# Patient Record
Sex: Female | Born: 1950 | Race: Black or African American | Hispanic: No | Smoking: Former smoker
Health system: Southern US, Community
[De-identification: ages and names within clinical notes are randomized; demographics above are authoritative.]

## PROBLEM LIST (undated history)

## (undated) DIAGNOSIS — I2510 Atherosclerotic heart disease of native coronary artery without angina pectoris: Secondary | ICD-10-CM

## (undated) DIAGNOSIS — I5030 Unspecified diastolic (congestive) heart failure: Secondary | ICD-10-CM

## (undated) DIAGNOSIS — I493 Ventricular premature depolarization: Secondary | ICD-10-CM

## (undated) DIAGNOSIS — Z91148 Patient's other noncompliance with medication regimen for other reason: Secondary | ICD-10-CM

## (undated) DIAGNOSIS — G8929 Other chronic pain: Secondary | ICD-10-CM

## (undated) DIAGNOSIS — G459 Transient cerebral ischemic attack, unspecified: Secondary | ICD-10-CM

## (undated) DIAGNOSIS — I119 Hypertensive heart disease without heart failure: Secondary | ICD-10-CM

## (undated) DIAGNOSIS — I209 Angina pectoris, unspecified: Secondary | ICD-10-CM

## (undated) DIAGNOSIS — K219 Gastro-esophageal reflux disease without esophagitis: Secondary | ICD-10-CM

## (undated) DIAGNOSIS — Z860100 Personal history of colon polyps, unspecified: Secondary | ICD-10-CM

## (undated) DIAGNOSIS — T8859XA Other complications of anesthesia, initial encounter: Secondary | ICD-10-CM

## (undated) DIAGNOSIS — U071 COVID-19: Secondary | ICD-10-CM

## (undated) DIAGNOSIS — I5189 Other ill-defined heart diseases: Secondary | ICD-10-CM

## (undated) DIAGNOSIS — N951 Menopausal and female climacteric states: Secondary | ICD-10-CM

## (undated) DIAGNOSIS — Z7902 Long term (current) use of antithrombotics/antiplatelets: Secondary | ICD-10-CM

## (undated) DIAGNOSIS — Z201 Contact with and (suspected) exposure to tuberculosis: Secondary | ICD-10-CM

## (undated) DIAGNOSIS — I700 Atherosclerosis of aorta: Secondary | ICD-10-CM

## (undated) DIAGNOSIS — E785 Hyperlipidemia, unspecified: Secondary | ICD-10-CM

## (undated) DIAGNOSIS — I77810 Thoracic aortic ectasia: Secondary | ICD-10-CM

## (undated) DIAGNOSIS — I10 Essential (primary) hypertension: Secondary | ICD-10-CM

## (undated) DIAGNOSIS — Z9119 Patient's noncompliance with other medical treatment and regimen: Secondary | ICD-10-CM

## (undated) DIAGNOSIS — G4730 Sleep apnea, unspecified: Secondary | ICD-10-CM

## (undated) DIAGNOSIS — N8110 Cystocele, unspecified: Secondary | ICD-10-CM

## (undated) DIAGNOSIS — R7303 Prediabetes: Secondary | ICD-10-CM

## (undated) DIAGNOSIS — I480 Paroxysmal atrial fibrillation: Secondary | ICD-10-CM

## (undated) DIAGNOSIS — Z91199 Patient's noncompliance with other medical treatment and regimen due to unspecified reason: Secondary | ICD-10-CM

## (undated) DIAGNOSIS — R7309 Other abnormal glucose: Secondary | ICD-10-CM

## (undated) DIAGNOSIS — D649 Anemia, unspecified: Secondary | ICD-10-CM

## (undated) DIAGNOSIS — E559 Vitamin D deficiency, unspecified: Secondary | ICD-10-CM

## (undated) DIAGNOSIS — G4733 Obstructive sleep apnea (adult) (pediatric): Secondary | ICD-10-CM

## (undated) HISTORY — DX: Morbid (severe) obesity due to excess calories: E66.01

## (undated) HISTORY — PX: CORONARY ANGIOPLASTY WITH STENT PLACEMENT: SHX49

## (undated) HISTORY — DX: Atherosclerotic heart disease of native coronary artery without angina pectoris: I25.10

## (undated) HISTORY — DX: Hyperlipidemia, unspecified: E78.5

## (undated) HISTORY — DX: COVID-19: U07.1

## (undated) HISTORY — PX: TUBAL LIGATION: SHX77

## (undated) HISTORY — DX: Patient's noncompliance with other medical treatment and regimen due to unspecified reason: Z91.199

## (undated) HISTORY — DX: Essential (primary) hypertension: I10

## (undated) HISTORY — DX: Patient's noncompliance with other medical treatment and regimen: Z91.19

## (undated) HISTORY — DX: Other ill-defined heart diseases: I51.89

## (undated) HISTORY — DX: Menopausal and female climacteric states: N95.1

## (undated) HISTORY — DX: Hypertensive heart disease without heart failure: I11.9

## (undated) HISTORY — DX: Sleep apnea, unspecified: G47.30

---

## 1989-04-30 HISTORY — PX: KNEE ARTHROSCOPY: SHX127

## 2002-08-30 DIAGNOSIS — I219 Acute myocardial infarction, unspecified: Secondary | ICD-10-CM

## 2002-08-30 HISTORY — DX: Acute myocardial infarction, unspecified: I21.9

## 2003-08-24 ENCOUNTER — Other Ambulatory Visit: Payer: Self-pay

## 2003-08-24 DIAGNOSIS — I2510 Atherosclerotic heart disease of native coronary artery without angina pectoris: Secondary | ICD-10-CM

## 2003-08-24 DIAGNOSIS — I214 Non-ST elevation (NSTEMI) myocardial infarction: Secondary | ICD-10-CM

## 2003-08-24 HISTORY — DX: Atherosclerotic heart disease of native coronary artery without angina pectoris: I25.10

## 2003-08-24 HISTORY — DX: Non-ST elevation (NSTEMI) myocardial infarction: I21.4

## 2003-08-25 ENCOUNTER — Other Ambulatory Visit: Payer: Self-pay

## 2003-08-26 ENCOUNTER — Other Ambulatory Visit: Payer: Self-pay

## 2003-08-26 DIAGNOSIS — Z955 Presence of coronary angioplasty implant and graft: Secondary | ICD-10-CM

## 2003-08-26 HISTORY — PX: CORONARY ANGIOPLASTY WITH STENT PLACEMENT: SHX49

## 2003-08-26 HISTORY — PX: LEFT HEART CATH AND CORONARY ANGIOGRAPHY: CATH118249

## 2003-08-26 HISTORY — DX: Presence of coronary angioplasty implant and graft: Z95.5

## 2003-08-29 ENCOUNTER — Other Ambulatory Visit: Payer: Self-pay

## 2003-09-22 ENCOUNTER — Other Ambulatory Visit: Payer: Self-pay

## 2003-09-23 ENCOUNTER — Other Ambulatory Visit: Payer: Self-pay

## 2003-09-23 HISTORY — PX: CORONARY ANGIOPLASTY WITH STENT PLACEMENT: SHX49

## 2003-09-23 HISTORY — PX: LEFT HEART CATH AND CORONARY ANGIOGRAPHY: CATH118249

## 2003-09-28 ENCOUNTER — Other Ambulatory Visit: Payer: Self-pay

## 2003-11-06 ENCOUNTER — Other Ambulatory Visit: Payer: Self-pay

## 2004-03-06 ENCOUNTER — Other Ambulatory Visit: Payer: Self-pay

## 2004-10-12 ENCOUNTER — Ambulatory Visit: Payer: Self-pay | Admitting: Ophthalmology

## 2005-07-08 ENCOUNTER — Other Ambulatory Visit: Payer: Self-pay

## 2005-07-08 ENCOUNTER — Inpatient Hospital Stay: Payer: Self-pay

## 2005-08-26 ENCOUNTER — Ambulatory Visit: Payer: Self-pay | Admitting: Cardiovascular Disease

## 2005-08-26 HISTORY — PX: LEFT HEART CATH AND CORONARY ANGIOGRAPHY: CATH118249

## 2008-11-01 HISTORY — PX: LEFT HEART CATH AND CORONARY ANGIOGRAPHY: CATH118249

## 2009-02-17 ENCOUNTER — Ambulatory Visit: Payer: Self-pay | Admitting: Cardiovascular Disease

## 2009-02-17 HISTORY — PX: LEFT HEART CATH AND CORONARY ANGIOGRAPHY: CATH118249

## 2010-09-12 ENCOUNTER — Emergency Department: Payer: Self-pay | Admitting: Emergency Medicine

## 2011-08-18 ENCOUNTER — Ambulatory Visit: Payer: Self-pay

## 2014-07-18 ENCOUNTER — Ambulatory Visit: Payer: Self-pay | Admitting: Gastroenterology

## 2014-10-03 ENCOUNTER — Ambulatory Visit: Payer: Self-pay | Admitting: Family Medicine

## 2014-10-03 DIAGNOSIS — Z1231 Encounter for screening mammogram for malignant neoplasm of breast: Secondary | ICD-10-CM | POA: Diagnosis not present

## 2015-02-11 ENCOUNTER — Ambulatory Visit (INDEPENDENT_AMBULATORY_CARE_PROVIDER_SITE_OTHER): Payer: Commercial Managed Care - HMO | Admitting: Family Medicine

## 2015-02-11 ENCOUNTER — Encounter: Payer: Self-pay | Admitting: Family Medicine

## 2015-02-11 VITALS — BP 138/88 | HR 66 | Temp 98.6°F | Ht 62.4 in | Wt 227.0 lb

## 2015-02-11 DIAGNOSIS — N8110 Cystocele, unspecified: Secondary | ICD-10-CM | POA: Diagnosis not present

## 2015-02-11 DIAGNOSIS — I10 Essential (primary) hypertension: Secondary | ICD-10-CM

## 2015-02-11 NOTE — Progress Notes (Signed)
   BP 138/88 mmHg  Pulse 66  Temp(Src) 98.6 F (37 C)  Ht 5' 2.4" (1.585 m)  Wt 227 lb (102.967 kg)  BMI 40.99 kg/m2  SpO2 98%   Subjective:    Patient ID: [name], female    DOB: Mar 26, 1951, 64 y.o.   MRN: [id number]  HPI: MELLINA KASSAY is a 64 y.o. female  Chief Complaint  Patient presents with  . bladder protruding   no UTI sx Ongoing all year Sticking out more And gets uncomfortable Discussed HTN stable with meds Relevant past medical, surgical, family and social history reviewed and updated as indicated. Interim medical history since our last visit reviewed. Allergies and medications reviewed and updated.  Review of Systems  Constitutional: Negative.   Respiratory: Negative.   Cardiovascular: Negative.     Per HPI unless specifically indicated above     Objective:    BP 138/88 mmHg  Pulse 66  Temp(Src) 98.6 F (37 C)  Ht 5' 2.4" (1.585 m)  Wt 227 lb (102.967 kg)  BMI 40.99 kg/m2  SpO2 98%  Wt Readings from Last 3 Encounters:  02/11/15 227 lb (102.967 kg)  09/17/14 223 lb (101.152 kg)    Physical Exam  Constitutional: She is oriented to person, place, and time. She appears well-developed and well-nourished. No distress.  HENT:  Head: Normocephalic and atraumatic.  Right Ear: Hearing normal.  Left Ear: Hearing normal.  Nose: Nose normal.  Eyes: Conjunctivae and lids are normal. Right eye exhibits no discharge. Left eye exhibits no discharge. No scleral icterus.  Pulmonary/Chest: Effort normal. No respiratory distress.  Genitourinary: No erythema in the vagina.  Prolapsed bladder  Musculoskeletal: Normal range of motion.  Neurological: She is alert and oriented to person, place, and time.  Skin: Skin is intact. No rash noted.  Psychiatric: She has a normal mood and affect. Her speech is normal and behavior is normal. Judgment and thought content normal. Cognition and memory are normal.    No results found for this or any previous visit.    Assessment & Plan:   Problem List Items Addressed This Visit      Cardiovascular and Mediastinum   Hypertension    .Marland KitchenMarland KitchenMarland KitchenThe current medical regimen is effective;  continue present plan and medications.         Genitourinary   Bladder prolapse, female, acquired - Primary    Discuss bladder care and tx Due to distress causing pt will make referral to Gyn      Relevant Orders   Ambulatory referral to Gynecology       Follow up plan: Return for Physical Exam.

## 2015-02-11 NOTE — Assessment & Plan Note (Signed)
Discuss bladder care and tx Due to distress causing pt will make referral to [facility]

## 2015-02-11 NOTE — Assessment & Plan Note (Signed)
The current medical regimen is effective;  continue present plan and medications.  

## 2015-02-20 DIAGNOSIS — H2513 Age-related nuclear cataract, bilateral: Secondary | ICD-10-CM | POA: Diagnosis not present

## 2015-02-20 DIAGNOSIS — H349 Unspecified retinal vascular occlusion: Secondary | ICD-10-CM | POA: Diagnosis not present

## 2015-02-27 ENCOUNTER — Encounter: Payer: Self-pay | Admitting: Family Medicine

## 2015-03-13 ENCOUNTER — Ambulatory Visit (INDEPENDENT_AMBULATORY_CARE_PROVIDER_SITE_OTHER): Payer: Commercial Managed Care - HMO | Admitting: Obstetrics and Gynecology

## 2015-03-13 ENCOUNTER — Encounter: Payer: Self-pay | Admitting: Obstetrics and Gynecology

## 2015-03-13 VITALS — BP 134/88 | HR 73 | Ht 63.0 in | Wt 223.1 lb

## 2015-03-13 DIAGNOSIS — N8110 Cystocele, unspecified: Secondary | ICD-10-CM | POA: Diagnosis not present

## 2015-03-14 DIAGNOSIS — N8110 Cystocele, unspecified: Secondary | ICD-10-CM | POA: Insufficient documentation

## 2015-03-14 NOTE — Progress Notes (Signed)
Subjective:    Jennifer Grant is a 64 y.o. [id number] postmenopausal female who presents for evaluation of a cystocele. [facility]. Problem started 5 years ago.   Symptoms include: prolapse of tissue with straining and feeling a bulge in the vagina. Symptoms have gradually worsened.  Is currently not sexually active, however is considering becoming active at some point with new partner.  Denies urinary symptoms or incontinence.  Denies constipation or problems with defecation.    Past Medical History  Diagnosis Date  . Hyperlipidemia   . Hypertension   . CAD (coronary artery disease)   . Mitral valve disorder   . Menopausal state      Past Surgical History  Procedure Laterality Date  . Tubal ligation    . Knee surgery Left     orthoscopic  . Coronary artery bypass graft  6 stents   History  Substance Use Topics  . Smoking status: Never Smoker   . Smokeless tobacco: Never Used  . Alcohol Use: No   Current Outpatient Prescriptions on File Prior to Visit  Medication Sig Dispense Refill  . amLODipine (NORVASC) 10 MG tablet Take 10 mg by mouth daily.    Marland Kitchen. aspirin EC 81 MG tablet Take 81 mg by mouth daily.    . cephALEXin (KEFLEX) 500 MG capsule     . co-enzyme Q-10 30 MG capsule Take 30 mg by mouth daily.    . DHA-EPA-Vitamin E (OMEGA-3 COMPLEX PO) Take by mouth.    Marland Kitchen. FLAXSEED, LINSEED, PO Take by mouth daily.    . hydrochlorothiazide (HYDRODIURIL) 25 MG tablet Take 25 mg by mouth daily.    Marland Kitchen. losartan (COZAAR) 100 MG tablet Take 100 mg by mouth daily.    . metoprolol succinate (TOPROL-XL) 25 MG 24 hr tablet Take 25 mg by mouth daily.    . potassium chloride (K-DUR) 10 MEQ tablet Take 10 mEq by mouth daily.    . pravastatin (PRAVACHOL) 20 MG tablet Take 20 mg by mouth daily.     No current facility-administered medications on file prior to visit.   Allergies  Allergen Reactions  . Other Other (See Comments)    Uncoded Allergy. Allergen: pcn  .  Penicillins Rash  . Tekturna [Aliskiren] Rash     Review of Systems Pertinent items are noted in HPI.   Objective:     BP 134/88 mmHg  Pulse 73  Ht 5\' 3"  (1.6 m)  Wt 223 lb 1.6 oz (101.197 kg)  BMI 39.53 kg/m2. No LMP recorded. Patient is not currently having periods (Reason: Premenopausal).   General:  General appearance: alert and no distress  Abdomen: Abdomen: soft, non-tender; bowel sounds normal; no masses,  no organomegaly  Pelvis:  Pelvic: external genitalia normal, vagina normal without discharge and vaginal vault prolapse with moderate cystocele present.Cervix normal appearing. Uterus mobile, intact, without descensus.  Mild vaginal atrophy present     Assessment:    The patient has a moderate cystocele without uterine descensus     Plan:    Discussed cystoceles and management options with the patient, including conservative management with pessary vs surgical management with anterior repair.  Patient currently unsure of desires. Patient to f/u in 2 weeks to further discuss options.  All questions answered. Agricultural engineerducational material distributed. Recommended Kegal exercises.   Hildred LaserAnika Raynaldo Falco, MD [facility]

## 2015-03-27 ENCOUNTER — Ambulatory Visit: Payer: Commercial Managed Care - HMO | Admitting: Obstetrics and Gynecology

## 2015-04-08 ENCOUNTER — Ambulatory Visit: Payer: Commercial Managed Care - HMO | Admitting: Obstetrics and Gynecology

## 2015-04-18 ENCOUNTER — Encounter: Payer: Self-pay | Admitting: Obstetrics and Gynecology

## 2015-04-18 ENCOUNTER — Ambulatory Visit (INDEPENDENT_AMBULATORY_CARE_PROVIDER_SITE_OTHER): Payer: Commercial Managed Care - HMO | Admitting: Obstetrics and Gynecology

## 2015-04-18 VITALS — BP 173/84 | HR 67 | Ht 62.0 in | Wt 227.3 lb

## 2015-04-18 DIAGNOSIS — N8110 Cystocele, unspecified: Secondary | ICD-10-CM | POA: Diagnosis not present

## 2015-04-18 NOTE — Progress Notes (Signed)
Patient ID: Jennifer Grant, female   DOB: [birth year], 64 y.o.   MRN: [id number] 4 week f/u on cystocele Pt wants non surgical intervention

## 2015-04-19 NOTE — Progress Notes (Signed)
GYNECOLOGY PROGRESS NOTE  Subjective:    Patient ID: Jennifer Grant, female    DOB: 1950-11-16, 64 y.o.   MRN: [id number]  HPI  Patient is a 64 y.o. No obstetric history on file. female who presents for f/u of cystocele.  Patient given options of surgery vs pessary for management of cystocele last visit, desired to think over options.   The following portions of the patient's history were reviewed and updated as appropriate: allergies, current medications, past family history, past medical history, past social history, past surgical history and problem list.  Review of Systems A comprehensive review of systems was negative except for: Genitourinary: positive for vaginal bulge   Objective:   Blood pressure 173/84, pulse 67, height  (1.575 m), weight 227 lb 4.8 oz (103.103 kg). General appearance: alert and no distress Exam deferred.    Assessment:   Cystocele without uterine prolapse  Plan:   Patient desires to try pessary management first.  All questions answered.  Offered to perform pessary fitting today, patient declined, desires to schedule for future visit.  RTC in 2-3 weeks for pessary fitting.    Hildred Laser, MD [facility]

## 2015-05-08 ENCOUNTER — Ambulatory Visit (INDEPENDENT_AMBULATORY_CARE_PROVIDER_SITE_OTHER): Payer: Commercial Managed Care - HMO | Admitting: Obstetrics and Gynecology

## 2015-05-08 ENCOUNTER — Other Ambulatory Visit: Payer: Self-pay | Admitting: Family Medicine

## 2015-05-08 ENCOUNTER — Encounter: Payer: Self-pay | Admitting: Obstetrics and Gynecology

## 2015-05-08 VITALS — BP 152/87 | HR 61 | Ht 62.0 in | Wt 225.9 lb

## 2015-05-08 DIAGNOSIS — IMO0001 Reserved for inherently not codable concepts without codable children: Secondary | ICD-10-CM

## 2015-05-08 DIAGNOSIS — N8110 Cystocele, unspecified: Secondary | ICD-10-CM | POA: Diagnosis not present

## 2015-05-08 DIAGNOSIS — Z4689 Encounter for fitting and adjustment of other specified devices: Secondary | ICD-10-CM

## 2015-05-08 DIAGNOSIS — IMO0002 Reserved for concepts with insufficient information to code with codable children: Principal | ICD-10-CM

## 2015-05-08 NOTE — Progress Notes (Signed)
GYNECOLOGY PROGRESS NOTE  Subjective:    Patient ID: Jennifer Grant, female    DOB: 1951-05-22, 64 y.o.   MRN: [id number]  HPI  Patient is a 64 y.o. female who presents for vaginal pessary fitting for management of cystocele.  Denies major complaints today.   The following portions of the patient's history were reviewed and updated as appropriate: allergies, current medications, past family history, past medical history, past social history, past surgical history and problem list.  Review of Systems A comprehensive review of systems was negative except for: Genitourinary: positive for vaginal bulge   Objective:   Blood pressure 152/87, pulse 61, height  (1.575 m), weight 225 lb 14.4 oz (102.468 kg). General appearance: alert and no distress Abdomen: soft, non-tender; bowel sounds normal; no masses,  no organomegaly Pelvic: external genitalia normal, vagina normal without discharge and vaginal vault prolapse with moderate cystocele present.Cervix normal appearing. Uterus mobile, intact, without descensus. Mild vaginal atrophy present   Assessment:   Moderate cystocele  Plan:   Pessary fitting performed today.  Size 3 ring with support ordered.  RTC in 2-3 weeks for pessary insertion.   Hildred Laser, MD [facility]

## 2015-05-22 ENCOUNTER — Ambulatory Visit: Payer: Commercial Managed Care - HMO | Admitting: Obstetrics and Gynecology

## 2015-06-08 ENCOUNTER — Encounter (HOSPITAL_COMMUNITY)
Admission: EM | Disposition: A | Discharge status: Home / Self Care | Payer: Self-pay | Source: Home / Self Care | Attending: Interventional Cardiology

## 2015-06-08 ENCOUNTER — Encounter (HOSPITAL_COMMUNITY): Payer: Self-pay | Admitting: *Deleted

## 2015-06-08 ENCOUNTER — Inpatient Hospital Stay (HOSPITAL_COMMUNITY)
Admission: EM | Admit: 2015-06-08 | Discharge: 2015-06-11 | DRG: 246 | Disposition: A | Discharge status: Home / Self Care | Payer: Commercial Managed Care - HMO | Attending: Interventional Cardiology | Admitting: Interventional Cardiology

## 2015-06-08 DIAGNOSIS — I10 Essential (primary) hypertension: Secondary | ICD-10-CM

## 2015-06-08 DIAGNOSIS — Z955 Presence of coronary angioplasty implant and graft: Secondary | ICD-10-CM | POA: Diagnosis not present

## 2015-06-08 DIAGNOSIS — Z79899 Other long term (current) drug therapy: Secondary | ICD-10-CM

## 2015-06-08 DIAGNOSIS — D649 Anemia, unspecified: Secondary | ICD-10-CM | POA: Diagnosis not present

## 2015-06-08 DIAGNOSIS — Z951 Presence of aortocoronary bypass graft: Secondary | ICD-10-CM

## 2015-06-08 DIAGNOSIS — Y848 Other medical procedures as the cause of abnormal reaction of the patient, or of later complication, without mention of misadventure at the time of the procedure: Secondary | ICD-10-CM | POA: Diagnosis present

## 2015-06-08 DIAGNOSIS — I509 Heart failure, unspecified: Secondary | ICD-10-CM | POA: Diagnosis not present

## 2015-06-08 DIAGNOSIS — R7303 Prediabetes: Secondary | ICD-10-CM | POA: Diagnosis present

## 2015-06-08 DIAGNOSIS — E876 Hypokalemia: Secondary | ICD-10-CM | POA: Diagnosis present

## 2015-06-08 DIAGNOSIS — I2129 ST elevation (STEMI) myocardial infarction involving other sites: Secondary | ICD-10-CM | POA: Diagnosis not present

## 2015-06-08 DIAGNOSIS — R079 Chest pain, unspecified: Secondary | ICD-10-CM | POA: Diagnosis not present

## 2015-06-08 DIAGNOSIS — I110 Hypertensive heart disease with heart failure: Secondary | ICD-10-CM | POA: Diagnosis present

## 2015-06-08 DIAGNOSIS — Z9119 Patient's noncompliance with other medical treatment and regimen: Secondary | ICD-10-CM | POA: Diagnosis not present

## 2015-06-08 DIAGNOSIS — I2510 Atherosclerotic heart disease of native coronary artery without angina pectoris: Secondary | ICD-10-CM | POA: Diagnosis not present

## 2015-06-08 DIAGNOSIS — Z6841 Body Mass Index (BMI) 40.0 and over, adult: Secondary | ICD-10-CM | POA: Diagnosis not present

## 2015-06-08 DIAGNOSIS — Z91148 Patient's other noncompliance with medication regimen for other reason: Secondary | ICD-10-CM

## 2015-06-08 DIAGNOSIS — Z9114 Patient's other noncompliance with medication regimen: Secondary | ICD-10-CM

## 2015-06-08 DIAGNOSIS — E785 Hyperlipidemia, unspecified: Secondary | ICD-10-CM | POA: Diagnosis not present

## 2015-06-08 DIAGNOSIS — I5033 Acute on chronic diastolic (congestive) heart failure: Secondary | ICD-10-CM

## 2015-06-08 DIAGNOSIS — T82855A Stenosis of coronary artery stent, initial encounter: Secondary | ICD-10-CM | POA: Diagnosis not present

## 2015-06-08 DIAGNOSIS — I2121 ST elevation (STEMI) myocardial infarction involving left circumflex coronary artery: Secondary | ICD-10-CM | POA: Diagnosis present

## 2015-06-08 DIAGNOSIS — I252 Old myocardial infarction: Secondary | ICD-10-CM | POA: Diagnosis present

## 2015-06-08 DIAGNOSIS — I25118 Atherosclerotic heart disease of native coronary artery with other forms of angina pectoris: Secondary | ICD-10-CM | POA: Diagnosis present

## 2015-06-08 HISTORY — PX: CARDIAC CATHETERIZATION: SHX172

## 2015-06-08 HISTORY — DX: ST elevation (STEMI) myocardial infarction involving other sites: I21.29

## 2015-06-08 LAB — POCT I-STAT, CHEM 8
BUN: 8 mg/dL (ref 6–20)
BUN: 7 mg/dL (ref 6–20)
CREATININE: 0.6 mg/dL (ref 0.44–1.00)
Calcium, Ion: 1.32 mmol/L — ABNORMAL HIGH (ref 1.13–1.30)
Calcium, Ion: 1.21 mmol/L (ref 1.13–1.30)
Chloride: 99 mmol/L — ABNORMAL LOW (ref 101–111)
Chloride: 99 mmol/L — ABNORMAL LOW (ref 101–111)
Creatinine, Ser: 0.7 mg/dL (ref 0.44–1.00)
GLUCOSE: 114 mg/dL — AB (ref 65–99)
Glucose, Bld: 140 mg/dL — ABNORMAL HIGH (ref 65–99)
HEMATOCRIT: 41 % (ref 36.0–46.0)
HEMATOCRIT: 39 % (ref 36.0–46.0)
HEMOGLOBIN: 13.9 g/dL (ref 12.0–15.0)
HEMOGLOBIN: 13.3 g/dL (ref 12.0–15.0)
POTASSIUM: 2.9 mmol/L — AB (ref 3.5–5.1)
POTASSIUM: 3.3 mmol/L — AB (ref 3.5–5.1)
SODIUM: 139 mmol/L (ref 135–145)
Sodium: 138 mmol/L (ref 135–145)
TCO2: 29 mmol/L (ref 0–100)
TCO2: 26 mmol/L (ref 0–100)

## 2015-06-08 LAB — CBC WITH DIFFERENTIAL/PLATELET
BASOS ABS: 0 10*3/uL (ref 0.0–0.1)
Basophils Relative: 0 %
EOS ABS: 0 10*3/uL (ref 0.0–0.7)
Eosinophils Relative: 0 %
HEMATOCRIT: 43.7 % (ref 36.0–46.0)
HEMOGLOBIN: 14.1 g/dL (ref 12.0–15.0)
Lymphocytes Relative: 19 %
Lymphs Abs: 1.7 10*3/uL (ref 0.7–4.0)
MCH: 25.9 pg — ABNORMAL LOW (ref 26.0–34.0)
MCHC: 32.3 g/dL (ref 30.0–36.0)
MCV: 80.2 fL (ref 78.0–100.0)
MONO ABS: 0.3 10*3/uL (ref 0.1–1.0)
MONOS PCT: 3 %
NEUTROS ABS: 6.9 10*3/uL (ref 1.7–7.7)
NEUTROS PCT: 78 %
Platelets: 276 10*3/uL (ref 150–400)
RBC: 5.45 MIL/uL — ABNORMAL HIGH (ref 3.87–5.11)
RDW: 15.5 % (ref 11.5–15.5)
WBC: 8.9 10*3/uL (ref 4.0–10.5)

## 2015-06-08 LAB — COMPREHENSIVE METABOLIC PANEL
ALK PHOS: 63 U/L (ref 38–126)
ALT: 43 U/L (ref 14–54)
AST: 190 U/L — AB (ref 15–41)
Albumin: 3.5 g/dL (ref 3.5–5.0)
Anion gap: 12 (ref 5–15)
BILIRUBIN TOTAL: 0.6 mg/dL (ref 0.3–1.2)
BUN: 7 mg/dL (ref 6–20)
CHLORIDE: 97 mmol/L — AB (ref 101–111)
CO2: 26 mmol/L (ref 22–32)
Calcium: 9.4 mg/dL (ref 8.9–10.3)
Creatinine, Ser: 0.77 mg/dL (ref 0.44–1.00)
GFR calc Af Amer: >60 mL/min (ref >60)
GLUCOSE: 93 mg/dL (ref 65–99)
Potassium: 3.5 mmol/L (ref 3.5–5.1)
Sodium: 135 mmol/L (ref 135–145)
Total Protein: 8.2 g/dL — ABNORMAL HIGH (ref 6.5–8.1)

## 2015-06-08 LAB — TSH: TSH: 1.351 u[IU]/mL (ref 0.350–4.500)

## 2015-06-08 LAB — BRAIN NATRIURETIC PEPTIDE: B NATRIURETIC PEPTIDE 5: 34.1 pg/mL (ref 0.0–100.0)

## 2015-06-08 LAB — TROPONIN I

## 2015-06-08 LAB — POCT ACTIVATED CLOTTING TIME
ACTIVATED CLOTTING TIME: 202 s
ACTIVATED CLOTTING TIME: 411 s

## 2015-06-08 LAB — MRSA PCR SCREENING: MRSA BY PCR: NEGATIVE

## 2015-06-08 LAB — T4, FREE: FREE T4: 0.76 ng/dL (ref 0.61–1.12)

## 2015-06-08 LAB — MAGNESIUM: Magnesium: 2 mg/dL (ref 1.7–2.4)

## 2015-06-08 SURGERY — LEFT HEART CATH AND CORONARY ANGIOGRAPHY

## 2015-06-08 MED ORDER — OXYCODONE-ACETAMINOPHEN 5-325 MG PO TABS
1.0000 | ORAL_TABLET | ORAL | Status: DC | PRN
  Administered 2015-06-08 – 2015-06-09 (×2): 1 via ORAL
  Filled 2015-06-08 (×2): qty 1

## 2015-06-08 MED ORDER — HYDROCHLOROTHIAZIDE 25 MG PO TABS: 12.5000 mg | ORAL_TABLET | Freq: Every day | ORAL | Status: DC

## 2015-06-08 MED ORDER — VERAPAMIL HCL 2.5 MG/ML IV SOLN
INTRAVENOUS | Status: AC
  Filled 2015-06-08: qty 2

## 2015-06-08 MED ORDER — SODIUM CHLORIDE 0.9 % IJ SOLN
3.0000 mL | Freq: Two times a day (BID) | INTRAMUSCULAR | Status: DC
  Administered 2015-06-09: 09:00:00 via INTRAVENOUS

## 2015-06-08 MED ORDER — LOSARTAN POTASSIUM 50 MG PO TABS: 100.0000 mg | ORAL_TABLET | Freq: Every day | ORAL | Status: DC

## 2015-06-08 MED ORDER — NITROGLYCERIN 1 MG/10 ML FOR IR/CATH LAB
INTRA_ARTERIAL | Status: DC | PRN
  Administered 2015-06-08 (×2): 200 ug via INTRACORONARY

## 2015-06-08 MED ORDER — METOPROLOL TARTRATE 12.5 MG HALF TABLET
12.5000 mg | ORAL_TABLET | Freq: Two times a day (BID) | ORAL | Status: DC
  Administered 2015-06-08 – 2015-06-09 (×3): 12.5 mg via ORAL
  Filled 2015-06-08 (×3): qty 1

## 2015-06-08 MED ORDER — ENOXAPARIN SODIUM 40 MG/0.4ML ~~LOC~~ SOLN
40.0000 mg | SUBCUTANEOUS | Status: DC
Start: 2015-06-09 — End: 2015-06-11
  Administered 2015-06-09 – 2015-06-11 (×3): 40 mg via SUBCUTANEOUS
  Filled 2015-06-08 (×3): qty 0.4

## 2015-06-08 MED ORDER — SODIUM CHLORIDE 0.9 % IV SOLN
250.0000 mg | INTRAVENOUS | Status: DC | PRN
  Administered 2015-06-08 (×2): 1.75 mg/kg/h via INTRAVENOUS

## 2015-06-08 MED ORDER — SODIUM CHLORIDE 0.9 % IV SOLN
INTRAVENOUS | Status: AC
  Administered 2015-06-08: 18:00:00 via INTRAVENOUS

## 2015-06-08 MED ORDER — ATORVASTATIN CALCIUM 80 MG PO TABS
80.0000 mg | ORAL_TABLET | Freq: Every day | ORAL | Status: DC
  Administered 2015-06-08 – 2015-06-10 (×3): 80 mg via ORAL
  Filled 2015-06-08 (×3): qty 1

## 2015-06-08 MED ORDER — BIVALIRUDIN BOLUS VIA INFUSION
0.1000 mg/kg | Freq: Once | INTRAVENOUS | Status: DC
Start: 2015-06-08 — End: 2015-06-09
  Filled 2015-06-08: qty 11

## 2015-06-08 MED ORDER — ATORVASTATIN CALCIUM 80 MG PO TABS
80.0000 mg | ORAL_TABLET | Freq: Every day | ORAL | Status: DC
Start: 2015-06-08 — End: 2015-06-08

## 2015-06-08 MED ORDER — TICAGRELOR 90 MG PO TABS
90.0000 mg | ORAL_TABLET | Freq: Two times a day (BID) | ORAL | Status: DC
  Administered 2015-06-08 – 2015-06-11 (×6): 90 mg via ORAL
  Filled 2015-06-08 (×6): qty 1

## 2015-06-08 MED ORDER — SODIUM CHLORIDE 0.9 % IV SOLN
INTRAVENOUS | Status: DC | PRN
  Administered 2015-06-08: 102 mL/h via INTRAVENOUS

## 2015-06-08 MED ORDER — NITROGLYCERIN IN D5W 200-5 MCG/ML-% IV SOLN
0.0000 ug/min | INTRAVENOUS | Status: DC
  Administered 2015-06-08: 10 ug/min via INTRAVENOUS
  Filled 2015-06-08: qty 250

## 2015-06-08 MED ORDER — SODIUM CHLORIDE 0.9 % WEIGHT BASED INFUSION: 3.0000 mL/kg/h | INTRAVENOUS | Status: DC

## 2015-06-08 MED ORDER — AMLODIPINE BESYLATE 5 MG PO TABS: 10.0000 mg | ORAL_TABLET | Freq: Every day | ORAL | Status: DC

## 2015-06-08 MED ORDER — SODIUM CHLORIDE 0.9 % IV SOLN
1.7500 mg/kg/h | INTRAVENOUS | Status: AC
  Filled 2015-06-08: qty 250

## 2015-06-08 MED ORDER — LIDOCAINE HCL (PF) 1 % IJ SOLN
INTRAMUSCULAR | Status: AC
  Filled 2015-06-08: qty 30

## 2015-06-08 MED ORDER — ACETAMINOPHEN 325 MG PO TABS: 650.0000 mg | ORAL_TABLET | ORAL | Status: DC | PRN

## 2015-06-08 MED ORDER — FENTANYL CITRATE (PF) 100 MCG/2ML IJ SOLN
INTRAMUSCULAR | Status: DC | PRN
  Administered 2015-06-08: 25 ug via INTRAVENOUS
  Administered 2015-06-08: 50 ug via INTRAVENOUS

## 2015-06-08 MED ORDER — SODIUM CHLORIDE 0.9 % IV SOLN
250.0000 mL | INTRAVENOUS | Status: DC | PRN
  Administered 2015-06-08: 250 mL via INTRAVENOUS

## 2015-06-08 MED ORDER — MIDAZOLAM HCL 2 MG/2ML IJ SOLN
INTRAMUSCULAR | Status: DC | PRN
  Administered 2015-06-08 (×2): 1 mg via INTRAVENOUS

## 2015-06-08 MED ORDER — VERAPAMIL HCL 2.5 MG/ML IV SOLN
INTRAVENOUS | Status: DC | PRN
  Administered 2015-06-08: 150 ug via INTRACORONARY

## 2015-06-08 MED ORDER — TICAGRELOR 90 MG PO TABS
ORAL_TABLET | ORAL | Status: DC | PRN
  Administered 2015-06-08: 180 mg via ORAL

## 2015-06-08 MED ORDER — NITROGLYCERIN 1 MG/10 ML FOR IR/CATH LAB
INTRA_ARTERIAL | Status: AC
  Filled 2015-06-08: qty 10

## 2015-06-08 MED ORDER — METOPROLOL SUCCINATE ER 25 MG PO TB24: 25.0000 mg | ORAL_TABLET | Freq: Every day | ORAL | Status: DC

## 2015-06-08 MED ORDER — BIVALIRUDIN 250 MG IV SOLR
INTRAVENOUS | Status: AC
  Filled 2015-06-08: qty 250

## 2015-06-08 MED ORDER — FENTANYL CITRATE (PF) 100 MCG/2ML IJ SOLN
INTRAMUSCULAR | Status: AC
  Filled 2015-06-08: qty 4

## 2015-06-08 MED ORDER — POTASSIUM CHLORIDE 20 MEQ/15ML (10%) PO SOLN
ORAL | Status: AC
  Filled 2015-06-08: qty 30

## 2015-06-08 MED ORDER — SODIUM CHLORIDE 0.9 % IJ SOLN
3.0000 mL | INTRAMUSCULAR | Status: DC | PRN
  Administered 2015-06-08: 18:00:00 via INTRAVENOUS
  Filled 2015-06-08: qty 3

## 2015-06-08 MED ORDER — HEPARIN SODIUM (PORCINE) 5000 UNIT/ML IJ SOLN
INTRAMUSCULAR | Status: AC
  Administered 2015-06-08: 4000 [IU]
  Filled 2015-06-08: qty 1

## 2015-06-08 MED ORDER — MIDAZOLAM HCL 2 MG/2ML IJ SOLN
INTRAMUSCULAR | Status: AC
  Filled 2015-06-08: qty 4

## 2015-06-08 MED ORDER — NITROGLYCERIN 0.4 MG SL SUBL: 0.4000 mg | SUBLINGUAL_TABLET | SUBLINGUAL | Status: DC | PRN

## 2015-06-08 MED ORDER — POTASSIUM CHLORIDE CRYS ER 20 MEQ PO TBCR
40.0000 meq | EXTENDED_RELEASE_TABLET | Freq: Once | ORAL | Status: AC
  Administered 2015-06-08: 40 meq via ORAL
  Filled 2015-06-08: qty 2

## 2015-06-08 MED ORDER — ASPIRIN 81 MG PO CHEW
81.0000 mg | CHEWABLE_TABLET | Freq: Every day | ORAL | Status: DC
  Administered 2015-06-09 – 2015-06-11 (×3): 81 mg via ORAL
  Filled 2015-06-08 (×3): qty 1

## 2015-06-08 MED ORDER — POTASSIUM CHLORIDE 20 MEQ/15ML (10%) PO SOLN
ORAL | Status: DC | PRN
  Administered 2015-06-08: 40 meq via ORAL

## 2015-06-08 MED ORDER — VERAPAMIL HCL 2.5 MG/ML IV SOLN
INTRAVENOUS | Status: DC | PRN
  Administered 2015-06-08: 10 mL via INTRA_ARTERIAL

## 2015-06-08 MED ORDER — ENOXAPARIN SODIUM 40 MG/0.4ML ~~LOC~~ SOLN: 40.0000 mg | SUBCUTANEOUS | Status: DC

## 2015-06-08 MED ORDER — ONDANSETRON HCL 4 MG/2ML IJ SOLN
4.0000 mg | Freq: Four times a day (QID) | INTRAMUSCULAR | Status: DC | PRN
  Administered 2015-06-08: 4 mg via INTRAVENOUS
  Filled 2015-06-08: qty 2

## 2015-06-08 MED ORDER — LIDOCAINE HCL (PF) 1 % IJ SOLN
INTRAMUSCULAR | Status: DC | PRN
  Administered 2015-06-08: 5 mL

## 2015-06-08 MED ORDER — BIVALIRUDIN BOLUS VIA INFUSION - CUPID
INTRAVENOUS | Status: DC | PRN
  Administered 2015-06-08: 76.5 mg via INTRAVENOUS

## 2015-06-08 MED ORDER — IOHEXOL 350 MG/ML SOLN
INTRAVENOUS | Status: DC | PRN
  Administered 2015-06-08: 295 mL via INTRAVENOUS

## 2015-06-08 SURGICAL SUPPLY — 19 items
BALLN EMERGE MR 2.5X12 (BALLOONS) ×3
BALLN EUPHORA RX 2.0X12 (BALLOONS) ×3
BALLN ~~LOC~~ EUPHORA RX 3.0X12 (BALLOONS) ×3
BALLOON EMERGE MR 2.5X12 (BALLOONS) ×1 IMPLANT
BALLOON EUPHORA RX 2.0X12 (BALLOONS) ×1 IMPLANT
BALLOON ~~LOC~~ EUPHORA RX 3.0X12 (BALLOONS) ×1 IMPLANT
CATH INFINITI JR4 5F (CATHETERS) ×3 IMPLANT
CATH VISTA GUIDE 6FR XB3 (CATHETERS) ×3 IMPLANT
GLIDESHEATH SLEND A-KIT 6F 22G (SHEATH) ×3 IMPLANT
KIT ENCORE 26 ADVANTAGE (KITS) ×3 IMPLANT
KIT HEART LEFT (KITS) ×3 IMPLANT
PACK CARDIAC CATHETERIZATION (CUSTOM PROCEDURE TRAY) ×3 IMPLANT
STENT PROMUS PREM MR 2.75X20 (Permanent Stent) ×3 IMPLANT
STENT PROMUS PREM MR 3.0X8 (Permanent Stent) ×3 IMPLANT
TRANSDUCER W/STOPCOCK (MISCELLANEOUS) ×3 IMPLANT
WIRE ASAHI FIELDER XT 190CM (WIRE) ×3 IMPLANT
WIRE ASAHI PROWATER 180CM (WIRE) ×3 IMPLANT
WIRE HI TORQ VERSACORE-J 145CM (WIRE) ×3 IMPLANT
WIRE SAFE-T 1.5MM-J .035X260CM (WIRE) ×3 IMPLANT

## 2015-06-08 NOTE — Progress Notes (Signed)
06/08/2015 8:54 PM Labs given to Dr. Mayford Knife, and orders received.  Critical Trop >65 given to Dr.Turner also.

## 2015-06-08 NOTE — ED Notes (Signed)
Pt undressed, placed on zoll pads and transported to Cath lab with RN [facility] and cardiologist.

## 2015-06-08 NOTE — H&P (Signed)
Jennifer Grant is a 64 y.o. female  Admit Date: 06/08/2015 Referring Physician: [facility] Primary Cardiologist: Previously Dr. Chancy Milroy, [facility]/ [facility], [facility] first exposure during STEMI Chief complaint / reason for admission: Acute lateral wall ST elevation MI  HPI: 64 year old [place] female who suddenly developed chest discomfort during church service at around 11:30 AM. She was brought immediately to the [facility] emergency room after EKG demonstrated severe ST elevation in the true lateral EKG leads 1 and aVL. In the emergency room she complained of severe 10 of 10 chest pain. She states no prior history of acute infarction but multiple prior stents. She is unable to give any details other than her recollection that 6 stents have been placed. Dr. Chancy Milroy has been her cardiologist. She has not seen him in quite some time. She is not on dual antiplatelet therapy. They is no family history of coronary disease. She denies diabetes and does not smoke.    PMH:    Past Medical History  Diagnosis Date  . Hyperlipidemia   . Hypertension   . CAD (coronary artery disease)   . Mitral valve disorder   . Menopausal state     PSH:    Past Surgical History  Procedure Laterality Date  . Tubal ligation    . Knee surgery Left     orthoscopic  . Coronary artery bypass graft  6 stents   ALLERGIES:   Other; Penicillins; and Tekturna Prior to Admit Meds:   Prescriptions prior to admission  Medication Sig Dispense Refill Last Dose  . amLODipine (NORVASC) 10 MG tablet Take 10 mg by mouth daily.   Taking  . hydrochlorothiazide (HYDRODIURIL) 25 MG tablet Take 25 mg by mouth daily.   Taking  . losartan (COZAAR) 100 MG tablet Take 100 mg by mouth daily.   Taking  . metoprolol succinate (TOPROL-XL) 25 MG 24 hr tablet Take 25 mg by mouth daily.   Taking  . potassium chloride (K-DUR) 10 MEQ tablet Take 10 mEq by mouth daily.   Taking  . [DISCONTINUED] aspirin EC 81 MG tablet Take 81 mg by mouth  daily.   Taking  . [DISCONTINUED] Capsicum, Cayenne, (CAYENNE PO) Take by mouth.   Taking  . [DISCONTINUED] cephALEXin (KEFLEX) 250 MG capsule    Taking  . [DISCONTINUED] co-enzyme Q-10 30 MG capsule Take 30 mg by mouth daily.   Taking  . [DISCONTINUED] DHA-EPA-Vitamin E (OMEGA-3 COMPLEX PO) Take by mouth.   Taking  . [DISCONTINUED] FLAXSEED, LINSEED, PO Take by mouth daily.   Taking  . [DISCONTINUED] Misc Natural Products (GRAPE SEED COMPLEX) CAPS Take by mouth.   Taking  . [DISCONTINUED] pravastatin (PRAVACHOL) 20 MG tablet TAKE ONE TABLET BY MOUTH ONCE DAILY 90 tablet 0    Family HX:    Family History  Problem Relation Age of Onset  . Cancer Neg Hx   . Diabetes Neg Hx   . Heart disease Neg Hx    Social HX:    Social History   Social History  . Marital Status: Married    Spouse Name: N/A  . Number of Children: N/A  . Years of Education: N/A   Occupational History  . Not on file.   Social History Main Topics  . Smoking status: Never Smoker   . Smokeless tobacco: Never Used  . Alcohol Use: No  . Drug Use: No  . Sexual Activity: Yes    Birth Control/ Protection: Surgical   Other Topics Concern  .  Not on file   Social History Narrative     ROS states that she has not had any medication yet today. She denies dyspnea. She has not had blood in her urine and stool. She denies ulcer disease. She is not accompanied by family.  Physical Exam: Blood pressure 118/65, pulse 32, resp. rate 12, height 5' 2"  (1.575 m), SpO2 97 %.    Obese African Heritage female very uncomfortable in the ambulance when I met her on the ramp of the emergency room. She is still wearing her Sunday church close. Skin was dry. No nail bed cyanosis or pallor. Patient is obese. Neck veins are difficult to evaluate. Carotids are 2+ bilateral. Chest is clear to auscultation and percussion. Cardiac exam revealed an S4 gallop but otherwise unremarkable. Abdomen is obese but nontender. Extremities reveal no  edema. Carotid, radial, femoral, and posterior tibial pulses are 2+ and symmetric. Neurologically intact. She speaks with a [place] accent. Somewhat drowsy from morphine.  Labs: Lab Results  Component Value Date   HGB 13.3 06/08/2015   HCT 39.0 06/08/2015    Recent Labs Lab 06/08/15 1512  NA 138  K 3.3*  CL 99*  BUN 7  CREATININE 0.60  GLUCOSE 114*   No results found for: CKTOTAL, CKMB, CKMBINDEX, TROPONINI    Radiology:  No x-ray is yet been performed  EKG:  The tracings provided by EMS reveals sinus rhythm with 2-3 mm of ST elevation in limb lead 1 and aVL with reciprocal inferior ST segment depression in 2, 3, and aVF.  ASSESSMENT: 1. Acute lateral wall ST elevation myocardial infarction 2. History of native coronary artery disease with 6 prior stents, but no data available in terms of location or date of implant. 3. Hypertension 4. Hyperlipidemia 5. Obesity, morbid  Plan:  1. The patient was counseled to undergo heart catheterization with left heart evaluation, coronary angiography, and possible percutaneous coronary intervention with stent implantation. The procedural risks and benefits were discussed in detail. The risks discussed included death, stroke, myocardial infarction, life-threatening bleeding, limb ischemia, kidney injury, allergy, and possible emergency cardiac surgery. The risk of these significant complications occurred less than 1% of the time. After discussion, the patient has agreed to proceed.  2. After stopping briefly in the emergency room we progressed to the cardiac catheterization laboratory after the entire team had arrived. Examined the patient in the emergency room prior to traveling to cath lab.  3. Multiple prior stents increased possibility of acute stent thrombosis.   Sinclair Grooms 06/08/2015 4:13 PM

## 2015-06-08 NOTE — ED Notes (Signed)
Pt clothing and purse sent to cath lab with pt.,

## 2015-06-08 NOTE — ED Notes (Signed)
Patient with approx 45 minutes of CP prior to calling EMS while sitting at church. CP described as central associated with nausea, denies sob. 10/10 pain. 170 systolic initially, and dropped to 109 after 1 nitro. Patient also given of fentanyl with no relief. 324 ASA.  [id number]. Hx of stent x 6. 20g(R)AC.

## 2015-06-09 ENCOUNTER — Inpatient Hospital Stay (HOSPITAL_COMMUNITY): Payer: Commercial Managed Care - HMO

## 2015-06-09 ENCOUNTER — Encounter (HOSPITAL_COMMUNITY): Payer: Self-pay | Admitting: Interventional Cardiology

## 2015-06-09 DIAGNOSIS — I10 Essential (primary) hypertension: Secondary | ICD-10-CM

## 2015-06-09 DIAGNOSIS — E785 Hyperlipidemia, unspecified: Secondary | ICD-10-CM

## 2015-06-09 DIAGNOSIS — Z9119 Patient's noncompliance with other medical treatment and regimen: Secondary | ICD-10-CM

## 2015-06-09 DIAGNOSIS — I509 Heart failure, unspecified: Secondary | ICD-10-CM

## 2015-06-09 LAB — CBC
HCT: 36.2 % (ref 36.0–46.0)
Hemoglobin: 11.6 g/dL — ABNORMAL LOW (ref 12.0–15.0)
MCH: 25.7 pg — ABNORMAL LOW (ref 26.0–34.0)
MCHC: 32 g/dL (ref 30.0–36.0)
MCV: 80.1 fL (ref 78.0–100.0)
PLATELETS: 200 10*3/uL (ref 150–400)
RBC: 4.52 MIL/uL (ref 3.87–5.11)
RDW: 15.6 % — AB (ref 11.5–15.5)
WBC: 7.3 10*3/uL (ref 4.0–10.5)

## 2015-06-09 LAB — TROPONIN I: Troponin I: >65 ng/mL (ref <0.031)

## 2015-06-09 LAB — BASIC METABOLIC PANEL
Anion gap: 8 (ref 5–15)
BUN: 8 mg/dL (ref 6–20)
CO2: 25 mmol/L (ref 22–32)
CREATININE: 0.82 mg/dL (ref 0.44–1.00)
Calcium: 9.4 mg/dL (ref 8.9–10.3)
Chloride: 103 mmol/L (ref 101–111)
Glucose, Bld: 127 mg/dL — ABNORMAL HIGH (ref 65–99)
Potassium: 3.8 mmol/L (ref 3.5–5.1)
SODIUM: 136 mmol/L (ref 135–145)

## 2015-06-09 LAB — LIPID PANEL
CHOL/HDL RATIO: 6.3 ratio
Cholesterol: 213 mg/dL — ABNORMAL HIGH (ref 0–200)
HDL: 34 mg/dL — ABNORMAL LOW (ref >40)
LDL Cholesterol: 160 mg/dL — ABNORMAL HIGH (ref 0–99)
Triglycerides: 97 mg/dL (ref <150)
VLDL: 19 mg/dL (ref 0–40)

## 2015-06-09 LAB — HEMOGLOBIN A1C
Hgb A1c MFr Bld: 6.2 % — ABNORMAL HIGH (ref 4.8–5.6)
MEAN PLASMA GLUCOSE: 131 mg/dL

## 2015-06-09 MED ORDER — ISOSORBIDE MONONITRATE ER 30 MG PO TB24
30.0000 mg | ORAL_TABLET | Freq: Every day | ORAL | Status: DC
  Administered 2015-06-09: 30 mg via ORAL
  Filled 2015-06-09: qty 1

## 2015-06-09 NOTE — Care Management Note (Addendum)
Case Management Note  Patient Details  NameSHAYLE DONAHOOoster MRN: [id number] Date of Birth: [birth year]  Subjective/Objective:         Adm w stemi           Action/Plan: lives w husband   Expected Discharge Date:                  Expected Discharge Plan:     In-House Referral:     Discharge planning Services     Post Acute Care Choice:    Choice offered to:     DME Arranged:    DME Agency:     HH Arranged:    HH Agency:     Status of Service:     Medicare Important Message Given:    Date Medicare IM Given:    Medicare IM give by:    Date Additional Medicare IM Given:    Additional Medicare Important Message give by:     If discussed at Long Length of Stay Meetings, dates discussed:    Additional Comments: ur review done, gave pt 30day free brilinta card. Pt states has [employer] and medicaid so she will have low copay for brilinta.  Hanley Hays, RN 06/09/2015, 8:46 AM

## 2015-06-09 NOTE — Progress Notes (Signed)
CARDIAC REHAB PHASE I   PRE:  Rate/Rhythm: 60 SR with PVCs    BP: sitting 111/76    SaO2: 99 RA  MODE:  Ambulation: 350 ft   POST:  Rate/Rhythm: 85 SR    BP: sitting 122/95     SaO2: 100 RA  Tolerated well, no c/o. Pt sts she feels well. She has been having some sx of something stabbing her in the chest for some time now. Saw her PCP but EKG good. Began ed. Pt receptive, joking. Will need continued ed. Interested in [facility] and will send referral to  [facility]. Will f/u tomorrow. [id number]   Elissa Lovett Hatley CES, ACSM 06/09/2015 10:51 AM

## 2015-06-09 NOTE — Progress Notes (Signed)
  Echocardiogram 2D Echocardiogram has been performed.  Janalyn Harder 06/09/2015, 4:21 PM

## 2015-06-09 NOTE — Progress Notes (Signed)
EKG CRITICAL VALUE     12 lead EKG performed.  Critical value noted.  Harlow Ohms, RN notified.   Mark Benecke H, CCT 06/09/2015 6:56 AM

## 2015-06-09 NOTE — Progress Notes (Addendum)
       Patient Name: AXEL MEAS Date of Encounter: 06/09/2015    SUBJECTIVE: The patient denies any ongoing chest discomfort. She denies dyspnea.  TELEMETRY:  Normal sinus rhythm with frequent ventricular ectopy yesterday which is markedly improved. Filed Vitals:   06/09/15 0800 06/09/15 0832 06/09/15 0900 06/09/15 0904  BP: 117/59  98/73 98/73  Pulse: 58  63 73  Temp:  98.3 F (36.8 C)    TempSrc:  Oral    Resp: 9  13   Height:      Weight:      SpO2: 99%  100%     Intake/Output Summary (Last 24 hours) at 06/09/15 0946 Last data filed at 06/09/15 0900  Gross per 24 hour  Intake 1334.73 ml  Output   1400 ml  Net -65.27 ml   LABS: Basic Metabolic Panel:  Recent Labs  95/28/41 1850 06/09/15 0300  NA 135 136  K 3.5 3.8  CL 97* 103  CO2 26 25  GLUCOSE 93 127*  BUN 7 8  CREATININE 0.77 0.82  CALCIUM 9.4 9.4  MG 2.0  --    CBC:  Recent Labs  06/08/15 1850 06/09/15 0300  WBC 8.9 7.3  NEUTROABS 6.9  --   HGB 14.1 11.6*  HCT 43.7 36.2  MCV 80.2 80.1  PLT 276 200   Cardiac Enzymes:  Recent Labs  06/08/15 1850 06/08/15 2110 06/09/15 0300  TROPONINI >65.00* >65.00* >65.00*   BNP    Component Value Date/Time   BNP 34.1 06/08/2015 1850    Electrocardiogram: Persistent although markedly improved ST elevation in 1 and aVL. Reciprocal ST segment depression in lead 2 and aVF.   Radiology/Studies:  No x-ray data available  Physical Exam: Blood pressure 98/73, pulse 73, temperature 98.3 F (36.8 C), temperature source Oral, resp. rate 13, height  (1.575 m), weight 220 lb 14.4 oz (100.2 kg), SpO2 100 %. Weight change:   Wt Readings from Last 3 Encounters:  06/08/15 220 lb 14.4 oz (100.2 kg)  05/08/15 225 lb 14.4 oz (102.468 kg)  04/18/15 227 lb 4.8 oz (103.103 kg)   Obese and in no distress Heart sounds are distant. No pericardial rub is heard. No murmurs heard. Neck veins are not distended. Lungs are clear.  ASSESSMENT:  1.  Coronary artery disease with history of multiple prior stents, 4 stents placed in December 2004 at [facility] in 2 additional stents placed in 2005 at [facility].. 2. Acute lateral wall myocardial infarction due to late obtuse marginal #1 stent thrombosis.. This was successfully treated with dilatation and restenting. 3. High-grade in-stent restenosis and obtuse marginal #2, treated with angioplasty at the time of acute intervention on the OM1 stent thrombosis. 4. High-grade, multifocal in-stent restenosis in the distal RCA and PDA. 5. Obesity 6. Essential hypertension   Plan:  1. Discontinue IV nitroglycerin 2. Discontinue amlodipine 3. Ambulate 4. Lifelong dual antiplatelet therapy 5. 2-D Doppler echocardiogram 6. Discharge 48-72 hours hence. 7. Will need RCA PCI from femoral approach in several weeks after she demonstrates compliance with medical therapy, especially dual antiplatelet therapy.  Selinda Eon 06/09/2015, 9:46 AM

## 2015-06-10 ENCOUNTER — Inpatient Hospital Stay (HOSPITAL_COMMUNITY): Payer: Commercial Managed Care - HMO

## 2015-06-10 DIAGNOSIS — I5033 Acute on chronic diastolic (congestive) heart failure: Secondary | ICD-10-CM

## 2015-06-10 LAB — BASIC METABOLIC PANEL
ANION GAP: 6 (ref 5–15)
BUN: 9 mg/dL (ref 6–20)
CALCIUM: 8.8 mg/dL — AB (ref 8.9–10.3)
CHLORIDE: 100 mmol/L — AB (ref 101–111)
CO2: 27 mmol/L (ref 22–32)
Creatinine, Ser: 0.74 mg/dL (ref 0.44–1.00)
GFR calc non Af Amer: >60 mL/min (ref >60)
Glucose, Bld: 108 mg/dL — ABNORMAL HIGH (ref 65–99)
Potassium: 3.5 mmol/L (ref 3.5–5.1)
Sodium: 133 mmol/L — ABNORMAL LOW (ref 135–145)

## 2015-06-10 LAB — TROPONIN I
TROPONIN I: 32.09 ng/mL — AB (ref <0.031)
Troponin I: 57.11 ng/mL (ref <0.031)
Troponin I: 34.74 ng/mL (ref <0.031)

## 2015-06-10 MED ORDER — ISOSORBIDE MONONITRATE ER 60 MG PO TB24
60.0000 mg | ORAL_TABLET | Freq: Every day | ORAL | Status: DC
  Administered 2015-06-10 – 2015-06-11 (×2): 60 mg via ORAL
  Filled 2015-06-10 (×2): qty 1

## 2015-06-10 MED ORDER — METOPROLOL TARTRATE 25 MG PO TABS
25.0000 mg | ORAL_TABLET | Freq: Two times a day (BID) | ORAL | Status: DC
  Administered 2015-06-10 – 2015-06-11 (×3): 25 mg via ORAL
  Filled 2015-06-10 (×3): qty 1

## 2015-06-10 NOTE — Progress Notes (Addendum)
Patient Name: Jennifer Grant Date of Encounter: 06/10/2015    SUBJECTIVE: The patient denies any ongoing chest discomfort. She denies dyspnea. She slept well last night.  Regarding cardiology follow-up, she is not had a primary cardiologist in greater than 7 years. She would prefer to follow-up back here in [place].  TELEMETRY:  Normal sinus rhythm with ectopy improved. Filed Vitals:   06/10/15 0100 06/10/15 0430 06/10/15 0719 06/10/15 0724  BP: 137/58 146/71  136/89  Pulse: 57 64 69   Temp: 98.1 F (36.7 C) 98.3 F (36.8 C) 98.9 F (37.2 C)   TempSrc: Oral Oral Oral   Resp:   16   Height:      Weight:      SpO2: 99% 99% 100%     Intake/Output Summary (Last 24 hours) at 06/10/15 0807 Last data filed at 06/09/15 1957  Gross per 24 hour  Intake    776 ml  Output      0 ml  Net    776 ml   LABS: Basic Metabolic Panel:  Recent Labs  08/65/78 1850 06/09/15 0300 06/10/15 0422  NA 135 136 133*  K 3.5 3.8 3.5  CL 97* 103 100*  CO2 GLUCOSE 93 127* 108*  BUN CREATININE 0.77 0.82 0.74  CALCIUM 9.4 9.4 8.8*  MG 2.0  --   --    CBC:  Recent Labs  06/08/15 1850 06/09/15 0300  WBC 8.9 7.3  NEUTROABS 6.9  --   HGB 14.1 11.6*  HCT 43.7 36.2  MCV 80.2 80.1  PLT 276 200   Cardiac Enzymes:  Recent Labs  06/09/15 1525 06/09/15 2107 06/10/15 0422  TROPONINI >65.00* >65.00* 57.11*   BNP    Component Value Date/Time   BNP 34.1 06/08/2015 1850   Echocardiogram: Severe lateral wall hypokinesis. EF preserved at 55%.  Electrocardiogram: Persistentlateral ST elevation.  Radiology/Studies:  No x-ray data available  Physical Exam: Blood pressure 136/89, pulse 69, temperature 98.9 F (37.2 C), temperature source Oral, resp. rate 16, height  (1.575 m), weight 220 lb 14.4 oz (100.2 kg), SpO2 100 %. Weight change:   Wt Readings from Last 3 Encounters:  06/08/15 220 lb 14.4 oz (100.2 kg)  05/08/15 225 lb 14.4 oz (102.468 kg)   04/18/15 227 lb 4.8 oz (103.103 kg)   Obese and in no distress Heart sounds are distant. No pericardial rub is heard. No murmurs heard.An S4 gallop is audible.  Neck veins are difficult to assess  Lungs are clear. Right radial cath site is unremarkable.  ASSESSMENT:  1. Coronary artery disease with history of multiple prior stents, 4 stents placed in December 2004 at [facility] in 2 additional stents placed in 2005 at [facility].. 2. Acute lateral wall myocardial infarction due to late obtuse marginal #1 stent thrombosis.. This was successfully treated with dilatation and restenting.No recurrence of significant angina. There was distal embolization in this territory.  3. High-grade in-stent restenosis and obtuse marginal #2, treated with angioplasty at the time of acute intervention on the OM1 stent thrombosis. 4. High-grade, multifocal in-stent restenosis in the distal RCA and PDA. This will need to be treated at a later date.  5. Obesity 6. Essential hypertension, with blood pressures increasing 7. Acute on chronic diastolic heart failure, no evidence of volume overload.  Plan:  1. Ambulate and transferred to telemetry  2. Lifelong dual antiplatelet therapy 3. Discharge tomorrow if  no complications. 4. Will need RCA PCI from femoral approach in several weeks after she demonstrates compliance with medical therapy, especially dual antiplatelet therapy. 5. Check troponin in a.m. and chest x-ray this a.m.  6. Intensified beta blocker and isosorbide therapy.  Windy Fast W 06/10/2015, 8:07 AM

## 2015-06-10 NOTE — Progress Notes (Signed)
EKG CRITICAL VALUE     12 lead EKG performed.  Critical value noted.  Alyce, RN notified.   Oda Cogan, CCT 06/10/2015 7:21 AM

## 2015-06-10 NOTE — Progress Notes (Signed)
CARDIAC REHAB PHASE I   PRE:  Rate/Rhythm: 70 SR    BP: sitting 130/86    SaO2:   MODE:  Ambulation: 500 ft   POST:  Rate/Rhythm: 102 ST    BP: sitting 150/85     SaO2:   Tolerated well. Somewhat tired after walking. Ed completed, voicing understanding. Discussed elevated A1C and diet changes. Gave videos to watch. She can walk independently. [id number]   Jennifer Grant [place] CES, ACSM 06/10/2015 12:11 PM

## 2015-06-11 ENCOUNTER — Telehealth: Payer: Self-pay | Admitting: Interventional Cardiology

## 2015-06-11 DIAGNOSIS — I5033 Acute on chronic diastolic (congestive) heart failure: Secondary | ICD-10-CM

## 2015-06-11 DIAGNOSIS — I2129 ST elevation (STEMI) myocardial infarction involving other sites: Secondary | ICD-10-CM

## 2015-06-11 DIAGNOSIS — I2510 Atherosclerotic heart disease of native coronary artery without angina pectoris: Secondary | ICD-10-CM

## 2015-06-11 LAB — BASIC METABOLIC PANEL
ANION GAP: 8 (ref 5–15)
BUN: 11 mg/dL (ref 6–20)
CALCIUM: 9 mg/dL (ref 8.9–10.3)
CHLORIDE: 100 mmol/L — AB (ref 101–111)
CO2: 27 mmol/L (ref 22–32)
Creatinine, Ser: 0.82 mg/dL (ref 0.44–1.00)
GFR calc non Af Amer: >60 mL/min (ref >60)
GLUCOSE: 93 mg/dL (ref 65–99)
POTASSIUM: 3.3 mmol/L — AB (ref 3.5–5.1)
Sodium: 135 mmol/L (ref 135–145)

## 2015-06-11 LAB — TROPONIN I: Troponin I: 23.84 ng/mL (ref <0.031)

## 2015-06-11 LAB — HEMOGLOBIN AND HEMATOCRIT, BLOOD
HEMATOCRIT: 35.3 % — AB (ref 36.0–46.0)
Hemoglobin: 11.3 g/dL — ABNORMAL LOW (ref 12.0–15.0)

## 2015-06-11 MED ORDER — POTASSIUM CHLORIDE CRYS ER 20 MEQ PO TBCR
40.0000 meq | EXTENDED_RELEASE_TABLET | Freq: Two times a day (BID) | ORAL | Status: DC
  Administered 2015-06-11: 40 meq via ORAL
  Filled 2015-06-11: qty 2

## 2015-06-11 MED ORDER — METOPROLOL TARTRATE 25 MG PO TABS: 25.0000 mg | ORAL_TABLET | Freq: Two times a day (BID) | ORAL | Status: DC

## 2015-06-11 MED ORDER — ASPIRIN 81 MG PO CHEW: 81.0000 mg | CHEWABLE_TABLET | Freq: Every day | ORAL | Status: DC

## 2015-06-11 MED ORDER — TICAGRELOR 90 MG PO TABS: 90.0000 mg | ORAL_TABLET | Freq: Two times a day (BID) | ORAL | Status: DC

## 2015-06-11 MED ORDER — NITROGLYCERIN 0.4 MG SL SUBL: 0.4000 mg | SUBLINGUAL_TABLET | SUBLINGUAL | Status: DC | PRN

## 2015-06-11 MED ORDER — ATORVASTATIN CALCIUM 80 MG PO TABS: 80.0000 mg | ORAL_TABLET | Freq: Every day | ORAL | Status: DC

## 2015-06-11 MED ORDER — ISOSORBIDE MONONITRATE ER 60 MG PO TB24: 60.0000 mg | ORAL_TABLET | Freq: Every day | ORAL | Status: DC

## 2015-06-11 MED ORDER — POTASSIUM CHLORIDE CRYS ER 20 MEQ PO TBCR: 40.0000 meq | EXTENDED_RELEASE_TABLET | Freq: Two times a day (BID) | ORAL | Status: DC

## 2015-06-11 NOTE — Care Management Note (Addendum)
Case Management Note  Patient Details  Name: Jennifer Grant MRN: [id number] Date of Birth: 10/22/1950  Subjective/Objective:  Pt admitted for Acute lateral wall ST elevation MI. Pt is post cath-plan for d/c home on Brilinta.                   Action/Plan: Pt has 30 day free brilinta card. Pt will need 30 day free card no refills and the original Rx. Per pt her cost will be $3.00. CM did call [facility] [address], [place], [place] [address] Phone: [phone]. Medication is available. No further needs from CM at this time.      Expected Discharge Date:                  Expected Discharge Plan:  Home/Self Care  In-House Referral:     Discharge planning Services  CM Consult, Medication Assistance  Post Acute Care Choice:  NA Choice offered to:  NA  DME Arranged:  N/A DME Agency:  NA  HH Arranged:  NA HH Agency:  NA  Status of Service:  Completed, signed off  Medicare Important Message Given:    Date Medicare IM Given:    Medicare IM give by:    Date Additional Medicare IM Given:    Additional Medicare Important Message give by:     If discussed at Long Length of Stay Meetings, dates discussed:    Additional Comments:  [name], [name], RN 06/11/2015, 10:07 AM

## 2015-06-11 NOTE — Progress Notes (Signed)
Patient Name: Jennifer Grant Date of Encounter: 06/11/2015  Primary Cardiologist: Previously Dr. Park Breed, [place]/ [facility], [facility] first exposure during STEMI   Principal Problem:   Acute ST elevation myocardial infarction (STEMI) of lateral wall (HCC) Active Problems:   Essential hypertension   Hyperlipidemia   Hx of medication noncompliance   Coronary artery disease involving native heart   Acute on chronic diastolic heart failure (HCC)    SUBJECTIVE  Denies any CP or SOB. Ambulated ok last night  CURRENT MEDS . aspirin  81 mg Oral Daily  . atorvastatin  80 mg Oral q1800  . enoxaparin (LOVENOX) injection  40 mg Subcutaneous Q24H  . isosorbide mononitrate  60 mg Oral Daily  . metoprolol tartrate  25 mg Oral BID  . potassium chloride  40 mEq Oral BID  . ticagrelor  90 mg Oral BID    OBJECTIVE  Filed Vitals:   06/10/15 1056 06/10/15 1300 06/10/15 2008 06/11/15 0526  BP: 161/91 140/77 134/80 118/71  Pulse:  60 73 58  Temp:  97.9 F (36.6 C) 98.5 F (36.9 C) 98.6 F (37 C)  TempSrc:  Oral Oral Oral  Resp:  Height:      Weight:    220 lb 14.4 oz (100.2 kg)  SpO2:  100% 100% 100%    Intake/Output Summary (Last 24 hours) at 06/11/15 0836 Last data filed at 06/11/15 0745  Gross per 24 hour  Intake    840 ml  Output      0 ml  Net    840 ml   Filed Weights   06/08/15 1538 06/11/15 0526  Weight: 220 lb 14.4 oz (100.2 kg) 220 lb 14.4 oz (100.2 kg)    PHYSICAL EXAM  General: Pleasant, NAD. Neuro: Alert and oriented X 3. Moves all extremities spontaneously. Psych: Normal affect. HEENT:  Normal  Neck: Supple without bruits or JVD. Lungs:  Resp regular and unlabored, CTA. Heart: RRR no s3, s4, or murmurs. R radial cath site stable, 2+ distal radial pulse.  Abdomen: Soft, non-tender, non-distended, BS + x 4.  Extremities: No clubbing, cyanosis or edema. DP/PT/Radials 2+ and equal bilaterally.  Accessory Clinical Findings  CBC  Recent Labs  06/08/15 1850 06/09/15 0300  WBC 8.9 7.3  NEUTROABS 6.9  --   HGB 14.1 11.6*  HCT 43.7 36.2  MCV 80.2 80.1  PLT 276 200   Basic Metabolic Panel  Recent Labs  06/08/15 1850  06/10/15 0422 06/11/15 0323  NA 135  < > 133* 135  K 3.5  < > 3.5 3.3*  CL 97*  < > 100* 100*  CO2 26  < > 27 27  GLUCOSE 93  < > 108* 93  BUN 7  < > 9 11  CREATININE 0.77  < > 0.74 0.82  CALCIUM 9.4  < > 8.8* 9.0  MG 2.0  --   --   --   < > = values in this interval not displayed. Liver Function Tests  Recent Labs  06/08/15 1850  AST 190*  ALT 43  ALKPHOS 63  BILITOT 0.6  PROT 8.2*  ALBUMIN 3.5   Cardiac Enzymes  Recent Labs  06/10/15 1526 06/10/15 2210 06/11/15 0323  TROPONINI 34.74* 32.09* 23.84*   Hemoglobin A1C  Recent Labs  06/08/15 1850  HGBA1C 6.2*   Fasting Lipid Panel  Recent Labs  06/09/15 1046  CHOL 213*  HDL 34*  LDLCALC 160*  TRIG 97  CHOLHDL 6.3  Thyroid Function Tests  Recent Labs  06/08/15 1850  TSH 1.351    TELE NSR with HR 60s    ECG  No new EKG  Echocardiogram 06/09/2015  LV EF: 50% -  55%  ------------------------------------------------------------------- Indications:   CHF - 428.0.  ------------------------------------------------------------------- History:  PMH: STEMI Lateral wall. Coronary artery disease. Risk factors: Hypertension. Dyslipidemia.  ------------------------------------------------------------------- Study Conclusions  - Left ventricle: Wall thickness was increased in a pattern of severe LVH. Systolic function was normal. The estimated ejection fraction was in the range of 50% to 55%. There is severe hypokinesis of the lateral myocardium. Doppler parameters are consistent with abnormal left ventricular relaxation (grade 1 diastolic dysfunction). - Left atrium: The atrium was mildly dilated. - Right ventricle: Systolic function was mildly reduced.     Radiology/Studies  Dg Chest 2  View  06/10/2015  CLINICAL DATA:  Acute MI, CHF both acute and chronic, nonsmoker. EXAM: CHEST  2 VIEW COMPARISON:  None in PACs FINDINGS: The lungs are borderline hypoinflated but clear. The cardiac silhouette is enlarged. The pulmonary vascularity is not engorged. The mediastinum is normal in width. There is mild tortuosity of the descending thoracic aorta. The bony thorax exhibits no acute abnormality. IMPRESSION: Mild enlargement of the cardiac silhouette without evidence of pulmonary edema. There is no pneumonia. Electronically Signed   By: David  Swazil[place]Jordan M.D.   On: 06/10/2015 09:37    ASSESSMENT AND PLAN  1. Acute lateral STEMI due to late OM1 stent thrombosis  - cath 06/08/2015 EF 45-50%, DES (2.75 x 20 mm long Promus Premier) to occluded OM1, PTCA to in-stent restenosis of OM2, high grade multifocal instent restenosis of distal RCA and PDA to be treated later in several weeks.   - plan for RCA PCI from femoral approach in several weeks after she demonstrate compliance with medical therapy  - if hgb and hct stable, plan for discharge today. No lifting >5 lbs for 1 week. R radial cath site stable with 2+ distal radial pulse  - continue ASA, lipitor, Imdur, metoprolol, Brilinta.    2. CAD s/p 6 prior stents  - [facility] additional [facility]  - Echo 06/09/2015 EF 50-55%, severe hypokinesis of lateral myocardium, grade 1 diastolic dysfunction, severe LVH  3. Hypokalemia: K 3.3, replete today  4. HTN: BP improved from 170s to 120s overnight.   5. HLD: LDL 160, chol 213, HDL 34. Back on statin.   6. Pre-diabetes: A1C 6.2  7. Anemia post cath: hgb went down from 14.1 to 11.6 post cath, will recheck hgb and hct today   Signed, Azalee CourseMeng, Liridona Mashaw PA-C Pager: [phone]

## 2015-06-11 NOTE — Care Management Important Message (Signed)
Important Message  Patient Details  Name: Jennifer Grant MRN: [id number] Date of Birth: 12/01/1950   Medicare Important Message Given:  Yes-second notification given    Orson AloeMegan P Pike Scantlebury 06/11/2015, 1:42 PM

## 2015-06-11 NOTE — Progress Notes (Signed)
CARDIAC REHAB PHASE I   PRE:  Rate/Rhythm: 65 SR    BP: sitting 158/81    SaO2:   MODE:  Ambulation: 650 ft   POST:  Rate/Rhythm: 105 ST    BP: sitting 154/90     SaO2:   Pt ambulated quickly and then decided to run for a few steps at end of walk. Became SOB accompanied with some nausea "felt like I might vomit". Passed once she sat down. Discussed gradually increasing her exercise and not pushing herself too fast. Reminded pt of her ex gl given yesterday and also the importance of CRPII. Voiced understanding.  [id number]   Jennifer LovettReeve, Jennifer Grant Jennifer Grant CES, ACSM 06/11/2015 10:27 AM

## 2015-06-11 NOTE — Discharge Instructions (Signed)
No driving for 24 hours. No lifting over 5 lbs for 1 week. No sexual activity for 1 week. Keep procedure site clean & dry. If you notice increased pain, swelling, bleeding or pus, call/return!  You may shower, but no soaking baths/hot tubs/pools for 1 week.  ° ° °

## 2015-06-11 NOTE — Discharge Summary (Signed)
Discharge Summary   Patient ID: Jennifer Grant,  MRN: [id number], DOB/AGE: 66/10/1950 66 y.o.  Admit date: 06/08/2015 Discharge date: 06/11/2015  Primary Care Provider: No primary care provider on file. Primary Cardiologist: [facility] ([facility])  Discharge Diagnoses Principal Problem:   Acute ST elevation myocardial infarction (STEMI) of lateral wall (HCC) Active Problems:   Essential hypertension   Hyperlipidemia   Hx of medication noncompliance   Coronary artery disease involving native heart   Acute on chronic diastolic heart failure (HCC)   Allergies Allergies  Allergen Reactions  . Latex Other (See Comments)    Gloves make hands look black after prolonged use  . Lipitor [Atorvastatin] Itching    Mouth itching  . Penicillins Itching and Swelling    Tongue itching and lip swelling Has patient had a PCN reaction causing immediate rash, facial/tongue/throat swelling, SOB or lightheadedness with hypotension: yes Has patient had a PCN reaction causing severe rash involving mucus membranes or skin necrosis: No Has patient had a PCN reaction that required hospitalization No Has patient had a PCN reaction occurring within the last 10 years: No If all of the above answers are "NO", then may proceed with Cephalosporin use.  Danie Chandler. Tekturna [Aliskiren] Rash    Procedures  Echocardiogram 06/08/2015 Conclusion     Acute lateral wall infarction due to stent thrombosis in the large first obtuse marginal and subacute occlusion due to restenosis of the second obtuse marginal.  Successful angioplasty and restenting of the first obtuse marginal from 100% to 0% with TIMI grade 3 flow. The very terminal and of the first obtuse marginal suffered distal embolization from the proximal culprit lesion.  Successful angioplasty of in-stent restenosis in the second obtuse marginal from 99% to less than 50% with TIMI grade 3 flow  In-stent restenosis in the distal right  coronary and PDA 70-90% in both areas.  Widely patent stent in the mid circumflex  Eccentric 50 and 75% stenoses in the mid and distal LAD. No prior intervention has been performed on the LAD.  Left ventricular dysfunction with EF 45-50% and wall motion abnormality involving the mid anterior wall. Marked elevation need EP suggests acute combined systolic and diastolic heart failure   Recommendations:   Aggressive risk factor modification to include high intensity statin therapy, aggressive blood pressure control, and cardiac rehabilitation.  Lifelong dual antiplatelet therapy.  Right coronary intervention, staged, to be performed in several weeks. Should be performed from the femoral approach, as the patient has marked tortuosity in subclavian creating an unfavorable access for right coronary intervention.  Beta blocker therapy  IV nitroglycerin  Replete potassium  I will follow as attending on team D starting in a.m.      Cardiac catheterization 06/09/2015 LV EF: 50% -  55%  ------------------------------------------------------------------- Indications:   CHF - 428.0.  ------------------------------------------------------------------- History:  PMH: STEMI Lateral wall. Coronary artery disease. Risk factors: Hypertension. Dyslipidemia.  ------------------------------------------------------------------- Study Conclusions  - Left ventricle: Wall thickness was increased in a pattern of severe LVH. Systolic function was normal. The estimated ejection fraction was in the range of 50% to 55%. There is severe hypokinesis of the lateral myocardium. Doppler parameters are consistent with abnormal left ventricular relaxation (grade 1 diastolic dysfunction). - Left atrium: The atrium was mildly dilated. - Right ventricle: Systolic function was mildly reduced.    Hospital Course  The patient is a 64 year old [place] female with PMH of HTN, HLD and CAD  s/p multiple PCI presented as lateral STEMI on 06/08/2015.  She had 4 stents placed at [facility] and 2 more at [facility]. She was previously followed by Dr. Welton Flakes who she has not seen for quite some time. She is not on dual antiplatelets therapy. Initial EKG showed severe ST elevation in lateral leads include 1 and aVL. While in the ED, she complained of severe 10 out of 10 chest pain. After discussing with the patient, she agreed to undergo cardiac catheterization. She stopped briefly in the emergency room, and was taken urgently to [facility]. Cardiac catheterization showed EF 45-50%, DES (2.75 x 20 mm long Promus Premier) to occluded OM1, PTCA to in-stent restenosis of OM2, high grade multifocal instent restenosis of distal RCA and PDA to be treated later in several weeks. Echocardiogram was obtained on 06/09/2015 which showed EF 50-55%, severe hypokinesis of lateral myocardium, grade 1 diastolic dysfunction, severe LVH. Post cath, hemoglobin did go down from 14.1 to 11.6, hemoglobin and hematocrit was rechecked a few days later which continued to be stable. Right radial cath site did not reveal any obvious complication, and has 2+ distal radial pulse without any bleeding or hematoma. She had some hypokalemia which was repleted with potassium chloride.   She was seen in the morning of 06/11/2015, at which time her blood pressure has improved on Imdur and metoprolol. She is also on aspirin, Brilinta and Lipitor. She does have LDL of 160, cholesterol 213, HDL 34, it is encouraged for her to comply with statin medication. I have emphasized on compliance with DAPT. Per patient, she wished to follow-up with Dr. Katrinka Blazing instead, I will arrange one week transition of care follow-up. Prior to discharge, she did complain of some palpitation last night, telemetry was reviewed, no significant arrhythmia was seen other than PVCs. Of note, patient has residual RCA stenosis, will need to arrange 4-6 weeks PCI of RCA via R femoral  approach per Dr. Katrinka Blazing. During this admission, she does have Hgb A1C of 6.2, she will need followup for pre-diabetes. EPIC did note she has allergy with lipitor with itching, however she has been tolerating lipitor here without problem, if she does have itching again, LFT need to be done to make sure her transaminase is not elevated.    Discharge Vitals Blood pressure 118/71, pulse 58, temperature 98.6 F (37 C), temperature source Oral, resp. rate 18, height  (1.575 m), weight 220 lb 14.4 oz (100.2 kg), SpO2 100 %.  Filed Weights   06/08/15 1538 06/11/15 0526  Weight: 220 lb 14.4 oz (100.2 kg) 220 lb 14.4 oz (100.2 kg)    Labs  CBC  Recent Labs  06/08/15 1850 06/09/15 0300 06/11/15 0940  WBC 8.9 7.3  --   NEUTROABS 6.9  --   --   HGB 14.1 11.6* 11.3*  HCT 43.7 36.2 35.3*  MCV 80.2 80.1  --   PLT 276 200  --    Basic Metabolic Panel  Recent Labs  06/08/15 1850  06/10/15 0422 06/11/15 0323  NA 135  < > 133* 135  K 3.5  < > 3.5 3.3*  CL 97*  < > 100* 100*  CO2 26  < > 27 27  GLUCOSE 93  < > 108* 93  BUN 7  < > 9 11  CREATININE 0.77  < > 0.74 0.82  CALCIUM 9.4  < > 8.8* 9.0  MG 2.0  --   --   --   < > = values in this interval not displayed. Liver Function Tests  Recent Labs  06/08/15 1850  AST 190*  ALT 43  ALKPHOS 63  BILITOT 0.6  PROT 8.2*  ALBUMIN 3.5   Cardiac Enzymes  Recent Labs  06/10/15 1526 06/10/15 2210 06/11/15 0323  TROPONINI 34.74* 32.09* 23.84*   Hemoglobin A1C  Recent Labs  06/08/15 1850  HGBA1C 6.2*   Fasting Lipid Panel  Recent Labs  06/09/15 1046  CHOL 213*  HDL 34*  LDLCALC 160*  TRIG 97  CHOLHDL 6.3   Thyroid Function Tests  Recent Labs  06/08/15 1850  TSH 1.351    Disposition  Pt is being discharged home today in good condition.  Follow-up Plans & Appointments      Follow-up Information    Follow up with HAGER, BRYAN, PA-C On 06/19/2015.   Specialties:  Physician Assistant, Radiology,  Interventional Cardiology   Why:  10:00am. Cardiology followup   Contact information:   [address] [place] [address] [phone]       Discharge Medications    Medication List    STOP taking these medications        amLODipine 10 MG tablet  Commonly known as:  NORVASC     hydrochlorothiazide 25 MG tablet  Commonly known as:  HYDRODIURIL     losartan 100 MG tablet  Commonly known as:  COZAAR     metoprolol succinate 25 MG 24 hr tablet  Commonly known as:  TOPROL-XL      TAKE these medications        aspirin 81 MG chewable tablet  Chew 1 tablet (81 mg total) by mouth daily.     atorvastatin 80 MG tablet  Commonly known as:  LIPITOR  Take 1 tablet (80 mg total) by mouth daily at 6 PM.     isosorbide mononitrate 60 MG 24 hr tablet  Commonly known as:  IMDUR  Take 1 tablet (60 mg total) by mouth daily.     metoprolol tartrate 25 MG tablet  Commonly known as:  LOPRESSOR  Take 1 tablet (25 mg total) by mouth 2 (two) times daily.     multivitamin with minerals Tabs tablet  Take 1 tablet by mouth daily.     nitroGLYCERIN 0.4 MG SL tablet  Commonly known as:  NITROSTAT  Place 1 tablet (0.4 mg total) under the tongue every 5 (five) minutes x 3 doses as needed for chest pain.     potassium chloride 10 MEQ tablet  Commonly known as:  K-DUR  Take 10 mEq by mouth daily.     RESVERATROL PO  Take 1 tablet by mouth daily.     ticagrelor 90 MG Tabs tablet  Commonly known as:  BRILINTA  Take 1 tablet (90 mg total) by mouth 2 (two) times daily.         Duration of Discharge Encounter   Greater than 30 minutes including physician time.  Ramond Dial PA-C Pager: [phone] 06/11/2015, 11:25 AM

## 2015-06-11 NOTE — Telephone Encounter (Signed)
TOC per Wynema BirchHao  10/20 @  10am w/ Judie GrieveBryan

## 2015-06-12 NOTE — Telephone Encounter (Signed)
Called the home number--"call cannot be completed as dialed." Called the [phone] number --"person is not taking calls at this time, sorry for the inconvenience".

## 2015-06-13 NOTE — Telephone Encounter (Signed)
Attempted to call again and got same messages as Jennifer Grant on 10/13.  I called emergency contact number and left message requesting patient call us for post-hospital follow-up.

## 2015-06-16 NOTE — Telephone Encounter (Signed)
Attempted to reach patient.  Called patient's number and received message "call cannot be completed as dialed."  Called pt's emergency contact number ([place]) and same message.  Called the other family members listed in patient's demographics and was advised that the people listed were not family members of the patient.  I advised them that I would ask one of our registrars to remove their information from the patient's chart.  I spoke with Glenna Chriscoe who advised she would remove their information from patient's chart.  I am closing encounter as we have not been able to reach patient or family on 3 attempts.

## 2015-06-18 ENCOUNTER — Ambulatory Visit (INDEPENDENT_AMBULATORY_CARE_PROVIDER_SITE_OTHER): Payer: Commercial Managed Care - HMO | Admitting: Family Medicine

## 2015-06-18 ENCOUNTER — Encounter: Payer: Self-pay | Admitting: Family Medicine

## 2015-06-18 VITALS — BP 145/86 | HR 55 | Temp 98.6°F | Ht 62.8 in | Wt 226.0 lb

## 2015-06-18 DIAGNOSIS — I10 Essential (primary) hypertension: Secondary | ICD-10-CM

## 2015-06-18 DIAGNOSIS — Z23 Encounter for immunization: Secondary | ICD-10-CM

## 2015-06-18 DIAGNOSIS — I5033 Acute on chronic diastolic (congestive) heart failure: Secondary | ICD-10-CM

## 2015-06-18 MED ORDER — METOPROLOL TARTRATE 25 MG PO TABS: 25.0000 mg | ORAL_TABLET | Freq: Two times a day (BID) | ORAL | Status: DC

## 2015-06-18 NOTE — Assessment & Plan Note (Signed)
Discussed no vigorous physical activity until after gets that clear from cardiology. Discussed need to take medications faithfully Discuss weight loss Hold off on exercise until clearance given by cardiology

## 2015-06-18 NOTE — Progress Notes (Signed)
BP 145/86 mmHg  Pulse 55  Temp(Src) 98.6 F (37 C)  Ht 5' 2.8" (1.595 m)  Wt 226 lb (102.513 kg)  BMI 40.30 kg/m2  SpO2 99%   Subjective:    Patient ID: Jennifer Grant, female    DOB: 04-24-1951, 64 y.o.   MRN: [id number]  HPI: Jennifer Grant is a 64 y.o. female  Chief Complaint  Patient presents with  . Hospitalization Follow-up    medication recon.   Patient doing much better after hospitalization is planning to have stent repaired in a month or so. Has cardiology appointment tomorrow. Patient's blood pressure slightly elevated but all in all doing well taking medications faithfully No further chest pain chest tightness or angina. Reviewed medications and indication patient indicates she understands Relevant past medical, surgical, family and social history reviewed and updated as indicated. Interim medical history since our last visit reviewed. Allergies and medications reviewed and updated.  Review of Systems  Constitutional: Negative.   Respiratory: Negative.   Cardiovascular: Negative.     Per HPI unless specifically indicated above     Objective:    BP 145/86 mmHg  Pulse 55  Temp(Src) 98.6 F (37 C)  Ht 5' 2.8" (1.595 m)  Wt 226 lb (102.513 kg)  BMI 40.30 kg/m2  SpO2 99%  Wt Readings from Last 3 Encounters:  06/18/15 226 lb (102.513 kg)  06/11/15 220 lb 14.4 oz (100.2 kg)  05/08/15 225 lb 14.4 oz (102.468 kg)    Physical Exam  Constitutional: She is oriented to person, place, and time. She appears well-developed and well-nourished. No distress.  HENT:  Head: Normocephalic and atraumatic.  Right Ear: Hearing normal.  Left Ear: Hearing normal.  Nose: Nose normal.  Eyes: Conjunctivae and lids are normal. Right eye exhibits no discharge. Left eye exhibits no discharge. No scleral icterus.  Cardiovascular: Normal rate and regular rhythm.   Pulmonary/Chest: Effort normal and breath sounds normal. No respiratory distress.  Musculoskeletal: Normal  range of motion.  Neurological: She is alert and oriented to person, place, and time.  Skin: Skin is intact. No rash noted.  Psychiatric: She has a normal mood and affect. Her speech is normal and behavior is normal. Judgment and thought content normal. Cognition and memory are normal.    Results for orders placed or performed during the hospital encounter of 06/08/15  MRSA PCR Screening  Result Value Ref Range   MRSA by PCR NEGATIVE NEGATIVE  Troponin I (serum)  Result Value Ref Range   Troponin I >65.00 (HH) <0.031 ng/mL  Troponin I (serum)  Result Value Ref Range   Troponin I >65.00 (HH) <0.031 ng/mL  Comprehensive metabolic panel  Result Value Ref Range   Sodium 135 135 - 145 mmol/L   Potassium 3.5 3.5 - 5.1 mmol/L   Chloride 97 (L) 101 - 111 mmol/L   CO2 26 22 - 32 mmol/L   Glucose, Bld 93 65 - 99 mg/dL   BUN 7 6 - 20 mg/dL   Creatinine, Ser 4.09 0.44 - 1.00 mg/dL   Calcium 9.4 8.9 - 81.1 mg/dL   Total Protein 8.2 (H) 6.5 - 8.1 g/dL   Albumin 3.5 3.5 - 5.0 g/dL   AST 914 (H) 15 - 41 U/L   ALT 43 14 - 54 U/L   Alkaline Phosphatase 63 38 - 126 U/L   Total Bilirubin 0.6 0.3 - 1.2 mg/dL   GFR calc non Af Amer >60 >60 mL/min   GFR calc Af Amer >  60 >60 mL/min   Anion gap 12 5 - 15  Magnesium  Result Value Ref Range   Magnesium 2.0 1.7 - 2.4 mg/dL  TSH  Result Value Ref Range   TSH 1.351 0.350 - 4.500 uIU/mL  T4, free  Result Value Ref Range   Free T4 0.76 0.61 - 1.12 ng/dL  Hemoglobin Z6X  Result Value Ref Range   Hgb A1c MFr Bld 6.2 (H) 4.8 - 5.6 %   Mean Plasma Glucose 131 mg/dL  Brain natriuretic peptide  Result Value Ref Range   B Natriuretic Peptide 34.1 0.0 - 100.0 pg/mL  CBC WITH DIFFERENTIAL  Result Value Ref Range   WBC 8.9 4.0 - 10.5 K/uL   RBC 5.45 (H) 3.87 - 5.11 MIL/uL   Hemoglobin 14.1 12.0 - 15.0 g/dL   HCT 09.6 04.5 - 40.9 %   MCV 80.2 78.0 - 100.0 fL   MCH 25.9 (L) 26.0 - 34.0 pg   MCHC 32.3 30.0 - 36.0 g/dL   RDW 81.1 91.4 - 78.2 %    Platelets 276 150 - 400 K/uL   Neutrophils Relative % 78 %   Neutro Abs 6.9 1.7 - 7.7 K/uL   Lymphocytes Relative 19 %   Lymphs Abs 1.7 0.7 - 4.0 K/uL   Monocytes Relative 3 %   Monocytes Absolute 0.3 0.1 - 1.0 K/uL   Eosinophils Relative 0 %   Eosinophils Absolute 0.0 0.0 - 0.7 K/uL   Basophils Relative 0 %   Basophils Absolute 0.0 0.0 - 0.1 K/uL  Basic metabolic panel  Result Value Ref Range   Sodium 136 135 - 145 mmol/L   Potassium 3.8 3.5 - 5.1 mmol/L   Chloride 103 101 - 111 mmol/L   CO2 25 22 - 32 mmol/L   Glucose, Bld 127 (H) 65 - 99 mg/dL   BUN 8 6 - 20 mg/dL   Creatinine, Ser 9.56 0.44 - 1.00 mg/dL   Calcium 9.4 8.9 - 21.3 mg/dL   GFR calc non Af Amer >60 >60 mL/min   GFR calc Af Amer >60 >60 mL/min   Anion gap 8 5 - 15  CBC  Result Value Ref Range   WBC 7.3 4.0 - 10.5 K/uL   RBC 4.52 3.87 - 5.11 MIL/uL   Hemoglobin 11.6 (L) 12.0 - 15.0 g/dL   HCT 08.6 57.8 - 46.9 %   MCV 80.1 78.0 - 100.0 fL   MCH 25.7 (L) 26.0 - 34.0 pg   MCHC 32.0 30.0 - 36.0 g/dL   RDW 62.9 (H) 52.8 - 41.3 %   Platelets 200 150 - 400 K/uL  Troponin I  Result Value Ref Range   Troponin I >65.00 (HH) <0.031 ng/mL  Troponin I (serum)  Result Value Ref Range   Troponin I >65.00 (HH) <0.031 ng/mL  Troponin I (serum)  Result Value Ref Range   Troponin I >65.00 (HH) <0.031 ng/mL  Troponin I (serum)  Result Value Ref Range   Troponin I >65.00 (HH) <0.031 ng/mL  Lipid panel  Result Value Ref Range   Cholesterol 213 (H) 0 - 200 mg/dL   Triglycerides 97 <244 mg/dL   HDL 34 (L) >01 mg/dL   Total CHOL/HDL Ratio 6.3 RATIO   VLDL 19 0 - 40 mg/dL   LDL Cholesterol 027 (H) 0 - 99 mg/dL  Troponin I (serum)  Result Value Ref Range   Troponin I 57.11 (HH) <0.031 ng/mL  Basic metabolic panel  Result Value Ref Range   Sodium  133 (L) 135 - 145 mmol/L   Potassium 3.5 3.5 - 5.1 mmol/L   Chloride 100 (L) 101 - 111 mmol/L   CO2 27 22 - 32 mmol/L   Glucose, Bld 108 (H) 65 - 99 mg/dL   BUN 9 6 - 20  mg/dL   Creatinine, Ser 4.09 0.44 - 1.00 mg/dL   Calcium 8.8 (L) 8.9 - 10.3 mg/dL   GFR calc non Af Amer >60 >60 mL/min   GFR calc Af Amer >60 >60 mL/min   Anion gap 6 5 - 15  Troponin I (serum)  Result Value Ref Range   Troponin I 34.74 (HH) <0.031 ng/mL  Troponin I (serum)  Result Value Ref Range   Troponin I 32.09 (HH) <0.031 ng/mL  Troponin I (serum)  Result Value Ref Range   Troponin I 23.84 (HH) <0.031 ng/mL  Basic metabolic panel  Result Value Ref Range   Sodium 135 135 - 145 mmol/L   Potassium 3.3 (L) 3.5 - 5.1 mmol/L   Chloride 100 (L) 101 - 111 mmol/L   CO2 27 22 - 32 mmol/L   Glucose, Bld 93 65 - 99 mg/dL   BUN 11 6 - 20 mg/dL   Creatinine, Ser 8.11 0.44 - 1.00 mg/dL   Calcium 9.0 8.9 - 91.4 mg/dL   GFR calc non Af Amer >60 >60 mL/min   GFR calc Af Amer >60 >60 mL/min   Anion gap 8 5 - 15  Hemoglobin and hematocrit, blood  Result Value Ref Range   Hemoglobin 11.3 (L) 12.0 - 15.0 g/dL   HCT 78.2 (L) 95.6 - 21.3 %  I-STAT, chem 8  Result Value Ref Range   Sodium 139 135 - 145 mmol/L   Potassium 2.9 (L) 3.5 - 5.1 mmol/L   Chloride 99 (L) 101 - 111 mmol/L   BUN 8 6 - 20 mg/dL   Creatinine, Ser 0.86 0.44 - 1.00 mg/dL   Glucose, Bld 578 (H) 65 - 99 mg/dL   Calcium, Ion 4.69 (H) 1.13 - 1.30 mmol/L   TCO2 29 0 - 100 mmol/L   Hemoglobin 13.9 12.0 - 15.0 g/dL   HCT 62.9 52.8 - 41.3 %  POCT Activated clotting time  Result Value Ref Range   Activated Clotting Time 411 seconds  POCT Activated clotting time  Result Value Ref Range   Activated Clotting Time 202 seconds  I-STAT, chem 8  Result Value Ref Range   Sodium 138 135 - 145 mmol/L   Potassium 3.3 (L) 3.5 - 5.1 mmol/L   Chloride 99 (L) 101 - 111 mmol/L   BUN 7 6 - 20 mg/dL   Creatinine, Ser 2.44 0.44 - 1.00 mg/dL   Glucose, Bld 010 (H) 65 - 99 mg/dL   Calcium, Ion 2.72 5.36 - 1.30 mmol/L   TCO2 26 0 - 100 mmol/L   Hemoglobin 13.3 12.0 - 15.0 g/dL   HCT 64.4 03.4 - 74.2 %      Assessment & Plan:    Problem List Items Addressed This Visit      Cardiovascular and Mediastinum   Essential hypertension - Primary    The current medical regimen is effective;  continue present plan and medications.       Relevant Medications   amLODipine (NORVASC) 10 MG tablet   losartan (COZAAR) 100 MG tablet   hydrochlorothiazide (HYDRODIURIL) 25 MG tablet   metoprolol tartrate (LOPRESSOR) 25 MG tablet   Acute on chronic diastolic heart failure (HCC)    Discussed no  vigorous physical activity until after gets that clear from cardiology. Discussed need to take medications faithfully Discuss weight loss Hold off on exercise until clearance given by cardiology      Relevant Medications   amLODipine (NORVASC) 10 MG tablet   losartan (COZAAR) 100 MG tablet   hydrochlorothiazide (HYDRODIURIL) 25 MG tablet   metoprolol tartrate (LOPRESSOR) 25 MG tablet       Follow up plan: Return in about 2 months (around 08/18/2015), or if symptoms worsen or fail to improve, for Physical Exam.

## 2015-06-18 NOTE — Assessment & Plan Note (Signed)
The current medical regimen is effective;  continue present plan and medications.  

## 2015-06-18 NOTE — Addendum Note (Signed)
Addended by: Bennetta LaosWILSON, Helio Lack H on: 06/18/2015 11:46 AM   Modules accepted: Orders

## 2015-06-19 ENCOUNTER — Encounter: Payer: Self-pay | Admitting: Physician Assistant

## 2015-06-19 ENCOUNTER — Ambulatory Visit (INDEPENDENT_AMBULATORY_CARE_PROVIDER_SITE_OTHER): Payer: Commercial Managed Care - HMO | Admitting: Physician Assistant

## 2015-06-19 VITALS — BP 162/120 | HR 88 | Ht 63.0 in | Wt 224.4 lb

## 2015-06-19 DIAGNOSIS — I5032 Chronic diastolic (congestive) heart failure: Secondary | ICD-10-CM | POA: Insufficient documentation

## 2015-06-19 DIAGNOSIS — I5030 Unspecified diastolic (congestive) heart failure: Secondary | ICD-10-CM | POA: Insufficient documentation

## 2015-06-19 DIAGNOSIS — I2510 Atherosclerotic heart disease of native coronary artery without angina pectoris: Secondary | ICD-10-CM | POA: Diagnosis not present

## 2015-06-19 DIAGNOSIS — I10 Essential (primary) hypertension: Secondary | ICD-10-CM | POA: Diagnosis not present

## 2015-06-19 DIAGNOSIS — E785 Hyperlipidemia, unspecified: Secondary | ICD-10-CM | POA: Diagnosis not present

## 2015-06-19 DIAGNOSIS — Z6841 Body Mass Index (BMI) 40.0 and over, adult: Secondary | ICD-10-CM | POA: Insufficient documentation

## 2015-06-19 DIAGNOSIS — E669 Obesity, unspecified: Secondary | ICD-10-CM

## 2015-06-19 DIAGNOSIS — E66813 Obesity, class 3: Secondary | ICD-10-CM | POA: Insufficient documentation

## 2015-06-19 MED ORDER — METOPROLOL TARTRATE 50 MG PO TABS: 50.0000 mg | ORAL_TABLET | Freq: Two times a day (BID) | ORAL | Status: DC

## 2015-06-19 NOTE — Patient Instructions (Signed)
Medication Instructions:  Your physician has recommended you make the following change in your medication:   1- Increase Metoprolol Tartrate 50 mg by mouth twice daily.  Labwork: Your physician recommends that you return for lab work on 07/15/2015 for lipid panel, BMET, CBC, INR/PT and hepatic panel.  Testing/Procedures: Your physician has requested that you have a cardiac catheterization. Cardiac catheterization is used to diagnose and/or treat various heart conditions. Doctors may recommend this procedure for a number of different reasons. The most common reason is to evaluate chest pain. Chest pain can be a symptom of coronary artery disease (CAD), and cardiac catheterization can show whether plaque is narrowing or blocking your heart's arteries. This procedure is also used to evaluate the valves, as well as measure the blood flow and oxygen levels in different parts of your heart. For further information please visit [email or web address] Please follow instruction sheet, as given.  Follow-Up: Your physician recommends that you schedule a follow-up appointment 2 weeks after heart catheterization with Dr. Katrinka BlazingSmith.      Coronary Angiogram A coronary angiogram, also called coronary angiography, is an X-ray procedure used to look at the arteries in the heart. In this procedure, a dye (contrast dye) is injected through a long, hollow tube (catheter). The catheter is about the size of a piece of cooked spaghetti and is inserted through your groin, wrist, or arm. The dye is injected into each artery, and X-rays are then taken to show if there is a blockage in the arteries of your heart. LET [facility] PROVIDER KNOW ABOUT:  Any allergies you have, including allergies to shellfish or contrast dye.   All medicines you are taking, including vitamins, herbs, eye drops, creams, and over-the-counter medicines.   Previous problems you or members of your family have had with the use of anesthetics.    Any blood disorders you have.   Previous surgeries you have had.  History of kidney problems or failure.   Other medical conditions you have. RISKS AND COMPLICATIONS  Generally, a coronary angiogram is a safe procedure. However, problems can occur and include:  Allergic reaction to the dye.  Bleeding from the access site or other locations.  Kidney injury, especially in people with impaired kidney function.  Stroke (rare).  Heart attack (rare). BEFORE THE PROCEDURE   Do not eat or drink anything after midnight the night before the procedure or as directed by your health care provider.   Ask your health care provider about changing or stopping your regular medicines. This is especially important if you are taking diabetes medicines or blood thinners. PROCEDURE  You may be given a medicine to help you relax (sedative) before the procedure. This medicine is given through an intravenous (IV) access tube that is inserted into one of your veins.   The area where the catheter will be inserted will be washed and shaved. This is usually done in the groin but may be done in the fold of your arm (near your elbow) or in the wrist.   A medicine will be given to numb the area where the catheter will be inserted (local anesthetic).   The health care provider will insert the catheter into an artery. The catheter will be guided by using a special type of X-ray (fluoroscopy) of the blood vessel being examined.   A special dye will then be injected into the catheter, and X-rays will be taken. The dye will help to show where any narrowing or blockages are located in  the heart arteries.  AFTER THE PROCEDURE   If the procedure is done through the leg, you will be kept in bed lying flat for several hours. You will be instructed to not bend or cross your legs.  The insertion site will be checked frequently.   The pulse in your feet or wrist will be checked frequently.   Additional  blood tests, X-rays, and an electrocardiogram may be done.    This information is not intended to replace advice given to you by your health care provider. Make sure you discuss any questions you have with your health care provider.   Document Released: 02/20/2003 Document Revised: 09/06/2014 Document Reviewed: 01/08/2013 Elsevier Interactive Patient Education [employer]

## 2015-06-19 NOTE — Progress Notes (Signed)
Patient ID: Jennifer LimeValerie E Bowen, female   DOB: 04/02/1951, 64 y.o.   MRN: [id number]    Date:  06/19/2015   ID:  Jennifer LimeValerie E Axtell, DOB 10/15/1950, MRN [id number]  PCP:  Vonita MossMark Crissman, MD  Primary Cardiologist:  Princella IonSmith   No chief complaint on file.    History of Present Illness: Jennifer Grant is a 64 y.o. female [place] female with PMH of HTN, HLD and CAD s/p multiple PCI presented as lateral STEMI on 06/08/2015. [facility] and [facility]. She was previously followed by Dr. Welton FlakesKhan who she has not seen for quite some time. She was not on dual antiplatelets therapy. She underwent cardiac catheterization revealed an EF 45-50%, DES (2.75 x 20 mm long Promus Premier) to occluded OM1, PTCA to in-stent restenosis of OM2, high grade multifocal instent restenosis of distal RCA and PDA to be treated later in several weeks. Echocardiogram was obtained on 06/09/2015 which showed EF 50-55%, severe hypokinesis of lateral myocardium, grade 1 diastolic dysfunction, severe LVH. Post cath, hemoglobin did go down from 14.1 to 11.6, hemoglobin and hematocrit was rechecked a few days later which continued to be stable. Right radial cath site did not reveal any obvious complication, and has 2+ distal radial pulse without any bleeding or hematoma. She had some hypokalemia which was repleted with potassium chloride.   She was seen in the morning of 06/11/2015, at which time her blood pressure has improved on Imdur and metoprolol. She is also on aspirin, Brilinta and Lipitor. She does have LDL of 160, cholesterol 213, HDL 34, it is encouraged for her to comply with statin medication.  The patient has residual RCA stenosis, will need to arrange 4-6 weeks PCI of RCA via R femoral approach per Dr. Katrinka BlazingSmith. During this admission, she does have Hgb A1C of 6.2,   Shannon presented for posthospital follow-up. She reports feeling fine does get some exertional dyspnea. Yesterday she said she got great and she had a  walk with her daughter. She sometimes feels a regular heartbeat.Marland Kitchen.  He denies any chest pain. She's been taken aspirin and Brilinta as directed.  Also reports having a headache. This may be related to the Imdur.  The patient currently denies nausea, vomiting, fever, orthopnea, dizziness, PND, cough, congestion, abdominal pain, hematochezia, melena, lower extremity edema, claudication.  Wt Readings from Last 3 Encounters:  06/19/15 224 lb 6.4 oz (101.787 kg)  06/18/15 226 lb (102.513 kg)  06/11/15 220 lb 14.4 oz (100.2 kg)     Past Medical History  Diagnosis Date  . Hyperlipidemia   . Mitral valve disorder   . Menopausal state     Current Outpatient Prescriptions  Medication Sig Dispense Refill  . aspirin 81 MG chewable tablet Chew 1 tablet (81 mg total) by mouth daily.    Marland Kitchen. atorvastatin (LIPITOR) 80 MG tablet Take 1 tablet (80 mg total) by mouth daily at 6 PM. 30 tablet 11  . isosorbide mononitrate (IMDUR) 60 MG 24 hr tablet Take 1 tablet (60 mg total) by mouth daily. 30 tablet 11  . metoprolol tartrate (LOPRESSOR) 50 MG tablet Take 1 tablet (50 mg total) by mouth 2 (two) times daily. 60 tablet 11  . Multiple Vitamin (MULTIVITAMIN WITH MINERALS) TABS tablet Take 1 tablet by mouth daily.    Marland Kitchen. RESVERATROL PO Take 1 tablet by mouth daily.    . ticagrelor (BRILINTA) 90 MG TABS tablet Take 1 tablet (90 mg total) by mouth 2 (  two) times daily. 60 tablet 11  . nitroGLYCERIN (NITROSTAT) 0.4 MG SL tablet Place 1 tablet (0.4 mg total) under the tongue every 5 (five) minutes x 3 doses as needed for chest pain. 25 tablet 3   No current facility-administered medications for this visit.    Allergies:    Allergies  Allergen Reactions  . Latex Other (See Comments)    Gloves make hands look black after prolonged use  . Lipitor [Atorvastatin] Itching    Mouth itching  . Penicillins Itching and Swelling    Tongue itching and lip swelling Has patient had a PCN reaction causing immediate rash,  facial/tongue/throat swelling, SOB or lightheadedness with hypotension: yes Has patient had a PCN reaction causing severe rash involving mucus membranes or skin necrosis: No Has patient had a PCN reaction that required hospitalization No Has patient had a PCN reaction occurring within the last 10 years: No If all of the above answers are "NO", then may proceed with Cephalosporin use.  Danie Chandler [Aliskiren] Rash    Social History:  The patient  reports that she has never smoked. She has never used smokeless tobacco. She reports that she does not drink alcohol or use illicit drugs.   Family history:   Family History  Problem Relation Age of Onset  . Cancer Neg Hx   . Diabetes Neg Hx   . Heart disease Neg Hx     ROS:  Please see the history of present illness.  All other systems reviewed and negative.   PHYSICAL EXAM: VS:  BP 162/120 mmHg  Pulse 88  Ht  (1.6 m)  Wt 224 lb 6.4 oz (101.787 kg)  BMI 39.76 kg/m2  SpO2 97% Obese, well developed, in no acute distress HEENT: Pupils are equal round react to light accommodation extraocular movements are intact.  Neck: no JVDNo cervical lymphadenopathy. Cardiac: Regular rate and rhythm without murmurs rubs or gallops. Lungs:  clear to auscultation bilaterally, no wheezing, rhonchi or rales Abd: soft, nontender, positive bowel sounds all quadrants, no hepatosplenomegaly Ext: no lower extremity edema.  2+ radial and dorsalis pedis pulses. Skin: warm and dry Neuro:  Grossly normal   ASSESSMENT AND PLAN:  Problem List Items Addressed This Visit    Hyperlipidemia   Relevant Medications   metoprolol tartrate (LOPRESSOR) 50 MG tablet   Essential hypertension   Relevant Medications   metoprolol tartrate (LOPRESSOR) 50 MG tablet   Coronary artery disease involving native heart - Primary (Chronic)   Relevant Medications   metoprolol tartrate (LOPRESSOR) 50 MG tablet   Other Relevant Orders   Lipid Profile   Hepatic function panel     Basic Metabolic Panel (BMET)   CBC with Differential   INR/PT   Chronic diastolic heart failure (HCC)   Relevant Medications   metoprolol tartrate (LOPRESSOR) 50 MG tablet      Coronary artery disease: Complaints of angina. She'll be scheduled for staged percutaneous coronary intervention of the distal RCA in approximate 4 weeks.  She is currently on aspirin, Brilinta, Lopressor 25 mg twice daily, Imdur 60 mg and statin.  Will titrate her Lopressor to 50 mg twice daily for better blood pressure control.  Essential hypertension: I rechecked her blood pressure was 152/110. Yesterday she said it was partially 146/89. Will increase her Lopressor to 50 mg twice daily.  Hyperlipidemia: Continue statin which she was not on before. Check LFTs and lipid panel with precath labs.   Chronic diastolic heart failure:  Patient appears euvolemic  obesity, BMI 39: Patient be referred to a registered dietitian to help facilitate weight loss.

## 2015-07-03 ENCOUNTER — Telehealth: Payer: Self-pay | Admitting: Family Medicine

## 2015-07-03 NOTE — Telephone Encounter (Signed)
[facility] called to inform us of the reaction the patient is having to the medication: Brilinta, pt stated she becomes extremely short of breath within 15 minutes of taking 90mg  of this medication. Please contact pt @ [phone]. Thanks.

## 2015-07-03 NOTE — Telephone Encounter (Signed)
Patient is having a reaction on medication Brilinta (tricagrelor). I called patient to see what was going on and she said that her chest felt heavy and she felt like her heart was skipping a beat. Looking into the Rx I see where Dr. Dossie Arbourrissman did not write this precription. I asked her who wrote the Rx and she said [facility]. I asked if she had his number or did I need to look it up and she said she had it. I told her to call him and ask him what to do about the medication. I told her if the medication made her have breathing trouble or the reaction became severe to stop taking the medication till further notice or go to the ER. She said that Dr. Katrinka BlazingSmith told her to not to stop taking it no matter what. She said "they told me I would have some reactions to the medication". I then explained that she needed to call Dr. Katrinka BlazingSmith and see what to do and to let us know what the result is.  Routing this telephone encounter to Dr. Laural BenesJohnson since Dr. Dossie Arbourrissman is not here.

## 2015-07-03 NOTE — Telephone Encounter (Signed)
Notified patient, she will go to [facility].

## 2015-07-03 NOTE — Telephone Encounter (Signed)
Needs to go to the ER. Now.

## 2015-07-04 ENCOUNTER — Telehealth: Payer: Self-pay | Admitting: Interventional Cardiology

## 2015-07-04 NOTE — Telephone Encounter (Signed)
Pt called and stated that she didn't go to the emergency room and she was not going

## 2015-07-04 NOTE — Telephone Encounter (Signed)
Pt c/o Shortness Of Breath: STAT if SOB developed within the last 24 hours or pt is noticeably SOB on the phone  1. Are you currently SOB (can you hear that pt is SOB on the phone)?NO  2. How long have you been experiencing SOB? 3 DAYS  3. Are you SOB when sitting or when up moving around? up moving around  4. Are you currently experiencing any other symptoms? NO

## 2015-07-04 NOTE — Telephone Encounter (Signed)
Called patient about her SOB. Patient stated she is no longer SOB and she if fine. Patient stated she was bending over to much and this caused her SOB. Patient denies having SOB due to Brilinta. Informed patient that if she gets SOB again and not feeling well to give our office a call and if it gets worse to go to ED. Patient verbalized understanding.

## 2015-07-15 ENCOUNTER — Other Ambulatory Visit: Payer: Commercial Managed Care - HMO

## 2015-07-15 ENCOUNTER — Other Ambulatory Visit
Admission: RE | Admit: 2015-07-15 | Discharge: 2015-07-15 | Disposition: A | Discharge status: Home / Self Care | Payer: Commercial Managed Care - HMO | Source: Ambulatory Visit | Attending: Physician Assistant | Admitting: Physician Assistant

## 2015-07-15 ENCOUNTER — Telehealth: Payer: Self-pay | Admitting: Physician Assistant

## 2015-07-15 DIAGNOSIS — Z029 Encounter for administrative examinations, unspecified: Secondary | ICD-10-CM | POA: Insufficient documentation

## 2015-07-15 LAB — BASIC METABOLIC PANEL
Anion gap: 7 (ref 5–15)
BUN: 11 mg/dL (ref 6–20)
CO2: 29 mmol/L (ref 22–32)
Calcium: 10 mg/dL (ref 8.9–10.3)
Chloride: 102 mmol/L (ref 101–111)
Creatinine, Ser: 0.72 mg/dL (ref 0.44–1.00)
GFR calc Af Amer: >60 mL/min (ref >60)
GFR calc non Af Amer: >60 mL/min (ref >60)
Glucose, Bld: 103 mg/dL — ABNORMAL HIGH (ref 65–99)
Potassium: 3.5 mmol/L (ref 3.5–5.1)
Sodium: 138 mmol/L (ref 135–145)

## 2015-07-15 LAB — HEPATIC FUNCTION PANEL
ALT: 16 U/L (ref 14–54)
AST: 20 U/L (ref 15–41)
Albumin: 3.5 g/dL (ref 3.5–5.0)
Alkaline Phosphatase: 71 U/L (ref 38–126)
Total Bilirubin: 0.9 mg/dL (ref 0.3–1.2)
Total Protein: 7.9 g/dL (ref 6.5–8.1)

## 2015-07-15 LAB — CBC WITH DIFFERENTIAL/PLATELET
Basophils Absolute: 0 10*3/uL (ref 0–0.1)
Basophils Relative: 1 %
Eosinophils Absolute: 0.3 10*3/uL (ref 0–0.7)
Eosinophils Relative: 5 %
HCT: 40.9 % (ref 35.0–47.0)
Hemoglobin: 12.9 g/dL (ref 12.0–16.0)
Lymphocytes Relative: 37 %
Lymphs Abs: 2 10*3/uL (ref 1.0–3.6)
MCH: 24.8 pg — ABNORMAL LOW (ref 26.0–34.0)
MCHC: 31.6 g/dL — ABNORMAL LOW (ref 32.0–36.0)
MCV: 78.6 fL — ABNORMAL LOW (ref 80.0–100.0)
Monocytes Absolute: 0.4 10*3/uL (ref 0.2–0.9)
Monocytes Relative: 7 %
Neutro Abs: 2.7 10*3/uL (ref 1.4–6.5)
Neutrophils Relative %: 50 %
Platelets: 213 10*3/uL (ref 150–440)
RBC: 5.21 MIL/uL — ABNORMAL HIGH (ref 3.80–5.20)
RDW: 16.3 % — ABNORMAL HIGH (ref 11.5–14.5)
WBC: 5.5 10*3/uL (ref 3.6–11.0)

## 2015-07-15 LAB — PROTIME-INR
INR: 1.05
Prothrombin Time: 13.9 seconds (ref 11.4–15.0)

## 2015-07-15 NOTE — Telephone Encounter (Signed)
Spoke with mandy at the [place] office, pt has eaten this am. They are going to do all the blood work except the lipid panel.  They will reschedule for her to have that drawn.

## 2015-07-16 ENCOUNTER — Ambulatory Visit (HOSPITAL_COMMUNITY)
Admission: RE | Admit: 2015-07-16 | Discharge: 2015-07-17 | Disposition: A | Discharge status: Home / Self Care | Payer: Commercial Managed Care - HMO | Source: Ambulatory Visit | Attending: Interventional Cardiology | Admitting: Interventional Cardiology

## 2015-07-16 ENCOUNTER — Encounter (HOSPITAL_COMMUNITY)
Admission: RE | Disposition: A | Discharge status: Home / Self Care | Payer: Self-pay | Source: Ambulatory Visit | Attending: Interventional Cardiology

## 2015-07-16 ENCOUNTER — Encounter (HOSPITAL_COMMUNITY): Payer: Self-pay | Admitting: Interventional Cardiology

## 2015-07-16 DIAGNOSIS — Z7902 Long term (current) use of antithrombotics/antiplatelets: Secondary | ICD-10-CM | POA: Diagnosis not present

## 2015-07-16 DIAGNOSIS — E785 Hyperlipidemia, unspecified: Secondary | ICD-10-CM | POA: Diagnosis not present

## 2015-07-16 DIAGNOSIS — Z7982 Long term (current) use of aspirin: Secondary | ICD-10-CM | POA: Insufficient documentation

## 2015-07-16 DIAGNOSIS — E876 Hypokalemia: Secondary | ICD-10-CM | POA: Insufficient documentation

## 2015-07-16 DIAGNOSIS — I2129 ST elevation (STEMI) myocardial infarction involving other sites: Secondary | ICD-10-CM

## 2015-07-16 DIAGNOSIS — I97410 Intraoperative hemorrhage and hematoma of a circulatory system organ or structure complicating a cardiac catheterization: Secondary | ICD-10-CM | POA: Insufficient documentation

## 2015-07-16 DIAGNOSIS — I9581 Postprocedural hypotension: Secondary | ICD-10-CM | POA: Diagnosis not present

## 2015-07-16 DIAGNOSIS — I5032 Chronic diastolic (congestive) heart failure: Secondary | ICD-10-CM | POA: Insufficient documentation

## 2015-07-16 DIAGNOSIS — Y840 Cardiac catheterization as the cause of abnormal reaction of the patient, or of later complication, without mention of misadventure at the time of the procedure: Secondary | ICD-10-CM | POA: Diagnosis not present

## 2015-07-16 DIAGNOSIS — I25118 Atherosclerotic heart disease of native coronary artery with other forms of angina pectoris: Secondary | ICD-10-CM | POA: Diagnosis not present

## 2015-07-16 DIAGNOSIS — T82855A Stenosis of coronary artery stent, initial encounter: Secondary | ICD-10-CM | POA: Insufficient documentation

## 2015-07-16 DIAGNOSIS — Y832 Surgical operation with anastomosis, bypass or graft as the cause of abnormal reaction of the patient, or of later complication, without mention of misadventure at the time of the procedure: Secondary | ICD-10-CM | POA: Insufficient documentation

## 2015-07-16 DIAGNOSIS — Z79899 Other long term (current) drug therapy: Secondary | ICD-10-CM | POA: Diagnosis not present

## 2015-07-16 DIAGNOSIS — I10 Essential (primary) hypertension: Secondary | ICD-10-CM | POA: Diagnosis present

## 2015-07-16 DIAGNOSIS — I2510 Atherosclerotic heart disease of native coronary artery without angina pectoris: Secondary | ICD-10-CM

## 2015-07-16 DIAGNOSIS — I110 Hypertensive heart disease with heart failure: Secondary | ICD-10-CM | POA: Insufficient documentation

## 2015-07-16 DIAGNOSIS — R0600 Dyspnea, unspecified: Secondary | ICD-10-CM

## 2015-07-16 DIAGNOSIS — Z955 Presence of coronary angioplasty implant and graft: Secondary | ICD-10-CM

## 2015-07-16 DIAGNOSIS — I252 Old myocardial infarction: Secondary | ICD-10-CM | POA: Diagnosis not present

## 2015-07-16 HISTORY — DX: Gastro-esophageal reflux disease without esophagitis: K21.9

## 2015-07-16 HISTORY — PX: CARDIAC CATHETERIZATION: SHX172

## 2015-07-16 HISTORY — DX: Contact with and (suspected) exposure to tuberculosis: Z20.1

## 2015-07-16 HISTORY — DX: Angina pectoris, unspecified: I20.9

## 2015-07-16 LAB — POCT ACTIVATED CLOTTING TIME
ACTIVATED CLOTTING TIME: 393 s
ACTIVATED CLOTTING TIME: 177 s
Activated Clotting Time: 436 seconds
Activated Clotting Time: 257 seconds
Activated Clotting Time: 208 seconds

## 2015-07-16 LAB — LIPID PANEL
Cholesterol: 120 mg/dL (ref 0–200)
HDL: 32 mg/dL — ABNORMAL LOW (ref >40)
LDL CALC: 78 mg/dL (ref 0–99)
Total CHOL/HDL Ratio: 3.8 RATIO
Triglycerides: 51 mg/dL (ref <150)
VLDL: 10 mg/dL (ref 0–40)

## 2015-07-16 SURGERY — CORONARY STENT INTERVENTION
Anesthesia: LOCAL

## 2015-07-16 MED ORDER — RESVERATROL 100 MG PO CAPS: ORAL_CAPSULE | Freq: Every day | ORAL | Status: DC

## 2015-07-16 MED ORDER — IOHEXOL 350 MG/ML SOLN
INTRAVENOUS | Status: DC | PRN
  Administered 2015-07-16 (×2): 100 mL via INTRAVENOUS

## 2015-07-16 MED ORDER — NITROGLYCERIN IN D5W 200-5 MCG/ML-% IV SOLN: 0.0000 ug/min | INTRAVENOUS | Status: DC

## 2015-07-16 MED ORDER — ATORVASTATIN CALCIUM 80 MG PO TABS
80.0000 mg | ORAL_TABLET | Freq: Every day | ORAL | Status: DC
  Administered 2015-07-16: 80 mg via ORAL
  Filled 2015-07-16: qty 1

## 2015-07-16 MED ORDER — SODIUM CHLORIDE 0.9 % WEIGHT BASED INFUSION: 1.0000 mL/kg/h | INTRAVENOUS | Status: DC

## 2015-07-16 MED ORDER — MIDAZOLAM HCL 2 MG/2ML IJ SOLN
INTRAMUSCULAR | Status: DC | PRN
  Administered 2015-07-16 (×2): 1 mg via INTRAVENOUS

## 2015-07-16 MED ORDER — ISOSORBIDE MONONITRATE ER 60 MG PO TB24: 60.0000 mg | ORAL_TABLET | Freq: Every day | ORAL | Status: DC

## 2015-07-16 MED ORDER — ATORVASTATIN CALCIUM 80 MG PO TABS: 80.0000 mg | ORAL_TABLET | Freq: Every day | ORAL | Status: DC

## 2015-07-16 MED ORDER — ANGIOPLASTY BOOK
Freq: Once | Status: DC
  Filled 2015-07-16: qty 1

## 2015-07-16 MED ORDER — NITROGLYCERIN 0.4 MG SL SUBL: 0.4000 mg | SUBLINGUAL_TABLET | SUBLINGUAL | Status: DC | PRN

## 2015-07-16 MED ORDER — TICAGRELOR 90 MG PO TABS
90.0000 mg | ORAL_TABLET | ORAL | Status: DC
  Filled 2015-07-16: qty 1

## 2015-07-16 MED ORDER — MIDAZOLAM HCL 2 MG/2ML IJ SOLN
INTRAMUSCULAR | Status: AC
  Filled 2015-07-16: qty 2

## 2015-07-16 MED ORDER — FENTANYL CITRATE (PF) 100 MCG/2ML IJ SOLN
INTRAMUSCULAR | Status: DC | PRN
  Administered 2015-07-16: 50 ug via INTRAVENOUS

## 2015-07-16 MED ORDER — SODIUM CHLORIDE 0.9 % IV SOLN: 250.0000 mL | INTRAVENOUS | Status: DC | PRN

## 2015-07-16 MED ORDER — OXYCODONE-ACETAMINOPHEN 5-325 MG PO TABS: 1.0000 | ORAL_TABLET | ORAL | Status: DC | PRN

## 2015-07-16 MED ORDER — NITROGLYCERIN IN D5W 200-5 MCG/ML-% IV SOLN
INTRAVENOUS | Status: DC | PRN
  Administered 2015-07-16: 10 ug/min via INTRAVENOUS

## 2015-07-16 MED ORDER — ADULT MULTIVITAMIN W/MINERALS CH
1.0000 | ORAL_TABLET | Freq: Every day | ORAL | Status: DC
Start: 2015-07-16 — End: 2015-07-17
  Administered 2015-07-17: 1 via ORAL
  Filled 2015-07-16: qty 1

## 2015-07-16 MED ORDER — ASPIRIN 81 MG PO CHEW
81.0000 mg | CHEWABLE_TABLET | Freq: Every day | ORAL | Status: DC
  Administered 2015-07-17: 81 mg via ORAL
  Filled 2015-07-16: qty 1

## 2015-07-16 MED ORDER — TICAGRELOR 90 MG PO TABS: 90.0000 mg | ORAL_TABLET | Freq: Two times a day (BID) | ORAL | Status: DC

## 2015-07-16 MED ORDER — NITROGLYCERIN 1 MG/10 ML FOR IR/CATH LAB
INTRA_ARTERIAL | Status: AC
  Filled 2015-07-16: qty 10

## 2015-07-16 MED ORDER — TICAGRELOR 90 MG PO TABS
90.0000 mg | ORAL_TABLET | Freq: Two times a day (BID) | ORAL | Status: DC
  Administered 2015-07-16 – 2015-07-17 (×2): 90 mg via ORAL
  Filled 2015-07-16 (×2): qty 1

## 2015-07-16 MED ORDER — HEPARIN (PORCINE) IN NACL 2-0.9 UNIT/ML-% IJ SOLN
INTRAMUSCULAR | Status: AC
  Filled 2015-07-16: qty 1000

## 2015-07-16 MED ORDER — HEPARIN SODIUM (PORCINE) 1000 UNIT/ML IJ SOLN
INTRAMUSCULAR | Status: DC | PRN
  Administered 2015-07-16: 8000 [IU] via INTRAVENOUS
  Administered 2015-07-16: 2500 [IU] via INTRAVENOUS

## 2015-07-16 MED ORDER — NITROGLYCERIN IN D5W 200-5 MCG/ML-% IV SOLN: 15.0000 ug/min | INTRAVENOUS | Status: AC

## 2015-07-16 MED ORDER — SODIUM CHLORIDE 0.9 % WEIGHT BASED INFUSION
3.0000 mL/kg/h | INTRAVENOUS | Status: DC
  Administered 2015-07-16: 3 mL/kg/h via INTRAVENOUS

## 2015-07-16 MED ORDER — ONDANSETRON HCL 4 MG/2ML IJ SOLN: 4.0000 mg | Freq: Four times a day (QID) | INTRAMUSCULAR | Status: DC | PRN

## 2015-07-16 MED ORDER — ATROPINE SULFATE 0.1 MG/ML IJ SOLN
INTRAMUSCULAR | Status: AC
  Filled 2015-07-16: qty 10

## 2015-07-16 MED ORDER — VERAPAMIL HCL 2.5 MG/ML IV SOLN
INTRAVENOUS | Status: AC
  Filled 2015-07-16: qty 2

## 2015-07-16 MED ORDER — FENTANYL CITRATE (PF) 100 MCG/2ML IJ SOLN
INTRAMUSCULAR | Status: AC
  Filled 2015-07-16: qty 2

## 2015-07-16 MED ORDER — NITROGLYCERIN 1 MG/10 ML FOR IR/CATH LAB
INTRA_ARTERIAL | Status: DC | PRN
  Administered 2015-07-16: 10:00:00

## 2015-07-16 MED ORDER — SODIUM CHLORIDE 0.9 % IJ SOLN: 3.0000 mL | INTRAMUSCULAR | Status: DC | PRN

## 2015-07-16 MED ORDER — SODIUM CHLORIDE 0.9 % IV SOLN: INTRAVENOUS | Status: DC

## 2015-07-16 MED ORDER — HEPARIN SODIUM (PORCINE) 1000 UNIT/ML IJ SOLN
INTRAMUSCULAR | Status: AC
  Filled 2015-07-16: qty 1

## 2015-07-16 MED ORDER — LIDOCAINE HCL (PF) 1 % IJ SOLN
INTRAMUSCULAR | Status: AC
  Filled 2015-07-16: qty 30

## 2015-07-16 MED ORDER — METOPROLOL TARTRATE 25 MG PO TABS
50.0000 mg | ORAL_TABLET | Freq: Two times a day (BID) | ORAL | Status: DC
  Administered 2015-07-16 – 2015-07-17 (×2): 50 mg via ORAL
  Filled 2015-07-16 (×2): qty 2

## 2015-07-16 MED ORDER — ASPIRIN 81 MG PO CHEW: 81.0000 mg | CHEWABLE_TABLET | Freq: Every day | ORAL | Status: DC

## 2015-07-16 MED ORDER — SODIUM CHLORIDE 0.9 % IJ SOLN: 3.0000 mL | Freq: Two times a day (BID) | INTRAMUSCULAR | Status: DC

## 2015-07-16 MED ORDER — MORPHINE SULFATE (PF) 2 MG/ML IV SOLN
2.0000 mg | INTRAVENOUS | Status: DC | PRN
  Administered 2015-07-16: 2 mg via INTRAVENOUS
  Filled 2015-07-16: qty 1

## 2015-07-16 MED ORDER — NITROGLYCERIN 1 MG/10 ML FOR IR/CATH LAB
INTRA_ARTERIAL | Status: DC | PRN
  Administered 2015-07-16: 200 ug

## 2015-07-16 MED ORDER — SODIUM CHLORIDE 0.9 % IV SOLN: INTRAVENOUS | Status: AC

## 2015-07-16 MED ORDER — ACETAMINOPHEN 325 MG PO TABS: 650.0000 mg | ORAL_TABLET | ORAL | Status: DC | PRN

## 2015-07-16 MED ORDER — METOPROLOL TARTRATE 25 MG PO TABS: 50.0000 mg | ORAL_TABLET | Freq: Two times a day (BID) | ORAL | Status: DC

## 2015-07-16 MED ORDER — ASPIRIN 81 MG PO CHEW: 81.0000 mg | CHEWABLE_TABLET | ORAL | Status: DC

## 2015-07-16 SURGICAL SUPPLY — 17 items
BALLN EMERGE MR 3.0X20 (BALLOONS) ×2
BALLN ~~LOC~~ EMERGE MR 3.25X20 (BALLOONS) ×2
BALLOON EMERGE MR 3.0X20 (BALLOONS) ×1 IMPLANT
BALLOON ~~LOC~~ EMERGE MR 3.25X20 (BALLOONS) ×1 IMPLANT
CATH INFINITI 5FR JL4 (CATHETERS) ×2 IMPLANT
CATH INFINITI 5FR JL5 (CATHETERS) ×2 IMPLANT
GUIDE CATH RUNWAY 6FR AL 75 (CATHETERS) ×2 IMPLANT
KIT ENCORE 26 ADVANTAGE (KITS) ×2 IMPLANT
KIT HEART LEFT (KITS) ×2 IMPLANT
PACK CARDIAC CATHETERIZATION (CUSTOM PROCEDURE TRAY) ×2 IMPLANT
SHEATH PINNACLE 6F 10CM (SHEATH) ×2 IMPLANT
STENT PROMUS PREM MR 3.0X28 (Permanent Stent) ×2 IMPLANT
TRANSDUCER W/STOPCOCK (MISCELLANEOUS) ×2 IMPLANT
TUBING CIL FLEX 10 FLL-RA (TUBING) ×2 IMPLANT
WIRE ASAHI PROWATER 180CM (WIRE) ×2 IMPLANT
WIRE EMERALD 3MM-J .035X150CM (WIRE) ×2 IMPLANT
WIRE HI TORQ BMW 190CM (WIRE) ×2 IMPLANT

## 2015-07-16 NOTE — Progress Notes (Signed)
PHARMACIST - PHYSICIAN ORDER COMMUNICATION  CONCERNING: P&T Medication Policy on Herbal Medications  DESCRIPTION:  This patient's order for:  Resveratrol  has been noted.  This product(s) is classified as an "herbal" or natural product. Due to a lack of definitive safety studies or FDA approval, nonstandard manufacturing practices, plus the potential risk of unknown drug-drug interactions while on inpatient medications, the Pharmacy and Therapeutics Committee does not permit the use of "herbal" or natural products of this type within [facility].   ACTION TAKEN: The pharmacy department is unable to verify this order at this time and your patient has been informed of this safety policy. Please reevaluate patient's clinical condition at discharge and address if the herbal or natural product(s) should be resumed at that time.  Kaavya Puskarich S. Merilynn Finlandobertson, PharmD, BCPS Clinical Staff Pharmacist Pager [phone]

## 2015-07-16 NOTE — Interval H&P Note (Signed)
Cath Lab Visit (complete for each Cath Lab visit)  Clinical Evaluation Leading to the Procedure:   ACS: No.  Non-ACS:    Anginal Classification: CCS III  Anti-ischemic medical therapy: Maximal Therapy (2 or more classes of medications)  Non-Invasive Test Results: No non-invasive testing performed  Prior CABG: No previous CABG      History and Physical Interval Note:  07/16/2015 7:25 AM  Jennifer Grant  has presented today for surgery, with the diagnosis of CAD  The various methods of treatment have been discussed with the patient and family. After consideration of risks, benefits and other options for treatment, the patient has consented to  Procedure(s): Coronary Stent Intervention (N/A) as a surgical intervention .  The patient's history has been reviewed, patient examined, no change in status, stable for surgery.  I have reviewed the patient's chart and labs.  Questions were answered to the patient's satisfaction.     Lesleigh NoeSMITH III,HENRY W

## 2015-07-16 NOTE — Progress Notes (Signed)
Patient arrived to unit with sheath sutured in right groin, noted hematoma, marked, palpable right pedal pulse and CSMs wnls right lower extremity. Monitored frequently without change in assessment. Oozing from site noted when assessed. When dressing removed hematoma was deep but within marked area. Unable to palpate femoral artery about site. Cath lab called to assist in sheath pull. Jennifer Grant arrived and pulled sheathat 1430 holding above hematoma and pressure held to hematoma after bleeding controlled at site. Patient given morphine for pain. 1442 Patients BP was 66/47 heart rate 54, started bolus of fluids and nitroglycerin infusion discontinued. Patient verbalized feeling symptomatic with low BP. 1450 Patient felt better, BP 105/71, heart rate 52, returned IV fluids to 175/hr after 115 mls bolused over 10 minutes. No further problems, vital signs stable. 1500 after pressure held 30 minutes dressing applied, site bruised with slight hematoma noted. Site checked every 15 minutes then frequently throughout remainder of shift with no change in assessment.

## 2015-07-16 NOTE — H&P (View-Only) (Signed)
Patient ID: Jennifer LimeValerie E Bowen, female   DOB: 04/02/1951, 64 y.o.   MRN: [id number]    Date:  06/19/2015   ID:  Jennifer LimeValerie E Axtell, DOB 10/15/1950, MRN [id number]  PCP:  Vonita MossMark Crissman, MD  Primary Cardiologist:  Princella IonSmith   No chief complaint on file.    History of Present Illness: Jennifer Grant is a 64 y.o. female [place] female with PMH of HTN, HLD and CAD s/p multiple PCI presented as lateral STEMI on 06/08/2015. [facility] and [facility]. She was previously followed by Dr. Welton FlakesKhan who she has not seen for quite some time. She was not on dual antiplatelets therapy. She underwent cardiac catheterization revealed an EF 45-50%, DES (2.75 x 20 mm long Promus Premier) to occluded OM1, PTCA to in-stent restenosis of OM2, high grade multifocal instent restenosis of distal RCA and PDA to be treated later in several weeks. Echocardiogram was obtained on 06/09/2015 which showed EF 50-55%, severe hypokinesis of lateral myocardium, grade 1 diastolic dysfunction, severe LVH. Post cath, hemoglobin did go down from 14.1 to 11.6, hemoglobin and hematocrit was rechecked a few days later which continued to be stable. Right radial cath site did not reveal any obvious complication, and has 2+ distal radial pulse without any bleeding or hematoma. She had some hypokalemia which was repleted with potassium chloride.   She was seen in the morning of 06/11/2015, at which time her blood pressure has improved on Imdur and metoprolol. She is also on aspirin, Brilinta and Lipitor. She does have LDL of 160, cholesterol 213, HDL 34, it is encouraged for her to comply with statin medication.  The patient has residual RCA stenosis, will need to arrange 4-6 weeks PCI of RCA via R femoral approach per Dr. Katrinka BlazingSmith. During this admission, she does have Hgb A1C of 6.2,   Shannon presented for posthospital follow-up. She reports feeling fine does get some exertional dyspnea. Yesterday she said she got great and she had a  walk with her daughter. She sometimes feels a regular heartbeat.Marland Kitchen.  He denies any chest pain. She's been taken aspirin and Brilinta as directed.  Also reports having a headache. This may be related to the Imdur.  The patient currently denies nausea, vomiting, fever, orthopnea, dizziness, PND, cough, congestion, abdominal pain, hematochezia, melena, lower extremity edema, claudication.  Wt Readings from Last 3 Encounters:  06/19/15 224 lb 6.4 oz (101.787 kg)  06/18/15 226 lb (102.513 kg)  06/11/15 220 lb 14.4 oz (100.2 kg)     Past Medical History  Diagnosis Date  . Hyperlipidemia   . Mitral valve disorder   . Menopausal state     Current Outpatient Prescriptions  Medication Sig Dispense Refill  . aspirin 81 MG chewable tablet Chew 1 tablet (81 mg total) by mouth daily.    Marland Kitchen. atorvastatin (LIPITOR) 80 MG tablet Take 1 tablet (80 mg total) by mouth daily at 6 PM. 30 tablet 11  . isosorbide mononitrate (IMDUR) 60 MG 24 hr tablet Take 1 tablet (60 mg total) by mouth daily. 30 tablet 11  . metoprolol tartrate (LOPRESSOR) 50 MG tablet Take 1 tablet (50 mg total) by mouth 2 (two) times daily. 60 tablet 11  . Multiple Vitamin (MULTIVITAMIN WITH MINERALS) TABS tablet Take 1 tablet by mouth daily.    Marland Kitchen. RESVERATROL PO Take 1 tablet by mouth daily.    . ticagrelor (BRILINTA) 90 MG TABS tablet Take 1 tablet (90 mg total) by mouth 2 (  two) times daily. 60 tablet 11  . nitroGLYCERIN (NITROSTAT) 0.4 MG SL tablet Place 1 tablet (0.4 mg total) under the tongue every 5 (five) minutes x 3 doses as needed for chest pain. 25 tablet 3   No current facility-administered medications for this visit.    Allergies:    Allergies  Allergen Reactions  . Latex Other (See Comments)    Gloves make hands look black after prolonged use  . Lipitor [Atorvastatin] Itching    Mouth itching  . Penicillins Itching and Swelling    Tongue itching and lip swelling Has patient had a PCN reaction causing immediate rash,  facial/tongue/throat swelling, SOB or lightheadedness with hypotension: yes Has patient had a PCN reaction causing severe rash involving mucus membranes or skin necrosis: No Has patient had a PCN reaction that required hospitalization No Has patient had a PCN reaction occurring within the last 10 years: No If all of the above answers are "NO", then may proceed with Cephalosporin use.  Danie Chandler [Aliskiren] Rash    Social History:  The patient  reports that she has never smoked. She has never used smokeless tobacco. She reports that she does not drink alcohol or use illicit drugs.   Family history:   Family History  Problem Relation Age of Onset  . Cancer Neg Hx   . Diabetes Neg Hx   . Heart disease Neg Hx     ROS:  Please see the history of present illness.  All other systems reviewed and negative.   PHYSICAL EXAM: VS:  BP 162/120 mmHg  Pulse 88  Ht  (1.6 m)  Wt 224 lb 6.4 oz (101.787 kg)  BMI 39.76 kg/m2  SpO2 97% Obese, well developed, in no acute distress HEENT: Pupils are equal round react to light accommodation extraocular movements are intact.  Neck: no JVDNo cervical lymphadenopathy. Cardiac: Regular rate and rhythm without murmurs rubs or gallops. Lungs:  clear to auscultation bilaterally, no wheezing, rhonchi or rales Abd: soft, nontender, positive bowel sounds all quadrants, no hepatosplenomegaly Ext: no lower extremity edema.  2+ radial and dorsalis pedis pulses. Skin: warm and dry Neuro:  Grossly normal   ASSESSMENT AND PLAN:  Problem List Items Addressed This Visit    Hyperlipidemia   Relevant Medications   metoprolol tartrate (LOPRESSOR) 50 MG tablet   Essential hypertension   Relevant Medications   metoprolol tartrate (LOPRESSOR) 50 MG tablet   Coronary artery disease involving native heart - Primary (Chronic)   Relevant Medications   metoprolol tartrate (LOPRESSOR) 50 MG tablet   Other Relevant Orders   Lipid Profile   Hepatic function panel     Basic Metabolic Panel (BMET)   CBC with Differential   INR/PT   Chronic diastolic heart failure (HCC)   Relevant Medications   metoprolol tartrate (LOPRESSOR) 50 MG tablet      Coronary artery disease: Complaints of angina. She'll be scheduled for staged percutaneous coronary intervention of the distal RCA in approximate 4 weeks.  She is currently on aspirin, Brilinta, Lopressor 25 mg twice daily, Imdur 60 mg and statin.  Will titrate her Lopressor to 50 mg twice daily for better blood pressure control.  Essential hypertension: I rechecked her blood pressure was 152/110. Yesterday she said it was partially 146/89. Will increase her Lopressor to 50 mg twice daily.  Hyperlipidemia: Continue statin which she was not on before. Check LFTs and lipid panel with precath labs.   Chronic diastolic heart failure:  Patient appears euvolemic  obesity, BMI 39: Patient be referred to a registered dietitian to help facilitate weight loss.

## 2015-07-17 ENCOUNTER — Other Ambulatory Visit: Payer: Self-pay | Admitting: Physician Assistant

## 2015-07-17 ENCOUNTER — Encounter: Payer: Commercial Managed Care - HMO | Admitting: Family Medicine

## 2015-07-17 DIAGNOSIS — I25110 Atherosclerotic heart disease of native coronary artery with unstable angina pectoris: Secondary | ICD-10-CM

## 2015-07-17 DIAGNOSIS — T82857D Stenosis of cardiac prosthetic devices, implants and grafts, subsequent encounter: Secondary | ICD-10-CM | POA: Diagnosis not present

## 2015-07-17 DIAGNOSIS — T82855A Stenosis of coronary artery stent, initial encounter: Secondary | ICD-10-CM | POA: Diagnosis not present

## 2015-07-17 DIAGNOSIS — E876 Hypokalemia: Secondary | ICD-10-CM

## 2015-07-17 DIAGNOSIS — I10 Essential (primary) hypertension: Secondary | ICD-10-CM

## 2015-07-17 DIAGNOSIS — E785 Hyperlipidemia, unspecified: Secondary | ICD-10-CM

## 2015-07-17 DIAGNOSIS — I110 Hypertensive heart disease with heart failure: Secondary | ICD-10-CM | POA: Diagnosis not present

## 2015-07-17 DIAGNOSIS — I5032 Chronic diastolic (congestive) heart failure: Secondary | ICD-10-CM | POA: Diagnosis not present

## 2015-07-17 DIAGNOSIS — I2510 Atherosclerotic heart disease of native coronary artery without angina pectoris: Secondary | ICD-10-CM | POA: Diagnosis not present

## 2015-07-17 DIAGNOSIS — I25118 Atherosclerotic heart disease of native coronary artery with other forms of angina pectoris: Secondary | ICD-10-CM | POA: Diagnosis not present

## 2015-07-17 DIAGNOSIS — I97410 Intraoperative hemorrhage and hematoma of a circulatory system organ or structure complicating a cardiac catheterization: Secondary | ICD-10-CM | POA: Diagnosis not present

## 2015-07-17 DIAGNOSIS — I252 Old myocardial infarction: Secondary | ICD-10-CM | POA: Diagnosis not present

## 2015-07-17 DIAGNOSIS — I9581 Postprocedural hypotension: Secondary | ICD-10-CM | POA: Diagnosis not present

## 2015-07-17 LAB — CBC
HCT: 36.6 % (ref 36.0–46.0)
Hemoglobin: 11.8 g/dL — ABNORMAL LOW (ref 12.0–15.0)
MCH: 25.8 pg — ABNORMAL LOW (ref 26.0–34.0)
MCHC: 32.2 g/dL (ref 30.0–36.0)
MCV: 79.9 fL (ref 78.0–100.0)
PLATELETS: 192 10*3/uL (ref 150–400)
RBC: 4.58 MIL/uL (ref 3.87–5.11)
RDW: 15.8 % — AB (ref 11.5–15.5)
WBC: 6.2 10*3/uL (ref 4.0–10.5)

## 2015-07-17 LAB — BASIC METABOLIC PANEL
Anion gap: 7 (ref 5–15)
BUN: 9 mg/dL (ref 6–20)
CALCIUM: 9.2 mg/dL (ref 8.9–10.3)
CO2: 24 mmol/L (ref 22–32)
CREATININE: 0.69 mg/dL (ref 0.44–1.00)
Chloride: 104 mmol/L (ref 101–111)
GFR calc Af Amer: >60 mL/min (ref >60)
GLUCOSE: 105 mg/dL — AB (ref 65–99)
POTASSIUM: 3.1 mmol/L — AB (ref 3.5–5.1)
SODIUM: 135 mmol/L (ref 135–145)

## 2015-07-17 MED ORDER — LOSARTAN POTASSIUM 50 MG PO TABS: 25.0000 mg | ORAL_TABLET | Freq: Every day | ORAL | Status: DC

## 2015-07-17 MED ORDER — ISOSORBIDE MONONITRATE ER 60 MG PO TB24
60.0000 mg | ORAL_TABLET | Freq: Every day | ORAL | Status: DC
  Administered 2015-07-17: 60 mg via ORAL
  Filled 2015-07-17: qty 1

## 2015-07-17 MED ORDER — POTASSIUM CHLORIDE CRYS ER 20 MEQ PO TBCR: 20.0000 meq | EXTENDED_RELEASE_TABLET | Freq: Every day | ORAL | Status: DC

## 2015-07-17 MED ORDER — POTASSIUM CHLORIDE CRYS ER 20 MEQ PO TBCR
40.0000 meq | EXTENDED_RELEASE_TABLET | ORAL | Status: AC
  Administered 2015-07-17 (×2): 40 meq via ORAL
  Filled 2015-07-17: qty 2

## 2015-07-17 NOTE — Discharge Summary (Signed)
Discharge Summary   Patient ID: Jennifer Grant,  MRN: [id number], DOB/AGE: Jan 07, 1951 64 y.o.  Admit date: 07/16/2015 Discharge date: 07/17/2015  Primary Care Provider: Vonita Moss Primary Cardiologist: Dr. Katrinka Blazing  Discharge Diagnoses Principal Problem:   CAD (coronary artery disease), native coronary artery Active Problems:   Essential hypertension   Hyperlipidemia   Coronary artery disease involving native heart   Coronary stent restenosis due to progression of disease   Dyspnea   Allergies Allergies  Allergen Reactions  . Latex Other (See Comments)    Gloves make hands look black after prolonged use  . Penicillins Itching and Swelling    Tongue itching and lip swelling Has patient had a PCN reaction causing immediate rash, facial/tongue/throat swelling, SOB or lightheadedness with hypotension: yes Has patient had a PCN reaction causing severe rash involving mucus membranes or skin necrosis: No Has patient had a PCN reaction that required hospitalization No Has patient had a PCN reaction occurring within the last 10 years: No If all of the above answers are "NO", then may proceed with Cephalosporin use.  Danie Chandler [Aliskiren] Rash    Procedures  Cardiac catheterization 07/16/2015 Conclusion    1. Dist LAD lesion, 75% stenosed. 2. Mid RCA to Dist RCA lesion, 90% stenosed. Post intervention, there is a 0% residual stenosis. The lesion was previously treated with a stent (unknown type).   Widely patent left coronary stents without change in anatomy since acute intervention earlier this year.  90% stenoses in the proximal to mid distal RCA overlapping stents reduced to 0% after angioplasty and placement of a 28 x 3.0 Promus Premier. Final balloon diameter 3.25 mm. TIMI grade 3 flow is noted.    RECOMMENDATIONS:   Sheath removal once ACT is less than 175 seconds.  Ambulate later today.  Continue aspirin and brought lentiform at least 12  months  Anticipate discharge in a.m.  Discontinue nitroglycerin after sheath is removed.  Follow-up Dr. Verdis Prime 4-6 weeks      Hospital Course  Jennifer Grant is a 64 yo Jennifer Grant with PMH of HTN, HLD, and CAD s/p multiple PCI presented with lateral STEMI on 06/08/2015. She had 4 stents placed in December 2004 at [facility] in 2 additional stents placed in 2005 at [facility]. He underwent emergent cath on 06/08/2015 which showed EF 45-50%, DES (2.75 x 20 mm long Promus Premier) to occluded OM1, PTCA to in-stent restenosis of OM2, high grade multifocal instent restenosis of distal RCA and PDA to be treated later in several weeks.  She was seen in the clinic on 06/19/2015 for post hospital follow-up, at which time she denies any obvious chest discomfort however reports having headache. She still has some exertional dyspnea as a time. Her Lopressor was increased to 50 mg twice a day for better blood pressure control. She was arranged to have staged intervention to RCA. She presented to [facility] on 07/16/2015 for the planned procedure. Cath showed 70% distal LAD treated medically, 90% mid to distal RCA lesion treated with 28x3.0 mm Promus Premier DES. Post cath, she did have an episode of vasovagal hypotension during sheath pull, however her blood pressure quickly improved with only 150 mL of IV fluid. She was kept in the hospital overnight for observation. Her blood pressure was hypertensive overnight. She was seen in the morning of 07/17/2015 at which time she denies any obvious chest discomfort or shortness breath. Her right femoral cath site appears to be stable without significant bleeding  or hematoma. She did not have any significant bruit or pulsatile mass on exam. She ambulated with cardiac rehabilitation without significant discomfort. She is deemed stable for discharge from cardiology perspective. Emphasis has been placed on compliance with dual antiplatelets  therapy. Close outpatient follow-up has been previously arranged by Dr. Katrinka BlazingSmith.  Of note, her current blood pressure is 160, she has taken her Imdur roughly 1 hour ago, if her blood pressure does not come down below 150 prior to discharge, I will give her low-dose losartan to help further manage blood pressure. Of note, during this admission, she had hypokalemia with potassium of 3.1, I have repleted with 40 mEq 2 prior to discharge, and will start her on 20 mEq daily. She will need to obtain basic metabolic panel in one week to assess potassium level.   Discharge Vitals Blood pressure 150/83, pulse 71, temperature 97.9 F (36.6 C), temperature source Oral, resp. rate 14, weight 224 lb 13.9 oz (102 kg), SpO2 99 %.  Filed Weights   07/16/15 0700 07/17/15 0346  Weight: 224 lb (101.606 kg) 224 lb 13.9 oz (102 kg)    Labs  CBC  Recent Labs  07/15/15 1005 07/17/15 0359  WBC 5.5 6.2  NEUTROABS 2.7  --   HGB 12.9 11.8*  HCT 40.9 36.6  MCV 78.6* 79.9  PLT 213 192   Basic Metabolic Panel  Recent Labs  07/15/15 1005 07/17/15 0359  NA 138 135  K 3.5 3.1*  CL 102 104  CO2 29 24  GLUCOSE 103* 105*  BUN 11 9  CREATININE 0.72 0.69  CALCIUM 10.0 9.2   Liver Function Tests  Recent Labs  07/15/15 1005  AST 20  ALT 16  ALKPHOS 71  BILITOT 0.9  PROT 7.9  ALBUMIN 3.5   Fasting Lipid Panel  Recent Labs  07/16/15 0733  CHOL 120  HDL 32*  LDLCALC 78  TRIG 51  CHOLHDL 3.8    Disposition  Pt is being discharged home today in good condition.  Follow-up Plans & Appointments      Follow-up Information    [facility] [address] [facility] On 07/23/2015.   Specialty:  Cardiology   Why:  Obtain BMET in 1 week to check potassium level   Contact information:   [address], [address] [place] [id number] [phone]      Follow up with Lesleigh NoeSMITH III,HENRY W, MD On 07/29/2015.   Specialty:  Cardiology   Why:  2:00pm. Cardiology followup    Contact information:   [address] [place] [address] [phone]       Discharge Medications    Medication List    TAKE these medications        aspirin 81 MG chewable tablet  Chew 1 tablet (81 mg total) by mouth daily.     atorvastatin 80 MG tablet  Commonly known as:  LIPITOR  Take 1 tablet (80 mg total) by mouth daily at 6 PM.     isosorbide mononitrate 60 MG 24 hr tablet  Commonly known as:  IMDUR  Take 1 tablet (60 mg total) by mouth daily.     metoprolol 50 MG tablet  Commonly known as:  LOPRESSOR  Take 1 tablet (50 mg total) by mouth 2 (two) times daily.     multivitamin with minerals Tabs tablet  Take 1 tablet by mouth daily.     nitroGLYCERIN 0.4 MG SL tablet  Commonly known as:  NITROSTAT  Place 1 tablet (0.4 mg total) under the tongue every 5 (five) minutes x 3 doses as needed for chest pain.     potassium chloride SA 20 MEQ tablet  Commonly known as:  K-DUR,KLOR-CON  Take 1 tablet (20 mEq total) by mouth daily.  Start taking on:  07/18/2015     RESVERATROL PO  Take 1 tablet by mouth daily.     ticagrelor 90 MG Tabs tablet  Commonly known as:  BRILINTA  Take 1 tablet (90 mg total) by mouth 2 (two) times daily.        Outstanding Labs/Studies  BMET in 1 week  Duration of Discharge Encounter   Greater than 30 minutes including physician time.  Ramond Dial PA-C Pager: [phone] 07/17/2015, 10:30 AM

## 2015-07-17 NOTE — Discharge Instructions (Signed)
No driving for 24 hours. No lifting over 5 lbs for 1 week. No sexual activity for 1 week. Keep procedure site clean & dry. If you notice increased pain, swelling, bleeding or pus, call/return!  You may shower, but no soaking baths/hot tubs/pools for 1 week.  ° ° °

## 2015-07-17 NOTE — Progress Notes (Addendum)
Patient Name: Jennifer Grant Date of Encounter: 07/17/2015  Primary Cardiologist: Dr. Katrinka BlazingSmith   Principal Problem:   CAD (coronary artery disease), native coronary artery Active Problems:   Essential hypertension   Hyperlipidemia   Coronary artery disease involving native heart   Coronary stent restenosis due to progression of disease   Dyspnea    SUBJECTIVE  Denies any CP or SOB.   CURRENT MEDS . angioplasty book   Does not apply Once  . aspirin  81 mg Oral Daily  . atorvastatin  80 mg Oral q1800  . isosorbide mononitrate  60 mg Oral Daily  . metoprolol tartrate  50 mg Oral BID  . multivitamin with minerals  1 tablet Oral Daily  . [START ON 07/18/2015] potassium chloride  20 mEq Oral Daily  . potassium chloride  40 mEq Oral Q2H  . sodium chloride  3 mL Intravenous Q12H  . ticagrelor  90 mg Oral BID    OBJECTIVE  Filed Vitals:   07/16/15 2000 07/16/15 2015 07/17/15 0346 07/17/15 0737  BP:  135/65 150/83   Pulse:  65 55 71  Temp:  98.1 F (36.7 C) 98.2 F (36.8 C) 97.9 F (36.6 C)  TempSrc:  Oral Oral Oral  Resp: 15 19 14    Weight:   224 lb 13.9 oz (102 kg)   SpO2:  99% 98% 99%    Intake/Output Summary (Last 24 hours) at 07/17/15 0837 Last data filed at 07/16/15 1825  Gross per 24 hour  Intake   2000 ml  Output      1 ml  Net   1999 ml   Filed Weights   07/16/15 0700 07/17/15 0346  Weight: 224 lb (101.606 kg) 224 lb 13.9 oz (102 kg)    PHYSICAL EXAM  General: Pleasant, NAD. Neuro: Alert and oriented X 3. Moves all extremities spontaneously. Psych: Normal affect. HEENT:  Normal  Neck: Supple without bruits or JVD. Lungs:  Resp regular and unlabored, CTA. Heart: RRR no s3, s4, or murmurs. R femoral cath site stable, no obvious hematoma, still sore to palpation. No bruit or pulsatile mass Abdomen: Soft, non-tender, non-distended, BS + x 4.  Extremities: No clubbing, cyanosis or edema. DP/PT/Radials 2+ and equal bilaterally.  Accessory Clinical  Findings  CBC  Recent Labs  07/15/15 1005 07/17/15 0359  WBC 5.5 6.2  NEUTROABS 2.7  --   HGB 12.9 11.8*  HCT 40.9 36.6  MCV 78.6* 79.9  PLT 213 192   Basic Metabolic Panel  Recent Labs  07/15/15 1005 07/17/15 0359  NA 138 135  K 3.5 3.1*  CL 102 104  CO2 29 24  GLUCOSE 103* 105*  BUN 11 9  CREATININE 0.72 0.69  CALCIUM 10.0 9.2   Liver Function Tests  Recent Labs  07/15/15 1005  AST 20  ALT 16  ALKPHOS 71  BILITOT 0.9  PROT 7.9  ALBUMIN 3.5   Fasting Lipid Panel  Recent Labs  07/16/15 0733  CHOL 120  HDL 32*  LDLCALC 78  TRIG 51  CHOLHDL 3.8    TELE NSR without significant ventricular ectopy    ECG  NSR with TWI in 1 and aVL  Echocardiogram 06/09/2015  LV EF: 50% -  55%  ------------------------------------------------------------------- Indications:   CHF - 428.0.  ------------------------------------------------------------------- History:  PMH: STEMI Lateral wall. Coronary artery disease. Risk factors: Hypertension. Dyslipidemia.  ------------------------------------------------------------------- Study Conclusions  - Left ventricle: Wall thickness was increased in a pattern of severe LVH. Systolic function was  normal. The estimated ejection fraction was in the range of 50% to 55%. There is severe hypokinesis of the lateral myocardium. Doppler parameters are consistent with abnormal left ventricular relaxation (grade 1 diastolic dysfunction). - Left atrium: The atrium was mildly dilated. - Right ventricle: Systolic function was mildly reduced.    Radiology/Studies  No results found.  ASSESSMENT AND PLAN  64 yo female [place] female with PMH of HTN, HLD, and CAD s/p multiple PCI presented with lateral STEMI on 06/08/2015. Underwent DES to occluded OM1 and PTCA to ISR of OM2. She had high grade multifocal ISR in distal RCA and PDA to be treated later. She presented to staged PCI of RCA  1. CAD  - 4  stents placed in December 2004 at [facility] in 2 additional stents placed in 2005 at [facility].. - Echo 06/09/2015 EF 50-55%, severe hypokinesis of lateral myocardium, grade 1 diastolic dysfunction, severe LVH  - cath 06/08/2015 EF 45-50%, DES (2.75 x 20 mm long Promus Premier) to occluded OM1, PTCA to in-stent restenosis of OM2, high grade multifocal instent restenosis of distal RCA and PDA to be treated later in several weeks  - staged PCI 07/16/2015 70% distal LAD treated medically, 90% mid to distal RCA lesion treated with 28x3.0 mm Promus Premier DES  - continue ASA, brilinta, metoprolol. SBP high 150-170s this morning, will restart Imdur  daily  2. Hypokalemia: K 3.1, replete with 2 doses of KCL today, starting KCL tomorrow. Recheck BMET in 1 week   3. HTN: metoprolol recently increased to  BID, currently in 150-180s, restart Imdur. If still high on followup, will add  Losartan  4. HLD  5. Pre-diabetes  6. Mild anemia: had hematoma post cath on 11/16, resolved, no pulsatile mass or bruit on exam to suggest pseudoaneurysm, R groin still slightly sore.   7. Vasovagal: SBP temp dip down to 80s yesterday, quickly bounced back with only 115 ml of IV fluid, now hypertensive  Signed, Amedeo Plenty Pager: [phone]   The patient was seen, examined and discussed with Azalee Course, PA-C and I agree with the above.   64 yo female h/o of HTN, HLD, and CAD s/p multiple PCI presented with lateral STEMI on 06/08/2015. Underwent DES to occluded OM1 and PTCA to ISR of OM2. She had high grade multifocal ISR in distal RCA and PDA to be treated later. She presented to staged PCI of RCA,  90% mid to distal RCA lesion treated with 28x3.0 mm Promus Premier DES.  Continue ASA, brilinta, metoprolol, atorvastatin. Restart Imdur as she is hypertensive. We will arrange for an early follow up. Replace potassium.   Lars Masson 07/17/2015

## 2015-07-17 NOTE — Care Management Note (Signed)
Case Management Note  Patient Details  Name: Jennifer Grant MRN: [id number] Date of Birth: 07/08/1951  Subjective/Objective:        64 y.o. F to be discharged on Brilinta, Bedside RN reports was taking this pta.             Action/Plan: No CM needs at present, will be available should discharge needs arise.    Expected Discharge Date:                  Expected Discharge Plan:     In-House Referral:     Discharge planning Services     Post Acute Care Choice:    Choice offered to:     DME Arranged:    DME Agency:     HH Arranged:    HH Agency:     Status of Service:     Medicare Important Message Given:    Date Medicare IM Given:    Medicare IM give by:    Date Additional Medicare IM Given:    Additional Medicare Important Message give by:     If discussed at Long Length of Stay Meetings, dates discussed:    Additional Comments:  Jennifer Grant, Jennifer Gonsalves M, RN 07/17/2015, 9:23 AM

## 2015-07-17 NOTE — Progress Notes (Signed)
CARDIAC REHAB PHASE I   PRE:  Rate/Rhythm: 73 SR with PVCs    BP: sitting 158/98    SaO2:   MODE:  Ambulation: 1000 ft   POST:  Rate/Rhythm: 83 SR    BP: sitting 182/118, rest 6 min 161/108     SaO2:   Pt able to walk without major groin pain. Rest x1 for fatigue with long distance. BP very elevated. Encouraged pt to take her BP at home and keep a journal. Reviewed ed with pt. Will resend CRPII referral to [place]. Understands importance of Brilinta. Encouraged pt to have Hbg A1C checked every 6 months and discussed managing her carbs and exercising daily. [phone]   Jennifer Grant, Jennifer Grant Jennifer Grant CES, ACSM 07/17/2015 8:51 AM

## 2015-07-23 ENCOUNTER — Ambulatory Visit (INDEPENDENT_AMBULATORY_CARE_PROVIDER_SITE_OTHER): Payer: Commercial Managed Care - HMO | Admitting: Family Medicine

## 2015-07-23 ENCOUNTER — Other Ambulatory Visit (INDEPENDENT_AMBULATORY_CARE_PROVIDER_SITE_OTHER): Payer: Commercial Managed Care - HMO | Admitting: *Deleted

## 2015-07-23 ENCOUNTER — Encounter: Payer: Self-pay | Admitting: Family Medicine

## 2015-07-23 VITALS — BP 179/105 | HR 67 | Temp 97.3°F | Ht 63.2 in | Wt 227.0 lb

## 2015-07-23 DIAGNOSIS — I2510 Atherosclerotic heart disease of native coronary artery without angina pectoris: Secondary | ICD-10-CM

## 2015-07-23 DIAGNOSIS — E785 Hyperlipidemia, unspecified: Secondary | ICD-10-CM

## 2015-07-23 DIAGNOSIS — I10 Essential (primary) hypertension: Secondary | ICD-10-CM

## 2015-07-23 LAB — HEPATIC FUNCTION PANEL
ALBUMIN: 3.9 g/dL (ref 3.6–5.1)
ALK PHOS: 76 U/L (ref 33–130)
ALT: 13 U/L (ref 6–29)
AST: 23 U/L (ref 10–35)
Bilirubin, Direct: 0.2 mg/dL (ref <=0.2)
Indirect Bilirubin: 1 mg/dL (ref 0.2–1.2)
TOTAL PROTEIN: 8 g/dL (ref 6.1–8.1)
Total Bilirubin: 1.2 mg/dL (ref 0.2–1.2)

## 2015-07-23 LAB — BASIC METABOLIC PANEL
BUN: 10 mg/dL (ref 7–25)
CALCIUM: 9.9 mg/dL (ref 8.6–10.4)
CO2: 23 mmol/L (ref 20–31)
CREATININE: 0.7 mg/dL (ref 0.50–0.99)
Chloride: 101 mmol/L (ref 98–110)
Glucose, Bld: 69 mg/dL (ref 65–99)
POTASSIUM: 4.3 mmol/L (ref 3.5–5.3)
Sodium: 134 mmol/L — ABNORMAL LOW (ref 135–146)

## 2015-07-23 LAB — CBC WITH DIFFERENTIAL/PLATELET

## 2015-07-23 LAB — PROTIME-INR
INR: 1.03 (ref <1.50)
Prothrombin Time: 13.6 seconds (ref 11.6–15.2)

## 2015-07-23 MED ORDER — LOSARTAN POTASSIUM 100 MG PO TABS: 100.0000 mg | ORAL_TABLET | Freq: Every day | ORAL | Status: DC

## 2015-07-23 MED ORDER — AMLODIPINE BESYLATE 10 MG PO TABS: 10.0000 mg | ORAL_TABLET | Freq: Every day | ORAL | Status: DC

## 2015-07-23 NOTE — Assessment & Plan Note (Signed)
Encourage patient to be sure and take Lipitor faithfully

## 2015-07-23 NOTE — Assessment & Plan Note (Signed)
Discussed with patient proper risk factor modification and importance for health Discussed diet exercise nutrition Will continue taking Brilinta

## 2015-07-23 NOTE — Progress Notes (Signed)
BP 179/105 mmHg  Pulse 67  Temp(Src) 97.3 F (36.3 C)  Ht 5' 3.2" (1.605 m)  Wt 227 lb (102.967 kg)  BMI 39.97 kg/m2  SpO2 99%   Subjective:    Patient ID: Jennifer Grant, female    DOB: May 09, 1951, 64 y.o.   MRN: [id number]  HPI: Jennifer Grant is a 64 y.o. female  Chief Complaint  Patient presents with  . Hospitalization Follow-up   patient hospitalized for coronary artery disease stent placement Patient doing okay since out of the hospital blood pressures gone back up hasn't restarted her losartan or amlodipine that she has at home yet. Patient is taking her cholesterol medicine faithfully Energy is down with exertion. Taking her isosorbide every day.  Relevant past medical, surgical, family and social history reviewed and updated as indicated. Interim medical history since our last visit reviewed. Allergies and medications reviewed and updated.  Review of Systems  Constitutional: Positive for fatigue. Negative for fever, chills and diaphoresis.  Respiratory: Negative.   Cardiovascular: Negative for chest pain, palpitations and leg swelling.  Gastrointestinal: Negative.     Per HPI unless specifically indicated above     Objective:    BP 179/105 mmHg  Pulse 67  Temp(Src) 97.3 F (36.3 C)  Ht 5' 3.2" (1.605 m)  Wt 227 lb (102.967 kg)  BMI 39.97 kg/m2  SpO2 99%  Wt Readings from Last 3 Encounters:  07/23/15 227 lb (102.967 kg)  07/17/15 224 lb 13.9 oz (102 kg)  06/19/15 224 lb 6.4 oz (101.787 kg)    Physical Exam  Constitutional: She is oriented to person, place, and time. She appears well-developed and well-nourished. No distress.  HENT:  Head: Normocephalic and atraumatic.  Right Ear: Hearing normal.  Left Ear: Hearing normal.  Nose: Nose normal.  Eyes: Conjunctivae and lids are normal. Right eye exhibits no discharge. Left eye exhibits no discharge. No scleral icterus.  Cardiovascular: Normal rate, regular rhythm and normal heart sounds.    Pulmonary/Chest: Effort normal and breath sounds normal. No respiratory distress.  Musculoskeletal: Normal range of motion.  Neurological: She is alert and oriented to person, place, and time.  Skin: Skin is intact. No rash noted.  Psychiatric: She has a normal mood and affect. Her speech is normal and behavior is normal. Judgment and thought content normal. Cognition and memory are normal.    Results for orders placed or performed in visit on 07/23/15  Basic metabolic panel  Result Value Ref Range   Sodium  135 - 146 mmol/L   Potassium  3.5 - 5.3 mmol/L   Chloride  98 - 110 mmol/L   CO2  20 - 31 mmol/L   Glucose, Bld  65 - 99 mg/dL   BUN  7 - 25 mg/dL   Creat  7.82 - 9.56 mg/dL   Calcium  8.6 - 21.3 mg/dL  CBC with Differential/Platelet  Result Value Ref Range   WBC  4.0 - 10.5 K/uL   RBC  3.87 - 5.11 MIL/uL   Hemoglobin  12.0 - 15.0 g/dL   HCT  08.6 - 57.8 %   MCV  78.0 - 100.0 fL   MCH  26.0 - 34.0 pg   MCHC  30.0 - 36.0 g/dL   RDW  46.9 - 62.9 %   Platelets  150 - 400 K/uL   MPV  8.6 - 12.4 fL   Neutrophils Relative %  43 - 77 %   Neutro Abs  1.7 - 7.7 K/uL  Lymphocytes Relative  12 - 46 %   Lymphs Abs  0.7 - 4.0 K/uL   Monocytes Relative  3 - 12 %   Monocytes Absolute  0.1 - 1.0 K/uL   Eosinophils Relative  0 - 5 %   Eosinophils Absolute  0.0 - 0.7 K/uL   Basophils Relative  0 - 1 %   Basophils Absolute  0.0 - 0.1 K/uL   Smear Review    Protime-INR  Result Value Ref Range   Prothrombin Time 13.6 11.6 - 15.2 seconds   INR 1.03 <1.50  Hepatic function panel  Result Value Ref Range   Total Bilirubin  0.2 - 1.2 mg/dL   Bilirubin, Direct  <=9.5<=0.2 mg/dL   Indirect Bilirubin  0.2 - 1.2 mg/dL   Alkaline Phosphatase  40 - 115 U/L   AST  10 - 35 U/L   ALT  9 - 46 U/L   Total Protein  6.1 - 8.1 g/dL   Albumin  3.6 - 5.1 g/dL      Assessment & Plan:   Problem List Items Addressed This Visit      Cardiovascular and Mediastinum   Essential hypertension - Primary     Blood pressure poor control after getting out of the hospital will restart amlodipine 10 mg restart losartan 100 mg continue other medications Discussed with patient importance of maintaining good blood pressure      Relevant Medications   losartan (COZAAR) 100 MG tablet   amLODipine (NORVASC) 10 MG tablet   Coronary artery disease involving native heart (Chronic)    Discussed with patient proper risk factor modification and importance for health Discussed diet exercise nutrition Will continue taking Brilinta      Relevant Medications   losartan (COZAAR) 100 MG tablet   amLODipine (NORVASC) 10 MG tablet     Other   Hyperlipidemia    Encourage patient to be sure and take Lipitor faithfully      Relevant Medications   losartan (COZAAR) 100 MG tablet   amLODipine (NORVASC) 10 MG tablet       Follow up plan: Return in about 2 weeks (around 08/06/2015) for 2 weeks or so recheck blood pressure, BMP, lipid panel, ALT, AST.

## 2015-07-23 NOTE — Assessment & Plan Note (Signed)
Blood pressure poor control after getting out of the hospital will restart amlodipine 10 mg restart losartan 100 mg continue other medications Discussed with patient importance of maintaining good blood pressure

## 2015-07-29 ENCOUNTER — Encounter: Payer: Self-pay | Admitting: Interventional Cardiology

## 2015-07-29 ENCOUNTER — Ambulatory Visit (INDEPENDENT_AMBULATORY_CARE_PROVIDER_SITE_OTHER): Payer: Commercial Managed Care - HMO | Admitting: Interventional Cardiology

## 2015-07-29 ENCOUNTER — Telehealth: Payer: Self-pay | Admitting: Interventional Cardiology

## 2015-07-29 VITALS — BP 138/82 | HR 57 | Ht 62.0 in | Wt 227.0 lb

## 2015-07-29 DIAGNOSIS — I10 Essential (primary) hypertension: Secondary | ICD-10-CM

## 2015-07-29 DIAGNOSIS — I5032 Chronic diastolic (congestive) heart failure: Secondary | ICD-10-CM | POA: Diagnosis not present

## 2015-07-29 DIAGNOSIS — I2510 Atherosclerotic heart disease of native coronary artery without angina pectoris: Secondary | ICD-10-CM

## 2015-07-29 DIAGNOSIS — E785 Hyperlipidemia, unspecified: Secondary | ICD-10-CM | POA: Diagnosis not present

## 2015-07-29 MED ORDER — PRASUGREL HCL 10 MG PO TABS: 10.0000 mg | ORAL_TABLET | Freq: Every day | ORAL | Status: DC

## 2015-07-29 NOTE — Telephone Encounter (Signed)
Pt sts that she went to pick up her new Rx for Effient and the out of pocket cost  would be $500 a month. Pt sts that her pharmacy told her should would need a prior auth. Adv pt that I will update Dr.Smith, she should continue Brilinta with no interruption until we can complete the prior auth process. Pt verbalized understanding.

## 2015-07-29 NOTE — Patient Instructions (Addendum)
Medication Instructions:  1) TAKE your last dose of Brilinta this evening 2) START Effient 10mg  daily this evening along with your last dose of Brilinta. Starting tomorrow continue Effient 10mg  each morning  Labwork: None ordered   Testing/Procedures: None ordered  Follow-Up: Your physician recommends that you schedule a follow-up appointment in: 3 months      Any Other Special Instructions Will Be Listed Below (If Applicable). Call the office on Thursday or Friday with update of your shortness of breath     If you need a refill on your cardiac medications before your next appointment, please call your pharmacy.

## 2015-07-29 NOTE — Telephone Encounter (Signed)
New Message    Pt calling stating that she was calling Misty StanleyLisa back to talk about her medications. Please call back and advise.

## 2015-07-29 NOTE — Progress Notes (Signed)
Cardiology Office Note   Date:  07/29/2015   ID:  Jennifer LimeValerie E Courter, DOB 01/17/1951, MRN [id number]  PCP:  Vonita MossMark Crissman, MD  Cardiologist:  Lesleigh NoeSMITH III,HENRY W, MD   Chief Complaint  Patient presents with  . Chest Pain      History of Present Illness: Jennifer Grant is a 64 y.o. female who presents for coronary artery disease, prior circumflex and right coronary stents with restenosis, recent acute lateral wall infarct due to circumflex occlusion, restenting of circumflex and subsequent restenting of distal right coronary due to restenosis. History of significant essential hypertension, diastolic heart failure, hyperlipidemia, and obesity.  Since the most recent myocardial infarction she has suffered with episodes of dyspnea. She feels it may be related to Lipitor. She denies orthopnea. She states the dyspnea started after the myocardial infarction. Angina has been a sensation of indigestion/burning. She has not had this type sensation before.    Past Medical History  Diagnosis Date  . Hyperlipidemia   . Mitral valve disorder   . Menopausal state   . Anginal pain (HCC)   . Myocardial infarction [facility](HCC) 2004; 06/06/2015  . GERD (gastroesophageal reflux disease)   . 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     "got booster shot a few times"    Past Surgical History  Procedure Laterality Date  . Knee arthroscopy Left 1990's  . Coronary angioplasty with stent placement      "6 stents before 05/2015"  . Cardiac catheterization N/A 06/08/2015    Procedure: Left Heart Cath and Coronary Angiography;  Surgeon: Lyn RecordsHenry W Smith, MD;  [facility];  Service: Cardiovascular;  Laterality: N/A;  . Cardiac catheterization N/A 06/08/2015    Procedure: Coronary Stent Intervention;  Surgeon: Lyn RecordsHenry W Smith, MD;  [facility];  Service: Cardiovascular;  Laterality: N/A;  . Cardiac catheterization N/A 07/16/2015    Procedure: Coronary Stent Intervention;  Surgeon: Lyn RecordsHenry W Smith, MD;   [facility];  Service: Cardiovascular;  Laterality: N/A;  . Tubal ligation  ~ 1978     Current Outpatient Prescriptions  Medication Sig Dispense Refill  . amLODipine (NORVASC) 10 MG tablet Take 1 tablet (10 mg total) by mouth daily. 90 tablet 3  . aspirin 81 MG chewable tablet Chew 1 tablet (81 mg total) by mouth daily.    Marland Kitchen. atorvastatin (LIPITOR) 80 MG tablet Take 1 tablet (80 mg total) by mouth daily at 6 PM. 30 tablet 11  . isosorbide mononitrate (IMDUR) 60 MG 24 hr tablet Take 1 tablet (60 mg total) by mouth daily. 30 tablet 11  . losartan (COZAAR) 100 MG tablet Take 1 tablet (100 mg total) by mouth daily. 90 tablet 1  . metoprolol tartrate (LOPRESSOR) 50 MG tablet Take 1 tablet (50 mg total) by mouth 2 (two) times daily. 60 tablet 11  . Multiple Vitamin (MULTIVITAMIN WITH MINERALS) TABS tablet Take 1 tablet by mouth daily.    . nitroGLYCERIN (NITROSTAT) 0.4 MG SL tablet Place 1 tablet (0.4 mg total) under the tongue every 5 (five) minutes x 3 doses as needed for chest pain. 25 tablet 3  . potassium chloride SA (K-DUR,KLOR-CON) 20 MEQ tablet Take 1 tablet (20 mEq total) by mouth daily. 30 tablet 3  . RESVERATROL PO Take 1 tablet by mouth daily.    . prasugrel (EFFIENT) 10 MG TABS tablet Take 1 tablet (10 mg total) by mouth daily. 30 tablet 11   No current facility-administered medications for this visit.  Allergies:   Latex; Penicillins; and Tekturna    Social History:  The patient  reports that she has never smoked. She has never used smokeless tobacco. She reports that she does not drink alcohol or use illicit drugs.   Family History:  The patient's family history includes Healthy in her brother and mother; Kidney disease in her father; Prostate cancer in her father. There is no history of Cancer, Diabetes, or Heart disease.    ROS:  Please see the history of present illness.   Otherwise, review of systems are positive for visual disturbance, wheezing, and  irregular heartbeat..   All other systems are reviewed and negative.    PHYSICAL EXAM: VS:  BP 138/82 mmHg  Pulse 57  Ht  (1.575 m)  Wt 227 lb (102.967 kg)  BMI 41.51 kg/m2  SpO2 98% , BMI Body mass index is 41.51 kg/(m^2). GEN: Well nourished, well developed, in no acute distress HEENT: normal Neck: no JVD, carotid bruits, or masses Cardiac: RRR.  There is no murmur, rub, or gallop. There is no edema. Respiratory:  clear to auscultation bilaterally, normal work of breathing. GI: soft, nontender, nondistended, + BS MS: no deformity or atrophy Skin: warm and dry, no rash Neuro:  Strength and sensation are intact Psych: euthymic mood, full affect   EKG:  EKG is no ordered today.    Recent Labs: 06/08/2015: B Natriuretic Peptide 34.1; Magnesium 2.0; TSH 1.351 07/23/2015: ALT 13; BUN 10; Creat 0.70; Hemoglobin CANCELED; Platelets CANCELED; Potassium 4.3; Sodium 134*    Lipid Panel    Component Value Date/Time   CHOL 120 07/16/2015 0733   TRIG 51 07/16/2015 0733   HDL 32* 07/16/2015 0733   CHOLHDL 3.8 07/16/2015 0733   VLDL 10 07/16/2015 0733   LDLCALC 78 07/16/2015 0733      Wt Readings from Last 3 Encounters:  07/29/15 227 lb (102.967 kg)  07/23/15 227 lb (102.967 kg)  07/17/15 224 lb 13.9 oz (102 kg)      Other studies Reviewed: Additional studies/ records that were reviewed today include: Review of medication list and hospital stay.. The findings include no recent additions to blood pressure therapy including Cozaar and amlodipine..    ASSESSMENT AND PLAN:  1. Coronary artery disease involving native coronary artery of native heart without angina pectoris Stable without angina. Dyspnea believe is related to Brilinta  2. Chronic diastolic heart failure (HCC) Dyspnea, I believe is related to Brilinta. Volume overload and CHF could explain this. If dyspnea does not improve with switching to Effient, we will need to consider diuretic therapy.  3.  Essential hypertension Very well controlled after the addition of amlodipine and Cozaar by Dr. Dossie Arbour  4. Hyperlipidemia On atorvastatin 80 mg per day. She is concerned about the dose and wonders if it is also contributing to side effects.    Current medicines are reviewed at length with the patient today.  The patient has the following concerns regarding medicines: Brilinta and atorvastatin.  The following changes/actions have been instituted:    Discontinue Brilinta and start Effient 10 mg daily. Overlapped tonight with the last dose of Brilinta and the first 10 mg Effient tablet.  Call in 3-4 days with a report on dyspnea and whether it is improving.  If symptoms don't improve we will then consider additional medication adjustments, such as decreasing or stopping atorvastatin.  May return to work  Labs/ tests ordered today include:  No orders of the defined types were placed in this  encounter.     Disposition:   FU with HS in 3 months  Signed, Lesleigh Noe, MD  07/29/2015 6:10 PM    [facility] [address] [place], [place], [place]  [address] Phone: [phone]; Fax: [phone]

## 2015-07-31 NOTE — Telephone Encounter (Signed)
Prior auth for Effient 10mg sent to [employer]. 

## 2015-08-01 ENCOUNTER — Telehealth: Payer: Self-pay

## 2015-08-01 NOTE — Telephone Encounter (Signed)
Effient  10mg  approved by [facility], through 10/30/2015.

## 2015-08-04 ENCOUNTER — Telehealth: Payer: Self-pay

## 2015-08-04 NOTE — Telephone Encounter (Signed)
Effient 10mg  approved through 10/30/2015.

## 2015-08-06 ENCOUNTER — Ambulatory Visit (INDEPENDENT_AMBULATORY_CARE_PROVIDER_SITE_OTHER): Payer: Commercial Managed Care - HMO | Admitting: Family Medicine

## 2015-08-06 ENCOUNTER — Encounter: Payer: Self-pay | Admitting: Family Medicine

## 2015-08-06 VITALS — BP 136/82 | HR 69 | Temp 98.6°F | Ht 63.0 in | Wt 227.0 lb

## 2015-08-06 DIAGNOSIS — I2510 Atherosclerotic heart disease of native coronary artery without angina pectoris: Secondary | ICD-10-CM | POA: Diagnosis not present

## 2015-08-06 DIAGNOSIS — R7309 Other abnormal glucose: Secondary | ICD-10-CM | POA: Diagnosis not present

## 2015-08-06 DIAGNOSIS — E785 Hyperlipidemia, unspecified: Secondary | ICD-10-CM

## 2015-08-06 DIAGNOSIS — I10 Essential (primary) hypertension: Secondary | ICD-10-CM | POA: Diagnosis not present

## 2015-08-06 LAB — LP+ALT+AST PICCOLO, WAIVED
ALT (SGPT) Piccolo, Waived: 23 U/L (ref 10–47)
AST (SGOT) Piccolo, Waived: 23 U/L (ref 11–38)
Chol/HDL Ratio Piccolo,Waive: 3.4 mg/dL
Cholesterol Piccolo, Waived: 123 mg/dL (ref <200)
HDL Chol Piccolo, Waived: 36 mg/dL — ABNORMAL LOW (ref >59)
LDL Chol Calc Piccolo Waived: 66 mg/dL (ref <100)
Triglycerides Piccolo,Waived: 103 mg/dL (ref <150)
VLDL Chol Calc Piccolo,Waive: 21 mg/dL (ref <30)

## 2015-08-06 MED ORDER — CLOPIDOGREL BISULFATE 75 MG PO TABS: 75.0000 mg | ORAL_TABLET | Freq: Every day | ORAL | Status: DC

## 2015-08-06 NOTE — Progress Notes (Signed)
BP 136/82 mmHg  Pulse 69  Temp(Src) 98.6 F (37 C)  Ht 5\' 3"  (1.6 m)  Wt 227 lb (102.967 kg)  BMI 40.22 kg/m2  SpO2 99%   Subjective:    Patient ID: Jennifer Grant, female    DOB: 01/29/1951, 64 y.o.   MRN: [id number]  HPI: Jennifer Grant is a 64 y.o. female  Chief Complaint  Patient presents with  . Hyperlipidemia  . Hypertension  . Coronary Artery Disease  . Shortness of Breath   Patient's primary concern is shortness of breath has figured that Marden NobleBrilinta is doing this. We will stop medication and start others Patient taking her cholesterol medicine with no complaints and doing well No chest pain chest tightness Taking her blood pressure medicines well with no complaints or problems and good blood pressure control.  Relevant past medical, surgical, family and social history reviewed and updated as indicated. Interim medical history since our last visit reviewed. Allergies and medications reviewed and updated.  Review of Systems  Constitutional: Negative.   Respiratory: Negative.   Cardiovascular: Negative.     Per HPI unless specifically indicated above     Objective:    BP 136/82 mmHg  Pulse 69  Temp(Src) 98.6 F (37 C)  Ht 5\' 3"  (1.6 m)  Wt 227 lb (102.967 kg)  BMI 40.22 kg/m2  SpO2 99%  Wt Readings from Last 3 Encounters:  08/06/15 227 lb (102.967 kg)  07/29/15 227 lb (102.967 kg)  07/23/15 227 lb (102.967 kg)    Physical Exam  Cardiovascular: Normal rate, regular rhythm and normal heart sounds.   Pulmonary/Chest: Breath sounds normal.    Results for orders placed or performed in visit on 07/23/15  Basic metabolic panel  Result Value Ref Range   Sodium 134 (L) 135 - 146 mmol/L   Potassium 4.3 3.5 - 5.3 mmol/L   Chloride 101 98 - 110 mmol/L   CO2 23 20 - 31 mmol/L   Glucose, Bld 69 65 - 99 mg/dL   BUN 10 7 - 25 mg/dL   Creat 1.910.70 4.780.50 - 2.950.99 mg/dL   Calcium 9.9 8.6 - 62.110.4 mg/dL  CBC with Differential/Platelet  Result Value Ref Range   WBC CANCELED 4.0 - 10.5 K/uL   RBC CANCELED 3.87 - 5.11 MIL/uL   Hemoglobin CANCELED 12.0 - 15.0 g/dL   HCT CANCELED 30.836.0 - 65.746.0 %   MCV CANCELED 78.0 - 100.0 fL   MCH CANCELED 26.0 - 34.0 pg   MCHC CANCELED 30.0 - 36.0 g/dL   RDW CANCELED 84.611.5 - 96.215.5 %   Platelets CANCELED 150 - 400 K/uL   MPV CANCELED 8.6 - 12.4 fL   Neutrophils Relative % CANCELED 43 - 77 %   Neutro Abs CANCELED 1.7 - 7.7 K/uL   Lymphocytes Relative CANCELED 12 - 46 %   Lymphs Abs CANCELED 0.7 - 4.0 K/uL   Monocytes Relative CANCELED 3 - 12 %   Monocytes Absolute CANCELED 0.1 - 1.0 K/uL   Eosinophils Relative CANCELED 0 - 5 %   Eosinophils Absolute CANCELED 0.0 - 0.7 K/uL   Basophils Relative CANCELED 0 - 1 %   Basophils Absolute CANCELED 0.0 - 0.1 K/uL   Smear Review CANCELED   Protime-INR  Result Value Ref Range   Prothrombin Time 13.6 11.6 - 15.2 seconds   INR 1.03 <1.50  Hepatic function panel  Result Value Ref Range   Total Bilirubin 1.2 0.2 - 1.2 mg/dL   Bilirubin, Direct 0.2 <=0.2  mg/dL   Indirect Bilirubin 1.0 0.2 - 1.2 mg/dL   Alkaline Phosphatase 76 33 - 130 U/L   AST 23 10 - 35 U/L   ALT 13 6 - 29 U/L   Total Protein 8.0 6.1 - 8.1 g/dL   Albumin 3.9 3.6 - 5.1 g/dL      Assessment & Plan:   Problem List Items Addressed This Visit      Cardiovascular and Mediastinum   Essential hypertension    The current medical regimen is effective;  continue present plan and medications.       Coronary artery disease involving native heart (Chronic)    Side effects to Brilinta Will stop and begin Plavix patient education given on why she needs to take medication such as Plavix        Other   Hyperlipidemia    The current medical regimen is effective;  continue present plan and medications. Finally patient's cholesterol is doing well and patient taking medications without side effects       Other Visit Diagnoses    Essential hypertension, benign    -  Primary    Relevant Orders     LP+ALT+AST Piccolo, Waived    Basic metabolic panel    Hyperlipemia        Relevant Orders    LP+ALT+AST Piccolo, Waived    Basic metabolic panel        Follow up plan: Return in about 3 months (around 11/04/2015) for Recheck medications with BMP, lipid panel, ALT, AST.

## 2015-08-06 NOTE — Assessment & Plan Note (Signed)
The current medical regimen is effective;  continue present plan and medications.  

## 2015-08-06 NOTE — Assessment & Plan Note (Signed)
The current medical regimen is effective;  continue present plan and medications. Finally patient's cholesterol is doing well and patient taking medications without side effects

## 2015-08-06 NOTE — Assessment & Plan Note (Signed)
Side effects to Brilinta Will stop and begin Plavix patient education given on why she needs to take medication such as Plavix

## 2015-08-07 ENCOUNTER — Encounter (HOSPITAL_COMMUNITY): Payer: Self-pay | Admitting: *Deleted

## 2015-08-07 LAB — BASIC METABOLIC PANEL
BUN / CREAT RATIO: 17 (ref 11–26)
BUN: 11 mg/dL (ref 8–27)
CO2: 22 mmol/L (ref 18–29)
Calcium: 9.6 mg/dL (ref 8.7–10.3)
Chloride: 102 mmol/L (ref 97–106)
Creatinine, Ser: 0.65 mg/dL (ref 0.57–1.00)
GFR, EST AFRICAN AMERICAN: 107 mL/min/{1.73_m2} (ref >59)
GFR, EST NON AFRICAN AMERICAN: 93 mL/min/{1.73_m2} (ref >59)
Glucose: 129 mg/dL — ABNORMAL HIGH (ref 65–99)
POTASSIUM: 3.6 mmol/L (ref 3.5–5.2)
SODIUM: 139 mmol/L (ref 136–144)

## 2015-08-11 NOTE — Addendum Note (Signed)
Addended byVonita Moss: Unita Detamore on: 08/11/2015 05:18 PM   Modules accepted: Orders

## 2015-08-14 ENCOUNTER — Telehealth: Payer: Self-pay

## 2015-08-14 NOTE — Telephone Encounter (Signed)
Received a PA for Brilinta, however patient was switched to Effient. Prior auth already obtained for Effient. Pharmacist aware.

## 2015-08-20 DIAGNOSIS — H04123 Dry eye syndrome of bilateral lacrimal glands: Secondary | ICD-10-CM | POA: Diagnosis not present

## 2015-08-20 DIAGNOSIS — H5213 Myopia, bilateral: Secondary | ICD-10-CM | POA: Diagnosis not present

## 2015-09-10 ENCOUNTER — Telehealth: Payer: Self-pay | Admitting: Interventional Cardiology

## 2015-09-10 NOTE — Telephone Encounter (Signed)
New message  ° ° °Patient calling for test results.   °

## 2015-09-10 NOTE — Telephone Encounter (Signed)
Pt aware of lab results from 07/23/15, labs were normal. Pt verbalized understanding. Pt called the office after receiving a letter for her to contact us.

## 2015-11-04 ENCOUNTER — Ambulatory Visit (INDEPENDENT_AMBULATORY_CARE_PROVIDER_SITE_OTHER): Payer: Commercial Managed Care - HMO | Admitting: Family Medicine

## 2015-11-04 VITALS — BP 127/78 | HR 81 | Temp 97.9°F | Ht 61.7 in | Wt 229.0 lb

## 2015-11-04 DIAGNOSIS — E785 Hyperlipidemia, unspecified: Secondary | ICD-10-CM | POA: Diagnosis not present

## 2015-11-04 DIAGNOSIS — Z113 Encounter for screening for infections with a predominantly sexual mode of transmission: Secondary | ICD-10-CM | POA: Diagnosis not present

## 2015-11-04 DIAGNOSIS — I10 Essential (primary) hypertension: Secondary | ICD-10-CM

## 2015-11-04 DIAGNOSIS — I2510 Atherosclerotic heart disease of native coronary artery without angina pectoris: Secondary | ICD-10-CM

## 2015-11-04 DIAGNOSIS — R7309 Other abnormal glucose: Secondary | ICD-10-CM | POA: Diagnosis not present

## 2015-11-04 LAB — LP+ALT+AST PICCOLO, WAIVED
ALT (SGPT) Piccolo, Waived: 24 U/L (ref 10–47)
AST (SGOT) Piccolo, Waived: 24 U/L (ref 11–38)
CHOLESTEROL PICCOLO, WAIVED: 157 mg/dL (ref <200)
Chol/HDL Ratio Piccolo,Waive: 3.6 mg/dL
HDL Chol Piccolo, Waived: 43 mg/dL — ABNORMAL LOW (ref >59)
LDL CHOL CALC PICCOLO WAIVED: 99 mg/dL (ref <100)
Triglycerides Piccolo,Waived: 77 mg/dL (ref <150)
VLDL Chol Calc Piccolo,Waive: 15 mg/dL (ref <30)

## 2015-11-04 LAB — BAYER DCA HB A1C WAIVED: HB A1C: 6.1 % (ref <7.0)

## 2015-11-04 NOTE — Progress Notes (Signed)
BP 127/78 mmHg  Pulse 81  Temp(Src) 97.9 F (36.6 C)  Ht 5' 1.7" (1.567 m)  Wt 229 lb (103.874 kg)  BMI 42.30 kg/m2  SpO2 99%   Subjective:    Patient ID: Jennifer Grant, female    DOB: [birth year], 65 y.o.   MRN: [id number]  HPI: Jennifer Grant is a 65 y.o. female  Chief Complaint  Patient presents with  . Hypertension  . Hyperlipidemia  . Hyperglycemia   Patient follow-up doing well no chest pain chest tightness Breathing is doing well Blood pressure well controlled for the first time Patient taking medications without side effects Patient with some elevated blood sugar readings because she was eating Honey buns which she has stopped A1c being done today  Relevant past medical, surgical, family and social history reviewed and updated as indicated. Interim medical history since our last visit reviewed. Allergies and medications reviewed and updated.  Review of Systems  Constitutional: Negative.   Respiratory: Negative.   Cardiovascular: Negative.     Per HPI unless specifically indicated above     Objective:    BP 127/78 mmHg  Pulse 81  Temp(Src) 97.9 F (36.6 C)  Ht 5' 1.7" (1.567 m)  Wt 229 lb (103.874 kg)  BMI 42.30 kg/m2  SpO2 99%  Wt Readings from Last 3 Encounters:  11/04/15 229 lb (103.874 kg)  08/06/15 227 lb (102.967 kg)  07/29/15 227 lb (102.967 kg)    Physical Exam  Constitutional: She is oriented to person, place, and time. She appears well-developed and well-nourished. No distress.  HENT:  Head: Normocephalic and atraumatic.  Right Ear: Hearing normal.  Left Ear: Hearing normal.  Nose: Nose normal.  Eyes: Conjunctivae and lids are normal. Right eye exhibits no discharge. Left eye exhibits no discharge. No scleral icterus.  Cardiovascular: Normal rate, regular rhythm and normal heart sounds.   Pulmonary/Chest: Effort normal and breath sounds normal. No respiratory distress.  Musculoskeletal: Normal range of motion.  Neurological: She  is alert and oriented to person, place, and time.  Skin: Skin is intact. No rash noted.  Psychiatric: She has a normal mood and affect. Her speech is normal and behavior is normal. Judgment and thought content normal. Cognition and memory are normal.    Results for orders placed or performed in visit on 08/06/15  LP+ALT+AST Piccolo, [employer]  Result Value Ref Range   ALT (SGPT) Piccolo, Waived 23 10 - 47 U/L   AST (SGOT) Piccolo, Waived 23 11 - 38 U/L   Cholesterol Piccolo, Waived 123 <200 mg/dL   HDL Chol Piccolo, Waived 36 (L) >59 mg/dL   Triglycerides Piccolo,Waived 103 <150 mg/dL   Chol/HDL Ratio Piccolo,Waive 3.4 mg/dL   LDL Chol Calc Piccolo Waived 66 <100 mg/dL   VLDL Chol Calc Piccolo,Waive 21 <30 mg/dL  Basic metabolic panel  Result Value Ref Range   Glucose 129 (H) 65 - 99 mg/dL   BUN 11 8 - 27 mg/dL   Creatinine, Ser 4.09 0.57 - 1.00 mg/dL   GFR calc non Af Amer 93 >59 mL/min/1.73   GFR calc Af Amer 107 >59 mL/min/1.73   BUN/Creatinine Ratio 17 11 - 26   Sodium 139 136 - 144 mmol/L   Potassium 3.6 3.5 - 5.2 mmol/L   Chloride 102 97 - 106 mmol/L   CO2 22 18 - 29 mmol/L   Calcium 9.6 8.7 - 10.3 mg/dL      Assessment & Plan:   Problem List Items Addressed This Visit  Cardiovascular and Mediastinum   Essential hypertension - Primary     doing well with good control of blood hypertension for the first time in years and years patient taking medications without side effects      Relevant Orders   LP+ALT+AST Piccolo, Waived   Coronary artery disease involving native heart (Chronic)    Doing well no chest pain or chest symptoms continue current medications        Other   Hyperlipidemia    Patient's cholesterol LDL is improved is down to 99 but still too high with goal of less than 70 for LDL she will continue working on diet exercise nutrition if unsuccessful next visit will consider other medications.      Relevant Orders   LP+ALT+AST Piccolo, Waived   Basic  metabolic panel    Other Visit Diagnoses    Elevated glucose level        Lab returned glucose slightly elevated will recheck hemoglobin A1c next office visit A1c still less than diabetic diagnoses discuss prediabetes and lifestyle    Routine screening for STI (sexually transmitted infection)        Relevant Orders    Hepatitis C Antibody        Follow up plan: Return for Physical Exam include A1c for prediabetes.

## 2015-11-04 NOTE — Assessment & Plan Note (Signed)
doing well with good control of blood hypertension for the first time in years and years patient taking medications without side effects

## 2015-11-04 NOTE — Assessment & Plan Note (Signed)
Doing well no chest pain or chest symptoms continue current medications

## 2015-11-04 NOTE — Assessment & Plan Note (Signed)
Patient's cholesterol LDL is improved is down to 99 but still too high with goal of less than 70 for LDL she will continue working on diet exercise nutrition if unsuccessful next visit will consider other medications.

## 2015-11-05 ENCOUNTER — Encounter: Payer: Self-pay | Admitting: Family Medicine

## 2015-11-05 LAB — BASIC METABOLIC PANEL
BUN / CREAT RATIO: 11 (ref 11–26)
BUN: 8 mg/dL (ref 8–27)
CHLORIDE: 98 mmol/L (ref 96–106)
CO2: 24 mmol/L (ref 18–29)
CREATININE: 0.72 mg/dL (ref 0.57–1.00)
Calcium: 10 mg/dL (ref 8.7–10.3)
GFR calc Af Amer: 100 mL/min/{1.73_m2} (ref >59)
GFR calc non Af Amer: 87 mL/min/{1.73_m2} (ref >59)
GLUCOSE: 84 mg/dL (ref 65–99)
Potassium: 4.2 mmol/L (ref 3.5–5.2)
SODIUM: 135 mmol/L (ref 134–144)

## 2015-11-05 LAB — HEPATITIS C ANTIBODY

## 2015-11-06 ENCOUNTER — Ambulatory Visit (INDEPENDENT_AMBULATORY_CARE_PROVIDER_SITE_OTHER): Payer: Commercial Managed Care - HMO | Admitting: Interventional Cardiology

## 2015-11-06 ENCOUNTER — Encounter: Payer: Self-pay | Admitting: Interventional Cardiology

## 2015-11-06 VITALS — BP 120/72 | HR 53 | Ht 63.0 in | Wt 229.0 lb

## 2015-11-06 DIAGNOSIS — I2510 Atherosclerotic heart disease of native coronary artery without angina pectoris: Secondary | ICD-10-CM

## 2015-11-06 DIAGNOSIS — I5032 Chronic diastolic (congestive) heart failure: Secondary | ICD-10-CM | POA: Diagnosis not present

## 2015-11-06 DIAGNOSIS — E785 Hyperlipidemia, unspecified: Secondary | ICD-10-CM

## 2015-11-06 DIAGNOSIS — I10 Essential (primary) hypertension: Secondary | ICD-10-CM

## 2015-11-06 NOTE — Progress Notes (Signed)
Cardiology Office Note   Date:  11/06/2015   ID:  Jennifer Grant, DOB Sep 27, 1950, MRN [id number]  PCP:  Vonita Moss, MD  Cardiologist:  Lesleigh Noe, MD   Chief Complaint  Patient presents with  . Coronary Artery Disease      History of Present Illness: Jennifer Grant is a 65 y.o. female who presents for Follow-up of coronary artery disease and recent acute infarction due to stent thrombosis. She has history of hypertension, hyperlipidemia, mitral valve prolapse.  She has a cough that she feels is related to Lipitor. Otherwise she is doing well. She denies angina. She has not had syncope or orthopnea. She has not ED use nitroglycerin. She denies lower extremity swelling.    Past Medical History  Diagnosis Date  . Hyperlipidemia   . Mitral valve disorder   . Menopausal state   . Anginal pain (HCC)   . Myocardial infarction [facility]) 2004; 06/06/2015  . GERD (gastroesophageal reflux disease)   . 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     "got booster shot a few times"    Past Surgical History  Procedure Laterality Date  . Knee arthroscopy Left 1990's  . Coronary angioplasty with stent placement      "6 stents before 05/2015"  . Cardiac catheterization N/A 06/08/2015    Procedure: Left Heart Cath and Coronary Angiography;  Surgeon: Lyn Records, MD;  Location: [facility] INVASIVE CV LAB;  Service: Cardiovascular;  Laterality: N/A;  . Cardiac catheterization N/A 06/08/2015    Procedure: Coronary Stent Intervention;  Surgeon: Lyn Records, MD;  Location: [facility] INVASIVE CV LAB;  Service: Cardiovascular;  Laterality: N/A;  . Cardiac catheterization N/A 07/16/2015    Procedure: Coronary Stent Intervention;  Surgeon: Lyn Records, MD;  Location: [facility] INVASIVE CV LAB;  Service: Cardiovascular;  Laterality: N/A;  . Tubal ligation  ~ 1978     Current Outpatient Prescriptions  Medication Sig Dispense Refill  . amLODipine (NORVASC) 10 MG tablet Take 1 tablet (10 mg total) by mouth daily. 90 tablet 3  .  aspirin 81 MG chewable tablet Chew 1 tablet (81 mg total) by mouth daily.    Marland Kitchen atorvastatin (LIPITOR) 80 MG tablet Take 1 tablet (80 mg total) by mouth daily at 6 PM. 30 tablet 11  . clopidogrel (PLAVIX) 75 MG tablet Take 1 tablet (75 mg total) by mouth daily. 90 tablet 3  . isosorbide mononitrate (IMDUR) 60 MG 24 hr tablet Take 1 tablet (60 mg total) by mouth daily. 30 tablet 11  . losartan (COZAAR) 100 MG tablet Take 1 tablet (100 mg total) by mouth daily. 90 tablet 1  . metoprolol tartrate (LOPRESSOR) 50 MG tablet Take 1 tablet (50 mg total) by mouth 2 (two) times daily. 60 tablet 11  . Multiple Vitamin (MULTIVITAMIN WITH MINERALS) TABS tablet Take 1 tablet by mouth daily.    . nitroGLYCERIN (NITROSTAT) 0.4 MG SL tablet Place 1 tablet (0.4 mg total) under the tongue every 5 (five) minutes x 3 doses as needed for chest pain. 25 tablet 3  . potassium chloride SA (K-DUR,KLOR-CON) 20 MEQ tablet Take 1 tablet (20 mEq total) by mouth daily. 30 tablet 3  . RESVERATROL PO Take 1 tablet by mouth daily.     No current facility-administered medications for this visit.    Allergies:   Brilinta; Latex; Penicillins; and Tekturna    Social History:  The patient  reports that she has never smoked. She has never used smokeless tobacco. She  reports that she does not drink alcohol or use illicit drugs.   Family History:  The patient's family history includes Healthy in her brother and mother; Kidney disease in her father; Prostate cancer in her father. There is no history of Cancer, Diabetes, or Heart disease.    ROS:  Please see the history of present illness.   Otherwise, review of systems are positive for Cough, trace ankle edema late in the afternoon..   All other systems are reviewed and negative.    PHYSICAL EXAM: VS:  BP 120/72 mmHg  Pulse 53  Ht 5\' 3"  (1.6 m)  Wt 229 lb (103.874 kg)  BMI 40.58 kg/m2 , BMI Body mass index is 40.58 kg/(m^2). GEN: Well nourished, well developed, in no acute  distress HEENT: normal Neck: no JVD, carotid bruits, or masses Cardiac: RRR.  There is no murmur, rub, or gallop. There is no edema. Respiratory:  clear to auscultation bilaterally, normal work of breathing. GI: soft, nontender, nondistended, + BS MS: no deformity or atrophy Skin: warm and dry, no rash Neuro:  Strength and sensation are intact Psych: euthymic mood, full affect   EKG:  EKG is not ordered today.    Recent Labs: 06/08/2015: B Natriuretic Peptide 34.1; Magnesium 2.0; TSH 1.351 07/23/2015: ALT 13; Hemoglobin CANCELED; Platelets CANCELED 11/04/2015: BUN 8; Creatinine, Ser 0.72; Potassium 4.2; Sodium 135    Lipid Panel    Component Value Date/Time   CHOL 120 07/16/2015 0733   TRIG 51 07/16/2015 0733   HDL 32* 07/16/2015 0733   CHOLHDL 3.8 07/16/2015 0733   VLDL 10 07/16/2015 0733   LDLCALC 78 07/16/2015 0733      Wt Readings from Last 3 Encounters:  11/06/15 229 lb (103.874 kg)  11/04/15 229 lb (103.874 kg)  08/06/15 227 lb (102.967 kg)      Other studies Reviewed: Additional studies/ records that were reviewed today include: Most recent lipid panel. The findings include LDL 99.    ASSESSMENT AND PLAN:  1. Coronary artery disease involving native coronary artery of native heart without angina pectoris Asymptomatic  2. Chronic diastolic heart failure (HCC) No evidence of volume overload  3. Essential hypertension Well controlled  4. Hyperlipidemia LDL 99 on 80 mg of Lipitor. She is concerned about cough but it does not seem to be debilitating. She is willing to continue the medication.  5. Cough Uncertain cause. I am not certain this as a thing to do with Lipitor.  Current medicines are reviewed at length with the patient today.  The patient has the following concerns regarding medicines: Lipitor is concerning her by possibly causing a cough..  The following changes/actions have been instituted:    Continue the current medical regimen.  When  she follows up in 6 months would may consider discontinuation of Plavix.  If cough becomes an issue we may temporarily interrupt Lipitor therapy for 2 weeks and then restarted. This will help to determine if there is a correlation between the cough and the medication.  Labs/ tests ordered today include:  No orders of the defined types were placed in this encounter.     Disposition:   FU with HS in 6 months  Signed, Lesleigh NoeSMITH III,Acire Tang W, MD  11/06/2015 2:42 PM    [facility] [address], [place], [place]  [address] Phone: [phone]; Fax: [phone]

## 2015-11-06 NOTE — Patient Instructions (Signed)
Medication Instructions:  Your physician recommends that you continue on your current medications as directed. Please refer to the Current Medication list given to you today.   Labwork: None ordered.  Testing/Procedures: None ordered  Follow-Up: Your physician wants you to follow-up in 6 months. You will receive a reminder letter in the mail two months in advance. If you don't receive a letter, please call our office to schedule the follow-up appointment.   Any Other Special Instructions Will Be Listed Below (If Applicable).     If you need a refill on your cardiac medications before your next appointment, please call your pharmacy.   

## 2015-12-05 ENCOUNTER — Other Ambulatory Visit: Payer: Self-pay | Admitting: Physician Assistant

## 2015-12-05 NOTE — Telephone Encounter (Signed)
Please review for refill. Thanks!  

## 2016-02-17 IMAGING — CR DG CHEST 2V
2 series · 2 of 2 positions shown · non-contrast
Comparison: None in PACs

CLINICAL DATA: Acute MI, CHF both acute and chronic, nonsmoker.

EXAM:
CHEST  2 VIEW

[chest pa]
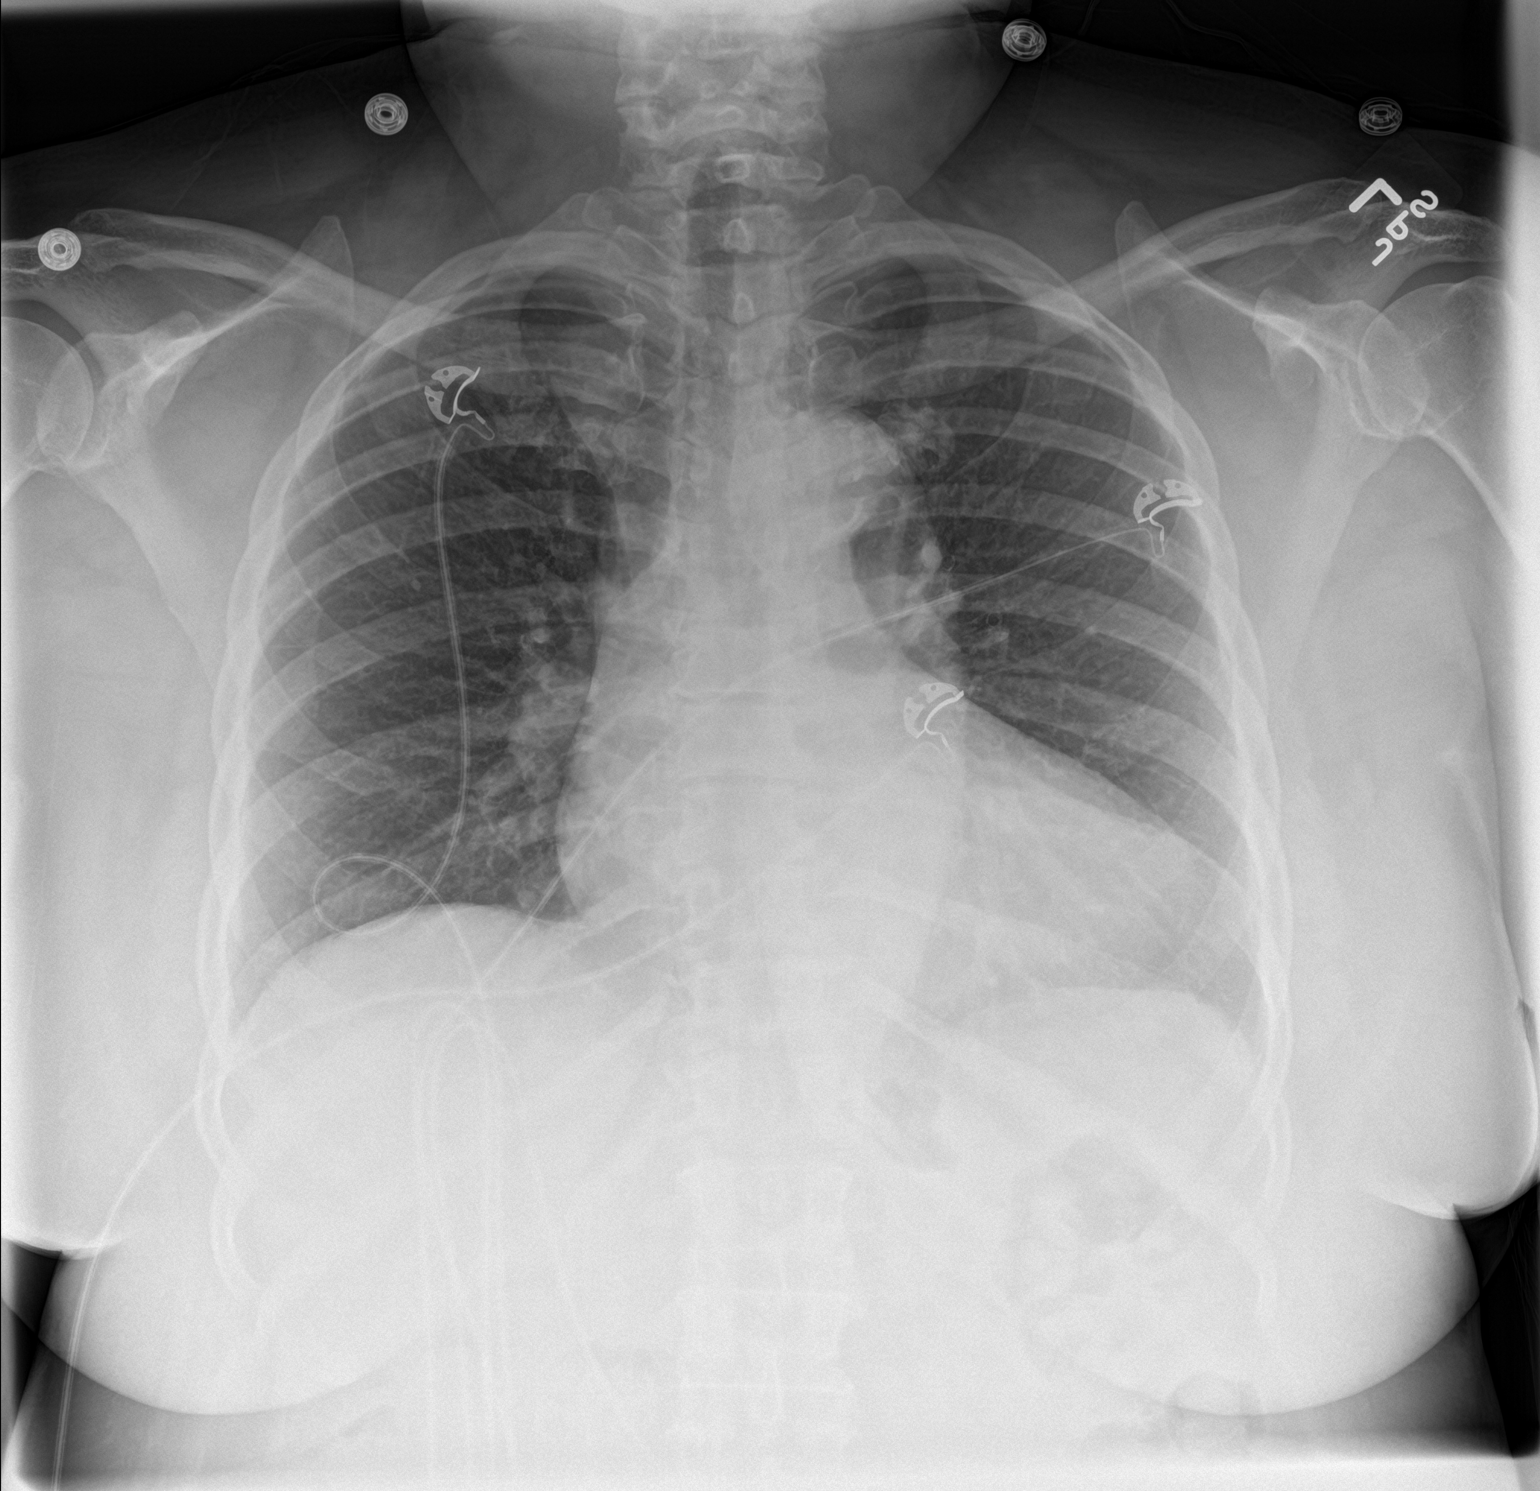

[chest lat]
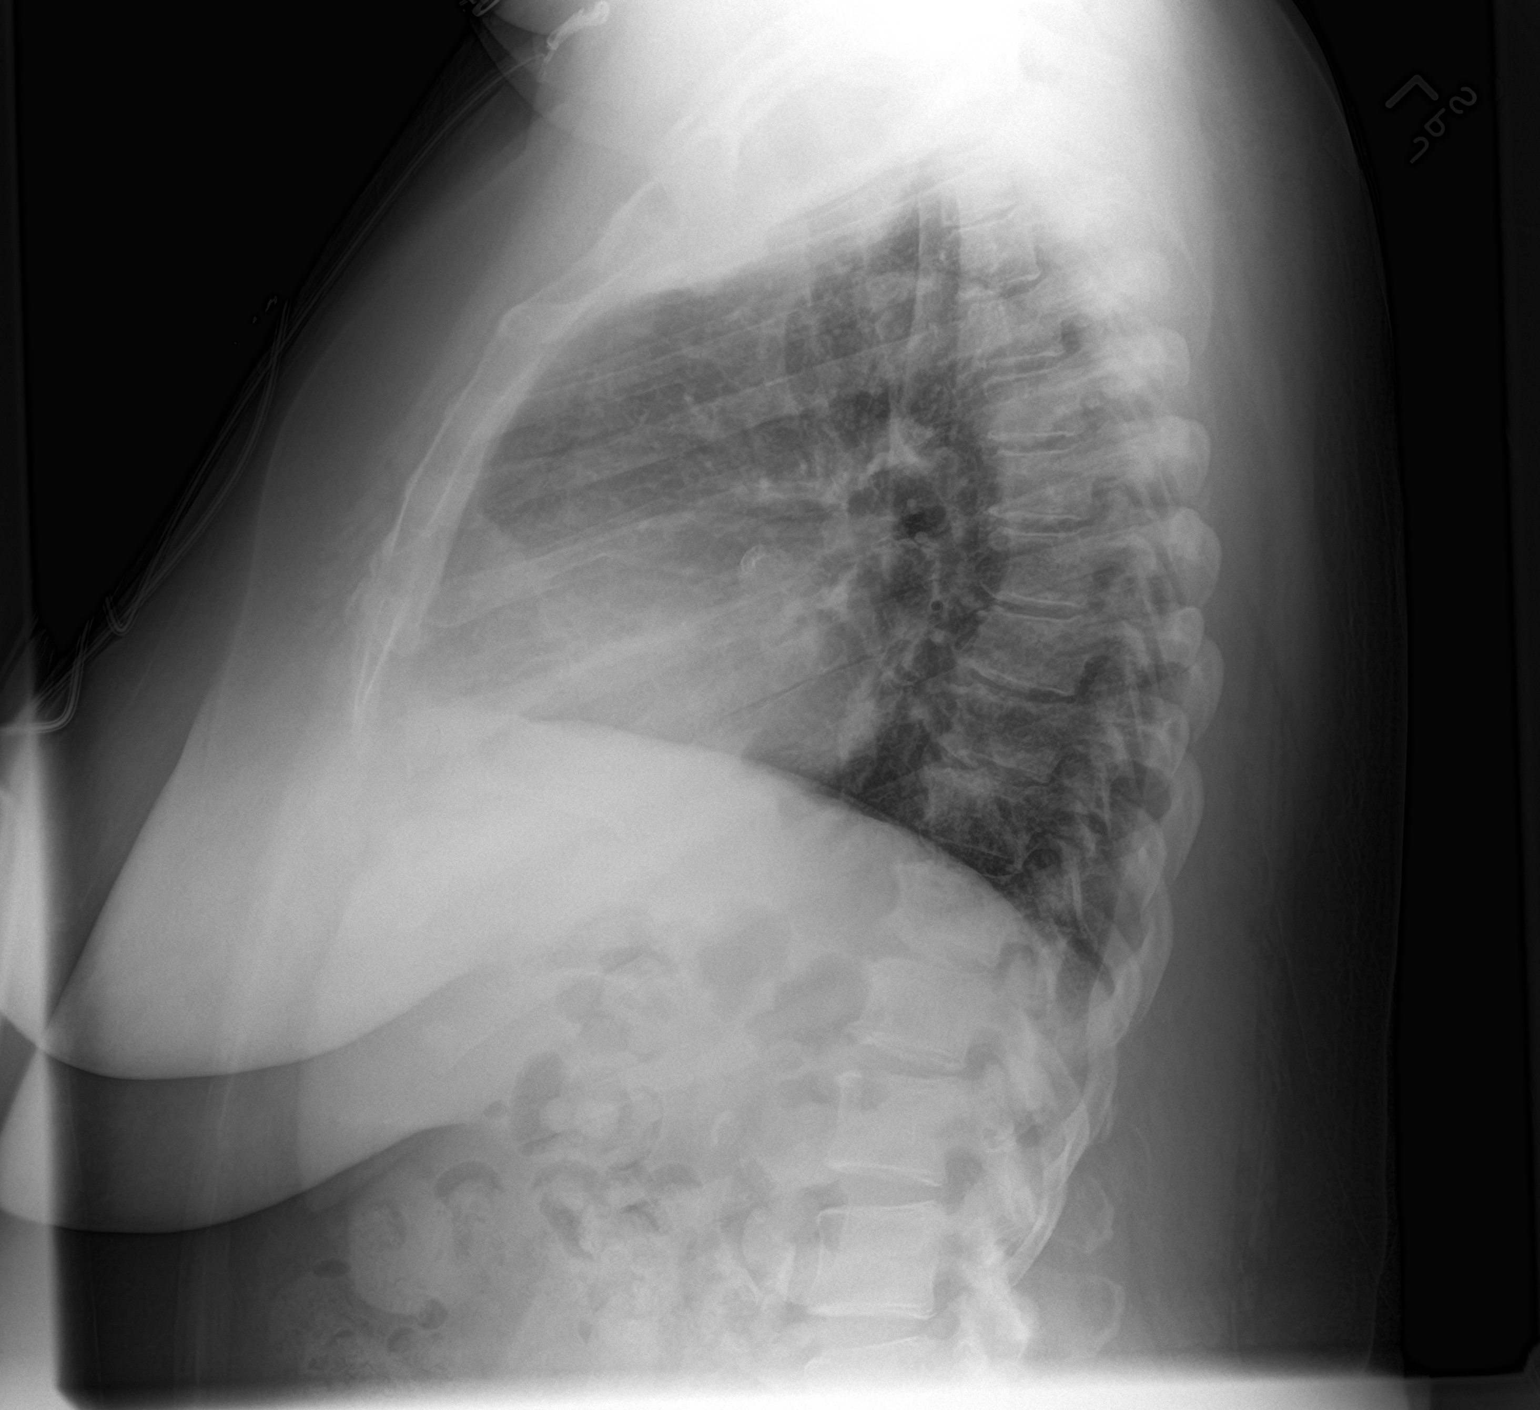

[2 of 2 positions shown; findings below may reference images not displayed]

FINDINGS: The lungs are borderline hypoinflated but clear. The cardiac
silhouette is enlarged. The pulmonary vascularity is not engorged.
The mediastinum is normal in width. There is mild tortuosity of the
descending thoracic aorta. The bony thorax exhibits no acute
abnormality.
IMPRESSION: Mild enlargement of the cardiac silhouette without evidence of
pulmonary edema. There is no pneumonia.

## 2016-03-10 ENCOUNTER — Other Ambulatory Visit: Payer: Self-pay | Admitting: Family Medicine

## 2016-03-18 ENCOUNTER — Telehealth: Payer: Self-pay | Admitting: Interventional Cardiology

## 2016-03-18 NOTE — Telephone Encounter (Signed)
New message     The pt is wanting to speak with the nurse about her medication would give the medication information to me the pt stated it is a nurses thing.

## 2016-03-18 NOTE — Telephone Encounter (Signed)
Attempted to call pt at the number given. lmom for the pt to call back if assistance is still needed.

## 2016-03-18 NOTE — Telephone Encounter (Signed)
MOBILE   NUMBER CALLED  PERSON ANSWERED AND THEN HUNG  UP  TRIED CALLING  AGAIN  AND  WAS  HUNG UP  ON WILL TRY LATER .Jennifer Grant/CY

## 2016-03-24 ENCOUNTER — Ambulatory Visit (INDEPENDENT_AMBULATORY_CARE_PROVIDER_SITE_OTHER): Payer: Commercial Managed Care - HMO | Admitting: Family Medicine

## 2016-03-24 ENCOUNTER — Encounter: Payer: Self-pay | Admitting: Family Medicine

## 2016-03-24 VITALS — BP 140/88 | HR 56 | Temp 98.1°F | Ht 62.8 in | Wt 229.0 lb

## 2016-03-24 DIAGNOSIS — I10 Essential (primary) hypertension: Secondary | ICD-10-CM

## 2016-03-24 DIAGNOSIS — E785 Hyperlipidemia, unspecified: Secondary | ICD-10-CM

## 2016-03-24 DIAGNOSIS — Z0000 Encounter for general adult medical examination without abnormal findings: Secondary | ICD-10-CM | POA: Diagnosis not present

## 2016-03-24 DIAGNOSIS — I2510 Atherosclerotic heart disease of native coronary artery without angina pectoris: Secondary | ICD-10-CM | POA: Diagnosis not present

## 2016-03-24 DIAGNOSIS — Z1231 Encounter for screening mammogram for malignant neoplasm of breast: Secondary | ICD-10-CM

## 2016-03-24 DIAGNOSIS — Z23 Encounter for immunization: Secondary | ICD-10-CM

## 2016-03-24 LAB — URINALYSIS, ROUTINE W REFLEX MICROSCOPIC
BILIRUBIN UA: NEGATIVE
Glucose, UA: NEGATIVE
KETONES UA: NEGATIVE
NITRITE UA: NEGATIVE
Protein, UA: NEGATIVE
Urobilinogen, Ur: 0.2 mg/dL (ref 0.2–1.0)
pH, UA: 5.5 (ref 5.0–7.5)

## 2016-03-24 LAB — MICROSCOPIC EXAMINATION

## 2016-03-24 MED ORDER — ATORVASTATIN CALCIUM 80 MG PO TABS: 80.0000 mg | ORAL_TABLET | Freq: Every day | ORAL | 4 refills | Status: DC

## 2016-03-24 MED ORDER — METOPROLOL TARTRATE 50 MG PO TABS
50.0000 mg | ORAL_TABLET | Freq: Two times a day (BID) | ORAL | 4 refills | Status: DC
Start: 2016-03-24 — End: 2017-03-21

## 2016-03-24 MED ORDER — LOSARTAN POTASSIUM 100 MG PO TABS: 100.0000 mg | ORAL_TABLET | Freq: Every day | ORAL | 4 refills | Status: DC

## 2016-03-24 MED ORDER — AMLODIPINE BESYLATE 10 MG PO TABS: 10.0000 mg | ORAL_TABLET | Freq: Every day | ORAL | 4 refills | Status: DC

## 2016-03-24 MED ORDER — CLOPIDOGREL BISULFATE 75 MG PO TABS: 75.0000 mg | ORAL_TABLET | Freq: Every day | ORAL | 4 refills | Status: DC

## 2016-03-24 NOTE — Assessment & Plan Note (Signed)
The current medical regimen is effective;  continue present plan and medications.  

## 2016-03-24 NOTE — Patient Instructions (Signed)
Pneumococcal Polysaccharide Vaccine: What You Need to Know 1. Why get vaccinated? Vaccination can protect older adults (and some children and younger adults) from pneumococcal disease. Pneumococcal disease is caused by bacteria that can spread from person to person through close contact. It can cause ear infections, and it can also lead to more serious infections of the:   Lungs (pneumonia),  Blood (bacteremia), and  Covering of the brain and spinal cord (meningitis). Meningitis can cause deafness and brain damage, and it can be fatal. Anyone can get pneumococcal disease, but children under 2 years of age, people with certain medical conditions, adults over 65 years of age, and cigarette smokers are at the highest risk. About 18,000 older adults die each year from pneumococcal disease in the [place]. Treatment of pneumococcal infections with penicillin and other drugs used to be more effective. But some strains of the disease have become resistant to these drugs. This makes prevention of the disease, through vaccination, even more important. 2. Pneumococcal polysaccharide vaccine (PPSV23) Pneumococcal polysaccharide vaccine (PPSV23) protects against 23 types of pneumococcal bacteria. It will not prevent all pneumococcal disease. PPSV23 is recommended for:  All adults 65 years of age and older,  Anyone 2 through 67 years of age with certain long-term health problems,  Anyone 2 through 67 years of age with a weakened immune system,  Adults 19 through 67 years of age who smoke cigarettes or have asthma. Most people need only one dose of PPSV. A second dose is recommended for certain high-risk groups. People 65 and older should get a dose even if they have gotten one or more doses of the vaccine before they turned 65. Your healthcare provider can give you more information about these recommendations. Most healthy adults develop protection within 2 to 3 weeks of getting the shot. 3. Some  people should not get this vaccine  Anyone who has had a life-threatening allergic reaction to PPSV should not get another dose.  Anyone who has a severe allergy to any component of PPSV should not receive it. Tell your provider if you have any severe allergies.  Anyone who is moderately or severely ill when the shot is scheduled may be asked to wait until they recover before getting the vaccine. Someone with a mild illness can usually be vaccinated.  Children less than 2 years of age should not receive this vaccine.  There is no evidence that PPSV is harmful to either a pregnant woman or to her fetus. However, as a precaution, women who need the vaccine should be vaccinated before becoming pregnant, if possible. 4. Risks of a vaccine reaction With any medicine, including vaccines, there is a chance of side effects. These are usually mild and go away on their own, but serious reactions are also possible. About half of people who get PPSV have mild side effects, such as redness or pain where the shot is given, which go away within about two days. Less than 1 out of 100 people develop a fever, muscle aches, or more severe local reactions. Problems that could happen after any vaccine:  People sometimes faint after a medical procedure, including vaccination. Sitting or lying down for about 15 minutes can help prevent fainting, and injuries caused by a fall. Tell your doctor if you feel dizzy, or have vision changes or ringing in the ears.  Some people get severe pain in the shoulder and have difficulty moving the arm where a shot was given. This happens very rarely.  Any medication   can cause a severe allergic reaction. Such reactions from a vaccine are very rare, estimated at about 1 in a million doses, and would happen within a few minutes to a few hours after the vaccination. As with any medicine, there is a very remote chance of a vaccine causing a serious injury or death. The safety of  vaccines is always being monitored. For more information, visit: www.cdc.gov/vaccinesafety/ 5. What if there is a serious reaction? What should I look for? Look for anything that concerns you, such as signs of a severe allergic reaction, very high fever, or unusual behavior.  Signs of a severe allergic reaction can include hives, swelling of the face and throat, difficulty breathing, a fast heartbeat, dizziness, and weakness. These would usually start a few minutes to a few hours after the vaccination. What should I do? If you think it is a severe allergic reaction or other emergency that can't wait, call 9-1-1 or get to the nearest hospital. Otherwise, call your doctor. Afterward, the reaction should be reported to the Vaccine Adverse Event Reporting System (VAERS). Your doctor might file this report, or you can do it yourself through the VAERS web site at www.vaers.hhs.gov, or by calling 1-800-822-7967.  VAERS does not give medical advice. 6. How can I learn more?  Ask your doctor. He or she can give you the vaccine package insert or suggest other sources of information.  Call your local or state health department.  Contact the Centers for Disease Control and Prevention (CDC):  Call 1-800-232-4636 (1-800-CDC-INFO) or  Visit CDC's website at www.cdc.gov/vaccines CDC Pneumococcal Polysaccharide Vaccine VIS (12/21/13)   This information is not intended to replace advice given to you by your health care provider. Make sure you discuss any questions you have with your health care provider.   Document Released: 06/13/2006 Document Revised: 09/06/2014 Document Reviewed: 12/24/2013 Elsevier Interactive Patient Education 2016 Elsevier Inc. Tdap Vaccine (Tetanus, Diphtheria and Pertussis): What You Need to Know 1. Why get vaccinated? Tetanus, diphtheria and pertussis are very serious diseases. Tdap vaccine can protect us from these diseases. And, Tdap vaccine given to pregnant women can protect  newborn babies against pertussis. TETANUS (Lockjaw) is rare in the [place] today. It causes painful muscle tightening and stiffness, usually all over the body.  It can lead to tightening of muscles in the head and neck so you can't open your mouth, swallow, or sometimes even breathe. Tetanus kills about 1 out of 10 people who are infected even after receiving the best medical care. DIPHTHERIA is also rare in the [place] today. It can cause a thick coating to form in the back of the throat.  It can lead to breathing problems, heart failure, paralysis, and death. PERTUSSIS (Whooping Cough) causes severe coughing spells, which can cause difficulty breathing, vomiting and disturbed sleep.  It can also lead to weight loss, incontinence, and rib fractures. Up to 2 in 100 adolescents and 5 in 100 adults with pertussis are hospitalized or have complications, which could include pneumonia or death. These diseases are caused by bacteria. Diphtheria and pertussis are spread from person to person through secretions from coughing or sneezing. Tetanus enters the body through cuts, scratches, or wounds. Before vaccines, as many as 200,000 cases of diphtheria, 200,000 cases of pertussis, and hundreds of cases of tetanus, were reported in the [place] each year. Since vaccination began, reports of cases for tetanus and diphtheria have dropped by about 99% and for pertussis by about 80%. 2. Tdap   vaccine Tdap vaccine can protect adolescents and adults from tetanus, diphtheria, and pertussis. One dose of Tdap is routinely given at age 11 or 12. People who did not get Tdap at that age should get it as soon as possible. Tdap is especially important for healthcare professionals and anyone having close contact with a baby younger than 12 months. Pregnant women should get a dose of Tdap during every pregnancy, to protect the newborn from pertussis. Infants are most at risk for severe, life-threatening  complications from pertussis. Another vaccine, called Td, protects against tetanus and diphtheria, but not pertussis. A Td booster should be given every 10 years. Tdap may be given as one of these boosters if you have never gotten Tdap before. Tdap may also be given after a severe cut or burn to prevent tetanus infection. Your doctor or the person giving you the vaccine can give you more information. Tdap may safely be given at the same time as other vaccines. 3. Some people should not get this vaccine  A person who has ever had a life-threatening allergic reaction after a previous dose of any diphtheria, tetanus or pertussis containing vaccine, OR has a severe allergy to any part of this vaccine, should not get Tdap vaccine. Tell the person giving the vaccine about any severe allergies.  Anyone who had coma or long repeated seizures within 7 days after a childhood dose of DTP or DTaP, or a previous dose of Tdap, should not get Tdap, unless a cause other than the vaccine was found. They can still get Td.  Talk to your doctor if you:  have seizures or another nervous system problem,  had severe pain or swelling after any vaccine containing diphtheria, tetanus or pertussis,  ever had a condition called Guillain-Barr Syndrome (GBS),  aren't feeling well on the day the shot is scheduled. 4. Risks With any medicine, including vaccines, there is a chance of side effects. These are usually mild and go away on their own. Serious reactions are also possible but are rare. Most people who get Tdap vaccine do not have any problems with it. Mild problems following Tdap (Did not interfere with activities)  Pain where the shot was given (about 3 in 4 adolescents or 2 in 3 adults)  Redness or swelling where the shot was given (about 1 person in 5)  Mild fever of at least 100.4F (up to about 1 in 25 adolescents or 1 in 100 adults)  Headache (about 3 or 4 people in 10)  Tiredness (about 1 person in  3 or 4)  Nausea, vomiting, diarrhea, stomach ache (up to 1 in 4 adolescents or 1 in 10 adults)  Chills, sore joints (about 1 person in 10)  Body aches (about 1 person in 3 or 4)  Rash, swollen glands (uncommon) Moderate problems following Tdap (Interfered with activities, but did not require medical attention)  Pain where the shot was given (up to 1 in 5 or 6)  Redness or swelling where the shot was given (up to about 1 in 16 adolescents or 1 in 12 adults)  Fever over 102F (about 1 in 100 adolescents or 1 in 250 adults)  Headache (about 1 in 7 adolescents or 1 in 10 adults)  Nausea, vomiting, diarrhea, stomach ache (up to 1 or 3 people in 100)  Swelling of the entire arm where the shot was given (up to about 1 in 500). Severe problems following Tdap (Unable to perform usual activities; required medical attention)  Swelling,   severe pain, bleeding and redness in the arm where the shot was given (rare). Problems that could happen after any vaccine:  People sometimes faint after a medical procedure, including vaccination. Sitting or lying down for about 15 minutes can help prevent fainting, and injuries caused by a fall. Tell your doctor if you feel dizzy, or have vision changes or ringing in the ears.  Some people get severe pain in the shoulder and have difficulty moving the arm where a shot was given. This happens very rarely.  Any medication can cause a severe allergic reaction. Such reactions from a vaccine are very rare, estimated at fewer than 1 in a million doses, and would happen within a few minutes to a few hours after the vaccination. As with any medicine, there is a very remote chance of a vaccine causing a serious injury or death. The safety of vaccines is always being monitored. For more information, visit: www.cdc.gov/vaccinesafety/ 5. What if there is a serious problem? What should I look for?  Look for anything that concerns you, such as signs of a severe  allergic reaction, very high fever, or unusual behavior.  Signs of a severe allergic reaction can include hives, swelling of the face and throat, difficulty breathing, a fast heartbeat, dizziness, and weakness. These would usually start a few minutes to a few hours after the vaccination. What should I do?  If you think it is a severe allergic reaction or other emergency that can't wait, call 9-1-1 or get the person to the nearest hospital. Otherwise, call your doctor.  Afterward, the reaction should be reported to the Vaccine Adverse Event Reporting System (VAERS). Your doctor might file this report, or you can do it yourself through the VAERS web site at www.vaers.hhs.gov, or by calling 1-800-822-7967. VAERS does not give medical advice.  6. The National Vaccine Injury Compensation Program The National Vaccine Injury Compensation Program (VICP) is a federal program that was created to compensate people who may have been injured by certain vaccines. Persons who believe they may have been injured by a vaccine can learn about the program and about filing a claim by calling 1-800-338-2382 or visiting the VICP website at www.hrsa.gov/vaccinecompensation. There is a time limit to file a claim for compensation. 7. How can I learn more?  Ask your doctor. He or she can give you the vaccine package insert or suggest other sources of information.  Call your local or state health department.  Contact the Centers for Disease Control and Prevention (CDC):  Call 1-800-232-4636 (1-800-CDC-INFO) or  Visit CDC's website at www.cdc.gov/vaccines CDC Tdap Vaccine VIS (10/23/13)   This information is not intended to replace advice given to you by your health care provider. Make sure you discuss any questions you have with your health care provider.   Document Released: 02/15/2012 Document Revised: 09/06/2014 Document Reviewed: 11/28/2013 Elsevier Interactive Patient Education 2016 Elsevier Inc.  

## 2016-03-24 NOTE — Progress Notes (Signed)
BP 140/88 (BP Location: Left Arm, Patient Position: Sitting, Cuff Size: Normal)   Pulse (!) 56   Temp 98.1 F (36.7 C)   Ht 5' 2.8" (1.595 m)   Wt 229 lb (103.9 kg)   LMP  (LMP Unknown)   SpO2 97%   BMI 40.82 kg/m    Subjective:    Patient ID: Jennifer Grant, female    DOB: 10-14-1950, 65 y.o.   MRN: [id number]  HPI: Jennifer Grant is a 65 y.o. female  Chief Complaint  Patient presents with  . Annual Exam   Patient concerned may have part of the Q-tip stuck in her right ear and feels stopped up at times Diabetes doing well no complaints Blood pressure taking blood pressure medicines without problems No chest pain chest tightness no shortness of breath taking cholesterol medicines without problems  Relevant past medical, surgical, family and social history reviewed and updated as indicated. Interim medical history since our last visit reviewed. Allergies and medications reviewed and updated.  Review of Systems  Constitutional: Negative.   HENT: Negative.   Eyes: Negative.   Respiratory: Negative.   Cardiovascular: Negative.   Gastrointestinal: Negative.   Endocrine: Negative.   Genitourinary: Negative.        Patient's GYN offered pessary  for vaginal prolapse but did not get and is not bothered by this problem anymore.  Musculoskeletal: Negative.   Skin: Negative.   Allergic/Immunologic: Negative.   Neurological: Negative.   Hematological: Negative.   Psychiatric/Behavioral: Negative.     Per HPI unless specifically indicated above     Objective:    BP 140/88 (BP Location: Left Arm, Patient Position: Sitting, Cuff Size: Normal)   Pulse (!) 56   Temp 98.1 F (36.7 C)   Ht 5' 2.8" (1.595 m)   Wt 229 lb (103.9 kg)   LMP  (LMP Unknown)   SpO2 97%   BMI 40.82 kg/m   Wt Readings from Last 3 Encounters:  03/24/16 229 lb (103.9 kg)  11/06/15 229 lb (103.9 kg)  11/04/15 229 lb (103.9 kg)    Physical Exam  Constitutional: She is oriented to person,  place, and time. She appears well-developed and well-nourished.  HENT:  Head: Normocephalic and atraumatic.  Right Ear: External ear normal.  Left Ear: External ear normal.  Nose: Nose normal.  Mouth/Throat: Oropharynx is clear and moist.  Eyes: Conjunctivae and EOM are normal. Pupils are equal, round, and reactive to light.  Neck: Normal range of motion. Neck supple. Carotid bruit is not present.  Cardiovascular: Normal rate, regular rhythm and normal heart sounds.   No murmur heard. Pulmonary/Chest: Effort normal and breath sounds normal. She exhibits no mass. Right breast exhibits no mass, no skin change and no tenderness. Left breast exhibits no mass, no skin change and no tenderness. Breasts are symmetrical.  Abdominal: Soft. Bowel sounds are normal. There is no hepatosplenomegaly.  Musculoskeletal: Normal range of motion.  Neurological: She is alert and oriented to person, place, and time.  Skin: No rash noted.  Psychiatric: She has a normal mood and affect. Her behavior is normal. Judgment and thought content normal.    Results for orders placed or performed in visit on 11/04/15  Bayer DCA Hb A1c Waived  Result Value Ref Range   Bayer DCA Hb A1c Waived 6.1 <7.0 %  LP+ALT+AST Piccolo, Waived  Result Value Ref Range   ALT (SGPT) Piccolo, Waived 24 10 - 47 U/L   AST (SGOT) Piccolo, Waived 24  11 - 38 U/L   Cholesterol Piccolo, Waived 157 <200 mg/dL   HDL Chol Piccolo, Waived 43 (L) >59 mg/dL   Triglycerides Piccolo,Waived 77 <150 mg/dL   Chol/HDL Ratio Piccolo,Waive 3.6 mg/dL   LDL Chol Calc Piccolo Waived 99 <100 mg/dL   VLDL Chol Calc Piccolo,Waive 15 <30 mg/dL  Basic metabolic panel  Result Value Ref Range   Glucose 84 65 - 99 mg/dL   BUN 8 8 - 27 mg/dL   Creatinine, Ser 3.66 0.57 - 1.00 mg/dL   GFR calc non Af Amer 87 >59 mL/min/1.73   GFR calc Af Amer 100 >59 mL/min/1.73   BUN/Creatinine Ratio 11 11 - 26   Sodium 135 134 - 144 mmol/L   Potassium 4.2 3.5 - 5.2 mmol/L     Chloride 98 96 - 106 mmol/L   CO2 24 18 - 29 mmol/L   Calcium 10.0 8.7 - 10.3 mg/dL  Hepatitis C Antibody  Result Value Ref Range   Hep C Virus Ab <0.1 0.0 - 0.9 s/co ratio      Assessment & Plan:   Problem List Items Addressed This Visit      Cardiovascular and Mediastinum   Coronary artery disease involving native heart (Chronic)    The current medical regimen is effective;  continue present plan and medications.       Relevant Medications   amLODipine (NORVASC) 10 MG tablet   atorvastatin (LIPITOR) 80 MG tablet   clopidogrel (PLAVIX) 75 MG tablet   losartan (COZAAR) 100 MG tablet   metoprolol (LOPRESSOR) 50 MG tablet   Essential hypertension    The current medical regimen is effective;  continue present plan and medications.       Relevant Medications   amLODipine (NORVASC) 10 MG tablet   atorvastatin (LIPITOR) 80 MG tablet   losartan (COZAAR) 100 MG tablet   metoprolol (LOPRESSOR) 50 MG tablet     Other   Hyperlipidemia    The current medical regimen is effective;  continue present plan and medications.       Relevant Medications   amLODipine (NORVASC) 10 MG tablet   atorvastatin (LIPITOR) 80 MG tablet   losartan (COZAAR) 100 MG tablet   metoprolol (LOPRESSOR) 50 MG tablet    Other Visit Diagnoses    Encounter for screening mammogram for breast cancer    -  Primary   Relevant Orders   MM Digital Screening   Need for Tdap vaccination       Relevant Orders   Tdap vaccine greater than or equal to 7yo IM (Completed)   Healthcare maintenance       Relevant Orders   Pneumococcal polysaccharide vaccine 23-valent greater than or equal to 2yo subcutaneous/IM (Completed)   Well adult exam       Relevant Orders   CBC with Differential/Platelet   Comprehensive metabolic panel   Lipid Panel w/o Chol/HDL Ratio   TSH   Urinalysis, Routine w reflex microscopic (not at [facility])       Follow up plan: Return in about 6 months (around 09/24/2016), or if symptoms  worsen or fail to improve, for BMP, lipids, alt, ast.

## 2016-03-25 ENCOUNTER — Encounter: Payer: Self-pay | Admitting: Family Medicine

## 2016-03-25 LAB — COMPREHENSIVE METABOLIC PANEL
A/G RATIO: 1.1 — AB (ref 1.2–2.2)
ALBUMIN: 3.7 g/dL (ref 3.6–4.8)
ALT: 13 IU/L (ref 0–32)
AST: 21 IU/L (ref 0–40)
Alkaline Phosphatase: 61 IU/L (ref 39–117)
BUN / CREAT RATIO: 13 (ref 12–28)
BUN: 14 mg/dL (ref 8–27)
Bilirubin Total: 0.6 mg/dL (ref 0.0–1.2)
CALCIUM: 10 mg/dL (ref 8.7–10.3)
CO2: 22 mmol/L (ref 18–29)
CREATININE: 1.06 mg/dL — AB (ref 0.57–1.00)
Chloride: 97 mmol/L (ref 96–106)
GFR, EST AFRICAN AMERICAN: 63 mL/min/{1.73_m2} (ref >59)
GFR, EST NON AFRICAN AMERICAN: 54 mL/min/{1.73_m2} — AB (ref >59)
GLOBULIN, TOTAL: 3.5 g/dL (ref 1.5–4.5)
Glucose: 87 mg/dL (ref 65–99)
POTASSIUM: 3.9 mmol/L (ref 3.5–5.2)
SODIUM: 138 mmol/L (ref 134–144)
TOTAL PROTEIN: 7.2 g/dL (ref 6.0–8.5)

## 2016-03-25 LAB — CBC WITH DIFFERENTIAL/PLATELET
BASOS: 0 %
Basophils Absolute: 0 10*3/uL (ref 0.0–0.2)
EOS (ABSOLUTE): 0.1 10*3/uL (ref 0.0–0.4)
EOS: 3 %
HEMATOCRIT: 38.4 % (ref 34.0–46.6)
HEMOGLOBIN: 12.3 g/dL (ref 11.1–15.9)
IMMATURE GRANS (ABS): 0 10*3/uL (ref 0.0–0.1)
IMMATURE GRANULOCYTES: 0 %
Lymphocytes Absolute: 2 10*3/uL (ref 0.7–3.1)
Lymphs: 38 %
MCH: 25.5 pg — ABNORMAL LOW (ref 26.6–33.0)
MCHC: 32 g/dL (ref 31.5–35.7)
MCV: 80 fL (ref 79–97)
MONOCYTES: 7 %
MONOS ABS: 0.4 10*3/uL (ref 0.1–0.9)
NEUTROS PCT: 52 %
Neutrophils Absolute: 2.8 10*3/uL (ref 1.4–7.0)
Platelets: 245 10*3/uL (ref 150–379)
RBC: 4.82 x10E6/uL (ref 3.77–5.28)
RDW: 15.8 % — ABNORMAL HIGH (ref 12.3–15.4)
WBC: 5.3 10*3/uL (ref 3.4–10.8)

## 2016-03-25 LAB — LIPID PANEL W/O CHOL/HDL RATIO
Cholesterol, Total: 196 mg/dL (ref 100–199)
HDL: 40 mg/dL (ref >39)
LDL CALC: 131 mg/dL — AB (ref 0–99)
TRIGLYCERIDES: 126 mg/dL (ref 0–149)
VLDL Cholesterol Cal: 25 mg/dL (ref 5–40)

## 2016-03-25 LAB — TSH: TSH: 2.43 u[IU]/mL (ref 0.450–4.500)

## 2016-06-01 ENCOUNTER — Ambulatory Visit (INDEPENDENT_AMBULATORY_CARE_PROVIDER_SITE_OTHER): Payer: Commercial Managed Care - HMO | Admitting: Interventional Cardiology

## 2016-06-01 ENCOUNTER — Encounter: Payer: Self-pay | Admitting: Interventional Cardiology

## 2016-06-01 ENCOUNTER — Encounter (INDEPENDENT_AMBULATORY_CARE_PROVIDER_SITE_OTHER): Payer: Self-pay

## 2016-06-01 VITALS — BP 148/100 | HR 50 | Ht 62.0 in | Wt 225.6 lb

## 2016-06-01 DIAGNOSIS — I2510 Atherosclerotic heart disease of native coronary artery without angina pectoris: Secondary | ICD-10-CM

## 2016-06-01 DIAGNOSIS — I5032 Chronic diastolic (congestive) heart failure: Secondary | ICD-10-CM

## 2016-06-01 DIAGNOSIS — I10 Essential (primary) hypertension: Secondary | ICD-10-CM

## 2016-06-01 NOTE — Patient Instructions (Signed)

## 2016-06-01 NOTE — Progress Notes (Signed)
Cardiology Office Note    Date:  06/01/2016   ID:  Jennifer Grant, DOB [birth year], MRN [id number]  PCP:  Vonita Moss, MD  Cardiologist: Lesleigh Noe, MD   Chief Complaint  Patient presents with  . Coronary Artery Disease    History of Present Illness:  Jennifer Grant is a 65 y.o. female  who presents for Follow-up of coronary artery disease and recent acute infarction due to stent thrombosis. She has history of hypertension, hyperlipidemia, mitral valve prolapse  The patient is doing well and has non cardiac complaints.  Compliant with medications other than aspirin.  Past Medical History:  Diagnosis Date  . Anginal pain (HCC)   . 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    "got booster shot a few times"  . GERD (gastroesophageal reflux disease)   . Hyperlipidemia   . Menopausal state   . Mitral valve disorder   . Myocardial infarction 2004; 06/06/2015    Past Surgical History:  Procedure Laterality Date  . CARDIAC CATHETERIZATION N/A 06/08/2015   Procedure: Left Heart Cath and Coronary Angiography;  Surgeon: Lyn Records, MD;  Location: [facility] INVASIVE CV LAB;  Service: Cardiovascular;  Laterality: N/A;  . CARDIAC CATHETERIZATION N/A 06/08/2015   Procedure: Coronary Stent Intervention;  Surgeon: Lyn Records, MD;  Location: [facility] INVASIVE CV LAB;  Service: Cardiovascular;  Laterality: N/A;  . CARDIAC CATHETERIZATION N/A 07/16/2015   Procedure: Coronary Stent Intervention;  Surgeon: Lyn Records, MD;  Location: [facility] INVASIVE CV LAB;  Service: Cardiovascular;  Laterality: N/A;  . CORONARY ANGIOPLASTY WITH STENT PLACEMENT     "6 stents before 05/2015"  . KNEE ARTHROSCOPY Left 1990's  . TUBAL LIGATION  ~ 1978    Current Medications: Outpatient Medications Prior to Visit  Medication Sig Dispense Refill  . amLODipine (NORVASC) 10 MG tablet Take 1 tablet (10 mg total) by mouth daily. 90 tablet 4  . aspirin 81 MG chewable tablet Chew 1 tablet (81 mg total) by mouth daily.    Marland Kitchen atorvastatin  (LIPITOR) 80 MG tablet Take 1 tablet (80 mg total) by mouth daily at 6 PM. 90 tablet 4  . clopidogrel (PLAVIX) 75 MG tablet Take 1 tablet (75 mg total) by mouth daily. 90 tablet 4  . isosorbide mononitrate (IMDUR) 60 MG 24 hr tablet Take 1 tablet (60 mg total) by mouth daily. 30 tablet 11  . KLOR-CON M20 20 MEQ tablet TAKE ONE TABLET BY MOUTH ONCE DAILY 30 tablet 3  . losartan (COZAAR) 100 MG tablet Take 1 tablet (100 mg total) by mouth daily. 90 tablet 4  . metoprolol (LOPRESSOR) 50 MG tablet Take 1 tablet (50 mg total) by mouth 2 (two) times daily. 180 tablet 4  . Multiple Vitamin (MULTIVITAMIN WITH MINERALS) TABS tablet Take 1 tablet by mouth daily.    . nitroGLYCERIN (NITROSTAT) 0.4 MG SL tablet Place 1 tablet (0.4 mg total) under the tongue every 5 (five) minutes x 3 doses as needed for chest pain. 25 tablet 3  . RESVERATROL PO Take 1 tablet by mouth daily.     No facility-administered medications prior to visit.      Allergies:   Brilinta [ticagrelor]; Latex; Penicillins; and Tekturna [aliskiren]   Social History   Social History  . Marital status: Widowed    Spouse name: N/A  . Number of children: N/A  . Years of education: N/A   Social History Main Topics  . Smoking status: Never Smoker  . Smokeless tobacco: Never Used  .  Alcohol use No  . Drug use: No  . Sexual activity: No   Other Topics Concern  . None   Social History Narrative  . None     Family History:  The patient's family history includes Healthy in her brother and mother; Kidney disease in her father; Prostate cancer in her father.   ROS:   Please see the history of present illness.    Vision has film over right eye. Not following recommendation of ophthalmologist.Knee and back pain. Leg pain, skipped heart beats. All other systems reviewed and are negative.   PHYSICAL EXAM:   VS:  BP (!) 148/100   Pulse (!) 50   Ht 5\' 2"  (1.575 m)   Wt 225 lb 9.6 oz (102.3 kg)   LMP  (LMP Unknown)   BMI 41.26  kg/m    GEN: Well nourished, well developed, in no acute distress  HEENT: normal  Neck: no JVD, carotid bruits, or masses Cardiac: RRR; no murmurs, rubs, or gallops,no edema  Respiratory:  clear to auscultation bilaterally, normal work of breathing GI: soft, nontender, nondistended, + BS MS: no deformity or atrophy  Skin: warm and dry, no rash Neuro:  Alert and Oriented x 3, Strength and sensation are intact Psych: euthymic mood, full affect  Wt Readings from Last 3 Encounters:  06/01/16 225 lb 9.6 oz (102.3 kg)  03/24/16 229 lb (103.9 kg)  11/06/15 229 lb (103.9 kg)      Studies/Labs Reviewed:   EKG:  EKG  Sinus bradycardia 50 bpm. Nonspecific T wave flattening. Otherwise no significant abnormalities and no change compared to prior.  Recent Labs: 06/08/2015: B Natriuretic Peptide 34.1; Magnesium 2.0 07/23/2015: Hemoglobin CANCELED 03/24/2016: ALT 13; BUN 14; Creatinine, Ser 1.06; Platelets 245; Potassium 3.9; Sodium 138; TSH 2.430   Lipid Panel    Component Value Date/Time   CHOL 196 03/24/2016 0953   CHOL 157 11/04/2015 1023   TRIG 126 03/24/2016 0953   TRIG 77 11/04/2015 1023   HDL 40 03/24/2016 0953   CHOLHDL 3.8 07/16/2015 0733   VLDL 15 11/04/2015 1023   LDLCALC 131 (H) 03/24/2016 0953    Additional studies/ records that were reviewed today include:  No new studies    ASSESSMENT:    1. Coronary artery disease involving native coronary artery of native heart without angina pectoris   2. Chronic diastolic heart failure (HCC)   3. Essential hypertension      PLAN:  In order of problems listed above:  1. Aerobic activity is advocated. Continue current medical regimen including clopidogrel. One-year follow-up. 2. Encouraged aerobic activity. Encouraged 2 g sodium diet. Encourage weight loss. 3. 2 g sodium diet. Target blood pressure 140/90 mmHg. This was discussed with the patient.    Medication Adjustments/Labs and Tests Ordered: Current medicines are  reviewed at length with the patient today.  Concerns regarding medicines are outlined above.  Medication changes, Labs and Tests ordered today are listed in the Patient Instructions below. There are no Patient Instructions on file for this visit.   Signed, Lesleigh NoeHenry W Jaelon Gatley III, MD  06/01/2016 12:06 PM    [facility] [address], [place], [place]  [address] Phone: [phone]; Fax: [phone]

## 2016-08-18 ENCOUNTER — Telehealth: Payer: Self-pay | Admitting: Family Medicine

## 2016-08-18 NOTE — Telephone Encounter (Signed)
Patient notified

## 2016-08-18 NOTE — Telephone Encounter (Signed)
Routing to provider.  Does patient need to come in for an appointment or any recommendations for OTC?

## 2016-08-18 NOTE — Telephone Encounter (Signed)
Pt called and stated she had a cough and would like to know if she could have something sent to [facility] [address]

## 2016-08-18 NOTE — Telephone Encounter (Signed)
Mucinex Tylenol sinus is all she needs.

## 2016-09-03 ENCOUNTER — Other Ambulatory Visit: Payer: Self-pay | Admitting: Physician Assistant

## 2016-09-03 ENCOUNTER — Other Ambulatory Visit: Payer: Self-pay | Admitting: Interventional Cardiology

## 2016-09-03 NOTE — Telephone Encounter (Signed)
Review for refill. 

## 2016-09-03 NOTE — Telephone Encounter (Signed)
Rx refill sent to pharmacy. 

## 2016-10-05 ENCOUNTER — Ambulatory Visit (INDEPENDENT_AMBULATORY_CARE_PROVIDER_SITE_OTHER): Payer: Medicare HMO | Admitting: Family Medicine

## 2016-10-05 ENCOUNTER — Encounter: Payer: Self-pay | Admitting: Family Medicine

## 2016-10-05 VITALS — BP 199/126 | HR 76 | Ht 63.0 in | Wt 225.0 lb

## 2016-10-05 DIAGNOSIS — K5900 Constipation, unspecified: Secondary | ICD-10-CM

## 2016-10-05 DIAGNOSIS — E785 Hyperlipidemia, unspecified: Secondary | ICD-10-CM | POA: Diagnosis not present

## 2016-10-05 DIAGNOSIS — I10 Essential (primary) hypertension: Secondary | ICD-10-CM

## 2016-10-05 DIAGNOSIS — Z1231 Encounter for screening mammogram for malignant neoplasm of breast: Secondary | ICD-10-CM

## 2016-10-05 DIAGNOSIS — Z1239 Encounter for other screening for malignant neoplasm of breast: Secondary | ICD-10-CM

## 2016-10-05 LAB — LP+ALT+AST PICCOLO, WAIVED
ALT (SGPT) Piccolo, Waived: 18 U/L (ref 10–47)
AST (SGOT) PICCOLO, WAIVED: 15 U/L (ref 11–38)
CHOL/HDL RATIO PICCOLO,WAIVE: 4.3 mg/dL
CHOLESTEROL PICCOLO, WAIVED: 227 mg/dL — AB (ref <200)
HDL Chol Piccolo, Waived: 53 mg/dL — ABNORMAL LOW (ref >59)
LDL CHOL CALC PICCOLO WAIVED: 153 mg/dL — AB (ref <100)
TRIGLYCERIDES PICCOLO,WAIVED: 104 mg/dL (ref <150)
VLDL Chol Calc Piccolo,Waive: 21 mg/dL (ref <30)

## 2016-10-05 MED ORDER — NITROGLYCERIN 0.4 MG SL SUBL: 0.4000 mg | SUBLINGUAL_TABLET | SUBLINGUAL | 3 refills | Status: DC | PRN

## 2016-10-05 NOTE — Assessment & Plan Note (Signed)
Severe hypertension off medications Discuss signs and symptoms of stroke or from severe hypertension discuss calling 911 going to the emergency room. Patient will start her medicines today recheck in 2 days earlier at the emergency room if problems.

## 2016-10-05 NOTE — Progress Notes (Signed)
BP (!) 199/126   Pulse 76   Ht 5\' 3"  (1.6 m)   Wt 225 lb (102.1 kg)   LMP  (LMP Unknown)   SpO2 98%   BMI 39.86 kg/m    Subjective:    Patient ID: Jennifer Grant, female    DOB: 05/20/1951, 66 y.o.   MRN: [id number]  HPI: Jennifer Grant is a 66 y.o. female  Chief Complaint  Patient presents with  . Follow-up  . Hypertension   Patient struggling with blood pressure again having a great deal of family stress with her daughter and police action with her boyfriend. Patient's been bothered by severe constipation has taken some [employer] locks with some upset stomach and discomfort as a consequence patient hasn't taken her blood pressure medicine for the last 4 days. Patient with no signs or symptoms from severe hypertension. No complaints of headache or neurovascular symptoms. Relevant past medical, surgical, family and social history reviewed and updated as indicated. Interim medical history since our last visit reviewed. Allergies and medications reviewed and updated.  Review of Systems  Constitutional: Negative.   Cardiovascular: Negative.   Neurological: Negative.     Per HPI unless specifically indicated above     Objective:    BP (!) 199/126   Pulse 76   Ht 5\' 3"  (1.6 m)   Wt 225 lb (102.1 kg)   LMP  (LMP Unknown)   SpO2 98%   BMI 39.86 kg/m   Wt Readings from Last 3 Encounters:  10/05/16 225 lb (102.1 kg)  06/01/16 225 lb 9.6 oz (102.3 kg)  03/24/16 229 lb (103.9 kg)    Physical Exam  Constitutional: She is oriented to person, place, and time. She appears well-developed and well-nourished. No distress.  HENT:  Head: Normocephalic and atraumatic.  Right Ear: Hearing normal.  Left Ear: Hearing normal.  Nose: Nose normal.  Eyes: Conjunctivae and lids are normal. Right eye exhibits no discharge. Left eye exhibits no discharge. No scleral icterus.  Cardiovascular: Normal rate, regular rhythm and normal heart sounds.   Pulmonary/Chest: Effort normal and breath  sounds normal. No respiratory distress.  Musculoskeletal: Normal range of motion.  Neurological: She is alert and oriented to person, place, and time.  Skin: Skin is intact. No rash noted.  Psychiatric: She has a normal mood and affect. Her speech is normal and behavior is normal. Judgment and thought content normal. Cognition and memory are normal.    Results for orders placed or performed in visit on 03/24/16  Microscopic Examination  Result Value Ref Range   WBC, UA 0-5 0 - 5 /hpf   RBC, UA 0-2 0 - 2 /hpf   Epithelial Cells (non renal) 0-10 0 - 10 /hpf   Bacteria, UA Few None seen/Few  CBC with Differential/Platelet  Result Value Ref Range   WBC 5.3 3.4 - 10.8 x10E3/uL   RBC 4.82 3.77 - 5.28 x10E6/uL   Hemoglobin 12.3 11.1 - 15.9 g/dL   Hematocrit 40.938.4 81.134.0 - 46.6 %   MCV 80 79 - 97 fL   MCH 25.5 (L) 26.6 - 33.0 pg   MCHC 32.0 31.5 - 35.7 g/dL   RDW 91.415.8 (H) 78.212.3 - 95.615.4 %   Platelets 245 150 - 379 x10E3/uL   Neutrophils 52 %   Lymphs 38 %   Monocytes 7 %   Eos 3 %   Basos 0 %   Neutrophils Absolute 2.8 1.4 - 7.0 x10E3/uL   Lymphocytes Absolute 2.0 0.7 -  3.1 x10E3/uL   Monocytes Absolute 0.4 0.1 - 0.9 x10E3/uL   EOS (ABSOLUTE) 0.1 0.0 - 0.4 x10E3/uL   Basophils Absolute 0.0 0.0 - 0.2 x10E3/uL   Immature Granulocytes 0 %   Immature Grans (Abs) 0.0 0.0 - 0.1 x10E3/uL  Comprehensive metabolic panel  Result Value Ref Range   Glucose 87 65 - 99 mg/dL   BUN 14 8 - 27 mg/dL   Creatinine, Ser 4.09 (H) 0.57 - 1.00 mg/dL   GFR calc non Af Amer 54 (L) >59 mL/min/1.73   GFR calc Af Amer 63 >59 mL/min/1.73   BUN/Creatinine Ratio 13 12 - 28   Sodium 138 134 - 144 mmol/L   Potassium 3.9 3.5 - 5.2 mmol/L   Chloride 97 96 - 106 mmol/L   CO2 22 18 - 29 mmol/L   Calcium 10.0 8.7 - 10.3 mg/dL   Total Protein 7.2 6.0 - 8.5 g/dL   Albumin 3.7 3.6 - 4.8 g/dL   Globulin, Total 3.5 1.5 - 4.5 g/dL   Albumin/Globulin Ratio 1.1 (L) 1.2 - 2.2   Bilirubin Total 0.6 0.0 - 1.2 mg/dL    Alkaline Phosphatase 61 39 - 117 IU/L   AST 21 0 - 40 IU/L   ALT 13 0 - 32 IU/L  Lipid Panel w/o Chol/HDL Ratio  Result Value Ref Range   Cholesterol, Total 196 100 - 199 mg/dL   Triglycerides 811 0 - 149 mg/dL   HDL 40 >91 mg/dL   VLDL Cholesterol Cal 25 5 - 40 mg/dL   LDL Calculated 478 (H) 0 - 99 mg/dL  TSH  Result Value Ref Range   TSH 2.430 0.450 - 4.500 uIU/mL  Urinalysis, Routine w reflex microscopic (not at [facility])  Result Value Ref Range   Specific Gravity, UA <1.005 (L) 1.005 - 1.030   pH, UA 5.5 5.0 - 7.5   Color, UA Yellow Yellow   Appearance Ur Clear Clear   Leukocytes, UA Trace (A) Negative   Protein, UA Negative Negative/Trace   Glucose, UA Negative Negative   Ketones, UA Negative Negative   RBC, UA Trace (A) Negative   Bilirubin, UA Negative Negative   Urobilinogen, Ur 0.2 0.2 - 1.0 mg/dL   Nitrite, UA Negative Negative   Microscopic Examination See below:       Assessment & Plan:   Problem List Items Addressed This Visit      Cardiovascular and Mediastinum   Essential hypertension - Primary    Severe hypertension off medications Discuss signs and symptoms of stroke or from severe hypertension discuss calling 911 going to the emergency room. Patient will start her medicines today recheck in 2 days earlier at the emergency room if problems.      Relevant Orders   Basic Metabolic Panel (BMET)   LP+ALT+AST Piccolo, Waived     Other   Hyperlipidemia   Relevant Orders   Basic Metabolic Panel (BMET)   LP+ALT+AST Piccolo, [employer]    Other Visit Diagnoses    Breast cancer screening       Relevant Orders   MM DIGITAL SCREENING BILATERAL   Constipation, unspecified constipation type       Discussed constipation care and treatment reviewed patient's colonoscopy from 2 years ago which was normal except for polyps.       Follow up plan: Return for 2 days blood pressure recheck.

## 2016-10-05 NOTE — Assessment & Plan Note (Signed)
Cholesterol also elevated patient will restart medications and do better with diet.

## 2016-10-06 ENCOUNTER — Encounter: Payer: Self-pay | Admitting: Family Medicine

## 2016-10-06 LAB — BASIC METABOLIC PANEL
BUN / CREAT RATIO: 13 (ref 12–28)
BUN: 10 mg/dL (ref 8–27)
CALCIUM: 9.8 mg/dL (ref 8.7–10.3)
CHLORIDE: 95 mmol/L — AB (ref 96–106)
CO2: 25 mmol/L (ref 18–29)
Creatinine, Ser: 0.76 mg/dL (ref 0.57–1.00)
GFR, EST AFRICAN AMERICAN: 94 mL/min/{1.73_m2} (ref >59)
GFR, EST NON AFRICAN AMERICAN: 81 mL/min/{1.73_m2} (ref >59)
Glucose: 88 mg/dL (ref 65–99)
Potassium: 3.7 mmol/L (ref 3.5–5.2)
Sodium: 136 mmol/L (ref 134–144)

## 2016-10-07 ENCOUNTER — Ambulatory Visit (INDEPENDENT_AMBULATORY_CARE_PROVIDER_SITE_OTHER): Payer: Medicare HMO | Admitting: Family Medicine

## 2016-10-07 ENCOUNTER — Encounter: Payer: Self-pay | Admitting: Family Medicine

## 2016-10-07 DIAGNOSIS — I10 Essential (primary) hypertension: Secondary | ICD-10-CM

## 2016-10-07 DIAGNOSIS — Z9114 Patient's other noncompliance with medication regimen: Secondary | ICD-10-CM | POA: Diagnosis not present

## 2016-10-07 NOTE — Assessment & Plan Note (Signed)
Profound hypertension improving with near control and resolution of headache symptoms.

## 2016-10-07 NOTE — Progress Notes (Signed)
BP (!) 148/86   Pulse (!) 56   Ht 5\' 3"  (1.6 m)   Wt 225 lb (102.1 kg)   LMP  (LMP Unknown)   SpO2 96%   BMI 39.86 kg/m    Subjective:    Patient ID: Jennifer Grant, female    DOB: 1950-12-16, 66 y.o.   MRN: [id number]  HPI: Jennifer Grant is a 66 y.o. female  Chief Complaint  Patient presents with  . Follow-up  . Hypertension   Patient taking medications headache is gone away with better control of blood pressure no neurological signs or symptoms  stress from daughter is getting better Taking medications faithfully  Relevant past medical, surgical, family and social history reviewed and updated as indicated. Interim medical history since our last visit reviewed. Allergies and medications reviewed and updated.  Review of Systems  Constitutional: Negative.   Respiratory: Negative.   Cardiovascular: Negative.     Per HPI unless specifically indicated above     Objective:    BP (!) 148/86   Pulse (!) 56   Ht 5\' 3"  (1.6 m)   Wt 225 lb (102.1 kg)   LMP  (LMP Unknown)   SpO2 96%   BMI 39.86 kg/m   Wt Readings from Last 3 Encounters:  10/07/16 225 lb (102.1 kg)  10/05/16 225 lb (102.1 kg)  06/01/16 225 lb 9.6 oz (102.3 kg)    Physical Exam  Constitutional: She is oriented to person, place, and time. She appears well-developed and well-nourished. No distress.  HENT:  Head: Normocephalic and atraumatic.  Right Ear: Hearing normal.  Left Ear: Hearing normal.  Nose: Nose normal.  Eyes: Conjunctivae and lids are normal. Right eye exhibits no discharge. Left eye exhibits no discharge. No scleral icterus.  Cardiovascular: Normal rate, regular rhythm and normal heart sounds.   Pulmonary/Chest: Effort normal and breath sounds normal. No respiratory distress.  Musculoskeletal: Normal range of motion.  Neurological: She is alert and oriented to person, place, and time. A cranial nerve deficit is present. She exhibits normal muscle tone. Coordination normal.  No  change in neurological status appears stable  Skin: Skin is intact. No rash noted.  Psychiatric: She has a normal mood and affect. Her speech is normal and behavior is normal. Judgment and thought content normal. Cognition and memory are normal.    Results for orders placed or performed in visit on 10/05/16  Basic Metabolic Panel (BMET)  Result Value Ref Range   Glucose 88 65 - 99 mg/dL   BUN 10 8 - 27 mg/dL   Creatinine, Ser 7.82 0.57 - 1.00 mg/dL   GFR calc non Af Amer 81 >59 mL/min/1.73   GFR calc Af Amer 94 >59 mL/min/1.73   BUN/Creatinine Ratio 13 12 - 28   Sodium 136 134 - 144 mmol/L   Potassium 3.7 3.5 - 5.2 mmol/L   Chloride 95 (L) 96 - 106 mmol/L   CO2 25 18 - 29 mmol/L   Calcium 9.8 8.7 - 10.3 mg/dL  LP+ALT+AST Piccolo, Waived  Result Value Ref Range   ALT (SGPT) Piccolo, Waived 18 10 - 47 U/L   AST (SGOT) Piccolo, Waived 15 11 - 38 U/L   Cholesterol Piccolo, Waived 227 (H) <200 mg/dL   HDL Chol Piccolo, Waived 53 (L) >59 mg/dL   Triglycerides Piccolo,Waived 104 <150 mg/dL   Chol/HDL Ratio Piccolo,Waive 4.3 mg/dL   LDL Chol Calc Piccolo Waived 153 (H) <100 mg/dL   VLDL Chol Calc Piccolo,Waive 21 <30  mg/dL      Assessment & Plan:   Problem List Items Addressed This Visit      Cardiovascular and Mediastinum   Essential hypertension    Profound hypertension improving with near control and resolution of headache symptoms.        Other   Noncompliance with medication regimen    Reviewed compliance issues with patient again barriers and address these issues again with patient patient indicates she will do better taking her medications every day no matter what        patient given again on signs or symptoms change in neurological status to go to the emergency room.  Follow up plan: Return in about 4 weeks (around 11/04/2016) for BMP.

## 2016-10-07 NOTE — Assessment & Plan Note (Signed)
Reviewed compliance issues with patient again barriers and address these issues again with patient patient indicates she will do better taking her medications every day no matter what

## 2016-11-18 ENCOUNTER — Ambulatory Visit (INDEPENDENT_AMBULATORY_CARE_PROVIDER_SITE_OTHER): Payer: Medicare HMO | Admitting: Family Medicine

## 2016-11-18 ENCOUNTER — Encounter: Payer: Self-pay | Admitting: Family Medicine

## 2016-11-18 VITALS — BP 184/99 | HR 38 | Wt 227.4 lb

## 2016-11-18 DIAGNOSIS — E785 Hyperlipidemia, unspecified: Secondary | ICD-10-CM | POA: Diagnosis not present

## 2016-11-18 DIAGNOSIS — I2510 Atherosclerotic heart disease of native coronary artery without angina pectoris: Secondary | ICD-10-CM | POA: Diagnosis not present

## 2016-11-18 DIAGNOSIS — I10 Essential (primary) hypertension: Secondary | ICD-10-CM

## 2016-11-18 NOTE — Assessment & Plan Note (Signed)
Poor control off medications as expected. Patient will restart medications observe headache symptoms without Imdur

## 2016-11-18 NOTE — Progress Notes (Signed)
BP (!) 184/99   Pulse (!) 38   Wt 227 lb 6.4 oz (103.1 kg)   LMP  (LMP Unknown)   SpO2 95%   BMI 40.28 kg/m    Subjective:    Patient ID: Jennifer Grant, female    DOB: [birth year], 66 y.o.   MRN: [id number]  HPI: Jennifer Grant is a 66 y.o. female  Chief Complaint  Patient presents with  . Follow-up  . Hypertension  . Headache  . Flank Pain  Patient follow-up not taking her medications because of severe headaches. Patient doesn't have headaches when she doesn't take her medications. On review patient's blood pressure up today of course patient's taking I'm door which is most likely causing her headaches. Patient with no cardiovascular symptoms  Relevant past medical, surgical, family and social history reviewed and updated as indicated. Interim medical history since our last visit reviewed. Allergies and medications reviewed and updated.  Review of Systems  Constitutional: Negative.   Respiratory: Negative.   Cardiovascular: Negative.     Per HPI unless specifically indicated above     Objective:    BP (!) 184/99   Pulse (!) 38   Wt 227 lb 6.4 oz (103.1 kg)   LMP  (LMP Unknown)   SpO2 95%   BMI 40.28 kg/m   Wt Readings from Last 3 Encounters:  11/18/16 227 lb 6.4 oz (103.1 kg)  10/07/16 225 lb (102.1 kg)  10/05/16 225 lb (102.1 kg)    Physical Exam  Constitutional: She is oriented to person, place, and time. She appears well-developed and well-nourished.  HENT:  Head: Normocephalic and atraumatic.  Eyes: Conjunctivae and EOM are normal.  Neck: Normal range of motion.  Cardiovascular: Normal rate, regular rhythm and normal heart sounds.   Pulmonary/Chest: Effort normal and breath sounds normal.  Musculoskeletal: Normal range of motion.  Neurological: She is alert and oriented to person, place, and time.  Skin: No erythema.  Psychiatric: She has a normal mood and affect. Her behavior is normal. Judgment and thought content normal.    Results for  orders placed or performed in visit on 10/05/16  Basic Metabolic Panel (BMET)  Result Value Ref Range   Glucose 88 65 - 99 mg/dL   BUN 10 8 - 27 mg/dL   Creatinine, Ser 0.10 0.57 - 1.00 mg/dL   GFR calc non Af Amer 81 >59 mL/min/1.73   GFR calc Af Amer 94 >59 mL/min/1.73   BUN/Creatinine Ratio 13 12 - 28   Sodium 136 134 - 144 mmol/L   Potassium 3.7 3.5 - 5.2 mmol/L   Chloride 95 (L) 96 - 106 mmol/L   CO2 25 18 - 29 mmol/L   Calcium 9.8 8.7 - 10.3 mg/dL  LP+ALT+AST Piccolo, Waived  Result Value Ref Range   ALT (SGPT) Piccolo, Waived 18 10 - 47 U/L   AST (SGOT) Piccolo, Waived 15 11 - 38 U/L   Cholesterol Piccolo, Waived 227 (H) <200 mg/dL   HDL Chol Piccolo, Waived 53 (L) >59 mg/dL   Triglycerides Piccolo,Waived 104 <150 mg/dL   Chol/HDL Ratio Piccolo,Waive 4.3 mg/dL   LDL Chol Calc Piccolo Waived 153 (H) <100 mg/dL   VLDL Chol Calc Piccolo,Waive 21 <30 mg/dL      Assessment & Plan:   Problem List Items Addressed This Visit      Cardiovascular and Mediastinum   Coronary artery disease involving native heart (Chronic)    Patient was stable angina symptoms but having side effects from  Imdur not having symptoms or headaches when not taking Imdur so will discontinue for now and observe headaches response and angina symptoms. If angina symptoms or recurrent may consider lower dose Imdur.      Essential hypertension - Primary    Poor control off medications as expected. Patient will restart medications observe headache symptoms without Imdur      Relevant Orders   Basic metabolic panel     Other   Hyperlipidemia   Relevant Orders   Basic metabolic panel       Follow up plan: Return in about 4 weeks (around 12/16/2016) for BP check.

## 2016-11-18 NOTE — Assessment & Plan Note (Signed)
Patient was stable angina symptoms but having side effects from Imdur not having symptoms or headaches when not taking Imdur so will discontinue for now and observe headaches response and angina symptoms. If angina symptoms or recurrent may consider lower dose Imdur.

## 2016-11-19 LAB — BASIC METABOLIC PANEL
BUN / CREAT RATIO: 14 (ref 12–28)
BUN: 8 mg/dL (ref 8–27)
CO2: 25 mmol/L (ref 18–29)
CREATININE: 0.59 mg/dL (ref 0.57–1.00)
Calcium: 9.8 mg/dL (ref 8.7–10.3)
Chloride: 97 mmol/L (ref 96–106)
GFR calc Af Amer: 109 mL/min/{1.73_m2} (ref >59)
GFR, EST NON AFRICAN AMERICAN: 94 mL/min/{1.73_m2} (ref >59)
GLUCOSE: 87 mg/dL (ref 65–99)
POTASSIUM: 4.1 mmol/L (ref 3.5–5.2)
SODIUM: 139 mmol/L (ref 134–144)

## 2016-11-22 ENCOUNTER — Encounter: Payer: Self-pay | Admitting: Family Medicine

## 2016-12-23 ENCOUNTER — Ambulatory Visit (INDEPENDENT_AMBULATORY_CARE_PROVIDER_SITE_OTHER): Payer: Medicare HMO | Admitting: Family Medicine

## 2016-12-23 ENCOUNTER — Encounter: Payer: Self-pay | Admitting: Family Medicine

## 2016-12-23 VITALS — BP 212/140 | HR 68 | Ht 60.0 in | Wt 226.0 lb

## 2016-12-23 DIAGNOSIS — I10 Essential (primary) hypertension: Secondary | ICD-10-CM | POA: Diagnosis not present

## 2016-12-23 DIAGNOSIS — Z7189 Other specified counseling: Secondary | ICD-10-CM

## 2016-12-23 DIAGNOSIS — Z91148 Patient's other noncompliance with medication regimen for other reason: Secondary | ICD-10-CM

## 2016-12-23 DIAGNOSIS — Z9114 Patient's other noncompliance with medication regimen: Secondary | ICD-10-CM

## 2016-12-23 NOTE — Progress Notes (Signed)
BP (!) 212/140 (BP Location: Left Arm)   Pulse 68   Ht 5' (1.524 m)   Wt 226 lb (102.5 kg)   LMP  (LMP Unknown)   SpO2 99%   BMI 44.14 kg/m    Subjective:    Patient ID: Jennifer Grant, female    DOB: 09/07/1950, 66 y.o.   MRN: [id number]  HPI: Jennifer Grant is a 66 y.o. female  Chief Complaint  Patient presents with  . Follow-up  . Hypertension   Patient stopped all her medications and is testing God to make her blood pressure better. Has noticed less ankle swelling off amlodipine. Patient with no complaints of CNS symptoms headaches etc. No visual complaints. Relevant past medical, surgical, family and social history reviewed and updated as indicated. Interim medical history since our last visit reviewed. Allergies and medications reviewed and updated.  Review of Systems  Constitutional: Negative.   Respiratory: Negative.   Cardiovascular: Negative.     Per HPI unless specifically indicated above     Objective:    BP (!) 212/140 (BP Location: Left Arm)   Pulse 68   Ht 5' (1.524 m)   Wt 226 lb (102.5 kg)   LMP  (LMP Unknown)   SpO2 99%   BMI 44.14 kg/m   Wt Readings from Last 3 Encounters:  12/23/16 226 lb (102.5 kg)  11/18/16 227 lb 6.4 oz (103.1 kg)  10/07/16 225 lb (102.1 kg)    Physical Exam  Constitutional: She is oriented to person, place, and time. She appears well-developed and well-nourished.  HENT:  Head: Normocephalic and atraumatic.  Eyes: Conjunctivae and EOM are normal.  Neck: Normal range of motion.  Cardiovascular: Normal rate, regular rhythm and normal heart sounds.   Pulmonary/Chest: Effort normal and breath sounds normal.  Musculoskeletal: Normal range of motion.  Neurological: She is alert and oriented to person, place, and time.  Skin: No erythema.  Psychiatric: She has a normal mood and affect. Her behavior is normal. Judgment and thought content normal.    Results for orders placed or performed in visit on 11/18/16  Basic  metabolic panel  Result Value Ref Range   Glucose 87 65 - 99 mg/dL   BUN 8 8 - 27 mg/dL   Creatinine, Ser 4.09 0.57 - 1.00 mg/dL   GFR calc non Af Amer 94 >59 mL/min/1.73   GFR calc Af Amer 109 >59 mL/min/1.73   BUN/Creatinine Ratio 14 12 - 28   Sodium 139 134 - 144 mmol/L   Potassium 4.1 3.5 - 5.2 mmol/L   Chloride 97 96 - 106 mmol/L   CO2 25 18 - 29 mmol/L   Calcium 9.8 8.7 - 10.3 mg/dL      Assessment & Plan:   Problem List Items Addressed This Visit      Cardiovascular and Mediastinum   Essential hypertension - Primary    Dangerously elevated        Other   Noncompliance with medication regimen    Discussed risk of noncompliance and advance care planning with patient Discuss stroke signs and symptoms and calling 911. Discuss worsening neurological symptoms and malignant hypertension 911 and ER visits. Patient states she'll get back on her medications today. Encouraged ASAP      Advance care planning    A voluntary discussion about advance care planning including the explanation and discussion of advance directives was extensively discussed  with the patient.  Explanation about the health care proxy and Living will was  reviewed and packet with forms with explanation of how to fill them out was given.  Patient was offered a separate Advance Care Planning visit for further assistance with forms.            Follow up plan: Return in about 2 weeks (around 01/06/2017).

## 2016-12-23 NOTE — Assessment & Plan Note (Signed)
A voluntary discussion about advance care planning including the explanation and discussion of advance directives was extensively discussed  with the patient.  Explanation about the health care proxy and Living will was reviewed and packet with forms with explanation of how to fill them out was given.  .  Patient was offered a separate Advance Care Planning visit for further assistance with forms.    

## 2016-12-23 NOTE — Assessment & Plan Note (Signed)
Discussed risk of noncompliance and advance care planning with patient Discuss stroke signs and symptoms and calling 911. Discuss worsening neurological symptoms and malignant hypertension 911 and ER visits. Patient states she'll get back on her medications today. Encouraged ASAP

## 2016-12-23 NOTE — Assessment & Plan Note (Signed)
Dangerously elevated

## 2017-01-05 ENCOUNTER — Encounter: Payer: Self-pay | Admitting: Family Medicine

## 2017-01-05 ENCOUNTER — Ambulatory Visit (INDEPENDENT_AMBULATORY_CARE_PROVIDER_SITE_OTHER): Payer: Medicare HMO | Admitting: Family Medicine

## 2017-01-05 DIAGNOSIS — I2510 Atherosclerotic heart disease of native coronary artery without angina pectoris: Secondary | ICD-10-CM | POA: Diagnosis not present

## 2017-01-05 DIAGNOSIS — Z6841 Body Mass Index (BMI) 40.0 and over, adult: Secondary | ICD-10-CM | POA: Diagnosis not present

## 2017-01-05 DIAGNOSIS — I10 Essential (primary) hypertension: Secondary | ICD-10-CM

## 2017-01-05 DIAGNOSIS — Z9114 Patient's other noncompliance with medication regimen: Secondary | ICD-10-CM | POA: Diagnosis not present

## 2017-01-05 NOTE — Assessment & Plan Note (Signed)
Hypertension much improved we will leave medicines alone for now.

## 2017-01-05 NOTE — Assessment & Plan Note (Signed)
Will make appointment with cardiology for October 2018

## 2017-01-05 NOTE — Assessment & Plan Note (Signed)
Work on wt loss.  

## 2017-01-05 NOTE — Progress Notes (Signed)
BP (!) 169/84   Pulse (!) 48   Wt 232 lb (105.2 kg)   LMP  (LMP Unknown)   SpO2 99%   BMI 45.31 kg/m    Subjective:    Patient ID: Jennifer Grant, female    DOB: 03/11/1951, 66 y.o.   MRN: [id number]  HPI: Jennifer Grant is a 66 y.o. female  Chief Complaint  Patient presents with  . Follow-up  . Hypertension  . Groin Pain   Patient follow-up hypertension patient's now taking her medications without side effects felt like her blood pressure got low 1 time but otherwise been taking medications without problems no swelling edema or other issues. On review patient hasn't been to her cardiologist since October on review of his note she is supposed to be on yearly follow-up so we'll go ahead and make an appointment in October 2018. Relevant past medical, surgical, family and social history reviewed and updated as indicated. Interim medical history since our last visit reviewed. Allergies and medications reviewed and updated.  Review of Systems  Constitutional: Negative.   Respiratory: Negative.   Cardiovascular: Negative.     Per HPI unless specifically indicated above     Objective:    BP (!) 169/84   Pulse (!) 48   Wt 232 lb (105.2 kg)   LMP  (LMP Unknown)   SpO2 99%   BMI 45.31 kg/m   Wt Readings from Last 3 Encounters:  01/05/17 232 lb (105.2 kg)  12/23/16 226 lb (102.5 kg)  11/18/16 227 lb 6.4 oz (103.1 kg)    Physical Exam  Constitutional: She is oriented to person, place, and time. She appears well-developed and well-nourished.  HENT:  Head: Normocephalic and atraumatic.  Eyes: Conjunctivae and EOM are normal.  Neck: Normal range of motion.  Cardiovascular: Normal rate, regular rhythm and normal heart sounds.   Pulmonary/Chest: Effort normal and breath sounds normal.  Musculoskeletal: Normal range of motion.  Neurological: She is alert and oriented to person, place, and time.  Skin: No erythema.  Psychiatric: She has a normal mood and affect. Her  behavior is normal. Judgment and thought content normal.    Results for orders placed or performed in visit on 11/18/16  Basic metabolic panel  Result Value Ref Range   Glucose 87 65 - 99 mg/dL   BUN 8 8 - 27 mg/dL   Creatinine, Ser 4.090.59 0.57 - 1.00 mg/dL   GFR calc non Af Amer 94 >59 mL/min/1.73   GFR calc Af Amer 109 >59 mL/min/1.73   BUN/Creatinine Ratio 14 12 - 28   Sodium 139 134 - 144 mmol/L   Potassium 4.1 3.5 - 5.2 mmol/L   Chloride 97 96 - 106 mmol/L   CO2 25 18 - 29 mmol/L   Calcium 9.8 8.7 - 10.3 mg/dL      Assessment & Plan:   Problem List Items Addressed This Visit      Cardiovascular and Mediastinum   Coronary artery disease involving native heart (Chronic)    Will make appointment with cardiology for October 2018      Relevant Orders   Ambulatory referral to Cardiology   Essential hypertension    Hypertension much improved we will leave medicines alone for now.        Other   BMI 45.0-49.9, adult (HCC)    Work on wt loss      Noncompliance with medication regimen    Patient taking medication again reviewed importance of medication on a  regular basis for disease prevention and complication prevention.          Follow up plan: Return in about 2 months (around 03/07/2017) for Physical Exam.

## 2017-01-05 NOTE — Assessment & Plan Note (Signed)
Patient taking medication again reviewed importance of medication on a regular basis for disease prevention and complication prevention.

## 2017-01-11 ENCOUNTER — Ambulatory Visit
Admission: RE | Admit: 2017-01-11 | Discharge: 2017-01-11 | Disposition: A | Discharge status: Home / Self Care | Payer: Commercial Managed Care - HMO | Source: Ambulatory Visit | Attending: Family Medicine | Admitting: Family Medicine

## 2017-01-11 DIAGNOSIS — Z1231 Encounter for screening mammogram for malignant neoplasm of breast: Secondary | ICD-10-CM | POA: Diagnosis not present

## 2017-01-11 DIAGNOSIS — Z1239 Encounter for other screening for malignant neoplasm of breast: Secondary | ICD-10-CM

## 2017-01-12 ENCOUNTER — Encounter: Payer: Self-pay | Admitting: Family Medicine

## 2017-02-27 NOTE — Progress Notes (Signed)
Cardiology Office Note    Date:  02/28/2017   ID:  Jennifer Grant, DOB 03/19/1951, MRN [id number]  PCP:  Steele Sizer, MD  Cardiologist: Lesleigh Noe, MD   Chief Complaint  Patient presents with  . Coronary Artery Disease    History of Present Illness:  Jennifer Grant is a 66 y.o. female who presents for follow-up of coronary artery disease and recent acute infarction due to stent thrombosis. She has history of hypertension, hyperlipidemia, mitral valve prolapse  Doing well. Denies angina. Unable to tolerate Lipitor. Stop taking the medication on her on. His last LDL was 153 and femora 2018. Total cholesterol was 224. At that point she was off Lipitor. She has not resumed statin therapy because of fear related to possible recurrent rash and cough. Relatively sedentary. Not on any particular diet. Denies orthopnea and PND.  Past Medical History:  Diagnosis Date  . Anginal pain (HCC)   . 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    "got booster shot a few times"  . GERD (gastroesophageal reflux disease)   . Hyperlipidemia   . Menopausal state   . Mitral valve disorder   . Myocardial infarction [facility]) 2004; 06/06/2015    Past Surgical History:  Procedure Laterality Date  . CARDIAC CATHETERIZATION N/A 06/08/2015   Procedure: Left Heart Cath and Coronary Angiography;  Surgeon: Lyn Records, MD;  Location: [facility] INVASIVE CV LAB;  Service: Cardiovascular;  Laterality: N/A;  . CARDIAC CATHETERIZATION N/A 06/08/2015   Procedure: Coronary Stent Intervention;  Surgeon: Lyn Records, MD;  Location: [facility] INVASIVE CV LAB;  Service: Cardiovascular;  Laterality: N/A;  . CARDIAC CATHETERIZATION N/A 07/16/2015   Procedure: Coronary Stent Intervention;  Surgeon: Lyn Records, MD;  Location: [facility] INVASIVE CV LAB;  Service: Cardiovascular;  Laterality: N/A;  . CORONARY ANGIOPLASTY WITH STENT PLACEMENT     "6 stents before 05/2015"  . KNEE ARTHROSCOPY Left 1990's  . TUBAL LIGATION  ~ 1978    Current  Medications: Outpatient Medications Prior to Visit  Medication Sig Dispense Refill  . amLODipine (NORVASC) 10 MG tablet Take 1 tablet (10 mg total) by mouth daily. 90 tablet 4  . clopidogrel (PLAVIX) 75 MG tablet Take 1 tablet (75 mg total) by mouth daily. 90 tablet 4  . isosorbide mononitrate (IMDUR) 60 MG 24 hr tablet TAKE ONE TABLET BY MOUTH ONCE DAILY 90 tablet 2  . KLOR-CON M20 20 MEQ tablet TAKE ONE TABLET BY MOUTH ONCE DAILY 60 tablet 7  . losartan (COZAAR) 100 MG tablet Take 1 tablet (100 mg total) by mouth daily. 90 tablet 4  . metoprolol (LOPRESSOR) 50 MG tablet Take 1 tablet (50 mg total) by mouth 2 (two) times daily. 180 tablet 4  . Multiple Vitamin (MULTIVITAMIN WITH MINERALS) TABS tablet Take 1 tablet by mouth daily.    . nitroGLYCERIN (NITROSTAT) 0.4 MG SL tablet Place 1 tablet (0.4 mg total) under the tongue every 5 (five) minutes x 3 doses as needed for chest pain. 25 tablet 3  . RESVERATROL PO Take 1 tablet by mouth daily.    Marland Kitchen atorvastatin (LIPITOR) 80 MG tablet Take 1 tablet (80 mg total) by mouth daily at 6 PM. (Patient not taking: Reported on 02/28/2017) 90 tablet 4   No facility-administered medications prior to visit.      Allergies:   Brilinta [ticagrelor]; Lipitor [atorvastatin]; Latex; Penicillins; and Tekturna [aliskiren]   Social History   Social History  . Marital status: Widowed  Spouse name: N/A  . Number of children: N/A  . Years of education: N/A   Social History Main Topics  . Smoking status: Never Smoker  . Smokeless tobacco: Never Used  . Alcohol use No  . Drug use: No  . Sexual activity: No   Other Topics Concern  . None   Social History Narrative  . None     Family History:  The patient's family history includes Healthy in her brother and mother; Kidney disease in her father; Prostate cancer in her father.   ROS:   Please see the history of present illness.    Constipation. Had a rash when taking Lipitor.  All other systems reviewed  and are negative.   PHYSICAL EXAM:   VS:  BP (!) 152/94 (BP Location: Left Arm)   Pulse 60   Ht 5' (1.524 m)   Wt 224 lb (101.6 kg)   LMP  (LMP Unknown)   BMI 43.75 kg/m    GEN: Well nourished, well developed, in no acute distress . Obese. HEENT: normal  Neck: no JVD, carotid bruits, or masses Cardiac: RRR; no murmurs, rubs, or gallops,no edema  Respiratory:  clear to auscultation bilaterally, normal work of breathing GI: soft, nontender, nondistended, + BS MS: no deformity or atrophy  Skin: warm and dry, no rash Neuro:  Alert and Oriented x 3, Strength and sensation are intact Psych: euthymic mood, full affect  Wt Readings from Last 3 Encounters:  02/28/17 224 lb (101.6 kg)  01/05/17 232 lb (105.2 kg)  12/23/16 226 lb (102.5 kg)      Studies/Labs Reviewed:   EKG:  EKG  Not performed  Recent Labs: 03/24/2016: Hemoglobin 12.3; Platelets 245; TSH 2.430 10/05/2016: ALT (SGPT) Piccolo, Waived 18 11/18/2016: BUN 8; Creatinine, Ser 0.59; Potassium 4.1; Sodium 139   Lipid Panel    Component Value Date/Time   CHOL 227 (H) 10/05/2016 0916   TRIG 104 10/05/2016 0916   HDL 40 03/24/2016 0953   CHOLHDL 3.8 07/16/2015 0733   VLDL 21 10/05/2016 0916   LDLCALC 131 (H) 03/24/2016 0953    Additional studies/ records that were reviewed today include:  CARDIAC CATH 2017 Coronary Diagrams   Diagnostic Diagram       Post-Intervention Diagram           ASSESSMENT:    1. 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 Essential hypertension   3. Chronic diastolic heart failure (HCC)   4. Old MI (myocardial infarction)   5. Hyperlipidemia, unspecified hyperlipidemia type      PLAN:  In order of problems listed above:  1. Aerobic activity, risk factor modification, continue Plavix. 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 2. Target blood pressure less than 140/90 mmHg. Low salt  diet. 3. No evidence of volume overload. 4. Not addressed 5. LDL and total cholesterol targets are less than 70 and less than 170. She stopped taking Lipitor because of rash/cough. She is now on holistic [place] from [place] including [place]. We will check a lipid panel today and make further recommendations based upon laboratory data.  Overall relatively stable without recurrent angina. Continue dual antiplatelet therapy. Blood pressure is okay. She needs weight reduction and aerobic activity. We need to find a strategy for lipid management if the LDL is significantly above 70.    Medication Adjustments/Labs and Tests Ordered: Current medicines are reviewed at length with the  patient today.  Concerns regarding medicines are outlined above.  Medication changes, Labs and Tests ordered today are listed in the Patient Instructions below. Patient Instructions  Medication Instructions:  None  Labwork: Lipid today  Testing/Procedures: None  Follow-Up: Your physician wants you to follow-up in: 9 months with Dr. Katrinka Blazing. You will receive a reminder letter in the mail two months in advance. If you don't receive a letter, please call our office to schedule the follow-up appointment.    Any Other Special Instructions Will Be Listed Below (If Applicable).  Lab targets:  Total Cholesterol 170 or less, LDL (bad) cholesterol 70 or less. Blood pressure target is 140/90 or less.    If you need a refill on your cardiac medications before your next appointment, please call your pharmacy.      Signed, Lesleigh Noe, MD  02/28/2017 3:40 PM    [facility] [address] [place], [place], [place]  [address] Phone: [phone]; Fax: [phone]

## 2017-02-28 ENCOUNTER — Encounter: Payer: Self-pay | Admitting: Interventional Cardiology

## 2017-02-28 ENCOUNTER — Ambulatory Visit (INDEPENDENT_AMBULATORY_CARE_PROVIDER_SITE_OTHER): Payer: Medicare HMO | Admitting: Interventional Cardiology

## 2017-02-28 ENCOUNTER — Encounter (INDEPENDENT_AMBULATORY_CARE_PROVIDER_SITE_OTHER): Payer: Self-pay

## 2017-02-28 VITALS — BP 152/94 | HR 60 | Ht 60.0 in | Wt 224.0 lb

## 2017-02-28 DIAGNOSIS — E785 Hyperlipidemia, unspecified: Secondary | ICD-10-CM

## 2017-02-28 DIAGNOSIS — I10 Essential (primary) hypertension: Secondary | ICD-10-CM | POA: Diagnosis not present

## 2017-02-28 DIAGNOSIS — I5032 Chronic diastolic (congestive) heart failure: Secondary | ICD-10-CM

## 2017-02-28 DIAGNOSIS — I25118 Atherosclerotic heart disease of native coronary artery with other forms of angina pectoris: Secondary | ICD-10-CM | POA: Diagnosis not present

## 2017-02-28 DIAGNOSIS — I252 Old myocardial infarction: Secondary | ICD-10-CM | POA: Diagnosis not present

## 2017-02-28 NOTE — Patient Instructions (Signed)
Medication Instructions:  None  Labwork: Lipid today  Testing/Procedures: None  Follow-Up: Your physician wants you to follow-up in: 9 months with Dr. Katrinka BlazingSmith. You will receive a reminder letter in the mail two months in advance. If you don't receive a letter, please call our office to schedule the follow-up appointment.    Any Other Special Instructions Will Be Listed Below (If Applicable).  Lab targets:  Total Cholesterol 170 or less, LDL (bad) cholesterol 70 or less. Blood pressure target is 140/90 or less.    If you need a refill on your cardiac medications before your next appointment, please call your pharmacy.

## 2017-03-01 LAB — LIPID PANEL
CHOL/HDL RATIO: 6.3 ratio — AB (ref 0.0–4.4)
Cholesterol, Total: 221 mg/dL — ABNORMAL HIGH (ref 100–199)
HDL: 35 mg/dL — AB (ref >39)
LDL Calculated: 140 mg/dL — ABNORMAL HIGH (ref 0–99)
Triglycerides: 228 mg/dL — ABNORMAL HIGH (ref 0–149)
VLDL Cholesterol Cal: 46 mg/dL — ABNORMAL HIGH (ref 5–40)

## 2017-03-11 ENCOUNTER — Telehealth: Payer: Self-pay | Admitting: *Deleted

## 2017-03-11 ENCOUNTER — Ambulatory Visit (INDEPENDENT_AMBULATORY_CARE_PROVIDER_SITE_OTHER): Payer: Medicare HMO

## 2017-03-11 VITALS — BP 140/82 | HR 62 | Temp 98.4°F | Ht 60.0 in | Wt 228.5 lb

## 2017-03-11 DIAGNOSIS — Z0000 Encounter for general adult medical examination without abnormal findings: Secondary | ICD-10-CM | POA: Diagnosis not present

## 2017-03-11 DIAGNOSIS — E785 Hyperlipidemia, unspecified: Secondary | ICD-10-CM

## 2017-03-11 MED ORDER — ROSUVASTATIN CALCIUM 20 MG PO TABS: 20.0000 mg | ORAL_TABLET | Freq: Every day | ORAL | 3 refills | Status: DC

## 2017-03-11 NOTE — Telephone Encounter (Signed)
-----   Message from Lyn RecordsHenry W Smith, MD sent at 03/01/2017 11:40 AM EDT ----- Let the patient know let her know that the cholesterol is even higher now than on the previous study. I would advise restarting treatment for cholesterol. That is the only thing week due to prevent recurrent difficulty with vessel obstruction. Recommend rosuvastatin 20 mg per day. Repeat liver and lipid panel in 2 months. Notify us if any musculoskeletal or other complaints. A copy will be sent to Steele Sizerrissman, Mark A, MD

## 2017-03-11 NOTE — Telephone Encounter (Signed)
Spoke with pt an went over lab results and recommendations per Dr. Katrinka BlazingSmith.  Pt verbalized understanding and was in agreement with this plan.  Sent prescription to preferred pharmacy.  Pt will come on 9-13 for labs.  Pt appreciative for call.

## 2017-03-11 NOTE — Progress Notes (Signed)
Subjective:   Jennifer Grant is a 66 y.o. female who presents for Medicare Annual (Subsequent) preventive examination.  Review of Systems:   Cardiac Risk Factors include: dyslipidemia;hypertension;obesity (BMI >30kg/m2);advanced age (>8755men, 59>65 women)     Objective:     Vitals: BP 140/82 (BP Location: Left Arm, Patient Position: Sitting)   Pulse 62   Temp 98.4 F (36.9 C)   Ht 5' (1.524 m)   Wt 228 lb 8 oz (103.6 kg)   LMP  (LMP Unknown)   BMI 44.63 kg/m   Body mass index is 44.63 kg/m.   Tobacco History  Smoking Status  . Never Smoker  Smokeless Tobacco  . Never Used     Counseling given: Not Answered   Past Medical History:  Diagnosis Date  . Anginal pain (HCC)   . 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    "got booster shot a few times"  . GERD (gastroesophageal reflux disease)   . Hyperlipidemia   . Menopausal state   . Mitral valve disorder   . Myocardial infarction [facility](HCC) 2004; 06/06/2015   Past Surgical History:  Procedure Laterality Date  . CARDIAC CATHETERIZATION N/A 06/08/2015   Procedure: Left Heart Cath and Coronary Angiography;  Surgeon: Lyn RecordsHenry W Smith, MD;  [facility];  Service: Cardiovascular;  Laterality: N/A;  . CARDIAC CATHETERIZATION N/A 06/08/2015   Procedure: Coronary Stent Intervention;  Surgeon: Lyn RecordsHenry W Smith, MD;  [facility];  Service: Cardiovascular;  Laterality: N/A;  . CARDIAC CATHETERIZATION N/A 07/16/2015   Procedure: Coronary Stent Intervention;  Surgeon: Lyn RecordsHenry W Smith, MD;  [facility];  Service: Cardiovascular;  Laterality: N/A;  . CORONARY ANGIOPLASTY WITH STENT PLACEMENT     "6 stents before 05/2015"  . KNEE ARTHROSCOPY Left 1990's  . TUBAL LIGATION  ~ 1978   Family History  Problem Relation Age of Onset  . Healthy Mother   . Prostate cancer Father   . Kidney disease Father   . Healthy Brother   . Cancer Neg Hx   . Diabetes Neg Hx   . Heart disease Neg Hx    History  Sexual Activity  .  Sexual activity: No    Outpatient Encounter Prescriptions as of 03/11/2017  Medication Sig  . amLODipine (NORVASC) 10 MG tablet Take 1 tablet (10 mg total) by mouth daily.  . clopidogrel (PLAVIX) 75 MG tablet Take 1 tablet (75 mg total) by mouth daily.  . isosorbide mononitrate (IMDUR) 60 MG 24 hr tablet TAKE ONE TABLET BY MOUTH ONCE DAILY  . KLOR-CON M20 20 MEQ tablet TAKE ONE TABLET BY MOUTH ONCE DAILY  . losartan (COZAAR) 100 MG tablet Take 1 tablet (100 mg total) by mouth daily.  . metoprolol (LOPRESSOR) 50 MG tablet Take 1 tablet (50 mg total) by mouth 2 (two) times daily.  . Multiple Vitamin (MULTIVITAMIN WITH MINERALS) TABS tablet Take 1 tablet by mouth daily.  Marland Kitchen. RESVERATROL PO Take 1 tablet by mouth daily.  . rosuvastatin (CRESTOR) 20 MG tablet Take 1 tablet (20 mg total) by mouth daily.  . nitroGLYCERIN (NITROSTAT) 0.4 MG SL tablet Place 1 tablet (0.4 mg total) under the tongue every 5 (five) minutes x 3 doses as needed for chest pain. (Patient not taking: Reported on 03/11/2017)   No facility-administered encounter medications on file as of 03/11/2017.     Activities of Daily Living In your present state of health, do you have any difficulty performing the following activities: 03/11/2017 10/05/2016  Hearing? N N  Vision? N N  Difficulty concentrating or making decisions? N N  Walking or climbing stairs? N N  Dressing or bathing? N N  Doing errands, shopping? N N  Preparing Food and eating ? N -  Using the Toilet? N -  In the past six months, have you accidently leaked urine? N -  Do you have problems with loss of bowel control? N -  Managing your Medications? N -  Managing your Finances? N -  Housekeeping or managing your Housekeeping? N -  Some recent data might be hidden    Patient Care Team: Steele Sizer, MD as PCP - General (Family Medicine) Lyn Records, MD as Consulting Physician (Cardiology)    Assessment:     Exercise Activities and Dietary  recommendations Current Exercise Habits: The patient does not participate in regular exercise at present  Goals    . Increase water intake          Recommend drinking at least 4-5 glasses of water a day      Fall Risk Fall Risk  03/11/2017 12/23/2016 11/18/2016 10/07/2016 10/05/2016  Falls in the past year? No Yes Yes Yes Yes  Number falls in past yr: - 1 1 1 1   Injury with Fall? - No No No No  Follow up - Falls evaluation completed - - -   Depression Screen PHQ 2/9 Scores 03/11/2017 10/05/2016 03/24/2016 07/23/2015  PHQ - 2 Score 0 0 0 0     Cognitive Function     6CIT Screen 03/11/2017  What Year? 0 points  What month? 0 points  What time? 0 points  Count back from 20 0 points  Months in reverse 0 points  Repeat phrase 0 points  Total Score 0    Immunization History  Administered Date(s) Administered  . Influenza,inj,Quad PF,36+ Mos 06/18/2015  . Influenza-Unspecified 08/12/2014  . Pneumococcal Conjugate-13 08/12/2014  . Pneumococcal Polysaccharide-23 03/04/2010, 03/24/2016  . Td 08/30/2004  . Tdap 03/24/2016   Screening Tests Health Maintenance  Topic Date Due  . INFLUENZA VACCINE  10/12/2017 (Originally 03/30/2017)  . MAMMOGRAM  01/12/2019  . COLONOSCOPY  07/19/2019  . TETANUS/TDAP  03/24/2026  . DEXA SCAN  Completed  . Hepatitis C Screening  Completed  . PNA vac Low Risk Adult  Completed      Plan:  I have personally reviewed and addressed the Medicare Annual Wellness questionnaire and have noted the following in the patient's chart:  A. Medical and social history B. Use of alcohol, tobacco or illicit drugs  C. Current medications and supplements D. Functional ability and status E.  Nutritional status F.  Physical activity G. Advance directives H. List of other physicians I.  Hospitalizations, surgeries, and ER visits in previous 12 months J.  Vitals K. Screenings such as hearing and vision if needed, cognitive and depression L. Referrals and appointments     In addition, I have reviewed and discussed with patient certain preventive protocols, quality metrics, and best practice recommendations. A written personalized care plan for preventive services as well as general preventive health recommendations were provided to patient.   Signed,  Marin Roberts, LPN Nurse Health Advisor   MD Recommendations: none

## 2017-03-11 NOTE — Patient Instructions (Signed)
[name] , Thank you for taking time to come for your Medicare Wellness Visit. I appreciate your ongoing commitment to your health goals. Please review the following plan we discussed and let me know if I can assist you in the future.   Screening recommendations/referrals: Colonoscopy: Completed 07/18/2014 Mammogram: Completed 01/12/2017 Bone Density: Completed 11/26/2010 Recommended yearly ophthalmology/optometry visit for glaucoma screening and checkup Recommended yearly dental visit for hygiene and checkup  Vaccinations: Influenza vaccine: up to date, due 04/2017 Pneumococcal vaccine: up to date Tdap vaccine: up to date  Shingles vaccine: due, check with your insurance company for coverage  Advanced directives: Advance directive discussed with you today. I have provided a copy for you to complete at home and have notarized. Once this is complete please bring a copy in to our office so we can scan it into your chart.  Conditions/risks identified: Recommend drinking at least 4-5 glasses of water a day   Next appointment: Follow up on 03/21/2017 at 3:00pm with Dr.Crissman, Follow up in one year for your annual wellness exam.   Preventive Care 65 Years and Older, Female Preventive care refers to lifestyle choices and visits with your health care provider that can promote health and wellness. What does preventive care include?  A yearly physical exam. This is also called an annual well check.  Dental exams once or twice a year.  Routine eye exams. Ask your health care provider how often you should have your eyes checked.  Personal lifestyle choices, including:  Daily care of your teeth and gums.  Regular physical activity.  Eating a healthy diet.  Avoiding tobacco and drug use.  Limiting alcohol use.  Practicing safe sex.  Taking low-dose aspirin every day.  Taking vitamin and mineral supplements as recommended by your health care provider. What happens during an annual  well check? The services and screenings done by your health care provider during your annual well check will depend on your age, overall health, lifestyle risk factors, and family history of disease. Counseling  Your health care provider may ask you questions about your:  Alcohol use.  Tobacco use.  Drug use.  Emotional well-being.  Home and relationship well-being.  Sexual activity.  Eating habits.  History of falls.  Memory and ability to understand (cognition).  Work and work Astronomerenvironment.  Reproductive health. Screening  You may have the following tests or measurements:  Height, weight, and BMI.  Blood pressure.  Lipid and cholesterol levels. These may be checked every 5 years, or more frequently if you are over 68 years old.  Skin check.  Lung cancer screening. You may have this screening every year starting at age 655 if you have a 30-pack-year history of smoking and currently smoke or have quit within the past 15 years.  Fecal occult blood test (FOBT) of the stool. You may have this test every year starting at age 68.  Flexible sigmoidoscopy or colonoscopy. You may have a sigmoidoscopy every 5 years or a colonoscopy every 10 years starting at age 68.  Hepatitis C blood test.  Hepatitis B blood test.  Sexually transmitted disease (STD) testing.  Diabetes screening. This is done by checking your blood sugar (glucose) after you have not eaten for a while (fasting). You may have this done every 1-3 years.  Bone density scan. This is done to screen for osteoporosis. You may have this done starting at age 68.  Mammogram. This may be done every 1-2 years. Talk to your health care provider  about how often you should have regular mammograms. Talk with your health care provider about your test results, treatment options, and if necessary, the need for more tests. Vaccines  Your health care provider may recommend certain vaccines, such as:  Influenza vaccine. This  is recommended every year.  Tetanus, diphtheria, and acellular pertussis (Tdap, Td) vaccine. You may need a Td booster every 10 years.  Zoster vaccine. You may need this after age 66.  Pneumococcal 13-valent conjugate (PCV13) vaccine. One dose is recommended after age 80.  Pneumococcal polysaccharide (PPSV23) vaccine. One dose is recommended after age 23. Talk to your health care provider about which screenings and vaccines you need and how often you need them. This information is not intended to replace advice given to you by your health care provider. Make sure you discuss any questions you have with your health care provider. Document Released: 09/12/2015 Document Revised: 05/05/2016 Document Reviewed: 06/17/2015 Elsevier Interactive Patient Education  2017 [employer] Prevention in the Home Falls can cause injuries. They can happen to people of all ages. There are many things you can do to make your home safe and to help prevent falls. What can I do on the outside of my home?  Regularly fix the edges of walkways and driveways and fix any cracks.  Remove anything that might make you trip as you walk through a door, such as a raised step or threshold.  Trim any bushes or trees on the path to your home.  Use bright outdoor lighting.  Clear any walking paths of anything that might make someone trip, such as rocks or tools.  Regularly check to see if handrails are loose or broken. Make sure that both sides of any steps have handrails.  Any raised decks and porches should have guardrails on the edges.  Have any leaves, snow, or ice cleared regularly.  Use sand or salt on walking paths during winter.  Clean up any spills in your garage right away. This includes oil or grease spills. What can I do in the bathroom?  Use night lights.  Install grab bars by the toilet and in the tub and shower. Do not use towel bars as grab bars.  Use non-skid mats or decals in the tub or  shower.  If you need to sit down in the shower, use a plastic, non-slip stool.  Keep the floor dry. Clean up any water that spills on the floor as soon as it happens.  Remove soap buildup in the tub or shower regularly.  Attach bath mats securely with double-sided non-slip rug tape.  Do not have throw rugs and other things on the floor that can make you trip. What can I do in the bedroom?  Use night lights.  Make sure that you have a light by your bed that is easy to reach.  Do not use any sheets or blankets that are too big for your bed. They should not hang down onto the floor.  Have a firm chair that has side arms. You can use this for support while you get dressed.  Do not have throw rugs and other things on the floor that can make you trip. What can I do in the kitchen?  Clean up any spills right away.  Avoid walking on wet floors.  Keep items that you use a lot in easy-to-reach places.  If you need to reach something above you, use a strong step stool that has a grab bar.  Keep electrical cords out of the way.  Do not use floor polish or wax that makes floors slippery. If you must use wax, use non-skid floor wax.  Do not have throw rugs and other things on the floor that can make you trip. What can I do with my stairs?  Do not leave any items on the stairs.  Make sure that there are handrails on both sides of the stairs and use them. Fix handrails that are broken or loose. Make sure that handrails are as long as the stairways.  Check any carpeting to make sure that it is firmly attached to the stairs. Fix any carpet that is loose or worn.  Avoid having throw rugs at the top or bottom of the stairs. If you do have throw rugs, attach them to the floor with carpet tape.  Make sure that you have a light switch at the top of the stairs and the bottom of the stairs. If you do not have them, ask someone to add them for you. What else can I do to help prevent  falls?  Wear shoes that:  Do not have high heels.  Have rubber bottoms.  Are comfortable and fit you well.  Are closed at the toe. Do not wear sandals.  If you use a stepladder:  Make sure that it is fully opened. Do not climb a closed stepladder.  Make sure that both sides of the stepladder are locked into place.  Ask someone to hold it for you, if possible.  Clearly mark and make sure that you can see:  Any grab bars or handrails.  First and last steps.  Where the edge of each step is.  Use tools that help you move around (mobility aids) if they are needed. These include:  Canes.  Walkers.  Scooters.  Crutches.  Turn on the lights when you go into a dark area. Replace any light bulbs as soon as they burn out.  Set up your furniture so you have a clear path. Avoid moving your furniture around.  If any of your floors are uneven, fix them.  If there are any pets around you, be aware of where they are.  Review your medicines with your doctor. Some medicines can make you feel dizzy. This can increase your chance of falling. Ask your doctor what other things that you can do to help prevent falls. This information is not intended to replace advice given to you by your health care provider. Make sure you discuss any questions you have with your health care provider. Document Released: 06/12/2009 Document Revised: 01/22/2016 Document Reviewed: 09/20/2014 Elsevier Interactive Patient Education  2017 [employer].

## 2017-03-21 ENCOUNTER — Encounter: Payer: Self-pay | Admitting: Family Medicine

## 2017-03-21 ENCOUNTER — Ambulatory Visit (INDEPENDENT_AMBULATORY_CARE_PROVIDER_SITE_OTHER): Payer: Medicare HMO | Admitting: Family Medicine

## 2017-03-21 VITALS — BP 166/98 | HR 64 | Wt 226.0 lb

## 2017-03-21 DIAGNOSIS — E785 Hyperlipidemia, unspecified: Secondary | ICD-10-CM | POA: Diagnosis not present

## 2017-03-21 DIAGNOSIS — Z131 Encounter for screening for diabetes mellitus: Secondary | ICD-10-CM

## 2017-03-21 DIAGNOSIS — T82855A Stenosis of coronary artery stent, initial encounter: Secondary | ICD-10-CM | POA: Diagnosis not present

## 2017-03-21 DIAGNOSIS — Z1329 Encounter for screening for other suspected endocrine disorder: Secondary | ICD-10-CM | POA: Diagnosis not present

## 2017-03-21 DIAGNOSIS — Z0000 Encounter for general adult medical examination without abnormal findings: Secondary | ICD-10-CM

## 2017-03-21 DIAGNOSIS — I2510 Atherosclerotic heart disease of native coronary artery without angina pectoris: Secondary | ICD-10-CM

## 2017-03-21 DIAGNOSIS — I10 Essential (primary) hypertension: Secondary | ICD-10-CM | POA: Diagnosis not present

## 2017-03-21 MED ORDER — CLOPIDOGREL BISULFATE 75 MG PO TABS: 75.0000 mg | ORAL_TABLET | Freq: Every day | ORAL | 4 refills | Status: DC

## 2017-03-21 MED ORDER — LOSARTAN POTASSIUM 100 MG PO TABS: 100.0000 mg | ORAL_TABLET | Freq: Every day | ORAL | 4 refills | Status: DC

## 2017-03-21 MED ORDER — AMLODIPINE BESYLATE 10 MG PO TABS: 10.0000 mg | ORAL_TABLET | Freq: Every day | ORAL | 4 refills | Status: DC

## 2017-03-21 MED ORDER — NITROGLYCERIN 0.4 MG SL SUBL: 0.4000 mg | SUBLINGUAL_TABLET | SUBLINGUAL | 3 refills | Status: DC | PRN

## 2017-03-21 MED ORDER — METOPROLOL TARTRATE 50 MG PO TABS: 50.0000 mg | ORAL_TABLET | Freq: Two times a day (BID) | ORAL | 4 refills | Status: DC

## 2017-03-21 NOTE — Progress Notes (Signed)
BP (!) 166/98   Pulse 64   Wt 226 lb (102.5 kg)   LMP  (LMP Unknown)   SpO2 98%   BMI 44.14 kg/m    Subjective:    Patient ID: Jennifer Grant, female    DOB: 11/05/1950, 66 y.o.   MRN: [id number]  HPI: Jennifer LimeValerie E Marsiglia is a 66 y.o. female  Chief Complaint  Patient presents with  . Annual Exam  . Neck Pain  Patient follow-up in cardiology in reviewed notes from July 2 patient was placed on Crestor his been taking that without problems and taken faithfully. Patient's cholesterol prior to that time was terrible and is now taking her medications. Blood pressure patient has been having good blood pressure was good at cardiology previously elevated here today but it's always elevated here. Patient's not having any heart symptoms. Patient's compliance issues patient's taking medications faithfully Patient still eating faithfully.  Relevant past medical, surgical, family and social history reviewed and updated as indicated. Interim medical history since our last visit reviewed. Allergies and medications reviewed and updated.  Review of Systems  Constitutional: Negative.   HENT: Negative.   Eyes: Negative.   Respiratory: Negative.   Cardiovascular: Negative.   Gastrointestinal: Negative.   Endocrine: Negative.   Genitourinary: Negative.   Musculoskeletal: Negative.   Skin: Negative.   Allergic/Immunologic: Negative.   Neurological: Negative.   Hematological: Negative.   Psychiatric/Behavioral: Negative.     Per HPI unless specifically indicated above     Objective:    BP (!) 166/98   Pulse 64   Wt 226 lb (102.5 kg)   LMP  (LMP Unknown)   SpO2 98%   BMI 44.14 kg/m   Wt Readings from Last 3 Encounters:  03/21/17 226 lb (102.5 kg)  03/11/17 228 lb 8 oz (103.6 kg)  02/28/17 224 lb (101.6 kg)    Physical Exam  Constitutional: She is oriented to person, place, and time. She appears well-developed and well-nourished.  HENT:  Head: Normocephalic and atraumatic.    Right Ear: External ear normal.  Left Ear: External ear normal.  Nose: Nose normal.  Mouth/Throat: Oropharynx is clear and moist.  Eyes: Pupils are equal, round, and reactive to light. Conjunctivae and EOM are normal.  Neck: Normal range of motion. Neck supple. Carotid bruit is not present.  Cardiovascular: Normal rate, regular rhythm and normal heart sounds.   No murmur heard. Pulmonary/Chest: Effort normal and breath sounds normal. She exhibits no mass. Right breast exhibits no mass, no skin change and no tenderness. Left breast exhibits no mass, no skin change and no tenderness. Breasts are symmetrical.  Breast exam done with mammography   Abdominal: Soft. Bowel sounds are normal. There is no hepatosplenomegaly.  Musculoskeletal: Normal range of motion.  Neurological: She is alert and oriented to person, place, and time.  Skin: No rash noted.  Psychiatric: She has a normal mood and affect. Her behavior is normal. Judgment and thought content normal.    Results for orders placed or performed in visit on 02/28/17  Lipid panel  Result Value Ref Range   Cholesterol, Total 221 (H) 100 - 199 mg/dL   Triglycerides 409228 (H) 0 - 149 mg/dL   HDL 35 (L) >81>39 mg/dL   VLDL Cholesterol Cal 46 (H) 5 - 40 mg/dL   LDL Calculated 191140 (H) 0 - 99 mg/dL   Chol/HDL Ratio 6.3 (H) 0.0 - 4.4 ratio      Assessment & Plan:   Problem List Items  Addressed This Visit      Cardiovascular and Mediastinum   Coronary artery disease involving native heart (Chronic)    The current medical regimen is effective;  continue present plan and medications.       Relevant Medications   amLODipine (NORVASC) 10 MG tablet   clopidogrel (PLAVIX) 75 MG tablet   losartan (COZAAR) 100 MG tablet   metoprolol tartrate (LOPRESSOR) 50 MG tablet   nitroGLYCERIN (NITROSTAT) 0.4 MG SL tablet   Essential hypertension    Discuss elevated readings here but good elsewhere will observe blood pressure for now recheck in 3 months       Relevant Medications   amLODipine (NORVASC) 10 MG tablet   losartan (COZAAR) 100 MG tablet   metoprolol tartrate (LOPRESSOR) 50 MG tablet   nitroGLYCERIN (NITROSTAT) 0.4 MG SL tablet   Other Relevant Orders   CBC with Differential/Platelet     Other   Hyperlipidemia    Pending labs      Relevant Medications   amLODipine (NORVASC) 10 MG tablet   losartan (COZAAR) 100 MG tablet   metoprolol tartrate (LOPRESSOR) 50 MG tablet   nitroGLYCERIN (NITROSTAT) 0.4 MG SL tablet   Other Relevant Orders   Lipid panel   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

## 2017-03-21 NOTE — Assessment & Plan Note (Signed)
Discussed pt will take meds

## 2017-03-21 NOTE — Assessment & Plan Note (Signed)
The current medical regimen is effective;  continue present plan and medications.  

## 2017-03-21 NOTE — Assessment & Plan Note (Signed)
Discuss elevated readings here but good elsewhere will observe blood pressure for now recheck in 3 months

## 2017-03-21 NOTE — Assessment & Plan Note (Signed)
Pending labs

## 2017-03-22 ENCOUNTER — Encounter: Payer: Self-pay | Admitting: Family Medicine

## 2017-03-22 LAB — CBC WITH DIFFERENTIAL/PLATELET
BASOS: 1 %
Basophils Absolute: 0 10*3/uL (ref 0.0–0.2)
EOS (ABSOLUTE): 0.2 10*3/uL (ref 0.0–0.4)
EOS: 3 %
HEMATOCRIT: 39.3 % (ref 34.0–46.6)
Hemoglobin: 12.5 g/dL (ref 11.1–15.9)
IMMATURE GRANULOCYTES: 0 %
Immature Grans (Abs): 0 10*3/uL (ref 0.0–0.1)
LYMPHS ABS: 3.1 10*3/uL (ref 0.7–3.1)
Lymphs: 50 %
MCH: 26 pg — ABNORMAL LOW (ref 26.6–33.0)
MCHC: 31.8 g/dL (ref 31.5–35.7)
MCV: 82 fL (ref 79–97)
MONOS ABS: 0.3 10*3/uL (ref 0.1–0.9)
Monocytes: 5 %
NEUTROS PCT: 41 %
Neutrophils Absolute: 2.5 10*3/uL (ref 1.4–7.0)
Platelets: 238 10*3/uL (ref 150–379)
RBC: 4.8 x10E6/uL (ref 3.77–5.28)
RDW: 16.4 % — AB (ref 12.3–15.4)
WBC: 6.2 10*3/uL (ref 3.4–10.8)

## 2017-03-22 LAB — TSH: TSH: 2.58 u[IU]/mL (ref 0.450–4.500)

## 2017-03-22 LAB — COMPREHENSIVE METABOLIC PANEL
A/G RATIO: 1.1 — AB (ref 1.2–2.2)
ALT: 15 IU/L (ref 0–32)
AST: 19 IU/L (ref 0–40)
Albumin: 4 g/dL (ref 3.6–4.8)
Alkaline Phosphatase: 60 IU/L (ref 39–117)
BUN / CREAT RATIO: 14 (ref 12–28)
BUN: 11 mg/dL (ref 8–27)
Bilirubin Total: 0.5 mg/dL (ref 0.0–1.2)
CALCIUM: 9.5 mg/dL (ref 8.7–10.3)
CO2: 26 mmol/L (ref 20–29)
Chloride: 101 mmol/L (ref 96–106)
Creatinine, Ser: 0.76 mg/dL (ref 0.57–1.00)
GFR, EST AFRICAN AMERICAN: 93 mL/min/{1.73_m2} (ref >59)
GFR, EST NON AFRICAN AMERICAN: 81 mL/min/{1.73_m2} (ref >59)
GLOBULIN, TOTAL: 3.5 g/dL (ref 1.5–4.5)
Glucose: 76 mg/dL (ref 65–99)
POTASSIUM: 3.9 mmol/L (ref 3.5–5.2)
SODIUM: 140 mmol/L (ref 134–144)
Total Protein: 7.5 g/dL (ref 6.0–8.5)

## 2017-03-22 LAB — LIPID PANEL
Chol/HDL Ratio: 3.9 ratio (ref 0.0–4.4)
Cholesterol, Total: 144 mg/dL (ref 100–199)
HDL: 37 mg/dL — ABNORMAL LOW
LDL Calculated: 94 mg/dL (ref 0–99)
Triglycerides: 64 mg/dL (ref 0–149)
VLDL Cholesterol Cal: 13 mg/dL (ref 5–40)

## 2017-03-24 LAB — URINALYSIS, ROUTINE W REFLEX MICROSCOPIC
BILIRUBIN UA: NEGATIVE
GLUCOSE, UA: NEGATIVE
Ketones, UA: NEGATIVE
Nitrite, UA: NEGATIVE
PROTEIN UA: NEGATIVE
RBC UA: NEGATIVE
SPEC GRAV UA: 1.005 (ref 1.005–1.030)
Urobilinogen, Ur: 0.2 mg/dL (ref 0.2–1.0)
pH, UA: 5.5 (ref 5.0–7.5)

## 2017-03-24 LAB — MICROSCOPIC EXAMINATION

## 2017-05-09 ENCOUNTER — Telehealth: Payer: Self-pay | Admitting: Family Medicine

## 2017-05-09 ENCOUNTER — Other Ambulatory Visit: Payer: Self-pay | Admitting: Family Medicine

## 2017-05-09 DIAGNOSIS — I25118 Atherosclerotic heart disease of native coronary artery with other forms of angina pectoris: Secondary | ICD-10-CM

## 2017-05-09 DIAGNOSIS — I5033 Acute on chronic diastolic (congestive) heart failure: Secondary | ICD-10-CM

## 2017-05-09 DIAGNOSIS — E785 Hyperlipidemia, unspecified: Secondary | ICD-10-CM

## 2017-05-09 DIAGNOSIS — I5032 Chronic diastolic (congestive) heart failure: Secondary | ICD-10-CM

## 2017-05-09 DIAGNOSIS — Z748 Other problems related to care provider dependency: Secondary | ICD-10-CM

## 2017-05-09 NOTE — Telephone Encounter (Signed)
Referral placed to [facility] to see if pt can get transportation help

## 2017-05-11 ENCOUNTER — Telehealth: Payer: Self-pay | Admitting: Interventional Cardiology

## 2017-05-11 NOTE — Telephone Encounter (Signed)
Called pt she is going to call and cancel cardiology appt for tomorrow due to transportation issues. Needs new referral to somewhere in [place]. T'd up please sign.

## 2017-05-11 NOTE — Telephone Encounter (Signed)
Please see message from pt.

## 2017-05-11 NOTE — Telephone Encounter (Signed)
Patient would like to come to [place] office due to transportation issues. Is this ok with  Both of you to change providers?

## 2017-05-11 NOTE — Telephone Encounter (Signed)
Patient can call and cancel appointment and Dr. Dossie Arbourrissman can enter in new referral for cardiology, [facility]

## 2017-05-12 ENCOUNTER — Other Ambulatory Visit: Payer: Medicare HMO

## 2017-05-12 NOTE — Telephone Encounter (Signed)
Fine with me. Thanks

## 2017-05-12 NOTE — Telephone Encounter (Signed)
I am fine with that if that is what Ms. Malen GauzeFoster prefers.  Yvonne Kendallhristopher Zelda Reames, MD [facility] Pager: [phone]

## 2017-05-30 ENCOUNTER — Encounter: Payer: Self-pay | Admitting: Internal Medicine

## 2017-06-08 NOTE — Telephone Encounter (Signed)
lmov to schedule appt  °

## 2017-06-14 ENCOUNTER — Encounter: Payer: Self-pay | Admitting: Family Medicine

## 2017-06-17 NOTE — Telephone Encounter (Signed)
Attempted to contact for scheduling   Unable to contact phone is busy

## 2017-06-21 ENCOUNTER — Ambulatory Visit: Payer: Medicare HMO | Admitting: Family Medicine

## 2017-06-28 ENCOUNTER — Encounter: Payer: Self-pay | Admitting: Family Medicine

## 2017-06-28 ENCOUNTER — Ambulatory Visit (INDEPENDENT_AMBULATORY_CARE_PROVIDER_SITE_OTHER): Payer: Medicare HMO | Admitting: Family Medicine

## 2017-06-28 VITALS — BP 144/80 | HR 43 | Wt 227.0 lb

## 2017-06-28 DIAGNOSIS — E785 Hyperlipidemia, unspecified: Secondary | ICD-10-CM | POA: Diagnosis not present

## 2017-06-28 DIAGNOSIS — I10 Essential (primary) hypertension: Secondary | ICD-10-CM

## 2017-06-28 DIAGNOSIS — I25118 Atherosclerotic heart disease of native coronary artery with other forms of angina pectoris: Secondary | ICD-10-CM

## 2017-06-28 NOTE — Assessment & Plan Note (Signed)
The current medical regimen is effective;  continue present plan and medications. a 

## 2017-06-28 NOTE — Assessment & Plan Note (Signed)
Poor control with goal LDL 70 or less. Encouraged to take medications faithfully and reevaluate

## 2017-06-28 NOTE — Progress Notes (Signed)
BP (!) 144/80 (BP Location: Left Arm)   Pulse (!) 43   Wt 227 lb (103 kg)   LMP  (LMP Unknown)   SpO2 98%   BMI 44.33 kg/m    Subjective:    Patient ID: Jennifer Grant, female    DOB: February 19, 1951, 66 y.o.   MRN: [id number]  HPI: Jennifer Grant is a 66 y.o. female  Chief Complaint  Patient presents with  . Follow-up   Patient doing well no chest pain chest tightness no cardiac symptoms or issues. Taking blood pressure medicine faithfully without problems. Patient says she is taking cholesterol medicine faithfully but on further review probably not.  Relevant past medical, surgical, family and social history reviewed and updated as indicated. Interim medical history since our last visit reviewed. Allergies and medications reviewed and updated.  Review of Systems  Per HPI unless specifically indicated above     Objective:    BP (!) 144/80 (BP Location: Left Arm)   Pulse (!) 43   Wt 227 lb (103 kg)   LMP  (LMP Unknown)   SpO2 98%   BMI 44.33 kg/m   Wt Readings from Last 3 Encounters:  06/28/17 227 lb (103 kg)  03/21/17 226 lb (102.5 kg)  03/11/17 228 lb 8 oz (103.6 kg)    Physical Exam  Results for orders placed or performed in visit on 03/21/17  Microscopic Examination  Result Value Ref Range   WBC, UA 0-5 0 - 5 /hpf   RBC, UA 0-2 0 - 2 /hpf   Epithelial Cells (non renal) 0-10 0 - 10 /hpf  CBC with Differential/Platelet  Result Value Ref Range   WBC 6.2 3.4 - 10.8 x10E3/uL   RBC 4.80 3.77 - 5.28 x10E6/uL   Hemoglobin 12.5 11.1 - 15.9 g/dL   Hematocrit 40.9 81.1 - 46.6 %   MCV 82 79 - 97 fL   MCH 26.0 (L) 26.6 - 33.0 pg   MCHC 31.8 31.5 - 35.7 g/dL   RDW 91.4 (H) 78.2 - 95.6 %   Platelets 238 150 - 379 x10E3/uL   Neutrophils 41 Not Estab. %   Lymphs 50 Not Estab. %   Monocytes 5 Not Estab. %   Eos 3 Not Estab. %   Basos 1 Not Estab. %   Neutrophils Absolute 2.5 1.4 - 7.0 x10E3/uL   Lymphocytes Absolute 3.1 0.7 - 3.1 x10E3/uL   Monocytes Absolute  0.3 0.1 - 0.9 x10E3/uL   EOS (ABSOLUTE) 0.2 0.0 - 0.4 x10E3/uL   Basophils Absolute 0.0 0.0 - 0.2 x10E3/uL   Immature Granulocytes 0 Not Estab. %   Immature Grans (Abs) 0.0 0.0 - 0.1 x10E3/uL  Comprehensive metabolic panel  Result Value Ref Range   Glucose 76 65 - 99 mg/dL   BUN 11 8 - 27 mg/dL   Creatinine, Ser 2.13 0.57 - 1.00 mg/dL   GFR calc non Af Amer 81 >59 mL/min/1.73   GFR calc Af Amer 93 >59 mL/min/1.73   BUN/Creatinine Ratio 14 12 - 28   Sodium 140 134 - 144 mmol/L   Potassium 3.9 3.5 - 5.2 mmol/L   Chloride 101 96 - 106 mmol/L   CO2 26 20 - 29 mmol/L   Calcium 9.5 8.7 - 10.3 mg/dL   Total Protein 7.5 6.0 - 8.5 g/dL   Albumin 4.0 3.6 - 4.8 g/dL   Globulin, Total 3.5 1.5 - 4.5 g/dL   Albumin/Globulin Ratio 1.1 (L) 1.2 - 2.2   Bilirubin Total 0.5  0.0 - 1.2 mg/dL   Alkaline Phosphatase 60 39 - 117 IU/L   AST 19 0 - 40 IU/L   ALT 15 0 - 32 IU/L  Lipid panel  Result Value Ref Range   Cholesterol, Total 144 100 - 199 mg/dL   Triglycerides 64 0 - 149 mg/dL   HDL 37 (L) >29>39 mg/dL   VLDL Cholesterol Cal 13 5 - 40 mg/dL   LDL Calculated 94 0 - 99 mg/dL   Chol/HDL Ratio 3.9 0.0 - 4.4 ratio  TSH  Result Value Ref Range   TSH 2.580 0.450 - 4.500 uIU/mL  Urinalysis, Routine w reflex microscopic  Result Value Ref Range   Specific Gravity, UA 1.005 1.005 - 1.030   pH, UA 5.5 5.0 - 7.5   Color, UA Yellow Yellow   Appearance Ur Clear Clear   Leukocytes, UA Trace (A) Negative   Protein, UA Negative Negative/Trace   Glucose, UA Negative Negative   Ketones, UA Negative Negative   RBC, UA Negative Negative   Bilirubin, UA Negative Negative   Urobilinogen, Ur 0.2 0.2 - 1.0 mg/dL   Nitrite, UA Negative Negative   Microscopic Examination See below:       Assessment & Plan:   Problem List Items Addressed This Visit      Cardiovascular and Mediastinum   Coronary artery disease involving native heart (Chronic)    The current medical regimen is effective;  continue present  plan and medications. a      Essential hypertension - Primary    The current medical regimen is effective;  continue present plan and medications.       Relevant Orders   Basic metabolic panel   LP+ALT+AST Piccolo, Waived     Other   Hyperlipidemia    Poor control with goal LDL 70 or less. Encouraged to take medications faithfully and reevaluate      Relevant Orders   Basic metabolic panel   LP+ALT+AST Piccolo, Waived       Follow up plan: Return in about 3 months (around 09/28/2017) for BMP,  Lipids, ALT, AST.

## 2017-06-28 NOTE — Assessment & Plan Note (Signed)
The current medical regimen is effective;  continue present plan and medications.  

## 2017-06-29 LAB — BASIC METABOLIC PANEL
BUN / CREAT RATIO: 14 (ref 12–28)
BUN: 9 mg/dL (ref 8–27)
CO2: 25 mmol/L (ref 20–29)
Calcium: 9.8 mg/dL (ref 8.7–10.3)
Chloride: 101 mmol/L (ref 96–106)
Creatinine, Ser: 0.64 mg/dL (ref 0.57–1.00)
GFR calc non Af Amer: 92 mL/min/{1.73_m2} (ref >59)
GFR, EST AFRICAN AMERICAN: 106 mL/min/{1.73_m2} (ref >59)
Glucose: 100 mg/dL — ABNORMAL HIGH (ref 65–99)
POTASSIUM: 3.9 mmol/L (ref 3.5–5.2)
SODIUM: 139 mmol/L (ref 134–144)

## 2017-06-29 LAB — LP+ALT+AST PICCOLO, WAIVED
ALT (SGPT) PICCOLO, WAIVED: 13 U/L (ref 10–47)
AST (SGOT) PICCOLO, WAIVED: 21 U/L (ref 11–38)
CHOL/HDL RATIO PICCOLO,WAIVE: 3.9 mg/dL
CHOLESTEROL PICCOLO, WAIVED: 192 mg/dL (ref <200)
HDL Chol Piccolo, Waived: 50 mg/dL — ABNORMAL LOW (ref >59)
LDL Chol Calc Piccolo Waived: 120 mg/dL — ABNORMAL HIGH (ref <100)
TRIGLYCERIDES PICCOLO,WAIVED: 111 mg/dL (ref <150)
VLDL Chol Calc Piccolo,Waive: 22 mg/dL (ref <30)

## 2017-07-06 NOTE — Telephone Encounter (Signed)
Unable to contact patient for scheduling

## 2017-08-11 ENCOUNTER — Telehealth: Payer: Self-pay | Admitting: Family Medicine

## 2017-08-11 NOTE — Telephone Encounter (Signed)
FYI

## 2017-08-11 NOTE — Telephone Encounter (Signed)
Copied from CRM [id number]. Topic: Referral - Status >> Aug 11, 2017 11:29 AM Jennifer Grant, Rosey Batheresa D wrote: Reason for CRM: Patient called and said the appt she had with her cardiology with Dr. Katrinka BlazingSmith she had to reschedule due to weather and her next appt is in February. She just wanted Dr. Dossie Arbourrissman to know.

## 2017-08-12 ENCOUNTER — Ambulatory Visit: Payer: Medicare HMO | Admitting: Interventional Cardiology

## 2017-09-01 ENCOUNTER — Encounter: Payer: Self-pay | Admitting: Family Medicine

## 2017-09-28 ENCOUNTER — Ambulatory Visit (INDEPENDENT_AMBULATORY_CARE_PROVIDER_SITE_OTHER): Payer: Medicare HMO | Admitting: Family Medicine

## 2017-09-28 ENCOUNTER — Encounter: Payer: Self-pay | Admitting: Family Medicine

## 2017-09-28 ENCOUNTER — Ambulatory Visit: Payer: Medicare HMO | Admitting: Family Medicine

## 2017-09-28 DIAGNOSIS — Z6841 Body Mass Index (BMI) 40.0 and over, adult: Secondary | ICD-10-CM

## 2017-09-28 DIAGNOSIS — I5033 Acute on chronic diastolic (congestive) heart failure: Secondary | ICD-10-CM | POA: Diagnosis not present

## 2017-09-28 DIAGNOSIS — I10 Essential (primary) hypertension: Secondary | ICD-10-CM | POA: Diagnosis not present

## 2017-09-28 DIAGNOSIS — E785 Hyperlipidemia, unspecified: Secondary | ICD-10-CM | POA: Diagnosis not present

## 2017-09-28 MED ORDER — HYDROCHLOROTHIAZIDE 25 MG PO TABS: 25.0000 mg | ORAL_TABLET | Freq: Every day | ORAL | 1 refills | Status: DC

## 2017-09-28 MED ORDER — LOVASTATIN 20 MG PO TABS: 20.0000 mg | ORAL_TABLET | Freq: Every day | ORAL | 1 refills | Status: DC

## 2017-09-28 NOTE — Assessment & Plan Note (Signed)
Discussed weight loss. 

## 2017-09-28 NOTE — Assessment & Plan Note (Signed)
Discuss strain on heart and high blood pressure patient will double down on doing better with her blood pressure medications.

## 2017-09-28 NOTE — Assessment & Plan Note (Signed)
Discuss poor control of blood pressure patient will restart hydrochlorothiazide 25 mg 1 a day and continue other medications.  Reviewed blood pressure risk and benefits of treatment and nontreatment again.

## 2017-09-28 NOTE — Progress Notes (Signed)
BP (!) 166/88   Pulse (!) 47   Wt 227 lb (103 kg)   LMP  (LMP Unknown)   SpO2 98%   BMI 44.33 kg/m    Subjective:    Patient ID: Jennifer Grant, female    DOB: [birth year], 67 y.o.   MRN: [id number]  HPI: Jennifer Grant is a 67 y.o. female  Chief Complaint  Patient presents with  . Follow-up  . Hypertension  Patient also follow-up hypercholesterol unable to take Crestor because of developed a cough.  Cough went away with stopping the Crestor. Taking blood pressure medications without problems or issues with blood pressure always up here and otherwise doing well. No chest pain takes isosorbide without problems.  Relevant past medical, surgical, family and social history reviewed and updated as indicated. Interim medical history since our last visit reviewed. Allergies and medications reviewed and updated.  Review of Systems  Constitutional: Negative.   Respiratory: Negative.   Cardiovascular: Negative.     Per HPI unless specifically indicated above     Objective:    BP (!) 166/88   Pulse (!) 47   Wt 227 lb (103 kg)   LMP  (LMP Unknown)   SpO2 98%   BMI 44.33 kg/m   Wt Readings from Last 3 Encounters:  09/28/17 227 lb (103 kg)  06/28/17 227 lb (103 kg)  03/21/17 226 lb (102.5 kg)    Physical Exam  Constitutional: She is oriented to person, place, and time. She appears well-developed and well-nourished.  HENT:  Head: Normocephalic and atraumatic.  Eyes: Conjunctivae and EOM are normal.  Neck: Normal range of motion.  Cardiovascular: Normal rate, regular rhythm and normal heart sounds.  Pulmonary/Chest: Effort normal and breath sounds normal.  Musculoskeletal: Normal range of motion.  Neurological: She is alert and oriented to person, place, and time.  Skin: No erythema.  Psychiatric: She has a normal mood and affect. Her behavior is normal. Judgment and thought content normal.    Results for orders placed or performed in visit on 06/28/17  Basic  metabolic panel  Result Value Ref Range   Glucose 100 (H) 65 - 99 mg/dL   BUN 9 8 - 27 mg/dL   Creatinine, Ser 4.09 0.57 - 1.00 mg/dL   GFR calc non Af Amer 92 >59 mL/min/1.73   GFR calc Af Amer 106 >59 mL/min/1.73   BUN/Creatinine Ratio 14 12 - 28   Sodium 139 134 - 144 mmol/L   Potassium 3.9 3.5 - 5.2 mmol/L   Chloride 101 96 - 106 mmol/L   CO2 25 20 - 29 mmol/L   Calcium 9.8 8.7 - 10.3 mg/dL  LP+ALT+AST Piccolo, Waived  Result Value Ref Range   ALT (SGPT) Piccolo, Waived 13 10 - 47 U/L   AST (SGOT) Piccolo, Waived 21 11 - 38 U/L   Cholesterol Piccolo, Waived 192 <200 mg/dL   HDL Chol Piccolo, Waived 50 (L) >59 mg/dL   Triglycerides Piccolo,Waived 111 <150 mg/dL   Chol/HDL Ratio Piccolo,Waive 3.9 mg/dL   LDL Chol Calc Piccolo Waived 120 (H) <100 mg/dL   VLDL Chol Calc Piccolo,Waive 22 <30 mg/dL      Assessment & Plan:   Problem List Items Addressed This Visit      Cardiovascular and Mediastinum   Essential hypertension    Discuss poor control of blood pressure patient will restart hydrochlorothiazide 25 mg 1 a day and continue other medications.  Reviewed blood pressure risk and benefits of treatment and nontreatment  again.      Relevant Medications   lovastatin (MEVACOR) 20 MG tablet   hydrochlorothiazide (HYDRODIURIL) 25 MG tablet   Acute on chronic diastolic heart failure (HCC)    Discuss strain on heart and high blood pressure patient will double down on doing better with her blood pressure medications.      Relevant Medications   lovastatin (MEVACOR) 20 MG tablet   hydrochlorothiazide (HYDRODIURIL) 25 MG tablet     Other   Hyperlipidemia    Discussed importance of cholesterol medicines will stop Crestor and begin lovastatin 20 mg      Relevant Medications   lovastatin (MEVACOR) 20 MG tablet   hydrochlorothiazide (HYDRODIURIL) 25 MG tablet   BMI 45.0-49.9, adult (HCC)    Discussed weight loss          Follow up plan: Return in about 2 months (around  11/26/2017) for BMP,  Lipids, ALT, AST.

## 2017-09-28 NOTE — Assessment & Plan Note (Signed)
Discussed importance of cholesterol medicines will stop Crestor and begin lovastatin 20 mg

## 2017-10-07 ENCOUNTER — Ambulatory Visit: Payer: Self-pay

## 2017-10-07 NOTE — Telephone Encounter (Signed)
Patient called, the phone was answered and the gentleman stated "she's not home at the moment." I asked him to have her call the office back on Monday if she still has questions about her medication. He stated "ok, I'll tell her."

## 2017-10-10 NOTE — Progress Notes (Deleted)
Cardiology Office Note    Date:  10/10/2017   ID:  Jennifer Grant, DOB 11/09/1950, MRN [id number]  PCP:  Steele Sizerrissman, Mark A, MD  Cardiologist: Lesleigh NoeHenry W Smith III, MD   No chief complaint on file.   History of Present Illness:  Jennifer Grant is a 67 y.o. female who presents for follow-up of coronary artery disease and recent acute infarction due to stent thrombosis. She has history of hypertension, hyperlipidemia, mitral valve prolapse       Past Medical History:  Diagnosis Date  . Anginal pain (HCC)   . 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    "got booster shot a few times"  . GERD (gastroesophageal reflux disease)   . Hyperlipidemia   . Menopausal state   . Mitral valve disorder   . Myocardial infarction [facility](HCC) 2004; 06/06/2015    Past Surgical History:  Procedure Laterality Date  . CARDIAC CATHETERIZATION N/A 06/08/2015   Procedure: Left Heart Cath and Coronary Angiography;  Surgeon: Lyn RecordsHenry W Smith, MD;  [facility];  Service: Cardiovascular;  Laterality: N/A;  . CARDIAC CATHETERIZATION N/A 06/08/2015   Procedure: Coronary Stent Intervention;  Surgeon: Lyn RecordsHenry W Smith, MD;  [facility];  Service: Cardiovascular;  Laterality: N/A;  . CARDIAC CATHETERIZATION N/A 07/16/2015   Procedure: Coronary Stent Intervention;  Surgeon: Lyn RecordsHenry W Smith, MD;  [facility];  Service: Cardiovascular;  Laterality: N/A;  . CORONARY ANGIOPLASTY WITH STENT PLACEMENT     "6 stents before 05/2015"  . KNEE ARTHROSCOPY Left 1990's  . TUBAL LIGATION  ~ 1978    Current Medications: Outpatient Medications Prior to Visit  Medication Sig Dispense Refill  . amLODipine (NORVASC) 10 MG tablet Take 1 tablet (10 mg total) by mouth daily. 90 tablet 4  . clopidogrel (PLAVIX) 75 MG tablet Take 1 tablet (75 mg total) by mouth daily. 90 tablet 4  . hydrochlorothiazide (HYDRODIURIL) 25 MG tablet Take 1 tablet (25 mg total) by mouth daily. 90 tablet 1  . isosorbide mononitrate (IMDUR)  60 MG 24 hr tablet TAKE ONE TABLET BY MOUTH ONCE DAILY 90 tablet 2  . KLOR-CON M20 20 MEQ tablet TAKE ONE TABLET BY MOUTH ONCE DAILY 60 tablet 7  . losartan (COZAAR) 100 MG tablet Take 1 tablet (100 mg total) by mouth daily. 90 tablet 4  . lovastatin (MEVACOR) 20 MG tablet Take 1 tablet (20 mg total) by mouth at bedtime. 90 tablet 1  . metoprolol tartrate (LOPRESSOR) 50 MG tablet Take 1 tablet (50 mg total) by mouth 2 (two) times daily. 180 tablet 4  . Multiple Vitamin (MULTIVITAMIN WITH MINERALS) TABS tablet Take 1 tablet by mouth daily.    . nitroGLYCERIN (NITROSTAT) 0.4 MG SL tablet Place 1 tablet (0.4 mg total) under the tongue every 5 (five) minutes x 3 doses as needed for chest pain. 25 tablet 3  . RESVERATROL PO Take 1 tablet by mouth daily.     No facility-administered medications prior to visit.      Allergies:   Brilinta [ticagrelor]; Lipitor [atorvastatin]; Latex; Penicillins; and Tekturna [aliskiren]   Social History   Socioeconomic History  . Marital status: Widowed    Spouse name: Not on file  . Number of children: Not on file  . Years of education: Not on file  . Highest education level: Not on file  Social Needs  . Financial resource strain: Not on file  . Food insecurity - worry: Not on file  . Food  insecurity - inability: Not on file  . Transportation needs - medical: Not on file  . Transportation needs - non-medical: Not on file  Occupational History  . Not on file  Tobacco Use  . Smoking status: Never Smoker  . Smokeless tobacco: Never Used  Substance and Sexual Activity  . Alcohol use: No  . Drug use: No  . Sexual activity: No    Birth control/protection: Surgical  Other Topics Concern  . Not on file  Social History Narrative  . Not on file     Family History:  The patient's ***family history includes Healthy in her brother and mother; Kidney disease in her father; Prostate cancer in her father.   ROS:   Please see the history of present illness.      ***  All other systems reviewed and are negative.   PHYSICAL EXAM:   VS:  LMP  (LMP Unknown)    GEN: Well nourished, well developed, in no acute distress  HEENT: normal  Neck: no JVD, carotid bruits, or masses Cardiac: ***RRR; no murmurs, rubs, or gallops,no edema  Respiratory:  clear to auscultation bilaterally, normal work of breathing GI: soft, nontender, nondistended, + BS MS: no deformity or atrophy  Skin: warm and dry, no rash Neuro:  Alert and Oriented x 3, Strength and sensation are intact Psych: euthymic mood, full affect  Wt Readings from Last 3 Encounters:  09/28/17 227 lb (103 kg)  06/28/17 227 lb (103 kg)  03/21/17 226 lb (102.5 kg)      Studies/Labs Reviewed:   EKG:  EKG  ***  Recent Labs: 03/21/2017: Hemoglobin 12.5; Platelets 238; TSH 2.580 06/28/2017: ALT (SGPT) Piccolo, Waived 13; BUN 9; Creatinine, Ser 0.64; Potassium 3.9; Sodium 139   Lipid Panel    Component Value Date/Time   CHOL 192 06/28/2017 1505   TRIG 111 06/28/2017 1505   HDL 37 (L) 03/21/2017 1450   CHOLHDL 3.9 03/21/2017 1450   CHOLHDL 3.8 07/16/2015 0733   VLDL 22 06/28/2017 1505   LDLCALC 94 03/21/2017 1450    Additional studies/ records that were reviewed today include:  ***    ASSESSMENT:    1. Chronic diastolic heart failure (HCC)   2. Coronary artery disease of native artery of native heart with stable angina pectoris (HCC)   3. Essential hypertension   4. 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 Hyperlipidemia, unspecified hyperlipidemia type      PLAN:  In order of problems listed above:  1. ***    Medication Adjustments/Labs and Tests Ordered: Current medicines are reviewed at length with the patient today.  Concerns regarding medicines are outlined above.  Medication changes, Labs and Tests ordered today are listed in the Patient Instructions below. There are no Patient Instructions on file for this visit.   Signed, Lesleigh Noe,  MD  10/10/2017 6:51 PM    [facility] [address] [place], [place], [place]  [address] Phone: [phone]; Fax: [phone]

## 2017-10-11 ENCOUNTER — Ambulatory Visit: Payer: Medicare HMO | Admitting: Interventional Cardiology

## 2017-10-25 ENCOUNTER — Ambulatory Visit (INDEPENDENT_AMBULATORY_CARE_PROVIDER_SITE_OTHER): Payer: Medicare HMO | Admitting: Nurse Practitioner

## 2017-10-25 ENCOUNTER — Encounter: Payer: Self-pay | Admitting: Nurse Practitioner

## 2017-10-25 VITALS — BP 180/100 | HR 67 | Ht 63.0 in | Wt 226.0 lb

## 2017-10-25 DIAGNOSIS — I110 Hypertensive heart disease with heart failure: Secondary | ICD-10-CM | POA: Diagnosis not present

## 2017-10-25 DIAGNOSIS — E785 Hyperlipidemia, unspecified: Secondary | ICD-10-CM | POA: Diagnosis not present

## 2017-10-25 DIAGNOSIS — I5189 Other ill-defined heart diseases: Secondary | ICD-10-CM

## 2017-10-25 DIAGNOSIS — I5032 Chronic diastolic (congestive) heart failure: Secondary | ICD-10-CM

## 2017-10-25 DIAGNOSIS — Z9114 Patient's other noncompliance with medication regimen: Secondary | ICD-10-CM

## 2017-10-25 DIAGNOSIS — I519 Heart disease, unspecified: Secondary | ICD-10-CM | POA: Diagnosis not present

## 2017-10-25 DIAGNOSIS — I2510 Atherosclerotic heart disease of native coronary artery without angina pectoris: Secondary | ICD-10-CM | POA: Diagnosis not present

## 2017-10-25 NOTE — Progress Notes (Signed)
Office Visit    Patient Name: Jennifer Grant Date of Encounter: 10/25/2017  Primary Care Provider:  Steele Sizerrissman, Mark A, MD Primary Cardiologist:  Previously Mendel RyderH. Smith, MD - [place] [facility] and would like to f/u here  Chief Complaint    67 year old female with a history of CAD status post prior stenting, diastolic dysfunction, GERD, hypertensive heart disease, hyperlipidemia, obesity, and noncompliance, who presents for follow-up.  Past Medical History    Past Medical History:  Diagnosis Date  . CAD (coronary artery disease)    a. 2004 s/p MI w/ PCI/stenting to the LCX, OM1, OM2, RCA, RPDA; b. 05/2015 MI/PCI: 100 OM1 (2.75x20 & 3.0x8 Promus Premier DES'), PTCA OM2; c. 07/2015 Cath/Staged PCI: LM 60, LAD 75d, LCX 4145m ISR, OM1 ok, OM2 25 ISR, RCA 50p, 3154m/d, 85d (3.0x28 Promus Premier DES).  . Diastolic dysfunction    a. 05/2015 Echo: EF 50-55%, severe lateral HK. Gr1 DD, mildly dil LA. Mildly reduced RV fxn.  . 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    "got booster shot a few times"  . GERD (gastroesophageal reflux disease)   . Hyperlipidemia   . Hypertensive heart disease   . Menopausal state   . Morbid obesity (HCC)   . Noncompliance    Past Surgical History:  Procedure Laterality Date  . CARDIAC CATHETERIZATION N/A 06/08/2015   Procedure: Left Heart Cath and Coronary Angiography;  Surgeon: Lyn RecordsHenry W Smith, MD;  [facility];  Service: Cardiovascular;  Laterality: N/A;  . CARDIAC CATHETERIZATION N/A 06/08/2015   Procedure: Coronary Stent Intervention;  Surgeon: Lyn RecordsHenry W Smith, MD;  [facility];  Service: Cardiovascular;  Laterality: N/A;  . CARDIAC CATHETERIZATION N/A 07/16/2015   Procedure: Coronary Stent Intervention;  Surgeon: Lyn RecordsHenry W Smith, MD;  [facility];  Service: Cardiovascular;  Laterality: N/A;  . CORONARY ANGIOPLASTY WITH STENT PLACEMENT     "6 stents before 05/2015"  . KNEE ARTHROSCOPY Left 1990's  . TUBAL LIGATION  ~ 1978      Allergies  Allergies  Allergen Reactions  . Brilinta [Ticagrelor] Shortness Of Breath  . Lipitor [Atorvastatin] Itching    Mouth itching, cough  . Latex Other (See Comments)    Gloves make hands look black after prolonged use  . Penicillins Itching and Swelling    Tongue itching and lip swelling Has patient had a PCN reaction causing immediate rash, facial/tongue/throat swelling, SOB or lightheadedness with hypotension: yes Has patient had a PCN reaction causing severe rash involving mucus membranes or skin necrosis: No Has patient had a PCN reaction that required hospitalization No Has patient had a PCN reaction occurring within the last 10 years: No If all of the above answers are "NO", then may proceed with Cephalosporin use.  Danie Chandler. Tekturna [Aliskiren] Rash    History of Present Illness    67 year old female with the above complex past medical history including CAD status post prior MI in 2004 with subsequent PCI and stenting of the left circumflex, OM1, OM 2, RCA, and RPDA.  More recently, she has been followed by Dr. Katrinka BlazingSmith in [place] in October 2016, she suffered recurrent myocardial infarction in the setting of noncompliance with medications and required PCI and drug-eluting stent placement to the totally occluded first obtuse marginal along with PTCA of the second obtuse marginal.  She had residual distal RCA disease and she underwent staged PCI and drug-eluting stent placement in November 2016.  Echocardiogram at that time showed normal  LV function.  Other history includes hypertension, hyperlipidemia, obesity, GERD, and noncompliance.  She admits to frequently skipping medications for no particular reason.  She misses some pills most days of the week.  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  Despite her noncompliance, she is able to perform the duties of her job without any significant limitations.  She denies any chest pain, dyspnea, palpitations, PND, orthopnea,  dizziness, syncope, edema, or early satiety.  Upon arrival today, her blood pressure was 215/115.  She had not yet taken her morning medicines.  Upon hearing this, our medical assistant says that she opened all of her pill bottles, took 1 tablet from each, and took all of those pills at once.  She was recently placed on hydrochlorothiazide however, she says she felt like it made her face tingle and so she stopped it after just 1 dose.  Home Medications    Prior to Admission medications   Medication Sig Start Date End Date Taking? Authorizing Provider  amLODipine (NORVASC) 10 MG tablet Take 1 tablet (10 mg total) by mouth daily. 03/21/17  Yes Crissman, Redge Gainer, MD  clopidogrel (PLAVIX) 75 MG tablet Take 1 tablet (75 mg total) by mouth daily. 03/21/17  Yes Crissman, Redge Gainer, MD  isosorbide mononitrate (IMDUR) 60 MG 24 hr tablet TAKE ONE TABLET BY MOUTH ONCE DAILY 09/03/16  Yes Azalee Course, PA  KLOR-CON M20 20 MEQ tablet TAKE ONE TABLET BY MOUTH ONCE DAILY 09/03/16  Yes Lyn Records, MD  losartan (COZAAR) 100 MG tablet Take 1 tablet (100 mg total) by mouth daily. 03/21/17  Yes Crissman, Redge Gainer, MD  lovastatin (MEVACOR) 20 MG tablet Take 1 tablet (20 mg total) by mouth at bedtime. 09/28/17  Yes Crissman, Redge Gainer, MD  metoprolol tartrate (LOPRESSOR) 50 MG tablet Take 1 tablet (50 mg total) by mouth 2 (two) times daily. 03/21/17  Yes Crissman, Redge Gainer, MD  nitroGLYCERIN (NITROSTAT) 0.4 MG SL tablet Place 1 tablet (0.4 mg total) under the tongue every 5 (five) minutes x 3 doses as needed for chest pain. 03/21/17  Yes Crissman, Redge Gainer, MD    Review of Systems    Patient has been doing well from a cardiac standpoint and denies chest pain, palpitations, dyspnea, PND, orthopnea, dizziness, syncope, edema, or early satiety.  She admits to noncompliance with medications.  All other systems reviewed and are otherwise negative except as noted above.  Physical Exam    VS: Blood pressure initially 215/115.  Patient had not  taken her medicines yet today.  After hearing her blood pressure was elevated, she took all of her daily medications while the medical assistant was still in the room with her. BP (!) 180/100 (BP Location: Left Arm, Patient Position: Sitting, Cuff Size: Large)   Pulse 67   Ht 5\' 3"  (1.6 m)   Wt 226 lb (102.5 kg)   LMP  (LMP Unknown)   BMI 40.03 kg/m  , BMI Body mass index is 40.03 kg/m. GEN: Obese, in no acute distress.  HEENT: normal.  Neck: Supple, obese, difficult to gauge JVP, no carotid bruits, or masses. Cardiac: RRR, no murmurs, rubs, or gallops. No clubbing, cyanosis, edema.  Radials/DP/PT 2+ and equal bilaterally.  Respiratory:  Respirations regular and unlabored, clear to auscultation bilaterally. GI: Soft, nontender, nondistended, BS + x 4. MS: no deformity or atrophy. Skin: warm and dry, no rash. Neuro:  Strength and sensation are intact. Psych: Normal affect.  Accessory Clinical Findings  ECG -regular sinus rhythm, 67, left axis deviation, left anterior fascicular block, probable left atrial enlargement, nonspecific T changes.  Assessment & Plan    1.  Hypertensive heart disease: Patient admits to noncompliance with medications and prior to coming today, she had not yet taken her medicines.  She took all of them all at once while sitting in our exam room at approximately 1:45 this afternoon.  Pressure was initially 215/115.  She is completely asymptomatic.  Follow-up blood pressure 180/100.  I noted that her blood pressure was 166/88 when seen by primary care on January 30.  Patient has a cuff at home but does not use it.  Given her noncompliance and that she has just taken all of her morning medicines now, it is hard to really understand how her blood pressure trends at home.  She is prescribed amlodipine 10 mg, Imdur 60 mg daily, losartan 100 mg daily, metoprolol 50 mg twice daily, and was also recently prescribed HCTZ 25 mg daily.  She has not been taking the HCTZ because  she says it made her face tingle.  I have stressed the importance of compliance with medications and have asked her to follow-up her blood pressure at home today and also follow-up blood pressure daily and record.  We will see her back for a blood pressure check/nurse visit in a week.  I suspect she might benefit from switching from metoprolol to carvedilol or labetalol,  Though with compliance, blood pressure may be reasonably controlled.   2.  Coronary artery disease: Status post prior extensive stenting with restenosis in 2016 requiring more stenting of the OM 2 and RCA.  She has not been having any chest pain or dyspnea and remains relatively active, working as a Agricultural engineer.  She does not take aspirin because it upsets her stomach but does tolerate Plavix.  She is also on beta-blocker, ARB, statin and nitrate.    3.  Hyperlipidemia: She had previous been prescribed Crestor but she stopped this on her own because of concern about a cough.  She is now on lovastatin though as above, compliance is sketchy.  4.  Diastolic dysfunction: Despite elevated blood pressures noncompliance, volume actually appears pretty good.  We will work on blood pressure as above though this is largely dependent upon her ability to be compliant.  5.  Medication nonadherence: Stressed the importance of compliance and risks related to uncontrolled blood pressures and being off of antiplatelet therapy.  6.  Disposition: Follow-up blood pressure check in 1 week, at which time we can consider medication adjustment.  Follow-up in the office within the next month.  Nicolasa Ducking, NP 10/25/2017, 5:06 PM

## 2017-10-25 NOTE — Patient Instructions (Signed)
Medication Instructions:  Your physician recommends that you continue on your current medications as directed. Please refer to the Current Medication list given to you today.   Labwork: none  Testing/Procedures: nond  Follow-Up: Your physician recommends that you schedule a follow-up appointment in: 1 WEEK FOR NURSE VISIT TO CHECK YOUR BLOOD PRESSURE.  Please take your blood pressure medications in the morning before your appointment.   Your physician recommends that you schedule a follow-up appointment in: 1 MONTH WITH DR END OR CHRIS.   If you need a refill on your cardiac medications before your next appointment, please call your pharmacy.

## 2017-11-01 ENCOUNTER — Ambulatory Visit: Payer: Medicare HMO

## 2017-11-02 ENCOUNTER — Ambulatory Visit (INDEPENDENT_AMBULATORY_CARE_PROVIDER_SITE_OTHER): Payer: Medicare HMO

## 2017-11-02 VITALS — BP 140/80 | HR 59 | Resp 16

## 2017-11-02 DIAGNOSIS — I10 Essential (primary) hypertension: Secondary | ICD-10-CM

## 2017-11-02 NOTE — Patient Instructions (Signed)
1.) Reason for visit: BP check  2.) Name of MD requesting visit: Ward Givenshris Berge, NP  3.) H&P: HTN, CAD, CDHF. BP 215/115 at Feb 26 OV. Pt reported non-compliance with BP medications. She took them while in office and BP improved to 180/100.   4.) ROS related to problem: Pt here today for BP check. She reports taking all medications as prescribed, only missing one dose of PM metoprolol this week. She did restart HCTZ after 2/28 OV.   5.) Assessment and plan per MD: BP 140/80, HR 59, Oxygen 97% RA. She took morning medications today at 10am. As she does not have a BP cuff at home, she used neighbors and reports SBP 140s. We discussed need to purchase cuff for home monitoring.  Patient goes to bed and gets up at different times, sometimes going to bed a 6pm, 9pm, or 2am. She gets up anywhere from 9am - noon. We also discussed the need to take medications at the same time daily. She will work on this.   Reviewed information with Ward Givenshris Berge, NP, who instructs for patient to continue medications as prescribed. She has a follow up office visit with him April 2.

## 2017-11-29 ENCOUNTER — Ambulatory Visit: Payer: Medicare HMO | Admitting: Nurse Practitioner

## 2017-11-30 ENCOUNTER — Encounter: Payer: Self-pay | Admitting: Nurse Practitioner

## 2017-12-06 ENCOUNTER — Telehealth: Payer: Self-pay

## 2017-12-06 ENCOUNTER — Ambulatory Visit: Payer: Medicare HMO | Admitting: Family Medicine

## 2017-12-06 DIAGNOSIS — Z1239 Encounter for other screening for malignant neoplasm of breast: Secondary | ICD-10-CM

## 2017-12-06 NOTE — Telephone Encounter (Signed)
Mammogram order placed. Attempted to notify patient. Line was busy, will notify patient at upcoming visit this week.

## 2017-12-06 NOTE — Telephone Encounter (Signed)
Copied from CRM [id number]. Topic: Referral - Request >> Dec 06, 2017  8:10 AM Waymon AmatoBurton, Donna F wrote: CRM for notification. See Telephone encounter for: 12/06/17.pt  is calling to say that she got her letter that it is time for her mamogram and with the insurance she needs a referral from the provider   Best number [phone]

## 2017-12-20 ENCOUNTER — Other Ambulatory Visit: Payer: Self-pay | Admitting: Physician Assistant

## 2017-12-20 ENCOUNTER — Other Ambulatory Visit: Payer: Self-pay | Admitting: Interventional Cardiology

## 2017-12-20 NOTE — Telephone Encounter (Signed)
REFILL 

## 2017-12-20 NOTE — Telephone Encounter (Signed)
Refill Request.  

## 2017-12-28 ENCOUNTER — Encounter: Payer: Self-pay | Admitting: Family Medicine

## 2017-12-28 ENCOUNTER — Ambulatory Visit (INDEPENDENT_AMBULATORY_CARE_PROVIDER_SITE_OTHER): Payer: Medicare HMO | Admitting: Family Medicine

## 2017-12-28 VITALS — BP 158/84 | HR 74 | Ht 63.0 in | Wt 228.0 lb

## 2017-12-28 DIAGNOSIS — I10 Essential (primary) hypertension: Secondary | ICD-10-CM | POA: Diagnosis not present

## 2017-12-28 DIAGNOSIS — E785 Hyperlipidemia, unspecified: Secondary | ICD-10-CM

## 2017-12-28 DIAGNOSIS — Z9114 Patient's other noncompliance with medication regimen: Secondary | ICD-10-CM

## 2017-12-28 LAB — LP+ALT+AST PICCOLO, WAIVED
ALT (SGPT) PICCOLO, WAIVED: 22 U/L (ref 10–47)
AST (SGOT) Piccolo, Waived: 24 U/L (ref 11–38)
Chol/HDL Ratio Piccolo,Waive: 4.4 mg/dL
Cholesterol Piccolo, Waived: 206 mg/dL — ABNORMAL HIGH (ref <200)
HDL Chol Piccolo, Waived: 46 mg/dL — ABNORMAL LOW (ref >59)
LDL Chol Calc Piccolo Waived: 132 mg/dL — ABNORMAL HIGH (ref <100)
TRIGLYCERIDES PICCOLO,WAIVED: 136 mg/dL (ref <150)
VLDL Chol Calc Piccolo,Waive: 27 mg/dL (ref <30)

## 2017-12-28 NOTE — Assessment & Plan Note (Signed)
Discussed taking medications faithfully for better control.

## 2017-12-28 NOTE — Progress Notes (Signed)
BP (!) 158/84   Pulse 74   Ht  (1.6 m)   Wt 228 lb (103.4 kg)   LMP  (LMP Unknown)   SpO2 97%   BMI 40.39 kg/m    Subjective:    Patient ID: Jennifer Grant, female    DOB: Dec 31, 1950, 67 y.o.   MRN: [id number]  HPI: Jennifer Grant is a 67 y.o. female  Chief Complaint  Patient presents with  . Hypertension  . Hyperlipidemia  . Constipation  Discussed with patient constipation issues really affecting her blood pressure.  Has been taking medications most days. Cholesterol also taking medications sometimes.  Missing probably more days. Constipation wants something for constipation has some MiraLAX samples gave gave written directions on use of Dulcolax Colace are magnesium citrate.  Also may use Metamucil discussed various options.  Relevant past medical, surgical, family and social history reviewed and updated as indicated. Interim medical history since our last visit reviewed. Allergies and medications reviewed and updated.  Review of Systems  Constitutional: Negative.   Respiratory: Negative.   Cardiovascular: Negative.     Per HPI unless specifically indicated above     Objective:    BP (!) 158/84   Pulse 74   Ht  (1.6 m)   Wt 228 lb (103.4 kg)   LMP  (LMP Unknown)   SpO2 97%   BMI 40.39 kg/m   Wt Readings from Last 3 Encounters:  12/28/17 228 lb (103.4 kg)  10/25/17 226 lb (102.5 kg)  09/28/17 227 lb (103 kg)    Physical Exam  Constitutional: She is oriented to person, place, and time. She appears well-developed and well-nourished.  HENT:  Head: Normocephalic and atraumatic.  Eyes: Conjunctivae and EOM are normal.  Neck: Normal range of motion.  Cardiovascular: Normal rate, regular rhythm and normal heart sounds.  Pulmonary/Chest: Effort normal and breath sounds normal.  Musculoskeletal: Normal range of motion.  Neurological: She is alert and oriented to person, place, and time.  Skin: No erythema.  Psychiatric: She has a normal mood and  affect. Her behavior is normal. Judgment and thought content normal.    Results for orders placed or performed in visit on 06/28/17  Basic metabolic panel  Result Value Ref Range   Glucose 100 (H) 65 - 99 mg/dL   BUN 9 8 - 27 mg/dL   Creatinine, Ser 1.47 0.57 - 1.00 mg/dL   GFR calc non Af Amer 92 >59 mL/min/1.73   GFR calc Af Amer 106 >59 mL/min/1.73   BUN/Creatinine Ratio 14 12 - 28   Sodium 139 134 - 144 mmol/L   Potassium 3.9 3.5 - 5.2 mmol/L   Chloride 101 96 - 106 mmol/L   CO2 25 20 - 29 mmol/L   Calcium 9.8 8.7 - 10.3 mg/dL  LP+ALT+AST Piccolo, Waived  Result Value Ref Range   ALT (SGPT) Piccolo, Waived 13 10 - 47 U/L   AST (SGOT) Piccolo, Waived 21 11 - 38 U/L   Cholesterol Piccolo, Waived 192 <200 mg/dL   HDL Chol Piccolo, Waived 50 (L) >59 mg/dL   Triglycerides Piccolo,Waived 111 <150 mg/dL   Chol/HDL Ratio Piccolo,Waive 3.9 mg/dL   LDL Chol Calc Piccolo Waived 120 (H) <100 mg/dL   VLDL Chol Calc Piccolo,Waive 22 <30 mg/dL      Assessment & Plan:   Problem List Items Addressed This Visit      Cardiovascular and Mediastinum   Essential hypertension - Primary    Poor control  restsrt meds      Relevant Orders   Basic metabolic panel   LP+ALT+AST Piccolo, Waived     Other   Hyperlipidemia    Discussed taking medications faithfully for better control.      Relevant Orders   Basic metabolic panel   LP+ALT+AST Piccolo, Waived   Hx of medication noncompliance    Continued problem          Follow up plan: Return in about 3 months (around 03/30/2018) for Physical Exam.

## 2017-12-28 NOTE — Assessment & Plan Note (Signed)
Continued problem

## 2017-12-28 NOTE — Assessment & Plan Note (Signed)
Poor control  restsrt meds

## 2017-12-29 ENCOUNTER — Encounter: Payer: Self-pay | Admitting: Family Medicine

## 2017-12-29 LAB — BASIC METABOLIC PANEL
BUN / CREAT RATIO: 16 (ref 12–28)
BUN: 13 mg/dL (ref 8–27)
CHLORIDE: 95 mmol/L — AB (ref 96–106)
CO2: 26 mmol/L (ref 20–29)
Calcium: 10.3 mg/dL (ref 8.7–10.3)
Creatinine, Ser: 0.83 mg/dL (ref 0.57–1.00)
GFR calc Af Amer: 83 mL/min/{1.73_m2} (ref >59)
GFR calc non Af Amer: 72 mL/min/{1.73_m2} (ref >59)
GLUCOSE: 106 mg/dL — AB (ref 65–99)
Potassium: 3.7 mmol/L (ref 3.5–5.2)
Sodium: 136 mmol/L (ref 134–144)

## 2018-02-10 ENCOUNTER — Other Ambulatory Visit: Payer: Self-pay | Admitting: Family Medicine

## 2018-02-10 ENCOUNTER — Other Ambulatory Visit: Payer: Self-pay | Admitting: Interventional Cardiology

## 2018-02-13 NOTE — Telephone Encounter (Signed)
Outpatient Medication Detail    Disp Refills Start End   potassium chloride SA (KLOR-CON M20) 20 MEQ tablet 90 tablet 0 12/20/2017    Sig - Route: Take 1 tablet (20 mEq total) by mouth daily. Please make appt with Dr. Katrinka BlazingSmith for future refills Thank you - Oral   Sent to pharmacy as: potassium chloride SA (KLOR-CON M20) 20 MEQ tablet   Notes to Pharmacy: Pt must make appt with Dr. Katrinka BlazingSmith for future refills Thank you   E-Prescribing Status: Receipt confirmed by pharmacy (12/20/2017 3:01 PM EDT)   Pharmacy   [id number] - [place] - [address]

## 2018-03-27 ENCOUNTER — Ambulatory Visit: Payer: Medicare HMO

## 2018-04-04 ENCOUNTER — Encounter: Payer: Medicare HMO | Admitting: Family Medicine

## 2018-04-14 ENCOUNTER — Ambulatory Visit (INDEPENDENT_AMBULATORY_CARE_PROVIDER_SITE_OTHER): Payer: Medicare HMO

## 2018-04-14 VITALS — BP 152/80 | HR 56 | Temp 98.0°F | Resp 16 | Ht 62.0 in | Wt 224.7 lb

## 2018-04-14 DIAGNOSIS — Z0000 Encounter for general adult medical examination without abnormal findings: Secondary | ICD-10-CM | POA: Diagnosis not present

## 2018-04-14 NOTE — Patient Instructions (Addendum)
Jennifer Grant , Thank you for taking time to come for yourMedicare Wellness Visit. I appreciate your ongoing commitment to your health goals. Please review the following plan we discussed and let me know if I can assist you in the future.   Screening recommendations/referrals: Colonoscopy: Completed 07/18/2014 Mammogram: Completed 01/12/2017 Bone Density: Completed 11/26/2010 Recommended yearly ophthalmology/optometry visit for glaucoma screening and checkup Recommended yearly dental visit for hygiene and checkup  Vaccinations: Influenza vaccine: up to date, due 04/2018 Pneumococcal vaccine: up to date Tdap vaccine: up to date  Shingles vaccine: shingrix eligible, check with your insurance company for coverage  Advanced directives: Advance directive discussed with you today. Even though you declined this today please call our office should you change your mind and we can give you the proper paperwork for you to fill out.  Conditions/risks identified: Recommend drinking at least 6-8 glasses of water a day   Next appointment: Follow up on 08/09/2018 at 2:00pm with Dr.Crissman, Follow up in one year for your annual wellness exam.   Preventive Care 65 Years and Older, Female Preventive care refers to lifestyle choices and visits with your health care provider that can promote health and wellness. What does preventive care include?  A yearly physical exam. This is also called an annual well check.  Dental exams once or twice a year.  Routine eye exams. Ask your health care provider how often you should have your eyes checked.  Personal lifestyle choices, including:  Daily care of your teeth and gums.  Regular physical activity.  Eating a healthy diet.  Avoiding tobacco and drug use.  Limiting alcohol use.  Practicing safe sex.  Taking low-dose aspirin every day.  Taking vitamin and mineral supplements as recommended by your health care provider. What happens during an  annual well check? The services and screenings done by your health care provider during your annual well check will depend on your age, overall health, lifestyle risk factors, and family history of disease. Counseling  Your health care provider may ask you questions about your:  Alcohol use.  Tobacco use.  Drug use.  Emotional well-being.  Home and relationship well-being.  Sexual activity.  Eating habits.  History of falls.  Memory and ability to understand (cognition).  Work and work Astronomerenvironment.  Reproductive health. Screening  You may have the following tests or measurements:  Height, weight, and BMI.  Blood pressure.  Lipid and cholesterol levels. These may be checked every 5 years, or more frequently if you are over 176 years old.  Skin check.  Lung cancer screening. You may have this screening every year starting at age 69 if you have a 30-pack-year history of smoking and currently smoke or have quit within the past 15 years.  Fecal occult blood test (FOBT) of the stool. You may have this test every year starting at age 750.  Flexible sigmoidoscopy or colonoscopy. You may have a sigmoidoscopy every 5 years or a colonoscopy every 10 years starting at age 69.  Hepatitis C blood test.  Hepatitis B blood test.  Sexually transmitted disease (STD) testing.  Diabetes screening. This is done by checking your blood sugar (glucose) after you have not eaten for a while (fasting). You may have this done every 1-3 years.  Bone density scan. This is done to screen for osteoporosis. You may have this done starting at age 69.  Mammogram. This may be done every 1-2 years. Talk to your health care provider about how often you should have  regular mammograms. Talk with your health care provider about your test results, treatment options, and if necessary, the need for more tests. Vaccines  Your health care provider may recommend certain vaccines, such as:  Influenza  vaccine. This is recommended every year.  Tetanus, diphtheria, and acellular pertussis (Tdap, Td) vaccine. You may need a Td booster every 10 years.  Zoster vaccine. You may need this after age 7.  Pneumococcal 13-valent conjugate (PCV13) vaccine. One dose is recommended after age 28.  Pneumococcal polysaccharide (PPSV23) vaccine. One dose is recommended after age 70. Talk to your health care provider about which screenings and vaccines you need and how often you need them. This information is not intended to replace advice given to you by your health care provider. Make sure you discuss any questions you have with your health care provider. Document Released: 09/12/2015 Document Revised: 05/05/2016 Document Reviewed: 06/17/2015 Elsevier Interactive Patient Education  2017 [employer] Prevention in the Home Falls can cause injuries. They can happen to people of all ages. There are many things you can do to make your home safe and to help prevent falls. What can I do on the outside of my home?  Regularly fix the edges of walkways and driveways and fix any cracks.  Remove anything that might make you trip as you walk through a door, such as a raised step or threshold.  Trim any bushes or trees on the path to your home.  Use bright outdoor lighting.  Clear any walking paths of anything that might make someone trip, such as rocks or tools.  Regularly check to see if handrails are loose or broken. Make sure that both sides of any steps have handrails.  Any raised decks and porches should have guardrails on the edges.  Have any leaves, snow, or ice cleared regularly.  Use sand or salt on walking paths during winter.  Clean up any spills in your garage right away. This includes oil or grease spills. What can I do in the bathroom?  Use night lights.  Install grab bars by the toilet and in the tub and shower. Do not use towel bars as grab bars.  Use non-skid mats or decals  in the tub or shower.  If you need to sit down in the shower, use a plastic, non-slip stool.  Keep the floor dry. Clean up any water that spills on the floor as soon as it happens.  Remove soap buildup in the tub or shower regularly.  Attach bath mats securely with double-sided non-slip rug tape.  Do not have throw rugs and other things on the floor that can make you trip. What can I do in the bedroom?  Use night lights.  Make sure that you have a light by your bed that is easy to reach.  Do not use any sheets or blankets that are too big for your bed. They should not hang down onto the floor.  Have a firm chair that has side arms. You can use this for support while you get dressed.  Do not have throw rugs and other things on the floor that can make you trip. What can I do in the kitchen?  Clean up any spills right away.  Avoid walking on wet floors.  Keep items that you use a lot in easy-to-reach places.  If you need to reach something above you, use a strong step stool that has a grab bar.  Keep electrical cords out of the  way.  Do not use floor polish or wax that makes floors slippery. If you must use wax, use non-skid floor wax.  Do not have throw rugs and other things on the floor that can make you trip. What can I do with my stairs?  Do not leave any items on the stairs.  Make sure that there are handrails on both sides of the stairs and use them. Fix handrails that are broken or loose. Make sure that handrails are as long as the stairways.  Check any carpeting to make sure that it is firmly attached to the stairs. Fix any carpet that is loose or worn.  Avoid having throw rugs at the top or bottom of the stairs. If you do have throw rugs, attach them to the floor with carpet tape.  Make sure that you have a light switch at the top of the stairs and the bottom of the stairs. If you do not have them, ask someone to add them for you. What else can I do to help  prevent falls?  Wear shoes that:  Do not have high heels.  Have rubber bottoms.  Are comfortable and fit you well.  Are closed at the toe. Do not wear sandals.  If you use a stepladder:  Make sure that it is fully opened. Do not climb a closed stepladder.  Make sure that both sides of the stepladder are locked into place.  Ask someone to hold it for you, if possible.  Clearly mark and make sure that you can see:  Any grab bars or handrails.  First and last steps.  Where the edge of each step is.  Use tools that help you move around (mobility aids) if they are needed. These include:  Canes.  Walkers.  Scooters.  Crutches.  Turn on the lights when you go into a dark area. Replace any light bulbs as soon as they burn out.  Set up your furniture so you have a clear path. Avoid moving your furniture around.  If any of your floors are uneven, fix them.  If there are any pets around you, be aware of where they are.  Review your medicines with your doctor. Some medicines can make you feel dizzy. This can increase your chance of falling. Ask your doctor what other things that you can do to help prevent falls. This information is not intended to replace advice given to you by your health care provider. Make sure you discuss any questions you have with your health care provider. Document Released: 06/12/2009 Document Revised: 01/22/2016 Document Reviewed: 09/20/2014 Elsevier Interactive Patient Education  2017 [employer].

## 2018-04-14 NOTE — Progress Notes (Signed)
Subjective:   Arville LimeValerie E Balcom is a 67 y.o. female who presents for Medicare Annual (Subsequent) preventive examination.  Review of Systems:  Cardiac Risk Factors include: advanced age (>2655men, 82>65 women);hypertension;dyslipidemia;obesity (BMI >30kg/m2)     Objective:     Vitals: BP (!) 152/80 (BP Location: Left Arm, Patient Position: Sitting)   Pulse (!) 56   Temp 98 F (36.7 C) (Temporal)   Resp 16   Ht 5\' 2"  (1.575 m)   Wt 224 lb 11.2 oz (101.9 kg)   LMP  (LMP Unknown)   BMI 41.10 kg/m   Body mass index is 41.1 kg/m.  Advanced Directives 04/14/2018 03/11/2017 07/16/2015  Does Patient Have a Medical Advance Directive? No No No  Would patient like information on creating a medical advance directive? No - Patient declined Yes (MAU/Ambulatory/Procedural Areas - Information given) No - patient declined information    Tobacco Social History   Tobacco Use  Smoking Status Never Smoker  Smokeless Tobacco Never Used     Counseling given: Not Answered   Clinical Intake:  Pre-visit preparation completed: Yes  Pain : No/denies pain     Nutritional Status: BMI > 30  Obese Nutritional Risks: None Diabetes: No  How often do you need to have someone help you when you read instructions, pamphlets, or other written materials from your doctor or pharmacy?: 1 - Never What is the last grade level you completed in school?: 11th grade  Interpreter Needed?: No  Information entered by :: Tiffany Hill,LPN   Past Medical History:  Diagnosis Date  . CAD (coronary artery disease)    a. 2004 s/p MI w/ PCI/stenting to the LCX, OM1, OM2, RCA, RPDA; b. 05/2015 MI/PCI: 100 OM1 (2.75x20 & 3.0x8 Promus Premier DES'), PTCA OM2; c. 07/2015 Cath/Staged PCI: LM 60, LAD 75d, LCX 4640m ISR, OM1 ok, OM2 25 ISR, RCA 50p, 2253m/d, 85d (3.0x28 Promus Premier DES).  . Diastolic dysfunction    a. 05/2015 Echo: EF 50-55%, severe lateral HK. Gr1 DD, mildly dil LA. Mildly reduced RV fxn.  . 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     "got booster shot a few times"  . GERD (gastroesophageal reflux disease)   . Hyperlipidemia   . Hypertensive heart disease   . Menopausal state   . Morbid obesity (HCC)   . Noncompliance    Past Surgical History:  Procedure Laterality Date  . CARDIAC CATHETERIZATION N/A 06/08/2015   Procedure: Left Heart Cath and Coronary Angiography;  Surgeon: Lyn RecordsHenry W Smith, MD;  [facility];  Service: Cardiovascular;  Laterality: N/A;  . CARDIAC CATHETERIZATION N/A 06/08/2015   Procedure: Coronary Stent Intervention;  Surgeon: Lyn RecordsHenry W Smith, MD;  [facility];  Service: Cardiovascular;  Laterality: N/A;  . CARDIAC CATHETERIZATION N/A 07/16/2015   Procedure: Coronary Stent Intervention;  Surgeon: Lyn RecordsHenry W Smith, MD;  [facility];  Service: Cardiovascular;  Laterality: N/A;  . CORONARY ANGIOPLASTY WITH STENT PLACEMENT     "6 stents before 05/2015"  . KNEE ARTHROSCOPY Left 1990's  . TUBAL LIGATION  ~ 1978   Family History  Problem Relation Age of Onset  . Healthy Mother   . Prostate cancer Father   . Kidney disease Father   . Healthy Brother   . Cancer Neg Hx   . Diabetes Neg Hx   . Heart disease Neg Hx    Social History   Socioeconomic History  . Marital status: Widowed    Spouse name: Not on file  .  Number of children: Not on file  . Years of education: Not on file  . Highest education level: 11th grade  Occupational History  . Not on file  Social Needs  . Financial resource strain: Not hard at all  . Food insecurity:    Worry: Never true    Inability: Never true  . Transportation needs:    Medical: No    Non-medical: No  Tobacco Use  . Smoking status: Never Smoker  . Smokeless tobacco: Never Used  Substance and Sexual Activity  . Alcohol use: No  . Drug use: No  . Sexual activity: Never    Birth control/protection: Surgical  Lifestyle  . Physical activity:    Days per week: 0 days    Minutes per session: 0 min  . Stress:  Not at all  Relationships  . Social connections:    Talks on phone: More than three times a week    Gets together: More than three times a week    Attends religious service: More than 4 times per year    Active member of club or organization: No    Attends meetings of clubs or organizations: Never    Relationship status: Widowed  Other Topics Concern  . Not on file  Social History Narrative  . Not on file    Outpatient Encounter Medications as of 04/14/2018  Medication Sig  . amLODipine (NORVASC) 10 MG tablet Take 1 tablet (10 mg total) by mouth daily.  Marland Kitchen aspirin EC 81 MG tablet Take 81 mg by mouth daily.  . clopidogrel (PLAVIX) 75 MG tablet Take 1 tablet (75 mg total) by mouth daily.  . hydrochlorothiazide (HYDRODIURIL) 25 MG tablet TAKE 1 TABLET BY MOUTH ONCE DAILY  . isosorbide mononitrate (IMDUR) 60 MG 24 hr tablet TAKE ONE TABLET BY MOUTH ONCE DAILY  . losartan (COZAAR) 100 MG tablet Take 1 tablet (100 mg total) by mouth daily.  Marland Kitchen lovastatin (MEVACOR) 20 MG tablet TAKE 1 TABLET BY MOUTH AT BEDTIME  . metoprolol tartrate (LOPRESSOR) 50 MG tablet Take 1 tablet (50 mg total) by mouth 2 (two) times daily.  . potassium chloride SA (KLOR-CON M20) 20 MEQ tablet Take 1 tablet (20 mEq total) by mouth daily. Please make appt with Dr. Katrinka Blazing for future refills Thank you  . hydrochlorothiazide (HYDRODIURIL) 25 MG tablet Take 25 mg by mouth daily.  . nitroGLYCERIN (NITROSTAT) 0.4 MG SL tablet Place 1 tablet (0.4 mg total) under the tongue every 5 (five) minutes x 3 doses as needed for chest pain. (Patient not taking: Reported on 04/14/2018)   No facility-administered encounter medications on file as of 04/14/2018.     Activities of Daily Living In your present state of health, do you have any difficulty performing the following activities: 04/14/2018  Hearing? N  Vision? N  Difficulty concentrating or making decisions? N  Walking or climbing stairs? N  Dressing or bathing? N  Doing errands,  shopping? N  Preparing Food and eating ? N  Using the Toilet? N  In the past six months, have you accidently leaked urine? N  Do you have problems with loss of bowel control? N  Managing your Medications? N  Managing your Finances? N  Housekeeping or managing your Housekeeping? N  Some recent data might be hidden    Patient Care Team: Steele Sizer, MD as PCP - General (Family Medicine) End, Cristal Deer, MD as PCP - Cardiology (Cardiology) Lyn Records, MD as Consulting Physician (Cardiology)  Assessment:   This is a routine wellness examination for Vikki PortsValerie.  Exercise Activities and Dietary recommendations Current Exercise Habits: The patient does not participate in regular exercise at present, Exercise limited by: None identified  Goals    . Increase water intake     Recommend drinking at least 6-8 glasses of water a day       Fall Risk Fall Risk  04/14/2018 12/28/2017 12/28/2017 09/28/2017 03/21/2017  Falls in the past year? No No No No No  Number falls in past yr: - - - - -  Injury with Fall? - - - - -  Follow up - - - - -   Is the patient's home free of loose throw rugs in walkways, pet beds, electrical cords, etc?   yes      Grab bars in the bathroom? no      Handrails on the stairs?   yes      Adequate lighting?   yes  Timed Get Up and Go performed: Completed in 8 seconds with no use of assistive devices, steady gait. No intervention needed at this time.   Depression Screen PHQ 2/9 Scores 04/14/2018 12/28/2017 12/28/2017 09/28/2017  PHQ - 2 Score 0 0 0 0     Cognitive Function     6CIT Screen 04/14/2018 03/11/2017  What Year? 0 points 0 points  What month? 0 points 0 points  What time? 0 points 0 points  Count back from 20 0 points 0 points  Months in reverse 0 points 0 points  Repeat phrase 0 points 0 points  Total Score 0 0    Immunization History  Administered Date(s) Administered  . Influenza,inj,Quad PF,6+ Mos 06/18/2015  . Influenza-Unspecified  08/12/2014  . Pneumococcal Conjugate-13 08/12/2014  . Pneumococcal Polysaccharide-23 03/04/2010, 03/24/2016  . Td 08/30/2004  . Tdap 03/24/2016    Qualifies for Shingles Vaccine? Yes, discussed shingrix vaccine   Screening Tests Health Maintenance  Topic Date Due  . INFLUENZA VACCINE  03/30/2018  . MAMMOGRAM  01/12/2019  . COLONOSCOPY  07/19/2019  . TETANUS/TDAP  03/24/2026  . DEXA SCAN  Completed  . Hepatitis C Screening  Completed  . PNA vac Low Risk Adult  Completed    Cancer Screenings: Lung: Low Dose CT Chest recommended if Age 7-80 years, 30 pack-year currently smoking OR have quit w/in 15years. Patient does not qualify. Breast:  Up to date on Mammogram? Yes   Up to date of Bone Density/Dexa? Yes Colorectal: completed 07/18/2014  Additional Screenings:  Hepatitis C Screening: completed 11/04/2015     Plan:     I have personally reviewed and addressed the Medicare Annual Wellness questionnaire and have noted the following in the patient's chart:  A. Medical and social history B. Use of alcohol, tobacco or illicit drugs  C. Current medications and supplements D. Functional ability and status E.  Nutritional status F.  Physical activity G. Advance directives H. List of other physicians I.  Hospitalizations, surgeries, and ER visits in previous 12 months J.  Vitals K. Screenings such as hearing and vision if needed, cognitive and depression L. Referrals and appointments   In addition, I have reviewed and discussed with patient certain preventive protocols, quality metrics, and best practice recommendations. A written personalized care plan for preventive services as well as general preventive health recommendations were provided to patient.   Signed,  Marin Robertsiffany Hill, LPN Nurse Health Advisor   Nurse Notes:none

## 2018-04-18 ENCOUNTER — Ambulatory Visit (INDEPENDENT_AMBULATORY_CARE_PROVIDER_SITE_OTHER): Payer: Medicare HMO | Admitting: Physician Assistant

## 2018-04-18 ENCOUNTER — Encounter: Payer: Self-pay | Admitting: Physician Assistant

## 2018-04-18 ENCOUNTER — Other Ambulatory Visit: Payer: Self-pay

## 2018-04-18 VITALS — BP 144/85 | HR 58 | Temp 98.6°F | Ht 63.0 in | Wt 223.3 lb

## 2018-04-18 DIAGNOSIS — R6889 Other general symptoms and signs: Secondary | ICD-10-CM

## 2018-04-18 DIAGNOSIS — K219 Gastro-esophageal reflux disease without esophagitis: Secondary | ICD-10-CM

## 2018-04-18 DIAGNOSIS — R0989 Other specified symptoms and signs involving the circulatory and respiratory systems: Secondary | ICD-10-CM

## 2018-04-18 DIAGNOSIS — R490 Dysphonia: Secondary | ICD-10-CM

## 2018-04-18 NOTE — Progress Notes (Signed)
   Subjective:    Patient ID: Jennifer Grant, female    DOB: 05/28/1951, 67 y.o.   MRN: [id number]  Jennifer Grant is a 67 y.o. female presenting on 04/18/2018 for Pain (pt states she has been having throat concerns x 2 years. Last friday she had a wellness check up with the nurse and she recomended to schedule an office visit today.)   HPI   Reports history of throat clearing, can cough up some clear phlegm. Has had some hoarseness while singing. Denies smoking. Denies previous surgery.   Social History   Tobacco Use  . Smoking status: Never Smoker  . Smokeless tobacco: Never Used  Substance Use Topics  . Alcohol use: No  . Drug use: No    Review of Systems Per HPI unless specifically indicated above     Objective:    BP (!) 144/85   Pulse (!) 58   Temp 98.6 F (37 C) (Oral)   Ht 5\' 3"  (1.6 m)   Wt 223 lb 4.8 oz (101.3 kg)   LMP  (LMP Unknown)   SpO2 97%   BMI 39.56 kg/m   Wt Readings from Last 3 Encounters:  04/18/18 223 lb 4.8 oz (101.3 kg)  04/14/18 224 lb 11.2 oz (101.9 kg)  12/28/17 228 lb (103.4 kg)    Physical Exam  Constitutional: She is oriented to person, place, and time. She appears well-developed and well-nourished.  HENT:  Right Ear: External ear normal.  Left Ear: External ear normal.  Mouth/Throat: Oropharynx is clear and moist. No oropharyngeal exudate.  Neck: Neck supple. No thyromegaly present.  Lymphadenopathy:    She has no cervical adenopathy.  Neurological: She is alert and oriented to person, place, and time.  Skin: Skin is warm and dry.  Psychiatric: She has a normal mood and affect. Her behavior is normal.   Results for orders placed or performed in visit on 12/28/17  Basic metabolic panel  Result Value Ref Range   Glucose 106 (H) 65 - 99 mg/dL   BUN 13 8 - 27 mg/dL   Creatinine, Ser 4.090.83 0.57 - 1.00 mg/dL   GFR calc non Af Amer 72 >59 mL/min/1.73   GFR calc Af Amer 83 >59 mL/min/1.73   BUN/Creatinine Ratio 16 12 - 28   Sodium  136 134 - 144 mmol/L   Potassium 3.7 3.5 - 5.2 mmol/L   Chloride 95 (L) 96 - 106 mmol/L   CO2 26 20 - 29 mmol/L   Calcium 10.3 8.7 - 10.3 mg/dL  LP+ALT+AST Piccolo, Waived  Result Value Ref Range   ALT (SGPT) Piccolo, Waived 22 10 - 47 U/L   AST (SGOT) Piccolo, Waived 24 11 - 38 U/L   Cholesterol Piccolo, Waived 206 (H) <200 mg/dL   HDL Chol Piccolo, Waived 46 (L) >59 mg/dL   Triglycerides Piccolo,Waived 136 <150 mg/dL   Chol/HDL Ratio Piccolo,Waive 4.4 mg/dL   LDL Chol Calc Piccolo Waived 132 (H) <100 mg/dL   VLDL Chol Calc Piccolo,Waive 27 <30 mg/dL      Assessment & Plan:  1. Gastroesophageal reflux disease, esophagitis presence not specified  Suspect she has microscopic acid reflux. Can start zantac 150 mg BID for several weeks and monitor for imporvement.   2. Throat clearing   3. Hoarseness    Follow up plan: Return if symptoms worsen or fail to improve.  Osvaldo AngstAdriana Pollak, PA-C [facility]  [facility] 04/18/2018, 4:54 PM

## 2018-04-18 NOTE — Patient Instructions (Signed)
Zantac 150 mg twice daily 30 minutes before a meal, try this for a couple of weeks.

## 2018-06-04 ENCOUNTER — Other Ambulatory Visit: Payer: Self-pay | Admitting: Family Medicine

## 2018-06-04 ENCOUNTER — Other Ambulatory Visit: Payer: Self-pay | Admitting: Interventional Cardiology

## 2018-06-04 DIAGNOSIS — I2510 Atherosclerotic heart disease of native coronary artery without angina pectoris: Secondary | ICD-10-CM

## 2018-06-04 DIAGNOSIS — I10 Essential (primary) hypertension: Secondary | ICD-10-CM

## 2018-06-05 NOTE — Telephone Encounter (Signed)
Requested medication (s) are due for refill today: Yes  Requested medication (s) are on the active medication list: Yes  Last refill:  03/21/17 for all requested medications  Future visit scheduled: Yes  Notes to clinic:  Unable to refill per protocol due to expired rx and out of range labs.     Requested Prescriptions  Pending Prescriptions Disp Refills   amLODipine (NORVASC) 10 MG tablet [Pharmacy Med Name: AMLODIPINE 10MG  TAB] 90 tablet 4    Sig: TAKE 1 TABLET BY MOUTH ONCE DAILY     Cardiovascular:  Calcium Channel Blockers Failed - 06/04/2018  1:18 PM      Failed - Last BP in normal range    BP Readings from Last 1 Encounters:  04/18/18 (!) 144/85         Passed - Valid encounter within last 6 months    Recent Outpatient Visits          1 month ago Gastroesophageal reflux disease, esophagitis presence not specified   [facility] [place], Adriana M, PA-C   5 months ago Essential hypertension   [facility] Crissman, Redge Gainer, MD   8 months ago Essential hypertension   [facility] Crissman, Redge Gainer, MD   11 months ago Essential hypertension   [facility] Crissman, Redge Gainer, MD   1 year ago Annual physical exam   [facility] Crissman, Redge Gainer, MD      Future Appointments            In 2 months Crissman, Redge Gainer, MD [facility]   In 10 months  [facility]          clopidogrel (PLAVIX) 75 MG tablet [Pharmacy Med Name: CLOPIDOGREL 75MG     TAB] 90 tablet 4    Sig: TAKE 1 TABLET BY MOUTH ONCE DAILY     Hematology: Antiplatelets - clopidogrel Failed - 06/04/2018  1:18 PM      Failed - Evaluate AST, ALT within 2 months of therapy initiation.      Failed - AST in normal range and within 360 days    AST (SGOT) Piccolo, Waived  Date Value Ref Range Status  12/28/2017 24 11 - 38 U/L Final         Failed - HCT in normal range and within 180 days    Hematocrit  Date Value Ref  Range Status  03/21/2017 39.3 34.0 - 46.6 % Final         Failed - HGB in normal range and within 180 days    Hemoglobin  Date Value Ref Range Status  03/21/2017 12.5 11.1 - 15.9 g/dL Final         Failed - PLT in normal range and within 180 days    Platelets  Date Value Ref Range Status  03/21/2017 238 150 - 379 x10E3/uL Final         Passed - ALT in normal range and within 360 days    ALT (SGPT) Piccolo, Waived  Date Value Ref Range Status  12/28/2017 22 10 - 47 U/L Final         Passed - Valid encounter within last 6 months    Recent Outpatient Visits          1 month ago Gastroesophageal reflux disease, esophagitis presence not specified   [facility] Trey Sailors, PA-C   5 months ago Essential hypertension   [facility] Vonita Moss  A, MD   8 months ago Essential hypertension   [facility] Crissman, Redge Gainer, MD   11 months ago Essential hypertension   [facility] Crissman, Redge Gainer, MD   1 year ago Annual physical exam   [facility] Crissman, Redge Gainer, MD      Future Appointments            In 2 months Crissman, Redge Gainer, MD [facility]   In 10 months  [facility]          losartan (COZAAR) 100 MG tablet [Pharmacy Med Name: LOSARTAN 100MG    TAB] 90 tablet 4    Sig: TAKE 1 TABLET BY MOUTH ONCE DAILY     Cardiovascular:  Angiotensin Receptor Blockers Failed - 06/04/2018  1:18 PM      Failed - Last BP in normal range    BP Readings from Last 1 Encounters:  04/18/18 (!) 144/85         Passed - Cr in normal range and within 180 days    Creat  Date Value Ref Range Status  07/23/2015 0.70 0.50 - 0.99 mg/dL Final   Creatinine, Ser  Date Value Ref Range Status  12/28/2017 0.83 0.57 - 1.00 mg/dL Final         Passed - K in normal range and within 180 days    Potassium  Date Value Ref Range Status  12/28/2017 3.7 3.5 - 5.2 mmol/L Final         Passed -  Patient is not pregnant      Passed - Valid encounter within last 6 months    Recent Outpatient Visits          1 month ago Gastroesophageal reflux disease, esophagitis presence not specified   [facility] [place], Adriana M, PA-C   5 months ago Essential hypertension   [facility] Crissman, Redge Gainer, MD   8 months ago Essential hypertension   [facility] Crissman, Redge Gainer, MD   11 months ago Essential hypertension   [facility] Crissman, Redge Gainer, MD   1 year ago Annual physical exam   [facility] Crissman, Redge Gainer, MD      Future Appointments            In 2 months Crissman, Redge Gainer, MD [facility]   In 10 months  [facility]          metoprolol tartrate (LOPRESSOR) 50 MG tablet [Pharmacy Med Name: METOPROLOL TART 50MG  TAB] 180 tablet 4    Sig: TAKE 1 TABLET BY MOUTH TWICE DAILY     Cardiovascular:  Beta Blockers Failed - 06/04/2018  1:18 PM      Failed - Last BP in normal range    BP Readings from Last 1 Encounters:  04/18/18 (!) 144/85         Passed - Last Heart Rate in normal range    Pulse Readings from Last 1 Encounters:  04/18/18 (!) 58         Passed - Valid encounter within last 6 months    Recent Outpatient Visits          1 month ago Gastroesophageal reflux disease, esophagitis presence not specified   [facility] [place], Adriana M, PA-C   5 months ago Essential hypertension   [facility] Crissman, Redge Gainer, MD   8 months ago Essential hypertension  [facility] Crissman, Redge Gainer, MD   11 months ago Essential hypertension   [facility] Crissman, Redge Gainer, MD   1 year ago Annual physical exam   [facility] Crissman, Redge Gainer, MD      Future Appointments            In 2 months Crissman, Redge Gainer, MD [facility]   In 10 months  [facility]

## 2018-06-12 ENCOUNTER — Other Ambulatory Visit: Payer: Self-pay

## 2018-06-12 DIAGNOSIS — I2510 Atherosclerotic heart disease of native coronary artery without angina pectoris: Secondary | ICD-10-CM

## 2018-06-12 MED ORDER — NITROGLYCERIN 0.4 MG SL SUBL: 0.4000 mg | SUBLINGUAL_TABLET | SUBLINGUAL | 3 refills | Status: DC | PRN

## 2018-06-12 NOTE — Telephone Encounter (Signed)
Upcoming appointment 08/09/2018

## 2018-08-09 ENCOUNTER — Encounter: Payer: Self-pay | Admitting: Family Medicine

## 2018-08-09 ENCOUNTER — Ambulatory Visit (INDEPENDENT_AMBULATORY_CARE_PROVIDER_SITE_OTHER): Payer: Medicare HMO | Admitting: Family Medicine

## 2018-08-09 VITALS — BP 163/98 | HR 61 | Temp 98.4°F | Ht 63.0 in | Wt 227.8 lb

## 2018-08-09 DIAGNOSIS — I10 Essential (primary) hypertension: Secondary | ICD-10-CM | POA: Diagnosis not present

## 2018-08-09 DIAGNOSIS — I2510 Atherosclerotic heart disease of native coronary artery without angina pectoris: Secondary | ICD-10-CM | POA: Diagnosis not present

## 2018-08-09 DIAGNOSIS — Z7189 Other specified counseling: Secondary | ICD-10-CM

## 2018-08-09 DIAGNOSIS — Z9114 Patient's other noncompliance with medication regimen: Secondary | ICD-10-CM | POA: Diagnosis not present

## 2018-08-09 DIAGNOSIS — I5033 Acute on chronic diastolic (congestive) heart failure: Secondary | ICD-10-CM

## 2018-08-09 DIAGNOSIS — E785 Hyperlipidemia, unspecified: Secondary | ICD-10-CM | POA: Diagnosis not present

## 2018-08-09 DIAGNOSIS — Z23 Encounter for immunization: Secondary | ICD-10-CM | POA: Diagnosis not present

## 2018-08-09 LAB — URINALYSIS, ROUTINE W REFLEX MICROSCOPIC
BILIRUBIN UA: NEGATIVE
Glucose, UA: NEGATIVE
KETONES UA: NEGATIVE
NITRITE UA: NEGATIVE
PH UA: 7 (ref 5.0–7.5)
PROTEIN UA: NEGATIVE
Specific Gravity, UA: 1.01 (ref 1.005–1.030)
UUROB: 0.2 mg/dL (ref 0.2–1.0)

## 2018-08-09 MED ORDER — CLOPIDOGREL BISULFATE 75 MG PO TABS: 75.0000 mg | ORAL_TABLET | Freq: Every day | ORAL | 4 refills | Status: DC

## 2018-08-09 MED ORDER — LOVASTATIN 20 MG PO TABS: 20.0000 mg | ORAL_TABLET | Freq: Every day | ORAL | 4 refills | Status: DC

## 2018-08-09 MED ORDER — AMLODIPINE BESYLATE 10 MG PO TABS: 10.0000 mg | ORAL_TABLET | Freq: Every day | ORAL | 4 refills | Status: DC

## 2018-08-09 MED ORDER — LOSARTAN POTASSIUM 100 MG PO TABS: 100.0000 mg | ORAL_TABLET | Freq: Every day | ORAL | 4 refills | Status: DC

## 2018-08-09 MED ORDER — METOPROLOL TARTRATE 50 MG PO TABS: 50.0000 mg | ORAL_TABLET | Freq: Two times a day (BID) | ORAL | 4 refills | Status: DC

## 2018-08-09 MED ORDER — HYDROCHLOROTHIAZIDE 25 MG PO TABS: 25.0000 mg | ORAL_TABLET | Freq: Every day | ORAL | 4 refills | Status: DC

## 2018-08-09 MED ORDER — DERMACLOUD EX CREA: 1.0000 "application " | TOPICAL_CREAM | Freq: Every day | CUTANEOUS | 3 refills | Status: DC

## 2018-08-09 NOTE — Patient Instructions (Signed)

## 2018-08-09 NOTE — Assessment & Plan Note (Signed)
A voluntary discussion about advanced care planning including explanation and discussion of advanced directives was extentively discussed with the patient.  Explained about the healthcare proxy and living will was reviewed and packet with forms with expiration of how to fill them out was given.  Time spent: Encounter 16+ min individuals present: Patient 

## 2018-08-09 NOTE — Assessment & Plan Note (Signed)
Patient is a little better with taking her medications

## 2018-08-09 NOTE — Assessment & Plan Note (Signed)
The current medical regimen is effective;  continue present plan and medications.  

## 2018-08-09 NOTE — Assessment & Plan Note (Signed)
Discussed weight loss. 

## 2018-08-09 NOTE — Assessment & Plan Note (Signed)
Poor control as usual with possible compliance issues

## 2018-08-09 NOTE — Progress Notes (Signed)
BP (!) 163/98 (BP Location: Left Arm, Cuff Size: Large)   Pulse 61   Temp 98.4 F (36.9 C) (Oral)   Ht 5\' 3"  (1.6 m)   Wt 227 lb 12.8 oz (103.3 kg)   LMP  (LMP Unknown)   SpO2 96%   BMI 40.35 kg/m    Subjective:    Patient ID: Jennifer Grant, female    DOB: 1950/09/12, 67 y.o.   MRN: [id number]  HPI: Jennifer Grant is a 67 y.o. female  Chief Complaint  Patient presents with  . Annual Exam    pt had wellness exam in August, pt requesting prescriptions for magnesium and derma cloud cream   Patient all in all doing well taking her blood pressure medication faithfully without problems.  Also taking other medications for heart and cholesterol also without problems. Patient getting ready to go to [place] for a month wants to make sure she has refills.  Relevant past medical, surgical, family and social history reviewed and updated as indicated. Interim medical history since our last visit reviewed. Allergies and medications reviewed and updated.  Review of Systems  Constitutional: Negative.   HENT: Negative.   Eyes: Negative.   Respiratory: Negative.   Cardiovascular: Negative.   Gastrointestinal: Negative.   Endocrine: Negative.   Genitourinary: Negative.   Musculoskeletal: Negative.   Skin: Negative.   Allergic/Immunologic: Negative.   Neurological: Negative.   Hematological: Negative.   Psychiatric/Behavioral: Negative.     Per HPI unless specifically indicated above     Objective:    BP (!) 163/98 (BP Location: Left Arm, Cuff Size: Large)   Pulse 61   Temp 98.4 F (36.9 C) (Oral)   Ht 5\' 3"  (1.6 m)   Wt 227 lb 12.8 oz (103.3 kg)   LMP  (LMP Unknown)   SpO2 96%   BMI 40.35 kg/m   Wt Readings from Last 3 Encounters:  08/09/18 227 lb 12.8 oz (103.3 kg)  04/18/18 223 lb 4.8 oz (101.3 kg)  04/14/18 224 lb 11.2 oz (101.9 kg)    Physical Exam  Constitutional: She is oriented to person, place, and time. She appears well-developed and well-nourished.    HENT:  Head: Normocephalic and atraumatic.  Right Ear: External ear normal.  Left Ear: External ear normal.  Nose: Nose normal.  Mouth/Throat: Oropharynx is clear and moist.  Eyes: Pupils are equal, round, and reactive to light. Conjunctivae and EOM are normal.  Neck: Normal range of motion. Neck supple. Carotid bruit is not present.  Cardiovascular: Normal rate, regular rhythm and normal heart sounds.  No murmur heard. Pulmonary/Chest: Effort normal and breath sounds normal. She exhibits no mass. Right breast exhibits no mass, no skin change and no tenderness. Left breast exhibits no mass, no skin change and no tenderness. Breasts are symmetrical.  Abdominal: Soft. Bowel sounds are normal. There is no hepatosplenomegaly.  Musculoskeletal: Normal range of motion.  Neurological: She is alert and oriented to person, place, and time.  Skin: No rash noted.  Psychiatric: She has a normal mood and affect. Her behavior is normal. Judgment and thought content normal.    Results for orders placed or performed in visit on 12/28/17  Basic metabolic panel  Result Value Ref Range   Glucose 106 (H) 65 - 99 mg/dL   BUN 13 8 - 27 mg/dL   Creatinine, Ser 3.29 0.57 - 1.00 mg/dL   GFR calc non Af Amer 72 >59 mL/min/1.73   GFR calc Af Amer 83 >59  mL/min/1.73   BUN/Creatinine Ratio 16 12 - 28   Sodium 136 134 - 144 mmol/L   Potassium 3.7 3.5 - 5.2 mmol/L   Chloride 95 (L) 96 - 106 mmol/L   CO2 26 20 - 29 mmol/L   Calcium 10.3 8.7 - 10.3 mg/dL  LP+ALT+AST Piccolo, Waived  Result Value Ref Range   ALT (SGPT) Piccolo, Waived 22 10 - 47 U/L   AST (SGOT) Piccolo, Waived 24 11 - 38 U/L   Cholesterol Piccolo, Waived 206 (H) <200 mg/dL   HDL Chol Piccolo, Waived 46 (L) >59 mg/dL   Triglycerides Piccolo,Waived 136 <150 mg/dL   Chol/HDL Ratio Piccolo,Waive 4.4 mg/dL   LDL Chol Calc Piccolo Waived 132 (H) <100 mg/dL   VLDL Chol Calc Piccolo,Waive 27 <30 mg/dL      Assessment & Plan:   Problem List  Items Addressed This Visit      Cardiovascular and Mediastinum   Coronary artery disease involving native heart (Chronic)    The current medical regimen is effective;  continue present plan and medications.       Relevant Medications   metoprolol tartrate (LOPRESSOR) 50 MG tablet   lovastatin (MEVACOR) 20 MG tablet   losartan (COZAAR) 100 MG tablet   hydrochlorothiazide (HYDRODIURIL) 25 MG tablet   clopidogrel (PLAVIX) 75 MG tablet   amLODipine (NORVASC) 10 MG tablet   Other Relevant Orders   CBC with Differential/Platelet   Comprehensive metabolic panel   TSH   Lipid panel   Essential hypertension    Poor control as usual with possible compliance issues      Relevant Medications   metoprolol tartrate (LOPRESSOR) 50 MG tablet   lovastatin (MEVACOR) 20 MG tablet   losartan (COZAAR) 100 MG tablet   hydrochlorothiazide (HYDRODIURIL) 25 MG tablet   amLODipine (NORVASC) 10 MG tablet   Other Relevant Orders   CBC with Differential/Platelet   Comprehensive metabolic panel   TSH   Urinalysis, Routine w reflex microscopic   Acute on chronic diastolic heart failure (HCC)    The current medical regimen is effective;  continue present plan and medications.       Relevant Medications   metoprolol tartrate (LOPRESSOR) 50 MG tablet   lovastatin (MEVACOR) 20 MG tablet   losartan (COZAAR) 100 MG tablet   hydrochlorothiazide (HYDRODIURIL) 25 MG tablet   amLODipine (NORVASC) 10 MG tablet   Other Relevant Orders   TSH   Urinalysis, Routine w reflex microscopic     Other   Hyperlipidemia    The current medical regimen is effective;  continue present plan and medications.       Relevant Medications   metoprolol tartrate (LOPRESSOR) 50 MG tablet   lovastatin (MEVACOR) 20 MG tablet   losartan (COZAAR) 100 MG tablet   hydrochlorothiazide (HYDRODIURIL) 25 MG tablet   amLODipine (NORVASC) 10 MG tablet   Other Relevant Orders   TSH   Noncompliance with medication regimen     Patient is a little better with taking her medications      Advanced care planning/counseling discussion    A voluntary discussion about advanced care planning including explanation and discussion of advanced directives was extentively discussed with the patient.  Explained about the healthcare proxy and living will was reviewed and packet with forms with expiration of how to fill them out was given.  Time spent: Encounter 16+ min individuals present: Patient      Morbid obesity (HCC)    Discussed weight loss  Relevant Orders   TSH    Other Visit Diagnoses    Need for influenza vaccination    -  Primary   Relevant Orders   Flu vaccine HIGH DOSE PF (Completed)       Follow up plan: Return in about 6 months (around 02/08/2019) for BMP,  Lipids, ALT, AST.

## 2018-08-10 ENCOUNTER — Encounter: Payer: Self-pay | Admitting: Family Medicine

## 2018-08-10 LAB — COMPREHENSIVE METABOLIC PANEL
ALT: 17 IU/L (ref 0–32)
AST: 24 IU/L (ref 0–40)
Albumin/Globulin Ratio: 1.2 (ref 1.2–2.2)
Albumin: 4.4 g/dL (ref 3.6–4.8)
Alkaline Phosphatase: 79 IU/L (ref 39–117)
BILIRUBIN TOTAL: 0.4 mg/dL (ref 0.0–1.2)
BUN / CREAT RATIO: 18 (ref 12–28)
BUN: 12 mg/dL (ref 8–27)
CALCIUM: 10.2 mg/dL (ref 8.7–10.3)
CHLORIDE: 97 mmol/L (ref 96–106)
CO2: 26 mmol/L (ref 20–29)
CREATININE: 0.67 mg/dL (ref 0.57–1.00)
GFR calc non Af Amer: 90 mL/min/{1.73_m2} (ref >59)
GFR, EST AFRICAN AMERICAN: 104 mL/min/{1.73_m2} (ref >59)
GLOBULIN, TOTAL: 3.7 g/dL (ref 1.5–4.5)
Glucose: 82 mg/dL (ref 65–99)
Potassium: 3.7 mmol/L (ref 3.5–5.2)
Sodium: 138 mmol/L (ref 134–144)
Total Protein: 8.1 g/dL (ref 6.0–8.5)

## 2018-08-10 LAB — LIPID PANEL
CHOL/HDL RATIO: 4.5 ratio — AB (ref 0.0–4.4)
Cholesterol, Total: 210 mg/dL — ABNORMAL HIGH (ref 100–199)
HDL: 47 mg/dL (ref >39)
LDL CALC: 142 mg/dL — AB (ref 0–99)
Triglycerides: 104 mg/dL (ref 0–149)
VLDL Cholesterol Cal: 21 mg/dL (ref 5–40)

## 2018-08-10 LAB — CBC WITH DIFFERENTIAL/PLATELET
Basophils Absolute: 0 10*3/uL (ref 0.0–0.2)
Basos: 1 %
EOS (ABSOLUTE): 0.3 10*3/uL (ref 0.0–0.4)
Eos: 4 %
HEMATOCRIT: 43.2 % (ref 34.0–46.6)
Hemoglobin: 14.2 g/dL (ref 11.1–15.9)
Immature Grans (Abs): 0 10*3/uL (ref 0.0–0.1)
Immature Granulocytes: 0 %
Lymphocytes Absolute: 2.9 10*3/uL (ref 0.7–3.1)
Lymphs: 49 %
MCH: 26.1 pg — ABNORMAL LOW (ref 26.6–33.0)
MCHC: 32.9 g/dL (ref 31.5–35.7)
MCV: 79 fL (ref 79–97)
Monocytes Absolute: 0.4 10*3/uL (ref 0.1–0.9)
Monocytes: 6 %
Neutrophils Absolute: 2.4 10*3/uL (ref 1.4–7.0)
Neutrophils: 40 %
Platelets: 256 10*3/uL (ref 150–450)
RBC: 5.45 x10E6/uL — AB (ref 3.77–5.28)
RDW: 14.6 % (ref 12.3–15.4)
WBC: 6 10*3/uL (ref 3.4–10.8)

## 2018-08-10 LAB — TSH: TSH: 3.32 u[IU]/mL (ref 0.450–4.500)

## 2019-02-13 ENCOUNTER — Ambulatory Visit (INDEPENDENT_AMBULATORY_CARE_PROVIDER_SITE_OTHER): Payer: Medicare HMO | Admitting: Family Medicine

## 2019-02-13 ENCOUNTER — Other Ambulatory Visit: Payer: Self-pay

## 2019-02-13 ENCOUNTER — Encounter: Payer: Self-pay | Admitting: Family Medicine

## 2019-02-13 DIAGNOSIS — I5033 Acute on chronic diastolic (congestive) heart failure: Secondary | ICD-10-CM

## 2019-02-13 DIAGNOSIS — E785 Hyperlipidemia, unspecified: Secondary | ICD-10-CM | POA: Diagnosis not present

## 2019-02-13 DIAGNOSIS — I10 Essential (primary) hypertension: Secondary | ICD-10-CM | POA: Diagnosis not present

## 2019-02-13 NOTE — Assessment & Plan Note (Signed)
The current medical regimen is effective;  continue present plan and medications.  

## 2019-02-13 NOTE — Progress Notes (Signed)
LMP  (LMP Unknown)    Subjective:    Patient ID: Jennifer Grant, female    DOB: 1951/07/13, 68 y.o.   MRN: [id number]  HPI: Jennifer Grant is a 68 y.o. female  Med check  Phone call Discussed with patient blood pressure doing well no complaints taking medications faithfully without side effects same for cholesterol medicine and reflux. No complaints of heart failure shortness of breath symptoms or edema.   Relevant past medical, surgical, family and social history reviewed and updated as indicated. Interim medical history since our last visit reviewed. Allergies and medications reviewed and updated.  Review of Systems  Constitutional: Negative.   Respiratory: Negative.   Cardiovascular: Negative.     Per HPI unless specifically indicated above     Objective:    LMP  (LMP Unknown)   Wt Readings from Last 3 Encounters:  08/09/18 227 lb 12.8 oz (103.3 kg)  04/18/18 223 lb 4.8 oz (101.3 kg)  04/14/18 224 lb 11.2 oz (101.9 kg)    Physical Exam  Results for orders placed or performed in visit on 08/09/18  CBC with Differential/Platelet  Result Value Ref Range   WBC 6.0 3.4 - 10.8 x10E3/uL   RBC 5.45 (H) 3.77 - 5.28 x10E6/uL   Hemoglobin 14.2 11.1 - 15.9 g/dL   Hematocrit 43.2 34.0 - 46.6 %   MCV 79 79 - 97 fL   MCH 26.1 (L) 26.6 - 33.0 pg   MCHC 32.9 31.5 - 35.7 g/dL   RDW 14.6 12.3 - 15.4 %   Platelets 256 150 - 450 x10E3/uL   Neutrophils 40 Not Estab. %   Lymphs 49 Not Estab. %   Monocytes 6 Not Estab. %   Eos 4 Not Estab. %   Basos 1 Not Estab. %   Neutrophils Absolute 2.4 1.4 - 7.0 x10E3/uL   Lymphocytes Absolute 2.9 0.7 - 3.1 x10E3/uL   Monocytes Absolute 0.4 0.1 - 0.9 x10E3/uL   EOS (ABSOLUTE) 0.3 0.0 - 0.4 x10E3/uL   Basophils Absolute 0.0 0.0 - 0.2 x10E3/uL   Immature Granulocytes 0 Not Estab. %   Immature Grans (Abs) 0.0 0.0 - 0.1 x10E3/uL  Comprehensive metabolic panel  Result Value Ref Range   Glucose 82 65 - 99 mg/dL   BUN 12 8 - 27 mg/dL   Creatinine, Ser 0.67 0.57 - 1.00 mg/dL   GFR calc non Af Amer 90 >59 mL/min/1.73   GFR calc Af Amer 104 >59 mL/min/1.73   BUN/Creatinine Ratio 18 12 - 28   Sodium 138 134 - 144 mmol/L   Potassium 3.7 3.5 - 5.2 mmol/L   Chloride 97 96 - 106 mmol/L   CO2 26 20 - 29 mmol/L   Calcium 10.2 8.7 - 10.3 mg/dL   Total Protein 8.1 6.0 - 8.5 g/dL   Albumin 4.4 3.6 - 4.8 g/dL   Globulin, Total 3.7 1.5 - 4.5 g/dL   Albumin/Globulin Ratio 1.2 1.2 - 2.2   Bilirubin Total 0.4 0.0 - 1.2 mg/dL   Alkaline Phosphatase 79 39 - 117 IU/L   AST 24 0 - 40 IU/L   ALT 17 0 - 32 IU/L  TSH  Result Value Ref Range   TSH 3.320 0.450 - 4.500 uIU/mL  Lipid panel  Result Value Ref Range   Cholesterol, Total 210 (H) 100 - 199 mg/dL   Triglycerides 104 0 - 149 mg/dL   HDL 47 >39 mg/dL   VLDL Cholesterol Cal 21 5 - 40 mg/dL  LDL Calculated 142 (H) 0 - 99 mg/dL   Chol/HDL Ratio 4.5 (H) 0.0 - 4.4 ratio  Urinalysis, Routine w reflex microscopic  Result Value Ref Range   Specific Gravity, UA 1.010 1.005 - 1.030   pH, UA 7.0 5.0 - 7.5   Color, UA Yellow Yellow   Appearance Ur Clear Clear   Leukocytes, UA Trace (A) Negative   Protein, UA Negative Negative/Trace   Glucose, UA Negative Negative   Ketones, UA Negative Negative   RBC, UA Trace (A) Negative   Bilirubin, UA Negative Negative   Urobilinogen, Ur 0.2 0.2 - 1.0 mg/dL   Nitrite, UA Negative Negative      Assessment & Plan:   Problem List Items Addressed This Visit      Cardiovascular and Mediastinum   Essential hypertension    The current medical regimen is effective;  continue present plan and medications.       Relevant Orders   Basic metabolic panel   Acute on chronic diastolic heart failure (HCC)    Stable         Other   Hyperlipidemia    The current medical regimen is effective;  continue present plan and medications.       Relevant Orders   LP+ALT+AST Piccolo, Arrow [employer]Waived      Telemedicine using audio/video telecommunications  for a synchronous communication visit. Today's visit due to COVID-19 isolation precautions I connected with and verified that I am speaking with the correct person using two identifiers.   I discussed the limitations, risks, security and privacy concerns of performing an evaluation and management service by telecommunication and the availability of in person appointments. I also discussed with the patient that there may be a patient responsible charge related to this service. The patient expressed understanding and agreed to proceed. The patient's location is home. I am at home.   I discussed the assessment and treatment plan with the patient. The patient was provided an opportunity to ask questions and all were answered. The patient agreed with the plan and demonstrated an understanding of the instructions.   The patient was advised to call back or seek an in-person evaluation if the symptoms worsen or if the condition fails to improve as anticipated.   I provided 21+ minutes of time during this encounter. Follow up plan: Return in about 6 months (around 08/15/2019) for Physical Exam.

## 2019-02-13 NOTE — Assessment & Plan Note (Signed)
Stable

## 2019-04-19 ENCOUNTER — Ambulatory Visit (INDEPENDENT_AMBULATORY_CARE_PROVIDER_SITE_OTHER): Payer: Medicare HMO

## 2019-04-19 DIAGNOSIS — Z0000 Encounter for general adult medical examination without abnormal findings: Secondary | ICD-10-CM | POA: Diagnosis not present

## 2019-04-19 DIAGNOSIS — Z1239 Encounter for other screening for malignant neoplasm of breast: Secondary | ICD-10-CM | POA: Diagnosis not present

## 2019-04-19 NOTE — Patient Instructions (Addendum)
[name] , Thank you for taking time to come for your Medicare Wellness Visit. I appreciate your ongoing commitment to your health goals. Please review the following plan we discussed and let me know if I can assist you in the future.   Screening recommendations/referrals: Colonoscopy: completed 07/18/2014, call [phone] to schedule  Mammogram: Please call [phone] to schedule your mammogram.  Bone Density: completed  Recommended yearly ophthalmology/optometry visit for glaucoma screening and checkup Recommended yearly dental visit for hygiene and checkup  Vaccinations: Influenza vaccine: due 05/2019 Pneumococcal vaccine: up to date Tdap vaccine: up to date Shingles vaccine: shingrix eligible, check with your insurance for coverage     Advanced directives: Please bring a copy of your health care power of attorney and living will to the office at your convenience.  Conditions/risks identified: discussed meal planning.   Next appointment: Follow up in one year for your annual wellness visit    Preventive Care 65 Years and Older, Female Preventive care refers to lifestyle choices and visits with your health care provider that can promote health and wellness. What does preventive care include?  A yearly physical exam. This is also called an annual well check.  Dental exams once or twice a year.  Routine eye exams. Ask your health care provider how often you should have your eyes checked.  Personal lifestyle choices, including:  Daily care of your teeth and gums.  Regular physical activity.  Eating a healthy diet.  Avoiding tobacco and drug use.  Limiting alcohol use.  Practicing safe sex.  Taking low-dose aspirin every day.  Taking vitamin and mineral supplements as recommended by your health care provider. What happens during an annual well check? The services and screenings done by your health care provider during your annual well check will depend on  your age, overall health, lifestyle risk factors, and family history of disease. Counseling  Your health care provider may ask you questions about your:  Alcohol use.  Tobacco use.  Drug use.  Emotional well-being.  Home and relationship well-being.  Sexual activity.  Eating habits.  History of falls.  Memory and ability to understand (cognition).  Work and work Statistician.  Reproductive health. Screening  You may have the following tests or measurements:  Height, weight, and BMI.  Blood pressure.  Lipid and cholesterol levels. These may be checked every 5 years, or more frequently if you are over 69 years old.  Skin check.  Lung cancer screening. You may have this screening every year starting at age 23 if you have a 30-pack-year history of smoking and currently smoke or have quit within the past 15 years.  Fecal occult blood test (FOBT) of the stool. You may have this test every year starting at age 64.  Flexible sigmoidoscopy or colonoscopy. You may have a sigmoidoscopy every 5 years or a colonoscopy every 10 years starting at age 78.  Hepatitis C blood test.  Hepatitis B blood test.  Sexually transmitted disease (STD) testing.  Diabetes screening. This is done by checking your blood sugar (glucose) after you have not eaten for a while (fasting). You may have this done every 1-3 years.  Bone density scan. This is done to screen for osteoporosis. You may have this done starting at age 79.  Mammogram. This may be done every 1-2 years. Talk to your health care provider about how often you should have regular mammograms. Talk with your health care provider about your test results, treatment options, and if  necessary, the need for more tests. Vaccines  Your health care provider may recommend certain vaccines, such as:  Influenza vaccine. This is recommended every year.  Tetanus, diphtheria, and acellular pertussis (Tdap, Td) vaccine. You may need a Td booster  every 10 years.  Zoster vaccine. You may need this after age 75.  Pneumococcal 13-valent conjugate (PCV13) vaccine. One dose is recommended after age 33.  Pneumococcal polysaccharide (PPSV23) vaccine. One dose is recommended after age 12. Talk to your health care provider about which screenings and vaccines you need and how often you need them. This information is not intended to replace advice given to you by your health care provider. Make sure you discuss any questions you have with your health care provider. Document Released: 09/12/2015 Document Revised: 05/05/2016 Document Reviewed: 06/17/2015 Elsevier Interactive Patient Education  2017 [place] Prevention in the Home Falls can cause injuries. They can happen to people of all ages. There are many things you can do to make your home safe and to help prevent falls. What can I do on the outside of my home?  Regularly fix the edges of walkways and driveways and fix any cracks.  Remove anything that might make you trip as you walk through a door, such as a raised step or threshold.  Trim any bushes or trees on the path to your home.  Use bright outdoor lighting.  Clear any walking paths of anything that might make someone trip, such as rocks or tools.  Regularly check to see if handrails are loose or broken. Make sure that both sides of any steps have handrails.  Any raised decks and porches should have guardrails on the edges.  Have any leaves, snow, or ice cleared regularly.  Use sand or salt on walking paths during winter.  Clean up any spills in your garage right away. This includes oil or grease spills. What can I do in the bathroom?  Use night lights.  Install grab bars by the toilet and in the tub and shower. Do not use towel bars as grab bars.  Use non-skid mats or decals in the tub or shower.  If you need to sit down in the shower, use a plastic, non-slip stool.  Keep the floor dry. Clean up any  water that spills on the floor as soon as it happens.  Remove soap buildup in the tub or shower regularly.  Attach bath mats securely with double-sided non-slip rug tape.  Do not have throw rugs and other things on the floor that can make you trip. What can I do in the bedroom?  Use night lights.  Make sure that you have a light by your bed that is easy to reach.  Do not use any sheets or blankets that are too big for your bed. They should not hang down onto the floor.  Have a firm chair that has side arms. You can use this for support while you get dressed.  Do not have throw rugs and other things on the floor that can make you trip. What can I do in the kitchen?  Clean up any spills right away.  Avoid walking on wet floors.  Keep items that you use a lot in easy-to-reach places.  If you need to reach something above you, use a strong step stool that has a grab bar.  Keep electrical cords out of the way.  Do not use floor polish or wax that makes floors slippery. If you must  use wax, use non-skid floor wax.  Do not have throw rugs and other things on the floor that can make you trip. What can I do with my stairs?  Do not leave any items on the stairs.  Make sure that there are handrails on both sides of the stairs and use them. Fix handrails that are broken or loose. Make sure that handrails are as long as the stairways.  Check any carpeting to make sure that it is firmly attached to the stairs. Fix any carpet that is loose or worn.  Avoid having throw rugs at the top or bottom of the stairs. If you do have throw rugs, attach them to the floor with carpet tape.  Make sure that you have a light switch at the top of the stairs and the bottom of the stairs. If you do not have them, ask someone to add them for you. What else can I do to help prevent falls?  Wear shoes that:  Do not have high heels.  Have rubber bottoms.  Are comfortable and fit you well.  Are closed  at the toe. Do not wear sandals.  If you use a stepladder:  Make sure that it is fully opened. Do not climb a closed stepladder.  Make sure that both sides of the stepladder are locked into place.  Ask someone to hold it for you, if possible.  Clearly mark and make sure that you can see:  Any grab bars or handrails.  First and last steps.  Where the edge of each step is.  Use tools that help you move around (mobility aids) if they are needed. These include:  Canes.  Walkers.  Scooters.  Crutches.  Turn on the lights when you go into a dark area. Replace any light bulbs as soon as they burn out.  Set up your furniture so you have a clear path. Avoid moving your furniture around.  If any of your floors are uneven, fix them.  If there are any pets around you, be aware of where they are.  Review your medicines with your doctor. Some medicines can make you feel dizzy. This can increase your chance of falling. Ask your doctor what other things that you can do to help prevent falls. This information is not intended to replace advice given to you by your health care provider. Make sure you discuss any questions you have with your health care provider. Document Released: 06/12/2009 Document Revised: 01/22/2016 Document Reviewed: 09/20/2014 Elsevier Interactive Patient Education  2017 [employer].

## 2019-04-19 NOTE — Progress Notes (Signed)
Subjective:   Jennifer Grant is a 68 y.o. female who presents for Medicare Annual (Subsequent) preventive examination.  This visit is being conducted via phone call  - after an attmept to do on video chat - due to the COVID-19 pandemic. This patient has given me verbal consent via phone to conduct this visit, patient states they are participating from their home address. Some vital signs may be absent or patient reported.   Patient identification: identified by name, DOB, and current address.    Review of Systems:   Cardiac Risk Factors include: advanced age (>8155men, 74>65 women);hypertension;dyslipidemia     Objective:     Vitals: LMP  (LMP Unknown)   There is no height or weight on file to calculate BMI.  Advanced Directives 04/19/2019 04/14/2018 03/11/2017 07/16/2015  Does Patient Have a Medical Advance Directive? No No No No  Would patient like information on creating a medical advance directive? - No - Patient declined Yes (MAU/Ambulatory/Procedural Areas - Information given) No - patient declined information    Tobacco Social History   Tobacco Use  Smoking Status Never Smoker  Smokeless Tobacco Never Used     Counseling given: Not Answered   Clinical Intake:  Pre-visit preparation completed: Yes  Pain : No/denies pain     Nutritional Risks: None Diabetes: No  How often do you need to have someone help you when you read instructions, pamphlets, or other written materials from your doctor or pharmacy?: 1 - Never  Interpreter Needed?: No  Information entered by :: Shemeka Wardle,LPN  Past Medical History:  Diagnosis Date  . CAD (coronary artery disease)    a. 2004 s/p MI w/ PCI/stenting to the LCX, OM1, OM2, RCA, RPDA; b. 05/2015 MI/PCI: 100 OM1 (2.75x20 & 3.0x8 Promus Premier DES'), PTCA OM2; c. 07/2015 Cath/Staged PCI: LM 60, LAD 75d, LCX 5741m ISR, OM1 ok, OM2 25 ISR, RCA 50p, 3725m/d, 85d (3.0x28 Promus Premier DES).  . Diastolic dysfunction    a. 05/2015  Echo: EF 50-55%, severe lateral HK. Gr1 DD, mildly dil LA. Mildly reduced RV fxn.  . 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    "got booster shot a few times"  . GERD (gastroesophageal reflux disease)   . Hyperlipidemia   . Hypertensive heart disease   . Menopausal state   . Morbid obesity (HCC)   . Noncompliance    Past Surgical History:  Procedure Laterality Date  . CARDIAC CATHETERIZATION N/A 06/08/2015   Procedure: Left Heart Cath and Coronary Angiography;  Surgeon: Lyn RecordsHenry W Smith, MD;  [facility];  Service: Cardiovascular;  Laterality: N/A;  . CARDIAC CATHETERIZATION N/A 06/08/2015   Procedure: Coronary Stent Intervention;  Surgeon: Lyn RecordsHenry W Smith, MD;  [facility];  Service: Cardiovascular;  Laterality: N/A;  . CARDIAC CATHETERIZATION N/A 07/16/2015   Procedure: Coronary Stent Intervention;  Surgeon: Lyn RecordsHenry W Smith, MD;  [facility];  Service: Cardiovascular;  Laterality: N/A;  . CORONARY ANGIOPLASTY WITH STENT PLACEMENT     "6 stents before 05/2015"  . KNEE ARTHROSCOPY Left 1990's  . TUBAL LIGATION  ~ 1978   Family History  Problem Relation Age of Onset  . Healthy Mother   . Prostate cancer Father   . Kidney disease Father   . Healthy Brother   . Cancer Neg Hx   . Diabetes Neg Hx   . Heart disease Neg Hx    Social History   Socioeconomic History  . Marital status: Widowed  Spouse name: Not on file  . Number of children: Not on file  . Years of education: Not on file  . Highest education level: 11th grade  Occupational History  . Occupation: retired  Scientific laboratory technician  . Financial resource strain: Not hard at all  . Food insecurity    Worry: Never true    Inability: Never true  . Transportation needs    Medical: No    Non-medical: No  Tobacco Use  . Smoking status: Never Smoker  . Smokeless tobacco: Never Used  Substance and Sexual Activity  . Alcohol use: No  . Drug use: No  . Sexual activity: Never    Birth control/protection:  Surgical  Lifestyle  . Physical activity    Days per week: 0 days    Minutes per session: 0 min  . Stress: Not at all  Relationships  . Social connections    Talks on phone: More than three times a week    Gets together: More than three times a week    Attends religious service: More than 4 times per year    Active member of club or organization: No    Attends meetings of clubs or organizations: Never    Relationship status: Widowed  Other Topics Concern  . Not on file  Social History Narrative  . Not on file    Outpatient Encounter Medications as of 04/19/2019  Medication Sig  . amLODipine (NORVASC) 10 MG tablet Take 1 tablet (10 mg total) by mouth daily.  Marland Kitchen aspirin EC 81 MG tablet Take 81 mg by mouth daily.  . clopidogrel (PLAVIX) 75 MG tablet Take 1 tablet (75 mg total) by mouth daily.  . hydrochlorothiazide (HYDRODIURIL) 25 MG tablet Take 1 tablet (25 mg total) by mouth daily.  Marland Kitchen losartan (COZAAR) 100 MG tablet Take 1 tablet (100 mg total) by mouth daily.  Marland Kitchen lovastatin (MEVACOR) 20 MG tablet Take 1 tablet (20 mg total) by mouth at bedtime.  . metoprolol tartrate (LOPRESSOR) 50 MG tablet Take 1 tablet (50 mg total) by mouth 2 (two) times daily.  . potassium chloride SA (K-DUR,KLOR-CON) 20 MEQ tablet Take 1 tablet (20 mEq total) by mouth daily.  . Infant Care Products (DERMACLOUD) CREA Apply 1 application topically daily. (Patient not taking: Reported on 04/19/2019)  . isosorbide mononitrate (IMDUR) 60 MG 24 hr tablet TAKE ONE TABLET BY MOUTH ONCE DAILY (Patient not taking: Reported on 04/19/2019)  . nitroGLYCERIN (NITROSTAT) 0.4 MG SL tablet Place 1 tablet (0.4 mg total) under the tongue every 5 (five) minutes x 3 doses as needed for chest pain. (Patient not taking: Reported on 04/19/2019)   No facility-administered encounter medications on file as of 04/19/2019.     Activities of Daily Living In your present state of health, do you have any difficulty performing the following  activities: 04/19/2019 08/09/2018  Hearing? N N  Comment no hearing aids -  Vision? N N  Comment no eyeglasses, no eye dr -  Difficulty concentrating or making decisions? N N  Walking or climbing stairs? N N  Dressing or bathing? N N  Doing errands, shopping? N N  Preparing Food and eating ? N -  Using the Toilet? N -  In the past six months, have you accidently leaked urine? N -  Do you have problems with loss of bowel control? N -  Managing your Medications? N -  Managing your Finances? N -  Housekeeping or managing your Housekeeping? N -  Some recent  data might be hidden    Patient Care Team: Steele Sizerrissman, Mark A, MD as PCP - General (Family Medicine) End, Cristal Deerhristopher, MD as PCP - Cardiology (Cardiology) Lyn RecordsSmith, Henry W, MD as Consulting Physician (Cardiology)    Assessment:   This is a routine wellness examination for Jennifer Grant.  Exercise Activities and Dietary recommendations Current Exercise Habits: The patient does not participate in regular exercise at present, Exercise limited by: None identified  Goals    . Increase water intake     Recommend drinking at least 6-8 glasses of water a day       Fall Risk: Fall Risk  04/19/2019 08/09/2018 04/14/2018 12/28/2017 12/28/2017  Falls in the past year? 0 0 No No No  Number falls in past yr: - 0 - - -  Injury with Fall? - 0 - - -  Follow up - - - - -    FALL RISK PREVENTION PERTAINING TO THE HOME:  Any stairs in or around the home? Yes  If so, are there any without handrails? No   Home free of loose throw rugs in walkways, pet beds, electrical cords, etc? Yes  Adequate lighting in your home to reduce risk of falls? Yes   ASSISTIVE DEVICES UTILIZED TO PREVENT FALLS:  Life alert? No  Use of a cane, walker or w/c? No  Grab bars in the bathroom? No  Shower chair or bench in shower? no Elevated toilet seat or a handicapped toilet? no  DME ORDERS:  DME order needed?  No   TIMED UP AND GO:  Unable to perform     Depression Screen PHQ 2/9 Scores 04/19/2019 08/09/2018 04/14/2018 12/28/2017  PHQ - 2 Score 0 0 0 0  PHQ- 9 Score - 0 - -     Cognitive Function     6CIT Screen 04/14/2018 03/11/2017  What Year? 0 points 0 points  What month? 0 points 0 points  What time? 0 points 0 points  Count back from 20 0 points 0 points  Months in reverse 0 points 0 points  Repeat phrase 0 points 0 points  Total Score 0 0    Immunization History  Administered Date(s) Administered  . Influenza, High Dose Seasonal PF 08/09/2018  . Influenza,inj,Quad PF,6+ Mos 06/18/2015  . Influenza-Unspecified 08/12/2014  . Pneumococcal Conjugate-13 08/12/2014  . Pneumococcal Polysaccharide-23 03/04/2010, 03/24/2016  . Td 08/30/2004  . Tdap 03/24/2016    Qualifies for Shingles Vaccine? Yes  Zostavax completed n/a. Due for Shingrix. Education has been provided regarding the importance of this vaccine. Pt has been advised to call insurance company to determine out of pocket expense. Advised may also receive vaccine at local pharmacy or Health Dept. Verbalized acceptance and understanding.  Tdap: up to date  Flu Vaccine: Due 05/2019  Pneumococcal Vaccine: up to date   Screening Tests Health Maintenance  Topic Date Due  . MAMMOGRAM  01/12/2019  . INFLUENZA VACCINE  03/31/2019  . COLONOSCOPY  07/19/2019  . TETANUS/TDAP  03/24/2026  . DEXA SCAN  Completed  . Hepatitis C Screening  Completed  . PNA vac Low Risk Adult  Completed    Cancer Screenings:  Colorectal Screening: Completed 07/18/2014. Repeat every 5 years  Mammogram: ordered  Bone Density: Completed 11/26/2010  Lung Cancer Screening: (Low Dose CT Chest recommended if Age 34-80 years, 30 pack-year currently smoking OR have quit w/in 15years.) does not qualify.    Additional Screening:  Hepatitis C Screening: does qualify; Completed 11/04/2015  Vision Screening: Recommended annual ophthalmology  exams for early detection of glaucoma and other disorders of  the eye. Is the patient up to date with their annual eye exam?  no   Dental Screening: Recommended annual dental exams for proper oral hygiene  Community Resource Referral:  CRR required this visit?  Yes   Needs shower chair and elevated toilet seat. Wants to discuss meals on wheels and [employer] insurance medication discount card.      Plan:  I have personally reviewed and addressed the Medicare Annual Wellness questionnaire and have noted the following in the patient's chart:  A. Medical and social history B. Use of alcohol, tobacco or illicit drugs  C. Current medications and supplements D. Functional ability and status E.  Nutritional status F.  Physical activity G. Advance directives H. List of other physicians I.  Hospitalizations, surgeries, and ER visits in previous 12 months J.  Vitals K. Screenings such as hearing and vision if needed, cognitive and depression L. Referrals and appointments   In addition, I have reviewed and discussed with patient certain preventive protocols, quality metrics, and best practice recommendations. A written personalized care plan for preventive services as well as general preventive health recommendations were provided to patient.  Signed,    Collene SchlichterHill, Amry Cathy A, LPN  1/[id number]/20/2020 Nurse Health Advisor   Nurse Notes: none

## 2019-06-13 ENCOUNTER — Telehealth: Payer: Self-pay

## 2019-06-13 DIAGNOSIS — Z1211 Encounter for screening for malignant neoplasm of colon: Secondary | ICD-10-CM

## 2019-06-13 NOTE — Telephone Encounter (Signed)
Called and spoke  with patient. She stated that she wants a colonoscopy done and let her know that she is due for one next month. Also, patient wants to know if she is due for a physical since she already had a wellness. Copied from [place] [id number]. Topic: Appointment Scheduling - Scheduling Inquiry for Clinic >> Jun 12, 2019 11:35 AM Alease Frame wrote: Reason for CRM: Patient called in to possibly get information to get a colposcopy appt.  Please advise

## 2019-06-14 NOTE — Telephone Encounter (Signed)
OK to schedule for physical. Can we find out who did her previous colonoscopy- I'll put in the referral.

## 2019-06-15 ENCOUNTER — Other Ambulatory Visit: Payer: Self-pay | Admitting: Family Medicine

## 2019-06-15 DIAGNOSIS — I2510 Atherosclerotic heart disease of native coronary artery without angina pectoris: Secondary | ICD-10-CM

## 2019-06-18 ENCOUNTER — Telehealth: Payer: Self-pay

## 2019-06-18 ENCOUNTER — Other Ambulatory Visit: Payer: Self-pay | Admitting: Family Medicine

## 2019-06-18 MED ORDER — ISOSORBIDE MONONITRATE ER 60 MG PO TB24: 60.0000 mg | ORAL_TABLET | Freq: Every day | ORAL | 0 refills | Status: DC

## 2019-06-18 NOTE — Telephone Encounter (Signed)
Patient's last visit was 02/13/19.

## 2019-06-19 NOTE — Addendum Note (Signed)
Addended by: Dannell Roys on: 06/19/2019 10:03 AM   Modules accepted: Orders

## 2019-06-19 NOTE — Telephone Encounter (Signed)
Appointment is already scheduled in January. Last colonoscopy was done in November 2015 by Dr. Allen Norris.

## 2019-06-21 ENCOUNTER — Other Ambulatory Visit: Payer: Self-pay

## 2019-06-21 ENCOUNTER — Telehealth: Payer: Self-pay

## 2019-06-21 DIAGNOSIS — Z1211 Encounter for screening for malignant neoplasm of colon: Secondary | ICD-10-CM

## 2019-06-21 NOTE — Telephone Encounter (Signed)
Gastroenterology Pre-Procedure Review  Request Date: Tuesday 07/24/19 Requesting Physician: Dr. Allen Norris  PATIENT REVIEW QUESTIONS: The patient responded to the following health history questions as indicated:    1. Are you having any GI issues? yes (left side pain, change in bowel habits (Not Regular)) (office visit scheduled with Dr. Allen Norris 07/11/19 at 9:30am 2. Do you have a personal history of Polyps? no 3. Do you have a family history of Colon Cancer or Polyps? no 4. Diabetes Mellitus? no 5. Joint replacements in the past 12 months?no 6. Major health problems in the past 3 months?no 7. Any artificial heart valves, MVP, or defibrillator?no    MEDICATIONS & ALLERGIES:    Patient reports the following regarding taking any anticoagulation/antiplatelet therapy:   Plavix, Coumadin, Eliquis, Xarelto, Lovenox, Pradaxa, Brilinta, or Effient? yes (Plavix 75 mg daily request sent to PCP) Aspirin? yes (81 mg daily)  Patient confirms/reports the following medications:  Current Outpatient Medications  Medication Sig Dispense Refill  . amLODipine (NORVASC) 10 MG tablet Take 1 tablet (10 mg total) by mouth daily. 90 tablet 4  . aspirin EC 81 MG tablet Take 81 mg by mouth daily.    . clopidogrel (PLAVIX) 75 MG tablet Take 1 tablet (75 mg total) by mouth daily. 90 tablet 4  . hydrochlorothiazide (HYDRODIURIL) 25 MG tablet Take 1 tablet (25 mg total) by mouth daily. 90 tablet 4  . Infant Care Products (DERMACLOUD) CREA Apply 1 application topically daily. (Patient not taking: Reported on 04/19/2019) 430 g 3  . isosorbide mononitrate (IMDUR) 60 MG 24 hr tablet Take 1 tablet (60 mg total) by mouth daily. 90 tablet 0  . losartan (COZAAR) 100 MG tablet Take 1 tablet (100 mg total) by mouth daily. 90 tablet 4  . lovastatin (MEVACOR) 20 MG tablet Take 1 tablet (20 mg total) by mouth at bedtime. 90 tablet 4  . metoprolol tartrate (LOPRESSOR) 50 MG tablet Take 1 tablet (50 mg total) by mouth 2 (two) times daily.  180 tablet 4  . nitroGLYCERIN (NITROSTAT) 0.4 MG SL tablet DISSOLVE ONE TABLET UNDER THE TONGUE EVERY 5 MINUTES AS NEEDED FOR CHEST PAIN.  DO NOT EXCEED A TOTAL OF 3 DOSES IN 15 MINUTES 25 tablet 0  . potassium chloride SA (K-DUR,KLOR-CON) 20 MEQ tablet Take 1 tablet (20 mEq total) by mouth daily. 90 tablet 1   No current facility-administered medications for this visit.     Patient confirms/reports the following allergies:  Allergies  Allergen Reactions  . Brilinta [Ticagrelor] Shortness Of Breath  . Lipitor [Atorvastatin] Itching    Mouth itching, cough  . Latex Other (See Comments)    Gloves make hands look black after prolonged use  . Penicillins Itching and Swelling    Tongue itching and lip swelling Has patient had a PCN reaction causing immediate rash, facial/tongue/throat swelling, SOB or lightheadedness with hypotension: yes Has patient had a PCN reaction causing severe rash involving mucus membranes or skin necrosis: No Has patient had a PCN reaction that required hospitalization No Has patient had a PCN reaction occurring within the last 10 years: No If all of the above answers are "NO", then may proceed with Cephalosporin use.  Marisa Severin [Aliskiren] Rash    No orders of the defined types were placed in this encounter.   AUTHORIZATION INFORMATION Primary Insurance: 1D#: Group #:  Secondary Insurance: 1D#: Group #:  SCHEDULE INFORMATION: Date: 07/24/19 Time: Location:[facility]

## 2019-07-02 ENCOUNTER — Telehealth: Payer: Self-pay

## 2019-07-02 NOTE — Telephone Encounter (Signed)
Received blood thinner advice from [place] office.  Patient has been advised to stop Plavix 7 days prior to procedure and restart 3 days after procedure.  Asked her to please call the office is she has any questions.  Thanks [employer]

## 2019-07-09 ENCOUNTER — Encounter: Payer: Self-pay | Admitting: Family Medicine

## 2019-07-11 ENCOUNTER — Other Ambulatory Visit: Payer: Self-pay

## 2019-07-11 ENCOUNTER — Ambulatory Visit (INDEPENDENT_AMBULATORY_CARE_PROVIDER_SITE_OTHER): Payer: Medicare HMO | Admitting: Gastroenterology

## 2019-07-11 ENCOUNTER — Encounter (INDEPENDENT_AMBULATORY_CARE_PROVIDER_SITE_OTHER): Payer: Self-pay

## 2019-07-11 ENCOUNTER — Encounter: Payer: Self-pay | Admitting: Gastroenterology

## 2019-07-11 VITALS — BP 169/88 | HR 58 | Temp 98.0°F | Ht 63.0 in | Wt 230.2 lb

## 2019-07-11 DIAGNOSIS — K5900 Constipation, unspecified: Secondary | ICD-10-CM | POA: Diagnosis not present

## 2019-07-11 DIAGNOSIS — R109 Unspecified abdominal pain: Secondary | ICD-10-CM | POA: Diagnosis not present

## 2019-07-11 NOTE — Progress Notes (Signed)
Gastroenterology Consultation  Referring Provider:     Guadalupe Maple, MD Primary Care Physician:  Guadalupe Maple, MD Primary Gastroenterologist:  Dr. Allen Norris     Reason for Consultation:     Constipation and left-sided pain        HPI:   Jennifer Grant is a 68 y.o. y/o female referred for consultation & management of constipation left-sided pain by Dr. Jeananne Rama, Jeannette How, MD.  This patient comes today with a history of having a colonoscopy by me in 2015.  The patient had contacted her primary care's office requesting a colonoscopy and it was documented that she was due for a colonoscopy at this time.  Patient reports that her left-sided pain comes when she is constipated.  The patient denies any rectal bleeding or unexplained weight loss.  She also denies any fevers chills nausea or vomiting.  There is no report of any dyspepsia.  The patient is concerned because she has to take a suppository when she does not move her bowels for 3 to 5 days.  She also reports that her left-sided pain goes into her left flank.  There is no foods that exacerbate her problems and it gets better when she does move her bowels.  There is no report of any heartburn either.  The patient does report that she has coronary artery disease and has had 8 stents placed in her heart.  Past Medical History:  Diagnosis Date  . CAD (coronary artery disease)    a. 2004 s/p MI w/ PCI/stenting to the LCX, OM1, OM2, RCA, RPDA; b. 05/2015 MI/PCI: 100 OM1 (2.75x20 & 3.0x8 Promus Premier DES'), PTCA OM2; c. 07/2015 Cath/Staged PCI: LM 60, LAD 75d, LCX 28m ISR, OM1 ok, OM2 25 ISR, RCA 50p, 57m/d, 85d (3.0x28 Promus Premier DES).  . Diastolic dysfunction    a. 05/2015 Echo: EF 50-55%, severe lateral HK. Gr1 DD, mildly dil LA. Mildly reduced RV fxn.  . 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    "got booster shot a few times"  . GERD (gastroesophageal reflux disease)   . Hyperlipidemia   . Hypertensive heart disease   . Menopausal state   . Morbid obesity  ([place])   . Noncompliance     Past Surgical History:  Procedure Laterality Date  . CARDIAC CATHETERIZATION N/A 06/08/2015   Procedure: Left Heart Cath and Coronary Angiography;  Surgeon: Belva Crome, MD;  Location: [place] [facility];  Service: Cardiovascular;  Laterality: N/A;  . CARDIAC CATHETERIZATION N/A 06/08/2015   Procedure: Coronary Stent Intervention;  Surgeon: Belva Crome, MD;  Location: [place] [facility];  Service: Cardiovascular;  Laterality: N/A;  . CARDIAC CATHETERIZATION N/A 07/16/2015   Procedure: Coronary Stent Intervention;  Surgeon: Belva Crome, MD;  Location: [facility] [facility];  Service: Cardiovascular;  Laterality: N/A;  . CORONARY ANGIOPLASTY WITH STENT PLACEMENT     "6 stents before 05/2015"  . KNEE ARTHROSCOPY Left 1990's  . TUBAL LIGATION  ~ 1978    Prior to Admission medications   Medication Sig Start Date End Date Taking? Authorizing Provider  amLODipine (NORVASC) 10 MG tablet Take 1 tablet (10 mg total) by mouth daily. 08/09/18   Guadalupe Maple, MD  aspirin EC 81 MG tablet Take 81 mg by mouth daily.    [provider]  clopidogrel (PLAVIX) 75 MG tablet Take 1 tablet (75 mg total) by mouth daily. 08/09/18   Guadalupe Maple, MD  hydrochlorothiazide (HYDRODIURIL) 25 MG tablet Take  1 tablet (25 mg total) by mouth daily. 08/09/18   Steele Sizer, MD  Infant Care Products (DERMACLOUD) CREA Apply 1 application topically daily. Patient not taking: Reported on 04/19/2019 08/09/18   Steele Sizer, MD  isosorbide mononitrate (IMDUR) 60 MG 24 hr tablet Take 1 tablet (60 mg total) by mouth daily. 06/18/19   Johnson, Megan P, DO  losartan (COZAAR) 100 MG tablet Take 1 tablet (100 mg total) by mouth daily. 08/09/18   Steele Sizer, MD  lovastatin (MEVACOR) 20 MG tablet Take 1 tablet (20 mg total) by mouth at bedtime. 08/09/18   Steele Sizer, MD  metoprolol tartrate (LOPRESSOR) 50 MG tablet Take 1 tablet (50 mg total) by mouth 2 (two) times daily.  08/09/18   Steele Sizer, MD  nitroGLYCERIN (NITROSTAT) 0.4 MG SL tablet DISSOLVE ONE TABLET UNDER THE TONGUE EVERY 5 MINUTES AS NEEDED FOR CHEST PAIN.  DO NOT EXCEED A TOTAL OF 3 DOSES IN 15 MINUTES 06/15/19   Crissman, Redge Gainer, MD  potassium chloride SA (K-DUR,KLOR-CON) 20 MEQ tablet Take 1 tablet (20 mEq total) by mouth daily. 06/05/18   Lyn Records, MD    Family History  Problem Relation Age of Onset  . Healthy Mother   . Prostate cancer Father   . Kidney disease Father   . Healthy Brother   . Cancer Neg Hx   . Diabetes Neg Hx   . Heart disease Neg Hx      Social History   Tobacco Use  . Smoking status: Never Smoker  . Smokeless tobacco: Never Used  Substance Use Topics  . Alcohol use: No  . Drug use: No    Allergies as of 07/11/2019 - Review Complete 04/19/2019  Allergen Reaction Noted  . Brilinta [ticagrelor] Shortness Of Breath 08/06/2015  . Lipitor [atorvastatin] Itching 06/09/2015  . Latex Other (See Comments) 06/09/2015  . Penicillins Itching and Swelling 01/13/2015  . Tekturna [aliskiren] Rash 01/13/2015    Review of Systems:    All systems reviewed and negative except where noted in HPI.   Physical Exam:  LMP  (LMP Unknown)  No LMP recorded (lmp unknown). Patient is postmenopausal. General:   Alert,  Well-developed, well-nourished, pleasant and cooperative in NAD Head:  Normocephalic and atraumatic. Eyes:  Sclera clear, no icterus.   Conjunctiva pink. Ears:  Normal auditory acuity. Neck:  Supple; no masses or thyromegaly. Lungs:  Respirations even and unlabored.  Clear throughout to auscultation.   No wheezes, crackles, or rhonchi. No acute distress. Heart:  Regular rate and rhythm; no murmurs, clicks, rubs, or gallops. Abdomen:  Normal bowel sounds.  No bruits.  Soft, non-tender and non-distended without masses, hepatosplenomegaly or hernias noted.  No guarding or rebound tenderness.  Negative Carnett sign.   Rectal:  Deferred.  Msk:  Symmetrical  without gross deformities.  Good, equal movement & strength bilaterally. Pulses:  Normal pulses noted. Extremities:  No clubbing or edema.  No cyanosis. Neurologic:  Alert and oriented x3;  grossly normal neurologically. Skin:  Intact without significant lesions or rashes.  No jaundice. Lymph Nodes:  No significant cervical adenopathy. Psych:  Alert and cooperative. Normal mood and affect.  Imaging Studies: No results found.  Assessment and Plan:   AUDIANNA LANDGREN is a 68 y.o. y/o female who comes in today with a history of constipation and left side abdominal pain.  The patient is on Plavix for coronary artery disease and reports that she has had 8 stents placed  in the past.  The patient will be started on a trial of Amitiza 8 mcg twice a day to help with her constipation.  The patient will also be set up for a repeat colonoscopy to make sure that there is nothing obstructing that is causing her constipation. I have discussed risks & benefits which include, but are not limited to, bleeding, infection, perforation & drug reaction.  The patient agrees with this plan & written consent will be obtained.       Midge Miniumarren Meah Jiron, MD. Clementeen GrahamFACG    Note: This dictation was prepared with Dragon dictation along with smaller phrase technology. Any transcriptional errors that result from this process are unintentional.

## 2019-07-20 ENCOUNTER — Other Ambulatory Visit
Admission: RE | Admit: 2019-07-20 | Discharge: 2019-07-20 | Disposition: A | Discharge status: Home / Self Care | Payer: Medicare HMO | Source: Ambulatory Visit | Attending: Gastroenterology | Admitting: Gastroenterology

## 2019-07-20 ENCOUNTER — Other Ambulatory Visit: Payer: Self-pay

## 2019-07-20 DIAGNOSIS — Z01812 Encounter for preprocedural laboratory examination: Secondary | ICD-10-CM | POA: Insufficient documentation

## 2019-07-20 DIAGNOSIS — Z20828 Contact with and (suspected) exposure to other viral communicable diseases: Secondary | ICD-10-CM | POA: Diagnosis not present

## 2019-07-20 LAB — SARS CORONAVIRUS 2 (TAT 6-24 HRS): SARS Coronavirus 2: NEGATIVE

## 2019-07-23 ENCOUNTER — Telehealth: Payer: Self-pay | Admitting: Gastroenterology

## 2019-07-23 NOTE — Telephone Encounter (Signed)
Patients call has been returned.  Her colonoscopy has been rescheduled from tomorrow to Tuesday 08/07/19 with Dr. Allen Norris.  She's not feeling well, and did not stop Plavix.  Patients colonoscopy has been rescheduled to Tuesday December 8th.  Patient has been advised to have her COVID Test on Friday December 4th.  Stop Plavix on Tuesday December 1st, resume December 10th per Blood Thinner request received on 06/28/19.  Thanks [employer]

## 2019-07-23 NOTE — Telephone Encounter (Signed)
Pt left vm she states she is having as surgery tomorrow and is having drainage from her throat and hoarseness she would like a call to see if it is OK to still have her procedure tomorrow

## 2019-07-30 ENCOUNTER — Telehealth: Payer: Self-pay | Admitting: Gastroenterology

## 2019-07-30 NOTE — Telephone Encounter (Signed)
Patient called & l/m on v/m to r/s her colonoscopy on 08-07-19.

## 2019-07-31 NOTE — Telephone Encounter (Signed)
Pt left vm to reschedule her procedure to a later date

## 2019-07-31 NOTE — Telephone Encounter (Signed)
Returned patients call in regards to canceling colonoscopy.  She said she would like to have it done sometime next year due to whats happening.  Will call her back next year to schedule.  Thanks  [employer]

## 2019-08-01 ENCOUNTER — Encounter: Payer: Self-pay | Admitting: Family Medicine

## 2019-08-01 ENCOUNTER — Telehealth: Payer: Self-pay | Admitting: Family Medicine

## 2019-08-01 NOTE — Telephone Encounter (Signed)
From: Jill Alexanders [facility])  Sent: Wednesday, August 01, 2019 5:12 PM To: Lincoln Maxin [email or web address]> Cc: Janci Minor ([email or web address]) [email or web address]>; Eula Fried ([email or web address]) [email or web address]> Subject: SECURE: Cyndie Chime - Meals on Wheels Referral -[facility] 08/01/19  Good Afternoon Pamala Hurry, Please see patients information below, let me know if you need anything else.  Patient Name:                  Jennifer Grant                     Address:                              [place], [place] [address]   Phone Number:                [phone]                                     Last 4 ssn:                            [id number]                                      Date of Birth:                     14-Dec-1950                                          Emergency Contact:        Nataleigh Griffin, Daughter [phone]                                      Marital Status:                   No                                             Household Size:                5 Adults, she asked about additional meals for her family members -4 adults (her daughter 93 who works, 3 grandchildren all grown and also work and I told her most likely we could only provide meals for her                                 Race:                                   African American Language Spoken:           English  What are you doing for meals right now?                Anything she can afford at this point, Food Stamps $16 a month will stop in April Any type of paid help in the home?                          no Health Concerns: Diabetic?                                     no How many prescription medications a day?            8 Length of Meal Delivery: 30/60 days                        please add to [facility] wait list 60 days Preferred Start Date:                                                       ASAP  Thank you as always for your assistance,  Manuela Schwartz  Care Guide  Embedded Care Coordination [place]   Care Management ??[email or web address]   ??[phone]

## 2019-08-01 NOTE — Telephone Encounter (Signed)
°  °  [facility] [address] [place], [place]  [address] Phone:  [phone]   Fax:  [phone]   August 01, 2019   [place] [id number]   Dear [name],   Thank you for taking the time to speak with me on the phone today regarding community resources.  Enclosed are some of these resources that you may find helpful, please call me at the number listed below if you have any follow-up questions.  Solicitor is a Teacher, music and elevated toilet seats their thrift store is open most days. [phone] (closed Jennifer Grant and Thurs) [place], [place] [address]  Your [employer] Medicare program may offer Blood pressure monitors please call them at the number below: [phone]  Please let me know if you need further assistance,   [place] Management ??[email or web address]  ??[phone]    [place], [phone]                     1

## 2019-08-01 NOTE — Telephone Encounter (Signed)
° ° °  Called pt regarding [employer] Referral for [employer], and home safety modifications. Pt stated that she is receiving $16 in food stamps but that it will end in April Will sign her up for Meals on Wheels and will submit to B. Truddie Crumble. She has 4 other family members living with her, her daughter got COVID-74 and had to quarantine and is just now returning to work. Feels very blessed to be healthy. Her two grown grandchildren live with her along with her stepchild who is in there 71s.  Will send information on [employer] elevated toilet seat and shower chair. She mentioned she needed a bp cuff and told her that since she has hypertension she may be eligible through her [facility]. She will call them to find out.  Will mail resources on [employer] and other organizations in the county that may be of assistance. Gave her my number should she need anything further.   [place] Management ??[email or web address]   ??[phone]

## 2019-08-02 ENCOUNTER — Telehealth: Payer: Self-pay | Admitting: Family Medicine

## 2019-08-02 NOTE — Telephone Encounter (Signed)
Placed in your folder, needs signature and diagnosis.

## 2019-08-02 NOTE — Telephone Encounter (Signed)
° ° °  Received returned phone call from [facility] regarding elevated toilet seat and shower chair for Jennifer Grant. Representative stated that they take [employer] and that they can deliver and install equipment for patient.  Jolene, Could you please write a script for: Shower Chair - full set-up Elevated Toilet seat with riser (both can be on same Rx)  Please fax the following to [place] [phone] 1.) Rx for chair and toilet seat 2.) Patient's Demographics 3.) Chart notes from last visit  Thank you and let me know if you have any questions.  [place] Management ??[email or web address]   ??[phone]

## 2019-08-02 NOTE — Telephone Encounter (Signed)
° ° °  From: Lincoln Maxin [email or web address]>  Sent: Thursday, August 02, 2019 9:37 AM To: Jill Alexanders [facility]) [email or web address]> Subject: RE: SECURE: Cyndie Chime - Meals on Wheels Referral -[facility] 08/01/19  *Caution - External email - see footer for warnings* Her meals will start Thursday, August 09, 2019 and will end Wednesday, October 31, 2019.

## 2019-08-02 NOTE — Telephone Encounter (Signed)
Can you write script for these and I will sign in morning?  Thank you.

## 2019-08-03 ENCOUNTER — Other Ambulatory Visit: Admission: RE | Admit: 2019-08-03 | Payer: Medicare HMO | Source: Ambulatory Visit

## 2019-08-07 ENCOUNTER — Encounter: Admission: RE | Payer: Self-pay | Source: Home / Self Care

## 2019-08-07 ENCOUNTER — Ambulatory Visit: Admission: RE | Admit: 2019-08-07 | Payer: Medicare HMO | Source: Home / Self Care | Admitting: Gastroenterology

## 2019-08-07 SURGERY — COLONOSCOPY WITH PROPOFOL
Anesthesia: General

## 2019-09-02 ENCOUNTER — Encounter: Payer: Self-pay | Admitting: Nurse Practitioner

## 2019-09-02 ENCOUNTER — Other Ambulatory Visit: Payer: Self-pay | Admitting: Interventional Cardiology

## 2019-09-02 DIAGNOSIS — R7309 Other abnormal glucose: Secondary | ICD-10-CM | POA: Insufficient documentation

## 2019-09-03 ENCOUNTER — Encounter: Payer: Self-pay | Admitting: Family Medicine

## 2019-09-04 ENCOUNTER — Other Ambulatory Visit: Payer: Self-pay

## 2019-09-04 ENCOUNTER — Ambulatory Visit (INDEPENDENT_AMBULATORY_CARE_PROVIDER_SITE_OTHER): Payer: Medicare HMO | Admitting: Nurse Practitioner

## 2019-09-04 ENCOUNTER — Encounter: Payer: Self-pay | Admitting: Nurse Practitioner

## 2019-09-04 VITALS — BP 148/86 | HR 54 | Temp 99.0°F | Ht 62.4 in | Wt 231.0 lb

## 2019-09-04 DIAGNOSIS — Z6841 Body Mass Index (BMI) 40.0 and over, adult: Secondary | ICD-10-CM | POA: Diagnosis not present

## 2019-09-04 DIAGNOSIS — Z9114 Patient's other noncompliance with medication regimen: Secondary | ICD-10-CM | POA: Diagnosis not present

## 2019-09-04 DIAGNOSIS — Z0000 Encounter for general adult medical examination without abnormal findings: Secondary | ICD-10-CM

## 2019-09-04 DIAGNOSIS — I2510 Atherosclerotic heart disease of native coronary artery without angina pectoris: Secondary | ICD-10-CM | POA: Diagnosis not present

## 2019-09-04 DIAGNOSIS — R7309 Other abnormal glucose: Secondary | ICD-10-CM

## 2019-09-04 DIAGNOSIS — I5032 Chronic diastolic (congestive) heart failure: Secondary | ICD-10-CM

## 2019-09-04 DIAGNOSIS — E559 Vitamin D deficiency, unspecified: Secondary | ICD-10-CM

## 2019-09-04 DIAGNOSIS — I10 Essential (primary) hypertension: Secondary | ICD-10-CM | POA: Diagnosis not present

## 2019-09-04 DIAGNOSIS — E782 Mixed hyperlipidemia: Secondary | ICD-10-CM

## 2019-09-04 DIAGNOSIS — Z1231 Encounter for screening mammogram for malignant neoplasm of breast: Secondary | ICD-10-CM

## 2019-09-04 MED ORDER — NITROGLYCERIN 0.4 MG SL SUBL: SUBLINGUAL_TABLET | SUBLINGUAL | 0 refills | Status: DC

## 2019-09-04 MED ORDER — POTASSIUM CHLORIDE CRYS ER 20 MEQ PO TBCR: 20.0000 meq | EXTENDED_RELEASE_TABLET | Freq: Every day | ORAL | 1 refills | Status: DC

## 2019-09-04 NOTE — Assessment & Plan Note (Signed)
Chronic, ongoing.  Continue current medication regimen and collaboration with cardiology.  No recent NTG use, will send in refill. 

## 2019-09-04 NOTE — Assessment & Plan Note (Signed)
Recommend continued focus on healthy diet choices and regular physical activity (30 minutes 5 days a week).  Discussed making small goals at a time.

## 2019-09-04 NOTE — Assessment & Plan Note (Signed)
Chronic, ongoing.  Continue current medication regimen and collaboration with cardiology.  Scheduled to see this Friday.  Discussed diet choices.   - Reminded to call for an overnight weight gain of >2 pounds or a weekly weight weight of >5 pounds - not adding salt to his food and has been reading food labels. Reviewed the importance of keeping daily sodium intake to 2000mg  daily

## 2019-09-04 NOTE — Patient Instructions (Addendum)
[facility] at [facility]  Address: [address] [place], [place], [place] [address]  Phone: [phone]  - Call for an overnight weight gain of >2 pounds or a weekly weight weight of >5 pounds - Not adding salt to food and reading food labels. Reviewed the importance of keeping daily sodium intake to 2000mg  daily   Prediabetes Eating Plan Prediabetes is a condition that causes blood sugar (glucose) levels to be higher than normal. This increases the risk for developing diabetes. In order to prevent diabetes from developing, your health care provider may recommend a diet and other lifestyle changes to help you:  Control your blood glucose levels.  Improve your cholesterol levels.  Manage your blood pressure. Your health care provider may recommend working with a diet and nutrition specialist (dietitian) to make a meal plan that is best for you. What are tips for following this plan? Lifestyle  Set weight loss goals with the help of your health care team. It is recommended that most people with prediabetes lose 7% of their current body weight.  Exercise for at least 30 minutes at least 5 days a week.  Attend a support group or seek ongoing support from a mental health counselor.  Take over-the-counter and prescription medicines only as told by your health care provider. Reading food labels  Read food labels to check the amount of fat, salt (sodium), and sugar in prepackaged foods. Avoid foods that have: ? Saturated fats. ? Trans fats. ? Added sugars.  Avoid foods that have more than 300 milligrams (mg) of sodium per serving. Limit your daily sodium intake to less than 2,300 mg each day. Shopping  Avoid buying pre-made and processed foods. Cooking  Cook with olive oil. Do not use butter, lard, or ghee.  Bake, broil, grill, or boil foods. Avoid frying. Meal planning   Work with your dietitian to develop an eating plan that is right for you. This may  include: ? Tracking how many calories you take in. Use a food diary, notebook, or mobile application to track what you eat at each meal. ? Using the glycemic index (GI) to plan your meals. The index tells you how quickly a food will raise your blood glucose. Choose low-GI foods. These foods take a longer time to raise blood glucose.  Consider following a Mediterranean diet. This diet includes: ? Several servings each day of fresh fruits and vegetables. ? Eating fish at least twice a week. ? Several servings each day of whole grains, beans, nuts, and seeds. ? Using olive oil instead of other fats. ? Moderate alcohol consumption. ? Eating small amounts of red meat and whole-fat dairy.  If you have high blood pressure, you may need to limit your sodium intake or follow a diet such as the DASH eating plan. DASH is an eating plan that aims to lower high blood pressure. What foods are recommended? The items listed below may not be a complete list. Talk with your dietitian about what dietary choices are best for you. Grains Whole grains, such as whole-wheat or whole-grain breads, crackers, cereals, and pasta. Unsweetened oatmeal. Bulgur. Barley. Quinoa. Brown rice. Corn or whole-wheat flour tortillas or taco shells. Vegetables Lettuce. Spinach. Peas. Beets. Cauliflower. Cabbage. Broccoli. Carrots. Tomatoes. Squash. Eggplant. Herbs. Peppers. Onions. Cucumbers. Brussels sprouts. Fruits Berries. Bananas. Apples. Oranges. Grapes. Papaya. Mango. Pomegranate. Kiwi. Grapefruit. Cherries. Meats and other protein foods Seafood. Poultry without skin. Lean cuts of pork and beef. Tofu. Eggs. Nuts. Beans. Dairy Low-fat  or fat-free dairy products, such as yogurt, cottage cheese, and cheese. Beverages Water. Tea. Coffee. Sugar-free or diet soda. Seltzer water. Lowfat or no-fat milk. Milk alternatives, such as soy or almond milk. Fats and oils Olive oil. Canola oil. Sunflower oil. Grapeseed oil. Avocado.  Walnuts. Sweets and desserts Sugar-free or low-fat pudding. Sugar-free or low-fat ice cream and other frozen treats. Seasoning and other foods Herbs. Sodium-free spices. Mustard. Relish. Low-fat, low-sugar ketchup. Low-fat, low-sugar barbecue sauce. Low-fat or fat-free mayonnaise. What foods are not recommended? The items listed below may not be a complete list. Talk with your dietitian about what dietary choices are best for you. Grains Refined white flour and flour products, such as bread, pasta, snack foods, and cereals. Vegetables Canned vegetables. Frozen vegetables with butter or cream sauce. Fruits Fruits canned with syrup. Meats and other protein foods Fatty cuts of meat. Poultry with skin. Breaded or fried meat. Processed meats. Dairy Full-fat yogurt, cheese, or milk. Beverages Sweetened drinks, such as sweet iced tea and soda. Fats and oils Butter. Lard. Ghee. Sweets and desserts Baked goods, such as cake, cupcakes, pastries, cookies, and cheesecake. Seasoning and other foods Spice mixes with added salt. Ketchup. Barbecue sauce. Mayonnaise. Summary  To prevent diabetes from developing, you may need to make diet and other lifestyle changes to help control blood sugar, improve cholesterol levels, and manage your blood pressure.  Set weight loss goals with the help of your health care team. It is recommended that most people with prediabetes lose 7 percent of their current body weight.  Consider following a Mediterranean diet that includes plenty of fresh fruits and vegetables, whole grains, beans, nuts, seeds, fish, lean meat, low-fat dairy, and healthy oils. This information is not intended to replace advice given to you by your health care provider. Make sure you discuss any questions you have with your health care provider. Document Revised: 12/08/2018 Document Reviewed: 10/20/2016 Elsevier Patient Education  2020 [employer].

## 2019-09-04 NOTE — Assessment & Plan Note (Signed)
Chronic, ongoing.  Continue current medication regimen and adjust as needed.  Refills sent.  Obtain labs today.

## 2019-09-04 NOTE — Assessment & Plan Note (Signed)
Recheck A1C today and initiate medication if indicated.  Discussed at length diet choices. 

## 2019-09-04 NOTE — Progress Notes (Signed)
BP (!) 148/86 (BP Location: Left Arm, Patient Position: Sitting)   Pulse (!) 54   Temp 99 F (37.2 C) (Oral)   Ht 5' 2.4" (1.585 m)   Wt 231 lb (104.8 kg)   LMP  (LMP Unknown)   SpO2 99%   BMI 41.71 kg/m    Subjective:    Patient ID: Jennifer Grant, female    DOB: June 26, 1951, 69 y.o.   MRN: [id number]  HPI: Jennifer Grant is a 69 y.o. female presenting on 09/04/2019 for comprehensive medical examination. Current medical complaints include:none  She currently lives with: self Menopausal Symptoms: no   HYPERTENSION / HYPERLIPIDEMIA/HF Currently taking Losartan, Amlodipine, HCTZ, Isosorbide Mono, Plavix, and ASA + Lovastatin.  Last saw cardiology on 10/25/2017.  On review of notes appears non adherence and loss of follow-up is major barrier.  Reports she had been eating lots of salt over Christmas, peanuts and pistachio.  2016 Echo 50-55%.  Discussed with her at length need to maintain cardiology follow-up visits.  Currently has none scheduled, worked with patient and were able to schedule her for this Friday with [facility]. No recent NTG use. Satisfied with current treatment? yes Duration of hypertension: chronic BP monitoring frequency: not checking BP range:  BP medication side effects: no Duration of hyperlipidemia: chronic Cholesterol medication side effects: no Cholesterol supplements: none Medication compliance: good compliance Aspirin: yes Recent stressors: no Recurrent headaches: no Visual changes: no Palpitations: no Dyspnea: yes, only if going up steps Chest pain: no Lower extremity edema: no Dizzy/lightheaded: no  The ASCVD Risk score Denman George DC Jr., et al., 2013) failed to calculate for the following reasons:   The patient has a prior MI or stroke diagnosis  ELEVATED A1C: Most recent A1C 6.1% several years ago, 2017. She does endorse eating lots of Jamaican sweet rolls over past months and poor diet choices.  Polydipsia/polyuria: no Visual disturbance:  no Chest pain: no Paresthesias: no  Depression Screen done today and results listed below:  Depression screen [facility] 2/9 09/04/2019 04/19/2019 08/09/2018 04/14/2018 12/28/2017  Decreased Interest 0 0 0 0 0  Down, Depressed, Hopeless 0 0 0 0 0  PHQ - 2 Score 0 0 0 0 0  Altered sleeping 0 - 0 - -  Tired, decreased energy 0 - 0 - -  Change in appetite 0 - 0 - -  Feeling bad or failure about yourself  0 - 0 - -  Trouble concentrating 0 - 0 - -  Moving slowly or fidgety/restless 0 - 0 - -  Suicidal thoughts 0 - 0 - -  PHQ-9 Score 0 - 0 - -  Difficult doing work/chores - - Not difficult at all - -    The patient does not have a history of falls. I did not complete a risk assessment for falls. A plan of care for falls was not documented.   Past Medical History:  Past Medical History:  Diagnosis Date  . CAD (coronary artery disease)    a. 2004 s/p MI w/ PCI/stenting to the LCX, OM1, OM2, RCA, RPDA; b. 05/2015 MI/PCI: 100 OM1 (2.75x20 & 3.0x8 Promus Premier DES'), PTCA OM2; c. 07/2015 Cath/Staged PCI: LM 60, LAD 75d, LCX 47m ISR, OM1 ok, OM2 25 ISR, RCA 50p, 32m/d, 85d (3.0x28 Promus Premier DES).  . Diastolic dysfunction    a. 05/2015 Echo: EF 50-55%, severe lateral HK. Gr1 DD, mildly dil LA. Mildly reduced RV fxn.  . 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    "got booster shot  a few times"  . GERD (gastroesophageal reflux disease)   . Hyperlipidemia   . Hypertensive heart disease   . Menopausal state   . Morbid obesity (HCC)   . Noncompliance     Surgical History:  Past Surgical History:  Procedure Laterality Date  . CARDIAC CATHETERIZATION N/A 06/08/2015   Procedure: Left Heart Cath and Coronary Angiography;  Surgeon: Lyn Records, MD;  Location: [facility] INVASIVE CV LAB;  Service: Cardiovascular;  Laterality: N/A;  . CARDIAC CATHETERIZATION N/A 06/08/2015   Procedure: Coronary Stent Intervention;  Surgeon: Lyn Records, MD;  Location: [facility] INVASIVE CV LAB;  Service: Cardiovascular;  Laterality: N/A;  . CARDIAC  CATHETERIZATION N/A 07/16/2015   Procedure: Coronary Stent Intervention;  Surgeon: Lyn Records, MD;  Location: [facility] INVASIVE CV LAB;  Service: Cardiovascular;  Laterality: N/A;  . CORONARY ANGIOPLASTY WITH STENT PLACEMENT     "6 stents before 05/2015"  . KNEE ARTHROSCOPY Left 1990's  . TUBAL LIGATION  ~ 1978    Medications:  Current Outpatient Medications on File Prior to Visit  Medication Sig  . amLODipine (NORVASC) 10 MG tablet Take 1 tablet (10 mg total) by mouth daily.  . clopidogrel (PLAVIX) 75 MG tablet Take 1 tablet (75 mg total) by mouth daily.  . hydrochlorothiazide (HYDRODIURIL) 25 MG tablet Take 1 tablet (25 mg total) by mouth daily.  . isosorbide mononitrate (IMDUR) 60 MG 24 hr tablet Take 1 tablet (60 mg total) by mouth daily.  Marland Kitchen losartan (COZAAR) 100 MG tablet Take 1 tablet (100 mg total) by mouth daily.  Marland Kitchen lovastatin (MEVACOR) 20 MG tablet Take 1 tablet (20 mg total) by mouth at bedtime.  . metoprolol tartrate (LOPRESSOR) 50 MG tablet Take 1 tablet (50 mg total) by mouth 2 (two) times daily.  Marland Kitchen aspirin EC 81 MG tablet Take 81 mg by mouth daily.  . Infant Care Products (DERMACLOUD) CREA Apply 1 application topically daily. (Patient not taking: Reported on 04/19/2019)   No current facility-administered medications on file prior to visit.    Allergies:  Allergies  Allergen Reactions  . Brilinta [Ticagrelor] Shortness Of Breath  . Lipitor [Atorvastatin] Itching    Mouth itching, cough  . Latex Other (See Comments)    Gloves make hands look black after prolonged use  . Penicillins Itching and Swelling    Tongue itching and lip swelling Has patient had a PCN reaction causing immediate rash, facial/tongue/throat swelling, SOB or lightheadedness with hypotension: yes Has patient had a PCN reaction causing severe rash involving mucus membranes or skin necrosis: No Has patient had a PCN reaction that required hospitalization No Has patient had a PCN reaction occurring within  the last 10 years: No If all of the above answers are "NO", then may proceed with Cephalosporin use.  Danie Chandler [Aliskiren] Rash    Social History:  Social History   Socioeconomic History  . Marital status: Widowed    Spouse name: Not on file  . Number of children: Not on file  . Years of education: Not on file  . Highest education level: 11th grade  Occupational History  . Occupation: retired  Tobacco Use  . Smoking status: Never Smoker  . Smokeless tobacco: Never Used  Substance and Sexual Activity  . Alcohol use: No  . Drug use: No  . Sexual activity: Never    Birth control/protection: Surgical  Other Topics Concern  . Not on file  Social History Narrative  . Not on file   Social Determinants  of Health   Financial Resource Strain:   . Difficulty of Paying Living Expenses: Not on file  Food Insecurity:   . Worried About Charity fundraiser in the Last Year: Not on file  . Ran Out of Food in the Last Year: Not on file  Transportation Needs:   . Lack of Transportation (Medical): Not on file  . Lack of Transportation (Non-Medical): Not on file  Physical Activity:   . Days of Exercise per Week: Not on file  . Minutes of Exercise per Session: Not on file  Stress:   . Feeling of Stress : Not on file  Social Connections:   . Frequency of Communication with Friends and Family: Not on file  . Frequency of Social Gatherings with Friends and Family: Not on file  . Attends Religious Services: Not on file  . Active Member of Clubs or Organizations: Not on file  . Attends Archivist Meetings: Not on file  . Marital Status: Not on file  Intimate Partner Violence:   . Fear of Current or Ex-Partner: Not on file  . Emotionally Abused: Not on file  . Physically Abused: Not on file  . Sexually Abused: Not on file   Social History   Tobacco Use  Smoking Status Never Smoker  Smokeless Tobacco Never Used   Social History   Substance and Sexual Activity  Alcohol  Use No    Family History:  Family History  Problem Relation Age of Onset  . Healthy Mother   . Prostate cancer Father   . Kidney disease Father   . Healthy Brother   . Cancer Neg Hx   . Diabetes Neg Hx   . Heart disease Neg Hx     Past medical history, surgical history, medications, allergies, family history and social history reviewed with patient today and changes made to appropriate areas of the chart.   Review of Systems - negative All other ROS negative except what is listed above and in the HPI.      Objective:    BP (!) 148/86 (BP Location: Left Arm, Patient Position: Sitting)   Pulse (!) 54   Temp 99 F (37.2 C) (Oral)   Ht 5' 2.4" (1.585 m)   Wt 231 lb (104.8 kg)   LMP  (LMP Unknown)   SpO2 99%   BMI 41.71 kg/m   Wt Readings from Last 3 Encounters:  09/04/19 231 lb (104.8 kg)  07/11/19 230 lb 3.2 oz (104.4 kg)  08/09/18 227 lb 12.8 oz (103.3 kg)    Physical Exam Constitutional:      General: She is awake. She is not in acute distress.    Appearance: She is well-developed. She is not ill-appearing.  HENT:     Head: Normocephalic and atraumatic.     Right Ear: Hearing, tympanic membrane, ear canal and external ear normal. No drainage.     Left Ear: Hearing, tympanic membrane, ear canal and external ear normal. No drainage.     Nose: Nose normal.     Right Sinus: No maxillary sinus tenderness or frontal sinus tenderness.     Left Sinus: No maxillary sinus tenderness or frontal sinus tenderness.     Mouth/Throat:     Mouth: Mucous membranes are moist.     Pharynx: Oropharynx is clear. Uvula midline. No pharyngeal swelling, oropharyngeal exudate or posterior oropharyngeal erythema.  Eyes:     General: Lids are normal.        Right eye: No  discharge.        Left eye: No discharge.     Extraocular Movements: Extraocular movements intact.     Conjunctiva/sclera: Conjunctivae normal.     Pupils: Pupils are equal, round, and reactive to light.     Visual  Fields: Right eye visual fields normal and left eye visual fields normal.  Neck:     Thyroid: No thyromegaly.     Vascular: No carotid bruit.     Trachea: Trachea normal.  Cardiovascular:     Rate and Rhythm: Normal rate and regular rhythm.     Heart sounds: Normal heart sounds. No murmur. No gallop.   Pulmonary:     Effort: Pulmonary effort is normal. No accessory muscle usage or respiratory distress.     Breath sounds: Normal breath sounds.  Chest:     Breasts:        Right: Normal.        Left: Normal.  Abdominal:     General: Bowel sounds are normal.     Palpations: Abdomen is soft. There is no hepatomegaly or splenomegaly.     Tenderness: There is no abdominal tenderness.  Musculoskeletal:        General: Normal range of motion.     Cervical back: Normal range of motion and neck supple.     Right lower leg: No edema.     Left lower leg: No edema.  Lymphadenopathy:     Head:     Right side of head: No submental, submandibular, tonsillar, preauricular or posterior auricular adenopathy.     Left side of head: No submental, submandibular, tonsillar, preauricular or posterior auricular adenopathy.     Cervical: No cervical adenopathy.     Upper Body:     Right upper body: No supraclavicular, axillary or pectoral adenopathy.     Left upper body: No supraclavicular, axillary or pectoral adenopathy.  Skin:    General: Skin is warm and dry.     Capillary Refill: Capillary refill takes less than 2 seconds.     Findings: No rash.  Neurological:     Mental Status: She is alert and oriented to person, place, and time.     Cranial Nerves: Cranial nerves are intact.     Gait: Gait is intact.     Deep Tendon Reflexes: Reflexes are normal and symmetric.     Reflex Scores:      Brachioradialis reflexes are 2+ on the right side and 2+ on the left side.      Patellar reflexes are 2+ on the right side and 2+ on the left side. Psychiatric:        Attention and Perception: Attention  normal.        Mood and Affect: Mood normal.        Speech: Speech normal.        Behavior: Behavior normal. Behavior is cooperative.        Thought Content: Thought content normal.        Judgment: Judgment normal.    Results for orders placed or performed during the hospital encounter of 07/20/19  SARS CORONAVIRUS 2 (TAT 6-24 HRS) Nasopharyngeal Nasopharyngeal Swab   Specimen: Nasopharyngeal Swab  Result Value Ref Range   SARS Coronavirus 2 NEGATIVE NEGATIVE      Assessment & Plan:   Problem List Items Addressed This Visit      Cardiovascular and Mediastinum   Coronary artery disease involving native heart (Chronic)    Chronic, ongoing.  Continue  current medication regimen and collaboration with cardiology.  No recent NTG use, will send in refill.      Relevant Medications   nitroGLYCERIN (NITROSTAT) 0.4 MG SL tablet   Essential hypertension    Chronic, ongoing with poor adherence to medication regimen.  Initial BP elevated, but repeat closer to goal.  Recommend ensuring medication taken daily + checking BP at home at least a few mornings a week.  Continue collaboration with cardiology, is scheduled to see them this Friday.  Continue current medication regimen at this time, suspect adjustments will need to be made in future.  CCM referral to assist with adherence.  Return in 3 months.      Relevant Medications   nitroGLYCERIN (NITROSTAT) 0.4 MG SL tablet   Other Relevant Orders   CBC with Differential/Platelet out   Comprehensive metabolic panel   TSH   Chronic diastolic heart failure (HCC)    Chronic, ongoing.  Continue current medication regimen and collaboration with cardiology.  Scheduled to see this Friday.  Discussed diet choices.   - Reminded to call for an overnight weight gain of >2 pounds or a weekly weight weight of >5 pounds - not adding salt to his food and has been reading food labels. Reviewed the importance of keeping daily sodium intake to 2000mg  daily        Relevant Medications   nitroGLYCERIN (NITROSTAT) 0.4 MG SL tablet     Other   Hyperlipidemia    Chronic, ongoing.  Continue current medication regimen and adjust as needed.  Refills sent.  Obtain labs today.      Relevant Medications   nitroGLYCERIN (NITROSTAT) 0.4 MG SL tablet   Other Relevant Orders   Comprehensive metabolic panel   Lipid Panel w/o Chol/HDL Ratio out   BMI 45.0-49.9, adult (HCC)    Recommend continued focus on healthy diet choices and regular physical activity (30 minutes 5 days a week).  Has poor diet at home, discussed at length with her.      Noncompliance with medication regimen    Ongoing issue, will involve CCM team to assist.      Morbid obesity (HCC)    Recommend continued focus on healthy diet choices and regular physical activity (30 minutes 5 days a week).  Discussed making small goals at a time.      Elevated hemoglobin A1c    Recheck A1C today and initiate medication if indicated.  Discussed at length diet choices.      Relevant Orders   CBC with Differential/Platelet out   HgB A1c    Other Visit Diagnoses    Annual physical exam    -  Primary   Annual labs to include CBC, CMP, TSH, lipid.   Vitamin D deficiency       Reports history of low level, will recheck this today and start supplement as needed.   Relevant Orders   Vit D  25 hydroxy (rtn osteoporosis monitoring)   Breast cancer screening by mammogram       Mammogram order.   Relevant Orders   MM DIGITAL SCREENING BILATERAL       Follow up plan: Return in about 3 months (around 12/03/2019) for HTN/HLD, HF, Prediabetes.   LABORATORY TESTING:  - Pap smear: not applicable  IMMUNIZATIONS:   - Tdap: Tetanus vaccination status reviewed: last tetanus booster within 10 years. - Influenza: Refused - Pneumovax: Up to date - Prevnar: Up to date - HPV: Not applicable - Zostavax vaccine: Refused  SCREENING: -  Mammogram: Ordered today  - Colonoscopy: Refused  - Bone Density:  Refused  -Hearing Test: Not applicable  -Spirometry: Not applicable   PATIENT COUNSELING:   Advised to take 1 mg of folate supplement per day if capable of pregnancy.   Sexuality: Discussed sexually transmitted diseases, partner selection, use of condoms, avoidance of unintended pregnancy  and contraceptive alternatives.   Advised to avoid cigarette smoking.  I discussed with the patient that most people either abstain from alcohol or drink within safe limits (<=14/week and <=4 drinks/occasion for males, <=7/weeks and <= 3 drinks/occasion for females) and that the risk for alcohol disorders and other health effects rises proportionally with the number of drinks per week and how often a drinker exceeds daily limits.  Discussed cessation/primary prevention of drug use and availability of treatment for abuse.   Diet: Encouraged to adjust caloric intake to maintain  or achieve ideal body weight, to reduce intake of dietary saturated fat and total fat, to limit sodium intake by avoiding high sodium foods and not adding table salt, and to maintain adequate dietary potassium and calcium preferably from fresh fruits, vegetables, and low-fat dairy products.    stressed the importance of regular exercise  Injury prevention: Discussed safety belts, safety helmets, smoke detector, smoking near bedding or upholstery.   Dental health: Discussed importance of regular tooth brushing, flossing, and dental visits.    NEXT PREVENTATIVE PHYSICAL DUE IN 1 YEAR. Return in about 3 months (around 12/03/2019) for HTN/HLD, HF, Prediabetes.

## 2019-09-04 NOTE — Assessment & Plan Note (Signed)
Recommend continued focus on healthy diet choices and regular physical activity (30 minutes 5 days a week).  Has poor diet at home, discussed at length with her. 

## 2019-09-04 NOTE — Assessment & Plan Note (Signed)
Chronic, ongoing with poor adherence to medication regimen.  Initial BP elevated, but repeat closer to goal.  Recommend ensuring medication taken daily + checking BP at home at least a few mornings a week.  Continue collaboration with cardiology, is scheduled to see them this Friday.  Continue current medication regimen at this time, suspect adjustments will need to be made in future.  CCM referral to assist with adherence.  Return in 3 months.

## 2019-09-04 NOTE — Assessment & Plan Note (Signed)
Ongoing issue, will involve CCM team to assist.

## 2019-09-05 ENCOUNTER — Encounter: Payer: Self-pay | Admitting: Nurse Practitioner

## 2019-09-05 DIAGNOSIS — E559 Vitamin D deficiency, unspecified: Secondary | ICD-10-CM | POA: Insufficient documentation

## 2019-09-05 LAB — CBC WITH DIFFERENTIAL/PLATELET
Basophils Absolute: 0 10*3/uL (ref 0.0–0.2)
Basos: 1 %
EOS (ABSOLUTE): 0.2 10*3/uL (ref 0.0–0.4)
Eos: 3 %
Hematocrit: 40.7 % (ref 34.0–46.6)
Hemoglobin: 12.9 g/dL (ref 11.1–15.9)
Immature Grans (Abs): 0 10*3/uL (ref 0.0–0.1)
Immature Granulocytes: 0 %
Lymphocytes Absolute: 2.8 10*3/uL (ref 0.7–3.1)
Lymphs: 44 %
MCH: 25.9 pg — ABNORMAL LOW (ref 26.6–33.0)
MCHC: 31.7 g/dL (ref 31.5–35.7)
MCV: 82 fL (ref 79–97)
Monocytes Absolute: 0.4 10*3/uL (ref 0.1–0.9)
Monocytes: 7 %
Neutrophils Absolute: 3 10*3/uL (ref 1.4–7.0)
Neutrophils: 45 %
Platelets: 209 10*3/uL (ref 150–450)
RBC: 4.98 x10E6/uL (ref 3.77–5.28)
RDW: 14.8 % (ref 11.7–15.4)
WBC: 6.4 10*3/uL (ref 3.4–10.8)

## 2019-09-05 LAB — COMPREHENSIVE METABOLIC PANEL
ALT: 12 IU/L (ref 0–32)
AST: 20 IU/L (ref 0–40)
Albumin/Globulin Ratio: 1.2 (ref 1.2–2.2)
Albumin: 4 g/dL (ref 3.8–4.8)
Alkaline Phosphatase: 73 IU/L (ref 39–117)
BUN/Creatinine Ratio: 16 (ref 12–28)
BUN: 11 mg/dL (ref 8–27)
Bilirubin Total: 0.5 mg/dL (ref 0.0–1.2)
CO2: 25 mmol/L (ref 20–29)
Calcium: 9.9 mg/dL (ref 8.7–10.3)
Chloride: 96 mmol/L (ref 96–106)
Creatinine, Ser: 0.68 mg/dL (ref 0.57–1.00)
GFR calc Af Amer: 102 mL/min/{1.73_m2} (ref >59)
GFR calc non Af Amer: 89 mL/min/{1.73_m2} (ref >59)
Globulin, Total: 3.3 g/dL (ref 1.5–4.5)
Glucose: 91 mg/dL (ref 65–99)
Potassium: 3.6 mmol/L (ref 3.5–5.2)
Sodium: 135 mmol/L (ref 134–144)
Total Protein: 7.3 g/dL (ref 6.0–8.5)

## 2019-09-05 LAB — TSH: TSH: 3.24 u[IU]/mL (ref 0.450–4.500)

## 2019-09-05 LAB — HEMOGLOBIN A1C
Est. average glucose Bld gHb Est-mCnc: 123 mg/dL
Hgb A1c MFr Bld: 5.9 % — ABNORMAL HIGH (ref 4.8–5.6)

## 2019-09-05 LAB — LIPID PANEL W/O CHOL/HDL RATIO
Cholesterol, Total: 205 mg/dL — ABNORMAL HIGH (ref 100–199)
HDL: 43 mg/dL (ref >39)
LDL Chol Calc (NIH): 142 mg/dL — ABNORMAL HIGH (ref 0–99)
Triglycerides: 109 mg/dL (ref 0–149)
VLDL Cholesterol Cal: 20 mg/dL (ref 5–40)

## 2019-09-05 LAB — VITAMIN D 25 HYDROXY (VIT D DEFICIENCY, FRACTURES): Vit D, 25-Hydroxy: 22.4 ng/mL — ABNORMAL LOW (ref 30.0–100.0)

## 2019-09-05 NOTE — Progress Notes (Signed)
Please let Jennifer Grant know following: -CBC looks good, as do kidney and liver function + electrolytes. -Thyroid testing is normal. -Vitamin D level a little low, I would like her to start taking Vitamin D3 1000 units daily, which she can obtain in any vitamin section. - Cholesterol levels are still elevated.  Is she taking her Lovastatin every day and was she fasting with these labs?  If both things she is doing, then I would like to increase her Lovastatin dose and then see her back in 6 weeks to recheck cholesterol levels.  This is important for stroke prevention. -A1C still shows prediabetes, but is lower than previous.  This is good!! Thank you so much!!  Have a good evening.

## 2019-09-06 ENCOUNTER — Telehealth: Payer: Self-pay

## 2019-09-06 ENCOUNTER — Telehealth: Payer: Self-pay | Admitting: Interventional Cardiology

## 2019-09-06 NOTE — Telephone Encounter (Signed)
New Message    Pt is wanting to switch back from Dr End to Dr Katrinka Blazing.  Former Dr Katrinka Blazing pt, 04/2017 pt requested to switch to Dr End due to transportation.    Please advise

## 2019-09-06 NOTE — Telephone Encounter (Signed)
-----   Message from Avie Arenas, [place] sent at 07/31/2019  8:43 AM EST ----- Regarding: January Colonoscopy Patient would like her colonoscopy in 2021.  Call her back to schedule.

## 2019-09-06 NOTE — Telephone Encounter (Signed)
Copied from CRM [id number]. Topic: General - Call Back - No Documentation >> Sep 06, 2019  3:30 PM Randol Kern wrote: Reason for CRM: Pt wants to be seen with Antoine Primas, requesting call back from PCP/CMA Best contact: [phone]

## 2019-09-06 NOTE — Telephone Encounter (Signed)
LVM to follow up to see if patient would like to reschedule her colonoscopy for the month of January with Dr. Servando Snare.  Asked her to please call the office back if she would like to do so. The original date scheduled, was canceled.  Thanks [employer]

## 2019-09-07 ENCOUNTER — Ambulatory Visit: Payer: Medicare HMO | Admitting: Family

## 2019-09-07 NOTE — Telephone Encounter (Signed)
Called and left a message asking patient to call to let us know why she needs to go see Antoine Primas.

## 2019-09-07 NOTE — Telephone Encounter (Signed)
Fine with me, too.  Jennifer Grant

## 2019-09-07 NOTE — Telephone Encounter (Signed)
Whatever works is fine with me.

## 2019-09-10 ENCOUNTER — Telehealth: Payer: Self-pay | Admitting: Family Medicine

## 2019-09-10 ENCOUNTER — Other Ambulatory Visit: Payer: Self-pay | Admitting: Nurse Practitioner

## 2019-09-10 DIAGNOSIS — I5032 Chronic diastolic (congestive) heart failure: Secondary | ICD-10-CM

## 2019-09-10 NOTE — Telephone Encounter (Signed)
Yes, see telephone encounter from 09/06/19 with Dr. Katrinka Blazing. He is aware that the patient wants to switch back to him.

## 2019-09-10 NOTE — Telephone Encounter (Signed)
Referral placed.

## 2019-09-10 NOTE — Telephone Encounter (Signed)
She currently is with [facility], does she wish referral elsewhere then?

## 2019-09-10 NOTE — Telephone Encounter (Signed)
Called and spoke to patient. She states that she is wanting to go back to Dr. Verdis Prime, cardiologist in [place] with [facility]

## 2019-09-10 NOTE — Chronic Care Management (AMB) (Signed)
  Chronic Care Management   Outreach Note  09/10/2019 Name: [name] MRN: [id number] DOB: 14-Jul-1951  Jennifer Grant is a 69 y.o. year old female who is a primary care patient of Crissman, Redge Gainer, MD. I reached out to [name] by phone today in response to a referral sent by Ms. Olen Cordial Causer's PCP, Aura Dials FNP     An unsuccessful telephone outreach was attempted today. The patient was referred to the case management team by for assistance with care management and care coordination.   Follow Up Plan: A HIPPA compliant phone message was left for the patient providing contact information and requesting a return call.  The care management team will reach out to the patient again over the next 7 days.  If patient returns call to provider office, please advise to call Embedded Care Management Care Guide Elisha Ponder LPN at [phone]  Elisha Ponder, LPN Health Advisor, Embedded Care Coordination [facility] Management ??[email or web address] ??[phone]

## 2019-09-16 NOTE — Progress Notes (Deleted)
Cardiology Office Note    Date:  09/16/2019   ID:  Jennifer Grant, DOB 1951-02-11, MRN [id number]  PCP:  Guadalupe Maple, MD  Cardiologist:  Nelva Bush, MD  Electrophysiologist:  None   Chief Complaint: Follow up  History of Present Illness:   Jennifer Grant is a 69 y.o. female with history of CAD with MI in 2004 s/p prior stenting as below, diastolic dysfunction, hypertensive heart disease, HLD, obesity, noncompliance and GERD who presents for follow up of her CAD.  She was previously followed by Dr. Tamala Julian, though transferred to Dr. Saunders Revel secondary to convenience with plans to transfer back to Dr. Tamala Julian moving forward.   She was admitted with an MI in 2004 with subsequent PCI/stenting to the LCx, OM1, OM2, RCA, and rPDA. In 05/2015, she suffered another MI in the setting of noncompliance with medications requiring PCI/DES to a totally occluded OM1 along with PTCA of the OM2. She had residual distal RCA disease with staged PCI/DES in 07/2015. Echo at that time showed normal LVSF with an EF of 50-55%, sevre LVH, severe HK of the lateral myocardium, Gr1DD, mildly dilated let atrium, mildly reduced RVSF. She has previously admitted to skipping her medications for no particular reason, indicating she has a history of missing pills most days of the week. She was most recently seen in the office in 09/2017 with an initial BP of 215/115. She had not taken any of her medications that day. She had her pills with her and took them during her visit. She was not taking recently prescribed HCTZ, as she noted this made her face tingle. Follow up visit 1 week later for BP check of 140/80. She was not taking ASA, as she reported this upset her stomach. She was tolerating Plavix. She has previously been prescribed Crestor, though self discontinued this feeling like it was making her cough. Lovastatin was on her medication list, though compliance was uncertain. She was seen by her PCP earlier this month with  noncompliance again an issue.   ***    Labs independently reviewed: 08/2019 - A1c 5.9, TC 205, TG 109, HDL 43, LDL 142, TSH normal, BUN 11, SCr 0.68, potassium 3.6, albumin 4.0, AST/ALT normal, HGB 12.9, PLT 209  Past Medical History:  Diagnosis Date  . CAD (coronary artery disease)    a. 2004 s/p MI w/ PCI/stenting to the LCX, OM1, OM2, RCA, RPDA; b. 05/2015 MI/PCI: 100 OM1 (2.75x20 & 3.0x8 Promus Premier DES'), PTCA OM2; c. 07/2015 Cath/Staged PCI: LM 60, LAD 75d, LCX 61m ISR, OM1 ok, OM2 25 ISR, RCA 50p, 68m/d, 85d (3.0x28 Promus Premier DES).  . Diastolic dysfunction    a. 05/2015 Echo: EF 50-55%, severe lateral HK. Gr1 DD, mildly dil LA. Mildly reduced RV fxn.  . 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    "got booster shot a few times"  . GERD (gastroesophageal reflux disease)   . Hyperlipidemia   . Hypertensive heart disease   . Menopausal state   . Morbid obesity ([place])   . Noncompliance     Past Surgical History:  Procedure Laterality Date  . CARDIAC CATHETERIZATION N/A 06/08/2015   Procedure: Left Heart Cath and Coronary Angiography;  Surgeon: Belva Crome, MD;  Location: [facility];  Service: Cardiovascular;  Laterality: N/A;  . CARDIAC CATHETERIZATION N/A 06/08/2015   Procedure: Coronary Stent Intervention;  Surgeon: Belva Crome, MD;  Location: [facility];  Service: Cardiovascular;  Laterality: N/A;  . CARDIAC CATHETERIZATION N/A  07/16/2015   Procedure: Coronary Stent Intervention;  Surgeon: Lyn Records, MD;  Location: [facility];  Service: Cardiovascular;  Laterality: N/A;  . CORONARY ANGIOPLASTY WITH STENT PLACEMENT     "6 stents before 05/2015"  . KNEE ARTHROSCOPY Left 1990's  . TUBAL LIGATION  ~ 1978    Current Medications: No outpatient medications have been marked as taking for the 09/18/19 encounter (Appointment) with Sondra Barges, PA-C.    Allergies:   Brilinta [ticagrelor], Lipitor [atorvastatin], Latex, Penicillins, and Tekturna [aliskiren]   Social  History   Socioeconomic History  . Marital status: Widowed    Spouse name: Not on file  . Number of children: Not on file  . Years of education: Not on file  . Highest education level: 11th grade  Occupational History  . Occupation: retired  Tobacco Use  . Smoking status: Never Smoker  . Smokeless tobacco: Never Used  Substance and Sexual Activity  . Alcohol use: No  . Drug use: No  . Sexual activity: Never    Birth control/protection: Surgical  Other Topics Concern  . Not on file  Social History Narrative  . Not on file   Social Determinants of Health   Financial Resource Strain:   . Difficulty of Paying Living Expenses: Not on file  Food Insecurity:   . Worried About Programme researcher, broadcasting/film/video in the Last Year: Not on file  . Ran Out of Food in the Last Year: Not on file  Transportation Needs:   . Lack of Transportation (Medical): Not on file  . Lack of Transportation (Non-Medical): Not on file  Physical Activity:   . Days of Exercise per Week: Not on file  . Minutes of Exercise per Session: Not on file  Stress:   . Feeling of Stress : Not on file  Social Connections:   . Frequency of Communication with Friends and Family: Not on file  . Frequency of Social Gatherings with Friends and Family: Not on file  . Attends Religious Services: Not on file  . Active Member of Clubs or Organizations: Not on file  . Attends Banker Meetings: Not on file  . Marital Status: Not on file     Family History:  The patient's family history includes Healthy in her brother and mother; Kidney disease in her father; Prostate cancer in her father. There is no history of Cancer, Diabetes, or Heart disease.  ROS:   ROS   EKGs/Labs/Other Studies Reviewed:    Studies reviewed were summarized above. The additional studies were reviewed today:  2D Echo 05/2015: - Left ventricle: Wall thickness was increased in a pattern of   severe LVH. Systolic function was normal. The  estimated ejection   fraction was in the range of 50% to 55%. There is severe   hypokinesis of the lateral myocardium. Doppler parameters are   consistent with abnormal left ventricular relaxation (grade 1   diastolic dysfunction). - Left atrium: The atrium was mildly dilated. - Right ventricle: Systolic function was mildly reduced. __________  LHC 05/2015:  Acute lateral wall infarction due to stent thrombosis in the large first obtuse marginal and subacute occlusion due to restenosis of the second obtuse marginal.  Successful angioplasty and restenting of the first obtuse marginal from 100% to 0% with TIMI grade 3 flow. The very terminal and of the first obtuse marginal suffered distal embolization from the proximal culprit lesion.  Successful angioplasty of in-stent restenosis in the second obtuse marginal from  99% to less than 50% with TIMI grade 3 flow  In-stent restenosis in the distal right coronary and PDA 70-90% in both areas.  Widely patent stent in the mid circumflex  Eccentric 50 and 75% stenoses in the mid and distal LAD. No prior intervention has been performed on the LAD.  Left ventricular dysfunction with EF 45-50% and wall motion abnormality involving the mid anterior wall. Marked elevation need EP suggests acute combined systolic and diastolic heart failure   Recommendations:   Aggressive risk factor modification to include high intensity statin therapy, aggressive blood pressure control, and cardiac rehabilitation.  Lifelong dual antiplatelet therapy.  Right coronary intervention, staged, to be performed in several weeks. Should be performed from the femoral approach, as the patient has marked tortuosity in subclavian creating an unfavorable access for right coronary intervention.  Beta blocker therapy  IV nitroglycerin  Replete potassium  I will follow as attending on team D starting in a.m. __________  1. LHC 07/2015: Dist LAD lesion, 75% stenosed.  2. Mid RCA to Dist RCA lesion, 90% stenosed. Post intervention, there is a 0% residual stenosis. The lesion was previously treated with a stent (unknown type).    Widely patent left coronary stents without change in anatomy since acute intervention earlier this year.  90% stenoses in the proximal to mid distal RCA overlapping stents reduced to 0% after angioplasty and placement of a 28 x 3.0 Promus Premier. Final balloon diameter 3.25 mm. TIMI grade 3 flow is noted.    RECOMMENDATIONS:   Sheath removal once ACT is less than 175 seconds.  Ambulate later today.  Continue aspirin and brought lentiform at least 12 months  Anticipate discharge in a.m.  Discontinue nitroglycerin after sheath is removed.  Follow-up Dr. Verdis Prime 4-6 weeks  EKG:  EKG is ordered today.  The EKG ordered today demonstrates ***  Recent Labs: 09/04/2019: ALT 12; BUN 11; Creatinine, Ser 0.68; Hemoglobin 12.9; Platelets 209; Potassium 3.6; Sodium 135; TSH 3.240  Recent Lipid Panel    Component Value Date/Time   CHOL 205 (H) 09/04/2019 1034   CHOL 206 (H) 12/28/2017 1356   TRIG 109 09/04/2019 1034   TRIG 136 12/28/2017 1356   HDL 43 09/04/2019 1034   CHOLHDL 4.5 (H) 08/09/2018 1521   CHOLHDL 3.8 07/16/2015 0733   VLDL 27 12/28/2017 1356   LDLCALC 142 (H) 09/04/2019 1034    PHYSICAL EXAM:    VS:  LMP  (LMP Unknown)   BMI: There is no height or weight on file to calculate BMI.  Physical Exam  Wt Readings from Last 3 Encounters:  09/04/19 231 lb (104.8 kg)  07/11/19 230 lb 3.2 oz (104.4 kg)  08/09/18 227 lb 12.8 oz (103.3 kg)     ASSESSMENT & PLAN:   1. ***  Disposition: F/u with Dr. Okey Dupre or an APP in ***.   Medication Adjustments/Labs and Tests Ordered: Current medicines are reviewed at length with the patient today.  Concerns regarding medicines are outlined above. Medication changes, Labs and Tests ordered today are summarized above and listed in the Patient Instructions accessible  in Encounters.   Signed, Eula Listen, PA-C 09/16/2019 11:17 AM     [facility] [address] [place], [place] [address] [phone]

## 2019-09-17 NOTE — Chronic Care Management (AMB) (Signed)
  Chronic Care Management   Note  09/17/2019 Name: HARUKO MERSCH MRN: [id number] DOB: [birth year]  SAVREEN GEBHARDT is a 69 y.o. year old female who is a primary care patient of Crissman, Jeannette How, MD. I reached out to [name] by phone today in response to a referral sent by Ms. Denna Haggard Fosco's PCP, Marnee Guarneri FNP     Ms. Hoel was given information about Chronic Care Management services today including:  1. CCM service includes personalized support from designated clinical staff supervised by her physician, including individualized plan of care and coordination with other care providers 2. 24/7 contact phone numbers for assistance for urgent and routine care needs. 3. Service will only be billed when office clinical staff spend 20 minutes or more in a month to coordinate care. 4. Only one practitioner may furnish and bill the service in a calendar month. 5. The patient may stop CCM services at any time (effective at the end of the month) by phone call to the office staff. 6. The patient will be responsible for cost sharing (co-pay) of up to 20% of the service fee (after annual deductible is met).  Patient agreed to services and verbal consent obtained.   Follow up plan: Telephone appointment with care management team member scheduled for:10/19/2019  Glenna Durand, LPN Health Advisor, [place] Management ??[email or web address] ??[phone]

## 2019-09-18 ENCOUNTER — Ambulatory Visit: Payer: Medicare HMO | Admitting: Physician Assistant

## 2019-10-19 ENCOUNTER — Ambulatory Visit: Payer: Self-pay | Admitting: Pharmacist

## 2019-10-19 ENCOUNTER — Telehealth: Payer: Medicare HMO

## 2019-10-19 NOTE — Chronic Care Management (AMB) (Signed)
  Chronic Care Management   Note  10/19/2019 Name: Jennifer Grant MRN: [id number] DOB: [birth year]  [name] is a 69 y.o. year old female who is a primary care patient of Crissman, Redge Gainer, MD. The CCM team was consulted for assistance with chronic disease management and care coordination needs.    Attempted to outreach patient as previously scheduled. Left voicemail for her to return my call at her convenience.   Follow up plan: - Will collaborate w/ Care Guide to outreach patient to reschedule with me. If patient desires, may reschedule face to face visit on 12/04/19 when she comes in to meet with her PCP.   Catie Feliz Beam, PharmD, [facility] Clinical Pharmacist [facility] [phone]

## 2019-10-22 ENCOUNTER — Telehealth: Payer: Self-pay | Admitting: Family Medicine

## 2019-10-22 NOTE — Chronic Care Management (AMB) (Signed)
  Care Management   Note  10/22/2019 Name: Jennifer Grant MRN: [id number] DOB: [birth year]  Jennifer Grant is a 69 y.o. year old female who is a primary care patient of Crissman, Redge Gainer, MD and is actively engaged with the care management team. I reached out to [name] by phone today to assist with re-scheduling a follow up visit with the Pharmacist  Follow up plan: Telephone appointment with care management team member scheduled for:12/04/2019  Penne Lash, RMA Care Guide, Embedded Care Coordination [facility]  [place], [place] [address] Direct Dial: [phone] [email or web address] Website: [email or web address]

## 2019-10-22 NOTE — Chronic Care Management (AMB) (Signed)
  Care Management   Note  10/22/2019 Name: Jennifer Grant MRN: [id number] DOB: 1950-12-22  Jennifer Grant is a 69 y.o. year old female who is a primary care patient of Dossie Arbour, Redge Gainer, MD and is actively engaged with the care management team. I reached out to [name] by phone today to assist with re-scheduling a follow up visit with the Pharmacist  Follow up plan: Unsuccessful telephone outreach attempt made. A HIPPA compliant phone message was left for the patient providing contact information and requesting a return call.  The care management team will reach out to the patient again over the next 7 days.  If patient returns call to provider office, please advise to call Embedded Care Management Care Guide Penne Lash  at [phone]  Penne Lash, RMA Care Guide, Embedded Care Coordination [facility]  [place], [place] [address] Direct Dial: [phone] [email or web address] Website: [email or web address]

## 2019-11-21 NOTE — Progress Notes (Signed)
Cardiology Office Note:    Date:  11/22/2019   ID:  Jennifer Grant, DOB [birth year], MRN [id number]  PCP:  Jennifer Sizer, MD  Cardiologist:  Jennifer Noe, MD   Referring MD: Jennifer Sizer, MD   Chief Complaint  Patient presents with  . Coronary Artery Disease  . Hypertension    History of Present Illness:    Jennifer Grant is a 69 y.o. female with a hx of coronary artery disease and recent acute infarction due to stent thrombosis. She has history of hypertension, hyperlipidemia, mitral valve prolapse, and chronic diastolic HF.  Jennifer Grant has switched to a cardiologist in [place] because of distance.  She states her preference to continue coming here after sampling the care.  She has had atypical chest discomfort.  None of the discomfort is been exertional.  She frequently feels this when she sits in the chair and rests her arms on her bed while she is reading.  The discomfort seems to improve if she gets up and moves around.  Exertion does not precipitate the discomfort.  She does have some dyspnea on exertion but this has not changed.  She has not had palpitations.  Denies medication side effects.  Past Medical History:  Diagnosis Date  . CAD (coronary artery disease)    a. 2004 s/p MI w/ PCI/stenting to the LCX, OM1, OM2, RCA, RPDA; b. 05/2015 MI/PCI: 100 OM1 (2.75x20 & 3.0x8 Promus Premier DES'), PTCA OM2; c. 07/2015 Cath/Staged PCI: LM 60, LAD 75d, LCX 25m ISR, OM1 ok, OM2 25 ISR, RCA 50p, 58m/d, 85d (3.0x28 Promus Premier DES).  . Diastolic dysfunction    a. 05/2015 Echo: EF 50-55%, severe lateral HK. Gr1 DD, mildly dil LA. Mildly reduced RV fxn.  . 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    "got booster shot a few times"  . GERD (gastroesophageal reflux disease)   . Hyperlipidemia   . Hypertensive heart disease   . Menopausal state   . Morbid obesity (HCC)   . Noncompliance     Past Surgical History:  Procedure Laterality Date  . CARDIAC CATHETERIZATION N/A 06/08/2015   Procedure: Left Heart Cath and Coronary Angiography;  Surgeon: Jennifer Records, MD;  Location: [facility] INVASIVE CV LAB;  Service: Cardiovascular;  Laterality: N/A;  . CARDIAC CATHETERIZATION N/A 06/08/2015   Procedure: Coronary Stent Intervention;  Surgeon: Jennifer Records, MD;  Location: [facility] INVASIVE CV LAB;  Service: Cardiovascular;  Laterality: N/A;  . CARDIAC CATHETERIZATION N/A 07/16/2015   Procedure: Coronary Stent Intervention;  Surgeon: Jennifer Records, MD;  Location: [facility] INVASIVE CV LAB;  Service: Cardiovascular;  Laterality: N/A;  . CORONARY ANGIOPLASTY WITH STENT PLACEMENT     "6 stents before 05/2015"  . KNEE ARTHROSCOPY Left 1990's  . TUBAL LIGATION  ~ 1978    Current Medications: Current Meds  Medication Sig  . amLODipine (NORVASC) 10 MG tablet Take 1 tablet (10 mg total) by mouth daily.  Marland Kitchen aspirin EC 81 MG tablet Take 81 mg by mouth daily.  . clopidogrel (PLAVIX) 75 MG tablet Take 1 tablet (75 mg total) by mouth daily.  . hydrochlorothiazide (HYDRODIURIL) 25 MG tablet Take 1 tablet (25 mg total) by mouth daily.  . Infant Care Products (DERMACLOUD) CREA Apply 1 application topically daily.  . isosorbide mononitrate (IMDUR) 60 MG 24 hr tablet Take 1 tablet (60 mg total) by mouth daily.  Marland Kitchen losartan (COZAAR) 100 MG tablet Take 1 tablet (100 mg total) by mouth daily.  Marland Kitchen lovastatin (MEVACOR)  20 MG tablet Take 1 tablet (20 mg total) by mouth at bedtime.  . metoprolol tartrate (LOPRESSOR) 50 MG tablet Take 50 mg by mouth daily.  . nitroGLYCERIN (NITROSTAT) 0.4 MG SL tablet DISSOLVE ONE TABLET UNDER THE TONGUE EVERY 5 MINUTES AS NEEDED FOR CHEST PAIN.  DO NOT EXCEED A TOTAL OF 3 DOSES IN 15 MINUTES  . potassium chloride SA (KLOR-CON) 20 MEQ tablet Take 1 tablet (20 mEq total) by mouth daily.     Allergies:   Brilinta [ticagrelor], Lipitor [atorvastatin], Latex, Penicillins, and Tekturna [aliskiren]   Social History   Socioeconomic History  . Marital status: Widowed    Spouse name: Not on  file  . Number of children: Not on file  . Years of education: Not on file  . Highest education level: 11th grade  Occupational History  . Occupation: retired  Tobacco Use  . Smoking status: Never Smoker  . Smokeless tobacco: Never Used  Substance and Sexual Activity  . Alcohol use: No  . Drug use: No  . Sexual activity: Never    Birth control/protection: Surgical  Other Topics Concern  . Not on file  Social History Narrative  . Not on file   Social Determinants of Health   Financial Resource Strain:   . Difficulty of Paying Living Expenses:   Food Insecurity:   . Worried About Programme researcher, broadcasting/film/video in the Last Year:   . Barista in the Last Year:   Transportation Needs:   . Freight forwarder (Medical):   Marland Kitchen Lack of Transportation (Non-Medical):   Physical Activity:   . Days of Exercise per Week:   . Minutes of Exercise per Session:   Stress:   . Feeling of Stress :   Social Connections:   . Frequency of Communication with Friends and Family:   . Frequency of Social Gatherings with Friends and Family:   . Attends Religious Services:   . Active Member of Clubs or Organizations:   . Attends Banker Meetings:   Marland Kitchen Marital Status:      Family History: The patient's family history includes Healthy in her brother and mother; Kidney disease in her father; Prostate cancer in her father. There is no history of Cancer, Diabetes, or Heart disease.  ROS:   Please see the history of present illness.    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  She does notice some shortness of breath when she climbs stairs.  All other systems reviewed and are negative.  EKGs/Labs/Other Studies Reviewed:    The following studies were reviewed today: No recent imaging or functional data.  EKG:  EKG the electrocardiogram reveals sinus bradycardia, nonspecific T wave flattening, normal PR interval.  When compared to the  prior tracing from 2019, no significant changes noted.  Recent Labs: 09/04/2019: ALT 12; BUN 11; Creatinine, Ser 0.68; Hemoglobin 12.9; Platelets 209; Potassium 3.6; Sodium 135; TSH 3.240  Recent Lipid Panel    Component Value Date/Time   CHOL 205 (H) 09/04/2019 1034   CHOL 206 (H) 12/28/2017 1356   TRIG 109 09/04/2019 1034   TRIG 136 12/28/2017 1356   HDL 43 09/04/2019 1034   CHOLHDL 4.5 (H) 08/09/2018 1521   CHOLHDL 3.8 07/16/2015 0733   VLDL 27 12/28/2017 1356   LDLCALC 142 (H) 09/04/2019 1034    Physical Exam:    VS:  BP (!) 152/98  Pulse (!) 53   Ht 5' 2.4" (1.585 m)   Wt 232 lb 12.8 oz (105.6 kg)   LMP  (LMP Unknown)   SpO2 98%   BMI 42.04 kg/m     Wt Readings from Last 3 Encounters:  11/22/19 232 lb 12.8 oz (105.6 kg)  09/04/19 231 lb (104.8 kg)  07/11/19 230 lb 3.2 oz (104.4 kg)     GEN: Obese. No acute distress HEENT: Normal NECK: No JVD. LYMPHATICS: No lymphadenopathy CARDIAC:  RRR without murmur, gallop, or edema. VASCULAR:  Normal Pulses. No bruits. RESPIRATORY:  Clear to auscultation without rales, wheezing or rhonchi  ABDOMEN: Soft, non-tender, non-distended, No pulsatile mass, MUSCULOSKELETAL: No deformity  SKIN: Warm and dry NEUROLOGIC:  Alert and oriented x 3 PSYCHIATRIC:  Normal affect   ASSESSMENT:    1. 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 Essential hypertension   3. Hyperlipidemia, unspecified hyperlipidemia type   4. Elevated hemoglobin A1c   5. Chronic diastolic heart failure (HCC)   6. Educated about COVID-19 virus infection    PLAN:    In order of problems listed above:  1. I cannot tell that she is currently having angina.  Please.  Please review risk factor modification for purposes of secondary prevention.  The most recent labs reveal a total cholesterol of 205 in January.  I do not have an LDL value.  She does get physical activity and by virtue of her job.  She is not  on a specific diet. 2. Blood pressure is significantly elevated and on repeat was 138/92 mmHg.  I have added Aldactone 12.5 mg/day. 3. Target LDL should be less than 70.  We will try to obtain the full laboratory data from September 04, 2019 which should include an LDL.  She is asked to continue taking lovastatin 20 mg daily.  If LDL is not well controlled, will transition to atorvastatin or rosuvastatin. 4. The most recent hemoglobin A1c was 5.9.  I encouraged low carbohydrate diet and physical activity with weight loss. 5. No evidence of volume overload on exam.  She does have peripheral edema but this is in the absence of neck vein distention in a patient who is on 10 mg of amlodipine. 6. She does practice social distancing and mask wearing.  She has gotten the COVID-19 vaccine.  Return in 2 weeks with basic metabolic panel and for follow-up blood pressure check.  We need to try to locate the recent LDL   Medication Adjustments/Labs and Tests Ordered: Current medicines are reviewed at length with the patient today.  Concerns regarding medicines are outlined above.  Orders Placed This Encounter  Procedures  . EKG 12-Lead   Meds ordered this encounter  Medications  . spironolactone (ALDACTONE) 25 MG tablet    Sig: Take 0.5 tablets (12.5 mg total) by mouth daily.    Dispense:  45 tablet    Refill:  3    Patient Instructions  Medication Instructions:  1) START Spironolactone 12.5mg  once daily  *If you need a refill on your cardiac medications before your next appointment, please call your pharmacy*   Lab Work: BMET when you come for your next appointment  If you have labs (blood work) drawn today and your tests are completely normal, you will receive your results only by: Marland Kitchen MyChart Message (if you have MyChart) OR . A paper copy in the mail If you have any lab test that is abnormal  or we need to change your treatment, we will call you to review the  results.   Testing/Procedures: None   Follow-Up: At [facility], you and your health needs are our priority.  As part of our continuing mission to provide you with exceptional heart care, we have created designated Provider Care Teams.  These Care Teams include your primary Cardiologist (physician) and Advanced Practice Providers (APPs -  Physician Assistants and Nurse Practitioners) who all work together to provide you with the care you need, when you need it.  We recommend signing up for the patient portal called "MyChart".  Sign up information is provided on this After Visit Summary.  MyChart is used to connect with patients for Virtual Visits (Telemedicine).  Patients are able to view lab/test results, encounter notes, upcoming appointments, etc.  Non-urgent messages can be sent to your provider as well.   To learn more about what you can do with MyChart, go to [email or web address].    Your next appointment:   2 week(s)  The format for your next appointment:   In Person  Provider:   You may see Sinclair Grooms, MD or one of the following Advanced Practice Providers on your designated Care Team:    Truitt Merle, NP  Cecilie Kicks, NP  Kathyrn Drown, NP    Other Instructions      Signed, Sinclair Grooms, MD  11/22/2019 5:06 PM    [place]

## 2019-11-22 ENCOUNTER — Other Ambulatory Visit: Payer: Self-pay

## 2019-11-22 ENCOUNTER — Ambulatory Visit (INDEPENDENT_AMBULATORY_CARE_PROVIDER_SITE_OTHER): Payer: Medicare HMO | Admitting: Interventional Cardiology

## 2019-11-22 ENCOUNTER — Encounter: Payer: Self-pay | Admitting: Interventional Cardiology

## 2019-11-22 ENCOUNTER — Other Ambulatory Visit: Payer: Self-pay | Admitting: Family Medicine

## 2019-11-22 VITALS — BP 152/98 | HR 53 | Ht 62.4 in | Wt 232.8 lb

## 2019-11-22 DIAGNOSIS — R7309 Other abnormal glucose: Secondary | ICD-10-CM

## 2019-11-22 DIAGNOSIS — I10 Essential (primary) hypertension: Secondary | ICD-10-CM | POA: Diagnosis not present

## 2019-11-22 DIAGNOSIS — Z7189 Other specified counseling: Secondary | ICD-10-CM

## 2019-11-22 DIAGNOSIS — I25118 Atherosclerotic heart disease of native coronary artery with other forms of angina pectoris: Secondary | ICD-10-CM

## 2019-11-22 DIAGNOSIS — I5032 Chronic diastolic (congestive) heart failure: Secondary | ICD-10-CM | POA: Diagnosis not present

## 2019-11-22 DIAGNOSIS — E785 Hyperlipidemia, unspecified: Secondary | ICD-10-CM

## 2019-11-22 MED ORDER — SPIRONOLACTONE 25 MG PO TABS: 12.5000 mg | ORAL_TABLET | Freq: Every day | ORAL | 3 refills | Status: DC

## 2019-11-22 NOTE — Patient Instructions (Signed)
Medication Instructions:  1) START Spironolactone 12.5mg  once daily  *If you need a refill on your cardiac medications before your next appointment, please call your pharmacy*   Lab Work: BMET when you come for your next appointment  If you have labs (blood work) drawn today and your tests are completely normal, you will receive your results only by: Marland Kitchen MyChart Message (if you have MyChart) OR . A paper copy in the mail If you have any lab test that is abnormal or we need to change your treatment, we will call you to review the results.   Testing/Procedures: None   Follow-Up: At [facility], you and your health needs are our priority.  As part of our continuing mission to provide you with exceptional heart care, we have created designated Provider Care Teams.  These Care Teams include your primary Cardiologist (physician) and Advanced Practice Providers (APPs -  Physician Assistants and Nurse Practitioners) who all work together to provide you with the care you need, when you need it.  We recommend signing up for the patient portal called "MyChart".  Sign up information is provided on this After Visit Summary.  MyChart is used to connect with patients for Virtual Visits (Telemedicine).  Patients are able to view lab/test results, encounter notes, upcoming appointments, etc.  Non-urgent messages can be sent to your provider as well.   To learn more about what you can do with MyChart, go to [email or web address].    Your next appointment:   2 week(s)  The format for your next appointment:   In Person  Provider:   You may see Lesleigh Noe, MD or one of the following Advanced Practice Providers on your designated Care Team:    Norma Fredrickson, NP  Nada Boozer, NP  Georgie Chard, NP    Other Instructions

## 2019-11-22 NOTE — Telephone Encounter (Signed)
Requested medication (s) are due for refill today: yes  Requested medication (s) are on the active medication list: yes  Last refill:  06/18/19  Future visit scheduled: yes  Notes to clinic:  cardiovascular: Nitrates failed  Requested Prescriptions  Pending Prescriptions Disp Refills   isosorbide mononitrate (IMDUR) 60 MG 24 hr tablet [Pharmacy Med Name: Isosorbide Mononitrate ER 60 MG Oral Tablet Extended Release 24 Hour] 90 tablet 0    Sig: Take 1 tablet by mouth once daily      Cardiovascular:  Nitrates Failed - 11/22/2019  5:56 PM      Failed - Last BP in normal range    BP Readings from Last 1 Encounters:  11/22/19 (!) 152/98          Passed - Last Heart Rate in normal range    Pulse Readings from Last 1 Encounters:  11/22/19 (!) 53          Passed - Valid encounter within last 12 months    Recent Outpatient Visits           2 months ago Annual physical exam   [facility] [place], Levant T, NP   9 months ago Essential hypertension   [facility], Redge Gainer, MD   1 year ago Need for influenza vaccination   [facility] Steele Sizer, MD   1 year ago Gastroesophageal reflux disease, esophagitis presence not specified   [facility] Trey Sailors, PA-C   1 year ago Essential hypertension   [facility] [place], Redge Gainer, MD       Future Appointments             In 1 week Cannady, Dorie Rank, NP [employer], [facility]   In 2 weeks Katrinka Blazing, Barry Dienes, MD [facility] [employer], [facility]

## 2019-11-23 ENCOUNTER — Other Ambulatory Visit: Payer: Self-pay

## 2019-11-23 DIAGNOSIS — I10 Essential (primary) hypertension: Secondary | ICD-10-CM

## 2019-11-23 DIAGNOSIS — I2510 Atherosclerotic heart disease of native coronary artery without angina pectoris: Secondary | ICD-10-CM

## 2019-11-23 MED ORDER — NITROGLYCERIN 0.4 MG SL SUBL: SUBLINGUAL_TABLET | SUBLINGUAL | 0 refills | Status: DC

## 2019-11-23 MED ORDER — HYDROCHLOROTHIAZIDE 25 MG PO TABS: 25.0000 mg | ORAL_TABLET | Freq: Every day | ORAL | 4 refills | Status: DC

## 2019-11-23 MED ORDER — CLOPIDOGREL BISULFATE 75 MG PO TABS: 75.0000 mg | ORAL_TABLET | Freq: Every day | ORAL | 4 refills | Status: DC

## 2019-11-23 MED ORDER — AMLODIPINE BESYLATE 10 MG PO TABS: 10.0000 mg | ORAL_TABLET | Freq: Every day | ORAL | 4 refills | Status: DC

## 2019-11-23 MED ORDER — LOSARTAN POTASSIUM 100 MG PO TABS: 100.0000 mg | ORAL_TABLET | Freq: Every day | ORAL | 4 refills | Status: DC

## 2019-11-23 MED ORDER — LOVASTATIN 20 MG PO TABS: 20.0000 mg | ORAL_TABLET | Freq: Every day | ORAL | 4 refills | Status: DC

## 2019-11-23 NOTE — Telephone Encounter (Signed)
LOV:09/04/2019, NOV: 12/04/2019 

## 2019-11-23 NOTE — Telephone Encounter (Signed)
LOV:09/04/2019, NOV: 12/04/2019

## 2019-12-04 ENCOUNTER — Encounter: Payer: Self-pay | Admitting: Nurse Practitioner

## 2019-12-04 ENCOUNTER — Ambulatory Visit (INDEPENDENT_AMBULATORY_CARE_PROVIDER_SITE_OTHER): Payer: Medicare HMO | Admitting: Nurse Practitioner

## 2019-12-04 ENCOUNTER — Other Ambulatory Visit: Payer: Self-pay

## 2019-12-04 ENCOUNTER — Telehealth: Payer: Self-pay

## 2019-12-04 ENCOUNTER — Encounter: Payer: Self-pay | Admitting: Family Medicine

## 2019-12-04 ENCOUNTER — Ambulatory Visit: Payer: Self-pay | Admitting: Pharmacist

## 2019-12-04 DIAGNOSIS — I10 Essential (primary) hypertension: Secondary | ICD-10-CM

## 2019-12-04 DIAGNOSIS — E559 Vitamin D deficiency, unspecified: Secondary | ICD-10-CM | POA: Diagnosis not present

## 2019-12-04 DIAGNOSIS — R7309 Other abnormal glucose: Secondary | ICD-10-CM | POA: Diagnosis not present

## 2019-12-04 DIAGNOSIS — I252 Old myocardial infarction: Secondary | ICD-10-CM | POA: Diagnosis not present

## 2019-12-04 DIAGNOSIS — I5032 Chronic diastolic (congestive) heart failure: Secondary | ICD-10-CM

## 2019-12-04 DIAGNOSIS — E782 Mixed hyperlipidemia: Secondary | ICD-10-CM | POA: Diagnosis not present

## 2019-12-04 LAB — MICROALBUMIN, URINE WAIVED
Creatinine, Urine Waived: 10 mg/dL (ref 10–300)
Microalb, Ur Waived: 10 mg/L (ref 0–19)
Microalb/Creat Ratio: <30 mg/g (ref <30)

## 2019-12-04 NOTE — Assessment & Plan Note (Signed)
Chronic, ongoing.  Recommend ensuring medication taken daily + checking BP at home at least a few mornings a week.  Continue collaboration with cardiology and review notes.  Continue current medication regimen at this time, suspect adjustments will need to be made in future.  CCM referral is in place.  Obtain labs today.  Return in 6 months. 

## 2019-12-04 NOTE — Progress Notes (Signed)
Did not leave detailed message per dpr. Sent letter.

## 2019-12-04 NOTE — Assessment & Plan Note (Signed)
In 2004, continue current medication regimen for prevention and collaboration with cardiology. 

## 2019-12-04 NOTE — Assessment & Plan Note (Signed)
Ongoing, recommend continue daily supplement and adjust as needed.  Recheck level today. 

## 2019-12-04 NOTE — Progress Notes (Signed)
LMP  (LMP Unknown)    Subjective:    Patient ID: Jennifer Grant, female    DOB: 1951/05/24, 69 y.o.   MRN: [id number]  HPI: Jennifer Grant is a 69 y.o. female  Chief Complaint  Patient presents with  . Hyperlipidemia  . Hypertension  . Prediabetes    . This visit was completed via telephone due to the restrictions of the COVID-19 pandemic. All issues as above were discussed and addressed but no physical exam was performed. If it was felt that the patient should be evaluated in the office, they were directed there. The patient verbally consented to this visit. Patient was unable to complete an audio/visual visit due to Technical difficulties,Lack of internet. Due to the catastrophic nature of the COVID-19 pandemic, this visit was done through audio contact only. . Location of the patient: home . Location of the provider: home . Those involved with this call:  . Provider: Aura Dials, DNP . CMA: Wilhemena Durie, CMA . Front Desk/Registration: Adela Ports  . Time spent on call: 15 minutes on the phone discussing health concerns. 10 minutes total spent in review of patient's record and preparation of their chart.  . I verified patient identity using two factors (patient name and date of birth). Patient consents verbally to being seen via telemedicine visit today.    HYPERTENSION / HYPERLIPIDEMIA/HF Currently taking Losartan, Amlodipine, HCTZ, Isosorbide Mono, Plavix, and ASA + Lovastatin.  Last saw cardiology on 11/22/2019, had not seen prior to this since 2019. On review of notes appears non adherence and loss of follow-up is major barrier. At recent cardiology visit Aldactone was added on to regimen 12.5 MG daily and it was recommended that if LDL remains elevated >70 on next check to transition to Atorvastatin or Rosuvastatin.  2016 Echo 50-55%, chronic diastolic HF diagnosis on chart.  Diet is a barrier for her, she does endorse enjoying foods higher in sodium. No recent  NTG use.  History of MI in 2004. Satisfied with current treatment? yes Duration of hypertension: chronic BP monitoring frequency: not checking BP range:  BP medication side effects: no Duration of hyperlipidemia: chronic Cholesterol medication side effects: no Cholesterol supplements: none Medication compliance: good compliance Aspirin: yes Recent stressors: no Recurrent headaches: no Visual changes: no Palpitations: no Dyspnea: none Chest pain: no Lower extremity edema: no Dizzy/lightheaded: no  The ASCVD Risk score Denman George DC Jr., et al., 2013) failed to calculate for the following reasons:   The patient has a prior MI or stroke diagnosis  ELEVATED A1C: Most recent A1C 5.9% trending down from previous of 6.1% several years ago, 2017. She does endorse eating lots of Jamaican sweet rolls over past months and poor diet choices.  Polydipsia/polyuria: no Visual disturbance: no Chest pain: no Paresthesias: no  VITAMIN D DEFICIENCY: On recent labs her Vit D was 22.4 and it was recommended she start taking daily Vitamin D3 1000 units.  She denies any muscle aches, fatigue, recent falls or fractures.   Relevant past medical, surgical, family and social history reviewed and updated as indicated. Interim medical history since our last visit reviewed. Allergies and medications reviewed and updated.  Review of Systems  Constitutional: Negative for activity change, appetite change, diaphoresis, fatigue and fever.  Respiratory: Negative for cough, chest tightness and shortness of breath.   Cardiovascular: Negative for chest pain, palpitations and leg swelling.  Gastrointestinal: Negative.   Endocrine: Negative for polydipsia, polyphagia and polyuria.  Neurological: Negative.   Psychiatric/Behavioral: Negative.  Per HPI unless specifically indicated above     Objective:    LMP  (LMP Unknown)   Wt Readings from Last 3 Encounters:  11/22/19 232 lb 12.8 oz (105.6 kg)  09/04/19  231 lb (104.8 kg)  07/11/19 230 lb 3.2 oz (104.4 kg)    Physical Exam   Unable to perform due to telephone visit only  Results for orders placed or performed in visit on 09/04/19  CBC with Differential/Platelet out  Result Value Ref Range   WBC 6.4 3.4 - 10.8 x10E3/uL   RBC 4.98 3.77 - 5.28 x10E6/uL   Hemoglobin 12.9 11.1 - 15.9 g/dL   Hematocrit 58.0 99.8 - 46.6 %   MCV 82 79 - 97 fL   MCH 25.9 (L) 26.6 - 33.0 pg   MCHC 31.7 31.5 - 35.7 g/dL   RDW 33.8 25.0 - 53.9 %   Platelets 209 150 - 450 x10E3/uL   Neutrophils 45 Not Estab. %   Lymphs 44 Not Estab. %   Monocytes 7 Not Estab. %   Eos 3 Not Estab. %   Basos 1 Not Estab. %   Neutrophils Absolute 3.0 1.4 - 7.0 x10E3/uL   Lymphocytes Absolute 2.8 0.7 - 3.1 x10E3/uL   Monocytes Absolute 0.4 0.1 - 0.9 x10E3/uL   EOS (ABSOLUTE) 0.2 0.0 - 0.4 x10E3/uL   Basophils Absolute 0.0 0.0 - 0.2 x10E3/uL   Immature Granulocytes 0 Not Estab. %   Immature Grans (Abs) 0.0 0.0 - 0.1 x10E3/uL  Comprehensive metabolic panel  Result Value Ref Range   Glucose 91 65 - 99 mg/dL   BUN 11 8 - 27 mg/dL   Creatinine, Ser 7.67 0.57 - 1.00 mg/dL   GFR calc non Af Amer 89 >59 mL/min/1.73   GFR calc Af Amer 102 >59 mL/min/1.73   BUN/Creatinine Ratio 16 12 - 28   Sodium 135 134 - 144 mmol/L   Potassium 3.6 3.5 - 5.2 mmol/L   Chloride 96 96 - 106 mmol/L   CO2 25 20 - 29 mmol/L   Calcium 9.9 8.7 - 10.3 mg/dL   Total Protein 7.3 6.0 - 8.5 g/dL   Albumin 4.0 3.8 - 4.8 g/dL   Globulin, Total 3.3 1.5 - 4.5 g/dL   Albumin/Globulin Ratio 1.2 1.2 - 2.2   Bilirubin Total 0.5 0.0 - 1.2 mg/dL   Alkaline Phosphatase 73 39 - 117 IU/L   AST 20 0 - 40 IU/L   ALT 12 0 - 32 IU/L  TSH  Result Value Ref Range   TSH 3.240 0.450 - 4.500 uIU/mL  Lipid Panel w/o Chol/HDL Ratio out  Result Value Ref Range   Cholesterol, Total 205 (H) 100 - 199 mg/dL   Triglycerides 341 0 - 149 mg/dL   HDL 43 >93 mg/dL   VLDL Cholesterol Cal 20 5 - 40 mg/dL   LDL Chol Calc (NIH)  142 (H) 0 - 99 mg/dL  Vit D  25 hydroxy (rtn osteoporosis monitoring)  Result Value Ref Range   Vit D, 25-Hydroxy 22.4 (L) 30.0 - 100.0 ng/mL  HgB A1c  Result Value Ref Range   Hgb A1c MFr Bld 5.9 (H) 4.8 - 5.6 %   Est. average glucose Bld gHb Est-mCnc 123 mg/dL      Assessment & Plan:   Problem List Items Addressed This Visit      Cardiovascular and Mediastinum   Old MI (myocardial infarction)    In 2004, continue current medication regimen for prevention and collaboration with cardiology.  Essential hypertension    Chronic, ongoing.  Recommend ensuring medication taken daily + checking BP at home at least a few mornings a week.  Continue collaboration with cardiology and review notes.  Continue current medication regimen at this time, suspect adjustments will need to be made in future.  CCM referral is in place.  Obtain labs today.  Return in 6 months.      Relevant Orders   Comprehensive metabolic panel   Chronic diastolic heart failure (HCC) - Primary    Chronic, ongoing.  Continue current medication regimen and collaboration with cardiology.  Discussed diet choices.   - Reminded to call for an overnight weight gain of >2 pounds or a weekly weight weight of >5 pounds - not adding salt to his food and has been reading food labels. Reviewed the importance of keeping daily sodium intake to 2000mg  daily         Other   Hyperlipidemia    Chronic, ongoing.  Continue current medication regimen and adjust as needed.  Lipid panel today and if elevation of LDL >70 will plan on change from Lovastatin to either Atorvastatin or Crestor.  Have recommended she take this daily to help with prevention.  Discussed this at length with patient.      Relevant Orders   Lipid Panel w/o Chol/HDL Ratio   Elevated hemoglobin A1c    Recheck A1C today and initiate medication if indicated.  Discussed at length diet choices.      Relevant Orders   HgB A1c   Microalbumin, Urine Waived    Vitamin D deficiency    Ongoing, recommend continue daily supplement and adjust as needed.  Recheck level today.      Relevant Orders   VITAMIN D 25 Hydroxy (Vit-D Deficiency, Fractures)      I discussed the assessment and treatment plan with the patient. The patient was provided an opportunity to ask questions and all were answered. The patient agreed with the plan and demonstrated an understanding of the instructions.   The patient was advised to call back or seek an in-person evaluation if the symptoms worsen or if the condition fails to improve as anticipated.   I provided 15+ minutes of time during this encounter.  Follow up plan: Return in about 6 months (around 06/04/2020) for HTN/HLD, HF, elevated A1C, Vit D deficiency.

## 2019-12-04 NOTE — Patient Instructions (Signed)
DASH Eating Plan DASH stands for "Dietary Approaches to Stop Hypertension." The DASH eating plan is a healthy eating plan that has been shown to reduce high blood pressure (hypertension). It may also reduce your risk for type 2 diabetes, heart disease, and stroke. The DASH eating plan may also help with weight loss. What are tips for following this plan?  General guidelines  Avoid eating more than 2,300 mg (milligrams) of salt (sodium) a day. If you have hypertension, you may need to reduce your sodium intake to 1,500 mg a day.  Limit alcohol intake to no more than 1 drink a day for nonpregnant women and 2 drinks a day for men. One drink equals 12 oz of beer, 5 oz of wine, or 1 oz of hard liquor.  Work with your health care provider to maintain a healthy body weight or to lose weight. Ask what an ideal weight is for you.  Get at least 30 minutes of exercise that causes your heart to beat faster (aerobic exercise) most days of the week. Activities may include walking, swimming, or biking.  Work with your health care provider or diet and nutrition specialist (dietitian) to adjust your eating plan to your individual calorie needs. Reading food labels   Check food labels for the amount of sodium per serving. Choose foods with less than 5 percent of the Daily Value of sodium. Generally, foods with less than 300 mg of sodium per serving fit into this eating plan.  To find whole grains, look for the word "whole" as the first word in the ingredient list. Shopping  Buy products labeled as "low-sodium" or "no salt added."  Buy fresh foods. Avoid canned foods and premade or frozen meals. Cooking  Avoid adding salt when cooking. Use salt-free seasonings or herbs instead of table salt or sea salt. Check with your health care provider or pharmacist before using salt substitutes.  Do not fry foods. Cook foods using healthy methods such as baking, boiling, grilling, and broiling instead.  Cook with  heart-healthy oils, such as olive, canola, soybean, or sunflower oil. Meal planning  Eat a balanced diet that includes: ? 5 or more servings of fruits and vegetables each day. At each meal, try to fill half of your plate with fruits and vegetables. ? Up to 6-8 servings of whole grains each day. ? Less than 6 oz of lean meat, poultry, or fish each day. A 3-oz serving of meat is about the same size as a deck of cards. One egg equals 1 oz. ? 2 servings of low-fat dairy each day. ? A serving of nuts, seeds, or beans 5 times each week. ? Heart-healthy fats. Healthy fats called Omega-3 fatty acids are found in foods such as flaxseeds and coldwater fish, like sardines, salmon, and mackerel.  Limit how much you eat of the following: ? Canned or prepackaged foods. ? Food that is high in trans fat, such as fried foods. ? Food that is high in saturated fat, such as fatty meat. ? Sweets, desserts, sugary drinks, and other foods with added sugar. ? Full-fat dairy products.  Do not salt foods before eating.  Try to eat at least 2 vegetarian meals each week.  Eat more home-cooked food and less restaurant, buffet, and fast food.  When eating at a restaurant, ask that your food be prepared with less salt or no salt, if possible. What foods are recommended? The items listed may not be a complete list. Talk with your dietitian about   what dietary choices are best for you. Grains Whole-grain or whole-wheat bread. Whole-grain or whole-wheat pasta. Brown rice. Oatmeal. Quinoa. Bulgur. Whole-grain and low-sodium cereals. Pita bread. Low-fat, low-sodium crackers. Whole-wheat flour tortillas. Vegetables Fresh or frozen vegetables (raw, steamed, roasted, or grilled). Low-sodium or reduced-sodium tomato and vegetable juice. Low-sodium or reduced-sodium tomato sauce and tomato paste. Low-sodium or reduced-sodium canned vegetables. Fruits All fresh, dried, or frozen fruit. Canned fruit in natural juice (without  added sugar). Meat and other protein foods Skinless chicken or turkey. Ground chicken or turkey. Pork with fat trimmed off. Fish and seafood. Egg whites. Dried beans, peas, or lentils. Unsalted nuts, nut butters, and seeds. Unsalted canned beans. Lean cuts of beef with fat trimmed off. Low-sodium, lean deli meat. Dairy Low-fat (1%) or fat-free (skim) milk. Fat-free, low-fat, or reduced-fat cheeses. Nonfat, low-sodium ricotta or cottage cheese. Low-fat or nonfat yogurt. Low-fat, low-sodium cheese. Fats and oils Soft margarine without trans fats. Vegetable oil. Low-fat, reduced-fat, or light mayonnaise and salad dressings (reduced-sodium). Canola, safflower, olive, soybean, and sunflower oils. Avocado. Seasoning and other foods Herbs. Spices. Seasoning mixes without salt. Unsalted popcorn and pretzels. Fat-free sweets. What foods are not recommended? The items listed may not be a complete list. Talk with your dietitian about what dietary choices are best for you. Grains Baked goods made with fat, such as croissants, muffins, or some breads. Dry pasta or rice meal packs. Vegetables Creamed or fried vegetables. Vegetables in a cheese sauce. Regular canned vegetables (not low-sodium or reduced-sodium). Regular canned tomato sauce and paste (not low-sodium or reduced-sodium). Regular tomato and vegetable juice (not low-sodium or reduced-sodium). Pickles. Olives. Fruits Canned fruit in a light or heavy syrup. Fried fruit. Fruit in cream or butter sauce. Meat and other protein foods Fatty cuts of meat. Ribs. Fried meat. Bacon. Sausage. Bologna and other processed lunch meats. Salami. Fatback. Hotdogs. Bratwurst. Salted nuts and seeds. Canned beans with added salt. Canned or smoked fish. Whole eggs or egg yolks. Chicken or turkey with skin. Dairy Whole or 2% milk, cream, and half-and-half. Whole or full-fat cream cheese. Whole-fat or sweetened yogurt. Full-fat cheese. Nondairy creamers. Whipped toppings.  Processed cheese and cheese spreads. Fats and oils Butter. Stick margarine. Lard. Shortening. Ghee. Bacon fat. Tropical oils, such as coconut, palm kernel, or palm oil. Seasoning and other foods Salted popcorn and pretzels. Onion salt, garlic salt, seasoned salt, table salt, and sea salt. Worcestershire sauce. Tartar sauce. Barbecue sauce. Teriyaki sauce. Soy sauce, including reduced-sodium. Steak sauce. Canned and packaged gravies. Fish sauce. Oyster sauce. Cocktail sauce. Horseradish that you find on the shelf. Ketchup. Mustard. Meat flavorings and tenderizers. Bouillon cubes. Hot sauce and Tabasco sauce. Premade or packaged marinades. Premade or packaged taco seasonings. Relishes. Regular salad dressings. Where to find more information:  National Heart, Lung, and Blood Institute: www.nhlbi.nih.gov  American Heart Association: www.heart.org Summary  The DASH eating plan is a healthy eating plan that has been shown to reduce high blood pressure (hypertension). It may also reduce your risk for type 2 diabetes, heart disease, and stroke.  With the DASH eating plan, you should limit salt (sodium) intake to 2,300 mg a day. If you have hypertension, you may need to reduce your sodium intake to 1,500 mg a day.  When on the DASH eating plan, aim to eat more fresh fruits and vegetables, whole grains, lean proteins, low-fat dairy, and heart-healthy fats.  Work with your health care provider or diet and nutrition specialist (dietitian) to adjust your eating plan to your   individual calorie needs. This information is not intended to replace advice given to you by your health care provider. Make sure you discuss any questions you have with your health care provider. Document Revised: 07/29/2017 Document Reviewed: 08/09/2016 Elsevier Patient Education  2020 Elsevier Inc.  

## 2019-12-04 NOTE — Chronic Care Management (AMB) (Signed)
  Chronic Care Management   Note  12/04/2019 Name: YUVIA PLANT MRN: [id number] DOB: [birth year]  CHAU SAVELL is a 69 y.o. year old female who is a primary care patient of Crissman, Redge Gainer, MD. The CCM team was consulted for assistance with chronic disease management and care coordination needs.    Planned to meet with patient face to face today with PCP appointment, however, PCP appt was virtual. Talked to patient; rescheduled our phone call for 4 weeks from now.  Catie Feliz Beam, PharmD, [facility] Clinical Pharmacist [facility] [phone]

## 2019-12-04 NOTE — Assessment & Plan Note (Signed)
Chronic, ongoing.  Continue current medication regimen and collaboration with cardiology.  Discussed diet choices.   - Reminded to call for an overnight weight gain of >2 pounds or a weekly weight weight of >5 pounds - not adding salt to his food and has been reading food labels. Reviewed the importance of keeping daily sodium intake to <2000mg daily  

## 2019-12-04 NOTE — Assessment & Plan Note (Addendum)
Chronic, ongoing.  Continue current medication regimen and adjust as needed.  Lipid panel today and if elevation of LDL >70 will plan on change from Lovastatin to either Atorvastatin or Crestor.  Have recommended she take this daily to help with prevention.  Discussed this at length with patient.

## 2019-12-04 NOTE — Assessment & Plan Note (Signed)
Recheck A1C today and initiate medication if indicated.  Discussed at length diet choices. 

## 2019-12-05 ENCOUNTER — Other Ambulatory Visit: Payer: Self-pay | Admitting: Nurse Practitioner

## 2019-12-05 DIAGNOSIS — E782 Mixed hyperlipidemia: Secondary | ICD-10-CM

## 2019-12-05 LAB — COMPREHENSIVE METABOLIC PANEL
ALT: 15 IU/L (ref 0–32)
AST: 22 IU/L (ref 0–40)
Albumin/Globulin Ratio: 1.1 — ABNORMAL LOW (ref 1.2–2.2)
Albumin: 3.9 g/dL (ref 3.7–4.7)
Alkaline Phosphatase: 57 IU/L (ref 39–117)
BUN/Creatinine Ratio: 11 — ABNORMAL LOW (ref 12–28)
BUN: 9 mg/dL (ref 8–27)
Bilirubin Total: 0.4 mg/dL (ref 0.0–1.2)
CO2: 24 mmol/L (ref 20–29)
Calcium: 10.6 mg/dL — ABNORMAL HIGH (ref 8.7–10.3)
Chloride: 98 mmol/L (ref 96–106)
Creatinine, Ser: 0.82 mg/dL (ref 0.57–1.00)
GFR calc Af Amer: 83 mL/min/{1.73_m2} (ref >59)
GFR calc non Af Amer: 72 mL/min/{1.73_m2} (ref >59)
Globulin, Total: 3.5 g/dL (ref 1.5–4.5)
Glucose: 129 mg/dL — ABNORMAL HIGH (ref 65–99)
Potassium: 4 mmol/L (ref 3.5–5.2)
Sodium: 136 mmol/L (ref 134–144)
Total Protein: 7.4 g/dL (ref 6.0–8.5)

## 2019-12-05 LAB — LIPID PANEL W/O CHOL/HDL RATIO
Cholesterol, Total: 173 mg/dL (ref 100–199)
HDL: 40 mg/dL (ref >39)
LDL Chol Calc (NIH): 116 mg/dL — ABNORMAL HIGH (ref 0–99)
Triglycerides: 94 mg/dL (ref 0–149)
VLDL Cholesterol Cal: 17 mg/dL (ref 5–40)

## 2019-12-05 LAB — VITAMIN D 25 HYDROXY (VIT D DEFICIENCY, FRACTURES): Vit D, 25-Hydroxy: 40.7 ng/mL (ref 30.0–100.0)

## 2019-12-05 LAB — HEMOGLOBIN A1C
Est. average glucose Bld gHb Est-mCnc: 128 mg/dL
Hgb A1c MFr Bld: 6.1 % — ABNORMAL HIGH (ref 4.8–5.6)

## 2019-12-05 MED ORDER — ROSUVASTATIN CALCIUM 40 MG PO TABS: 40.0000 mg | ORAL_TABLET | Freq: Every day | ORAL | 3 refills | Status: DC

## 2019-12-05 NOTE — Progress Notes (Signed)
Good morning, please let Jennifer Grant know her labs have returned.  Cholesterol levels continue above goal, it would benefit to change from Lovastatin to Rosuvastatin (Crestor) at higher dose of 40 MG.  I will send this in once you receive this message, let me know if this is okay and if any questions.  You would stop taking Lovastatin and start taking the Rosuvastatin instead with plan to recheck lipid panel in 6 weeks on outpatient labs, please schedule appointment for this.  A1C, continues to show prediabetes at 6.1%, it is up a little from last time.  Last visit it was 5.9%, recommend heavy focus on diet changes to prevent this from entering diabetic range of 6.5% or greater.  Vitamin D level is normal, continue daily supplement and kidney function is normal.  Please let me know if any questions.  Thank you.

## 2019-12-05 NOTE — Progress Notes (Signed)
Cardiology Office Note:    Date:  12/06/2019   ID:  KALINA MORABITO, DOB 1951/08/27, MRN [id number]  PCP:  Zaleigh Roys, DO  Cardiologist:  Sinclair Grooms, MD   Referring MD: Guadalupe Maple, MD   Chief Complaint  Patient presents with  . Hypertension  . Coronary Artery Disease    History of Present Illness:    Jennifer Grant is a 69 y.o. female with a hx of coronary artery disease and recent acute infarction due to stent thrombosis. She has history of hypertension, hyperlipidemia, mitral valve prolapse, and chronic diastolic HF.  She is doing well since the addition of low-dose Aldactone.  She has not had syncope.  Blood pressure is better controlled.  Primary care has increased the intensity of Statin therapy.  No orthopnea, PND, lower extremity swelling, palpitations, or syncope has been noted.  Past Medical History:  Diagnosis Date  . CAD (coronary artery disease)    a. 2004 s/p MI w/ PCI/stenting to the LCX, OM1, OM2, RCA, RPDA; b. 05/2015 MI/PCI: 100 OM1 (2.75x20 & 3.0x8 Promus Premier DES'), PTCA OM2; c. 07/2015 Cath/Staged PCI: LM 60, LAD 75d, LCX 58mISR, OM1 ok, OM2 25 ISR, RCA 50p, 961m, 85d (3.0x28 Promus Premier DES).  . Diastolic dysfunction    a. 05/2015 Echo: EF 50-55%, severe lateral HK. Gr1 DD, mildly dil LA. Mildly reduced RV fxn.  . 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    "got booster shot a few times"  . GERD (gastroesophageal reflux disease)   . Hyperlipidemia   . Hypertensive heart disease   . Menopausal state   . Morbid obesity (HCCoates  . Noncompliance     Past Surgical History:  Procedure Laterality Date  . CARDIAC CATHETERIZATION N/A 06/08/2015   Procedure: Left Heart Cath and Coronary Angiography;  Surgeon: HeBelva CromeMD;  [facility];  Service: Cardiovascular;  Laterality: N/A;  . CARDIAC CATHETERIZATION N/A 06/08/2015   Procedure: Coronary Stent Intervention;  Surgeon: HeBelva CromeMD;  [facility];  Service:  Cardiovascular;  Laterality: N/A;  . CARDIAC CATHETERIZATION N/A 07/16/2015   Procedure: Coronary Stent Intervention;  Surgeon: HeBelva CromeMD;  [facility];  Service: Cardiovascular;  Laterality: N/A;  . CORONARY ANGIOPLASTY WITH STENT PLACEMENT     "6 stents before 05/2015"  . KNEE ARTHROSCOPY Left 1990's  . TUBAL LIGATION  ~ 1978    Current Medications: Current Meds  Medication Sig  . amLODipine (NORVASC) 10 MG tablet Take 1 tablet (10 mg total) by mouth daily.  . Marland Kitchenspirin EC 81 MG tablet Take 81 mg by mouth daily.  . clopidogrel (PLAVIX) 75 MG tablet Take 1 tablet (75 mg total) by mouth daily.  . hydrochlorothiazide (HYDRODIURIL) 25 MG tablet Take 1 tablet (25 mg total) by mouth daily.  . Infant Care Products (DERMACLOUD) CREA Apply 1 application topically daily.  . isosorbide mononitrate (IMDUR) 60 MG 24 hr tablet Take 1 tablet by mouth once daily  . losartan (COZAAR) 100 MG tablet Take 1 tablet (100 mg total) by mouth daily.  . metoprolol tartrate (LOPRESSOR) 50 MG tablet Take 50 mg by mouth daily.  . nitroGLYCERIN (NITROSTAT) 0.4 MG SL tablet DISSOLVE ONE TABLET UNDER THE TONGUE EVERY 5 MINUTES AS NEEDED FOR CHEST PAIN.  DO NOT EXCEED A TOTAL OF 3 DOSES IN 15 MINUTES  . potassium chloride SA (KLOR-CON) 20 MEQ tablet Take 1 tablet (20 mEq total) by mouth  daily.  . rosuvastatin (CRESTOR) 40 MG tablet Take 1 tablet (40 mg total) by mouth daily.  Marland Kitchen spironolactone (ALDACTONE) 25 MG tablet Take 0.5 tablets (12.5 mg total) by mouth daily.     Allergies:   Brilinta [ticagrelor], Lipitor [atorvastatin], Latex, Penicillins, and Tekturna [aliskiren]   Social History   Socioeconomic History  . Marital status: Widowed    Spouse name: Not on file  . Number of children: Not on file  . Years of education: Not on file  . Highest education level: 11th grade  Occupational History  . Occupation: retired  Tobacco Use  . Smoking status: Never Smoker  . Smokeless tobacco:  Never Used  Substance and Sexual Activity  . Alcohol use: No  . Drug use: No  . Sexual activity: Never    Birth control/protection: Surgical  Other Topics Concern  . Not on file  Social History Narrative  . Not on file   Social Determinants of Health   Financial Resource Strain:   . Difficulty of Paying Living Expenses:   Food Insecurity:   . Worried About Charity fundraiser in the Last Year:   . Arboriculturist in the Last Year:   Transportation Needs:   . Film/video editor (Medical):   Marland Kitchen Lack of Transportation (Non-Medical):   Physical Activity:   . Days of Exercise per Week:   . Minutes of Exercise per Session:   Stress:   . Feeling of Stress :   Social Connections:   . Frequency of Communication with Friends and Family:   . Frequency of Social Gatherings with Friends and Family:   . Attends Religious Services:   . Active Member of Clubs or Organizations:   . Attends Archivist Meetings:   Marland Kitchen Marital Status:      Family History: The patient's family history includes Healthy in her brother and mother; Kidney disease in her father; Prostate cancer in her father. There is no history of Cancer, Diabetes, or Heart disease.  ROS:   Please see the history of present illness.    She denies any new issues since the office visit 1 week ago.  All other systems reviewed and are negative.  EKGs/Labs/Other Studies Reviewed:    The following studies were reviewed today: No new data  EKG:  EKG not performed  Recent Labs: 09/04/2019: Hemoglobin 12.9; Platelets 209; TSH 3.240 12/04/2019: ALT 15; BUN 9; Creatinine, Ser 0.82; Potassium 4.0; Sodium 136  Recent Lipid Panel    Component Value Date/Time   CHOL 173 12/04/2019 1451   CHOL 206 (H) 12/28/2017 1356   TRIG 94 12/04/2019 1451   TRIG 136 12/28/2017 1356   HDL 40 12/04/2019 1451   CHOLHDL 4.5 (H) 08/09/2018 1521   CHOLHDL 3.8 07/16/2015 0733   VLDL 27 12/28/2017 1356   LDLCALC 116 (H) 12/04/2019 1451     Physical Exam:    VS:  BP (!) 142/88   Pulse 60   Ht 5' 2.4" (1.585 m)   Wt 231 lb 9.6 oz (105.1 kg)   LMP  (LMP Unknown)   SpO2 98%   BMI 41.82 kg/m     Wt Readings from Last 3 Encounters:  12/06/19 231 lb 9.6 oz (105.1 kg)  11/22/19 232 lb 12.8 oz (105.6 kg)  09/04/19 231 lb (104.8 kg)     GEN: Morbid obesity. No acute distress HEENT: Normal NECK: No JVD. LYMPHATICS: No lymphadenopathy CARDIAC: 2/6 right upper sternal systolic murmur compatible with aortic  valve disease.  RRR without diastolic murmur, gallop, or edema. VASCULAR:  Normal Pulses. No bruits. RESPIRATORY:  Clear to auscultation without rales, wheezing or rhonchi  ABDOMEN: Soft, non-tender, non-distended, No pulsatile mass, MUSCULOSKELETAL: No deformity  SKIN: Warm and dry NEUROLOGIC:  Alert and oriented x 3 PSYCHIATRIC:  Normal affect   ASSESSMENT:    1. Coronary artery disease of native artery of native heart with stable angina pectoris ([place])   2. Essential hypertension   3. Hyperlipidemia, unspecified hyperlipidemia type   4. Chronic diastolic heart failure (HCC)   5. Elevated hemoglobin A1c   6. Educated about COVID-19 virus infection    PLAN:    In order of problems listed above:  1. Secondary prevention is really reviewed. 2. Blood pressure is better.  We will eventually try to obtain blood pressures in the 130/80 mmHg or less range.  Today's pressure is a significant improvement.  Bmet today to evaluate kidney function and potassium on Aldactone.  Further medication adjustment as needed based upon future blood pressure recordings. 3. LDL of 90 was the reason statin therapy intensity was increased. 4. No evidence of volume overload 5. No discussion other than exercise and decrease carbohydrates. 6. COVID-19 vaccine has been given and is encouraged.  Social distancing is being practiced.   46-monthfollow-up.  Bmet at that time.  We will probably do a be met again in 3 months depending upon  today's results.   Medication Adjustments/Labs and Tests Ordered: Current medicines are reviewed at length with the patient today.  Concerns regarding medicines are outlined above.  Orders Placed This Encounter  Procedures  . Basic metabolic panel   No orders of the defined types were placed in this encounter.   Patient Instructions  Medication Instructions:  Your physician recommends that you continue on your current medications as directed. Please refer to the Current Medication list given to you today.  *If you need a refill on your cardiac medications before your next appointment, please call your pharmacy*   Lab Work: BMET today  If you have labs (blood work) drawn today and your tests are completely normal, you will receive your results only by: .Marland KitchenMyChart Message (if you have MyChart) OR . A paper copy in the mail If you have any lab test that is abnormal or we need to change your treatment, we will call you to review the results.   Testing/Procedures: None   Follow-Up: [facility] you and your health needs are our priority.  As part of our continuing mission to provide you with exceptional heart care, we have created designated Provider Care Teams.  These Care Teams include your primary Cardiologist (physician) and Advanced Practice Providers (APPs -  Physician Assistants and Nurse Practitioners) who all work together to provide you with the care you need, when you need it.  We recommend signing up for the patient portal called "MyChart".  Sign up information is provided on this After Visit Summary.  MyChart is used to connect with patients for Virtual Visits (Telemedicine).  Patients are able to view lab/test results, encounter notes, upcoming appointments, etc.  Non-urgent messages can be sent to your provider as well.   To learn more about what you can do with MyChart, go to [email or web address]    Your next appointment:   6 month(s)  The format for your  next appointment:   In Person  Provider:   You may see HSinclair Grooms MD or one of the following  Advanced Practice Providers on your designated Care Team:    Truitt Merle, NP  Cecilie Kicks, NP  Kathyrn Drown, NP    Other Instructions      Signed, Sinclair Grooms, MD  12/06/2019 12:53 PM    [place]

## 2019-12-06 ENCOUNTER — Encounter: Payer: Self-pay | Admitting: Interventional Cardiology

## 2019-12-06 ENCOUNTER — Ambulatory Visit (INDEPENDENT_AMBULATORY_CARE_PROVIDER_SITE_OTHER): Payer: Medicare HMO | Admitting: Interventional Cardiology

## 2019-12-06 ENCOUNTER — Other Ambulatory Visit: Payer: Self-pay

## 2019-12-06 VITALS — BP 142/88 | HR 60 | Ht 62.4 in | Wt 231.6 lb

## 2019-12-06 DIAGNOSIS — I5032 Chronic diastolic (congestive) heart failure: Secondary | ICD-10-CM

## 2019-12-06 DIAGNOSIS — Z7189 Other specified counseling: Secondary | ICD-10-CM

## 2019-12-06 DIAGNOSIS — I10 Essential (primary) hypertension: Secondary | ICD-10-CM | POA: Diagnosis not present

## 2019-12-06 DIAGNOSIS — I25118 Atherosclerotic heart disease of native coronary artery with other forms of angina pectoris: Secondary | ICD-10-CM | POA: Diagnosis not present

## 2019-12-06 DIAGNOSIS — E785 Hyperlipidemia, unspecified: Secondary | ICD-10-CM | POA: Diagnosis not present

## 2019-12-06 DIAGNOSIS — R7309 Other abnormal glucose: Secondary | ICD-10-CM | POA: Diagnosis not present

## 2019-12-06 LAB — BASIC METABOLIC PANEL
BUN/Creatinine Ratio: 15 (ref 12–28)
BUN: 12 mg/dL (ref 8–27)
CO2: 24 mmol/L (ref 20–29)
Calcium: 10.6 mg/dL — ABNORMAL HIGH (ref 8.7–10.3)
Chloride: 98 mmol/L (ref 96–106)
Creatinine, Ser: 0.79 mg/dL (ref 0.57–1.00)
GFR calc Af Amer: 87 mL/min/{1.73_m2} (ref >59)
GFR calc non Af Amer: 76 mL/min/{1.73_m2} (ref >59)
Glucose: 86 mg/dL (ref 65–99)
Potassium: 4 mmol/L (ref 3.5–5.2)
Sodium: 137 mmol/L (ref 134–144)

## 2019-12-06 NOTE — Patient Instructions (Signed)
Medication Instructions:  Your physician recommends that you continue on your current medications as directed. Please refer to the Current Medication list given to you today.  *If you need a refill on your cardiac medications before your next appointment, please call your pharmacy*   Lab Work: BMET today  If you have labs (blood work) drawn today and your tests are completely normal, you will receive your results only by: . MyChart Message (if you have MyChart) OR . A paper copy in the mail If you have any lab test that is abnormal or we need to change your treatment, we will call you to review the results.   Testing/Procedures: None   Follow-Up: At [facility], you and your health needs are our priority.  As part of our continuing mission to provide you with exceptional heart care, we have created designated Provider Care Teams.  These Care Teams include your primary Cardiologist (physician) and Advanced Practice Providers (APPs -  Physician Assistants and Nurse Practitioners) who all work together to provide you with the care you need, when you need it.  We recommend signing up for the patient portal called "MyChart".  Sign up information is provided on this After Visit Summary.  MyChart is used to connect with patients for Virtual Visits (Telemedicine).  Patients are able to view lab/test results, encounter notes, upcoming appointments, etc.  Non-urgent messages can be sent to your provider as well.   To learn more about what you can do with MyChart, go to [email or web address].    Your next appointment:   6 month(s)  The format for your next appointment:   In Person  Provider:   You may see Henry W Smith III, MD or one of the following Advanced Practice Providers on your designated Care Team:    Lori Gerhardt, NP  Laura Ingold, NP  Jill McDaniel, NP    Other Instructions   

## 2020-01-01 ENCOUNTER — Ambulatory Visit (INDEPENDENT_AMBULATORY_CARE_PROVIDER_SITE_OTHER): Payer: Medicare HMO | Admitting: Pharmacist

## 2020-01-01 ENCOUNTER — Other Ambulatory Visit: Payer: Self-pay | Admitting: Nurse Practitioner

## 2020-01-01 DIAGNOSIS — E785 Hyperlipidemia, unspecified: Secondary | ICD-10-CM | POA: Diagnosis not present

## 2020-01-01 DIAGNOSIS — I25118 Atherosclerotic heart disease of native coronary artery with other forms of angina pectoris: Secondary | ICD-10-CM

## 2020-01-01 DIAGNOSIS — I5032 Chronic diastolic (congestive) heart failure: Secondary | ICD-10-CM | POA: Diagnosis not present

## 2020-01-01 DIAGNOSIS — I10 Essential (primary) hypertension: Secondary | ICD-10-CM

## 2020-01-01 MED ORDER — METOPROLOL SUCCINATE ER 100 MG PO TB24: 100.0000 mg | ORAL_TABLET | Freq: Every day | ORAL | 3 refills | Status: DC

## 2020-01-01 NOTE — Patient Instructions (Addendum)
Ms. Ketterman,   It was great talking with you today! Dr. Tamala Julian agrees with changing to the once daily metoprolol succinate 100 mg - take this every morning  Please call the Customer Service number on the back of your [facility] card to ask about Over The Counter benefits, and how to order an arm blood pressure machine from them.   Here is how you should fill your pill box:   Morning: - Aspirin 81 mg - Clopidogrel 75 mg  - Rosuvastatin 40 mg  - Hydrochlorothiazide 25 mg  - Losartan 100 mg - Amlodipine 10 mg  - Spironolactone 12.5 mg (1/2 tablet) - Isosorbide 60 mg  - Metoprolol succinate 100 mg  - Potassium 20 mEq    Please call me with any questions!   Visit Information  Goals Addressed            This Visit's Progress     Patient Stated   . PharmD "I want to understand my medications" (pt-stated)       [place] (see longtitudinal plan of care for additional care plan information)  Current Barriers:  . Polypharmacy; complex patient with multiple comorbidities including prediabetes, CAD (hx MI, hx stent restenosis), HLD, HFpEF, obesity o Denies any cost concerns, patient has both Medicare and Medicaid  . Has recently started using a pill box to manage medications, but is often forgetting the evening dose of metoprolol . Most recent eGFR: ~87 mL/min o HFpEF/HTN: Amlodipine 10 mg, HCTZ 25 mg, losartan 100 mg, imdur 60 mg, metoprolol tartrate 50 mg daily- often forgetting evening dose, spironolactone 12.5 mg daily, potassium 20 mEq daily; reports she does not have a home BP cuff o ASCVD risk reduction: Rosuvastatin 40 mg daily (last LDL 116 prior to switch); ASA 81 mg daily, clopidogrel 75 mg daily   Pharmacist Clinical Goal(s):  Marland Kitchen Over the next 90 days, patient will work with PharmD and provider towards optimized medication management  Interventions: . Comprehensive medication review performed; medication list updated in electronic medical record . Inter-disciplinary  care team collaboration (see longitudinal plan of care) . Patient often forgetting evening metoprolol tartrate dose. All other medications CAN be taken in the morning. In this patient w/ hx nonadherence, recommend switching to metoprolol succinate 100 mg QAM. Will collaborate w/ PCP and Cardiology on this recommendation . Reviewed that given long t1/2, patient can take rosuvastatin QAM.  . Providing list of how to fill her pill box. . Educated on OTC benefits from [place]. Patient encouraged to call Customer Service to discuss this benefit and see if she can purchase a BP machine with this benefit. Recommend arm cuff over wrist cuff.   Patient Self Care Activities:  . Patient will take medications as prescribed . Patient will fill pill box weekly  Initial goal documentation        The patient verbalized understanding of instructions provided today and agreed to receive a mailed copy of patient instruction and/or educational materials.  Plan: - Will plan to meet with patient face to face at next PCP appointment  Catie Darnelle Maffucci, PharmD, [place] [phone]

## 2020-01-01 NOTE — Progress Notes (Signed)
Yes!  Great idea.

## 2020-01-01 NOTE — Chronic Care Management (AMB) (Signed)
Chronic Care Management   Note  01/01/2020 Name: Jennifer Grant MRN: [id number] DOB: 04-21-1951   Subjective:  Jennifer Grant is a 69 y.o. year old female who is a primary care patient of Cannady, Barbaraann Faster, NP. The CCM team was consulted for assistance with chronic disease management and care coordination needs.    Contacted patient for medication management review.   Review of patient status, including review of consultants reports, laboratory and other test data, was performed as part of comprehensive evaluation and provision of chronic care management services.   SDOH (Social Determinants of Health) assessments and interventions performed: yes   Objective:  Lab Results  Component Value Date   CREATININE 0.79 12/06/2019   CREATININE 0.82 12/04/2019   CREATININE 0.68 09/04/2019    Lab Results  Component Value Date   HGBA1C 6.1 (H) 12/04/2019       Component Value Date/Time   CHOL 173 12/04/2019 1451   CHOL 206 (H) 12/28/2017 1356   TRIG 94 12/04/2019 1451   TRIG 136 12/28/2017 1356   HDL 40 12/04/2019 1451   CHOLHDL 4.5 (H) 08/09/2018 1521   CHOLHDL 3.8 07/16/2015 0733   VLDL 27 12/28/2017 1356   LDLCALC 116 (H) 12/04/2019 1451    Clinical ASCVD: Yes  The ASCVD Risk score Mikey Bussing DC Jr., et al., 2013) failed to calculate for the following reasons:   The patient has a prior MI or stroke diagnosis    BP Readings from Last 3 Encounters:  12/06/19 (!) 142/88  11/22/19 (!) 152/98  09/04/19 (!) 148/86    Allergies  Allergen Reactions  . Brilinta [Ticagrelor] Shortness Of Breath  . Lipitor [Atorvastatin] Itching    Mouth itching, cough  . Latex Other (See Comments)    Gloves make hands look black after prolonged use  . Penicillins Itching and Swelling    Tongue itching and lip swelling Has patient had a PCN reaction causing immediate rash, facial/tongue/throat swelling, SOB or lightheadedness with hypotension: yes Has patient had a PCN reaction causing severe  rash involving mucus membranes or skin necrosis: No Has patient had a PCN reaction that required hospitalization No Has patient had a PCN reaction occurring within the last 10 years: No If all of the above answers are "NO", then may proceed with Cephalosporin use.  Marisa Severin [Aliskiren] Rash    Medications Reviewed Today    Reviewed by De Hollingshead, [facility] (Pharmacist) on 01/01/20 at 1241  Med List Status: <None>  Medication Order Taking? Sig Documenting Provider Last Dose Status Informant  amLODipine (NORVASC) 10 MG tablet [id number] Yes Take 1 tablet (10 mg total) by mouth daily. Marnee Guarneri T, NP Taking Active   aspirin EC 81 MG tablet [id number] Yes Take 81 mg by mouth daily. [provider] Taking Active   cholecalciferol (VITAMIN D3) 25 MCG (1000 UNIT) tablet [id number] Yes Take 1,000 Units by mouth daily. [provider] Taking Active   clopidogrel (PLAVIX) 75 MG tablet [id number] Yes Take 1 tablet (75 mg total) by mouth daily. Marnee Guarneri T, NP Taking Active   hydrochlorothiazide (HYDRODIURIL) 25 MG tablet [id number] Yes Take 1 tablet (25 mg total) by mouth daily. Venita Lick, NP Taking Active   Infant Care Products [facility]) CREA [id number]  Apply 1 application topically daily. Guadalupe Maple, MD  Active   isosorbide mononitrate (IMDUR) 60 MG 24 hr tablet [id number] Yes Take 1 tablet by mouth once daily Cannady, Jolene T, NP Taking Active   losartan (COZAAR)  100 MG tablet [id number] Yes Take 1 tablet (100 mg total) by mouth daily. Marnee Guarneri T, NP Taking Active   metoprolol tartrate (LOPRESSOR) 50 MG tablet [id number] Yes Take 50 mg by mouth 2 (two) times daily.  [provider] Taking Active   nitroGLYCERIN (NITROSTAT) 0.4 MG SL tablet [id number]  DISSOLVE ONE TABLET UNDER THE TONGUE EVERY 5 MINUTES AS NEEDED FOR CHEST PAIN.  DO NOT EXCEED A TOTAL OF 3 DOSES IN 15 MINUTES Cannady, Jolene T, NP  Active   potassium chloride SA (KLOR-CON) 20  MEQ tablet [id number] Yes Take 1 tablet (20 mEq total) by mouth daily. Marnee Guarneri T, NP Taking Active   rosuvastatin (CRESTOR) 40 MG tablet [id number] Yes Take 1 tablet (40 mg total) by mouth daily. Marnee Guarneri T, NP Taking Active   spironolactone (ALDACTONE) 25 MG tablet [id number] Yes Take 0.5 tablets (12.5 mg total) by mouth daily. Belva Crome, MD Taking Active            Assessment:   Goals Addressed            This Visit's Progress     Patient Stated   . PharmD "I want to understand my medications" (pt-stated)       [place] (see longtitudinal plan of care for additional care plan information)  Current Barriers:  . Polypharmacy; complex patient with multiple comorbidities including prediabetes, CAD (hx MI, hx stent restenosis), HLD, HFpEF, obesity o Denies any cost concerns, patient has both Medicare and Medicaid  . Has recently started using a pill box to manage medications, but is often forgetting the evening dose of metoprolol . Most recent eGFR: ~87 mL/min o HFpEF/HTN: Amlodipine 10 mg, HCTZ 25 mg, losartan 100 mg, imdur 60 mg, metoprolol tartrate 50 mg daily- often forgetting evening dose, spironolactone 12.5 mg daily, potassium 20 mEq daily; reports she does not have a home BP cuff o ASCVD risk reduction: Rosuvastatin 40 mg daily (last LDL 116 prior to switch); ASA 81 mg daily, clopidogrel 75 mg daily   Pharmacist Clinical Goal(s):  Marland Kitchen Over the next 90 days, patient will work with PharmD and provider towards optimized medication management  Interventions: . Comprehensive medication review performed; medication list updated in electronic medical record . Inter-disciplinary care team collaboration (see longitudinal plan of care) . Patient often forgetting evening metoprolol tartrate dose. All other medications CAN be taken in the morning. In this patient w/ hx nonadherence, recommend switching to metoprolol succinate 100 mg QAM. Will collaborate w/ PCP and  Cardiology on this recommendation . Reviewed that given long t1/2, patient can take rosuvastatin QAM.  . Providing list of how to fill her pill box. . Educated on OTC benefits from [place]. Patient encouraged to call Customer Service to discuss this benefit and see if she can purchase a BP machine with this benefit. Recommend arm cuff over wrist cuff.   Patient Self Care Activities:  . Patient will take medications as prescribed . Patient will fill pill box weekly  Initial goal documentation        Plan: - Will plan to meet with patient face to face at next PCP appointment  Catie Darnelle Maffucci, PharmD, [place] [phone]

## 2020-01-11 ENCOUNTER — Encounter: Payer: Self-pay | Admitting: Nurse Practitioner

## 2020-01-15 ENCOUNTER — Encounter: Payer: Self-pay | Admitting: Nurse Practitioner

## 2020-01-15 ENCOUNTER — Other Ambulatory Visit: Payer: Medicare HMO

## 2020-01-15 ENCOUNTER — Other Ambulatory Visit: Payer: Self-pay

## 2020-01-15 ENCOUNTER — Ambulatory Visit: Payer: Medicare HMO | Admitting: Nurse Practitioner

## 2020-01-15 DIAGNOSIS — E782 Mixed hyperlipidemia: Secondary | ICD-10-CM

## 2020-01-15 NOTE — Progress Notes (Signed)
Error, rescheduled for lab visit only

## 2020-01-16 LAB — LIPID PANEL W/O CHOL/HDL RATIO
Cholesterol, Total: 141 mg/dL (ref 100–199)
HDL: 36 mg/dL — ABNORMAL LOW (ref >39)
LDL Chol Calc (NIH): 89 mg/dL (ref 0–99)
Triglycerides: 85 mg/dL (ref 0–149)
VLDL Cholesterol Cal: 16 mg/dL (ref 5–40)

## 2020-01-16 NOTE — Progress Notes (Signed)
Good morning, please let Jennifer Grant know her cholesterol levels are looking much better with the change to Rosuvastatin.  I would like her to continue to take this daily, much improved.  Her LDL (bad cholesterol) went from 116 to 89 and total cholesterol from 173 to 141.  Great job!!

## 2020-04-28 ENCOUNTER — Ambulatory Visit (INDEPENDENT_AMBULATORY_CARE_PROVIDER_SITE_OTHER): Payer: Medicare HMO

## 2020-04-28 VITALS — Ht 63.0 in | Wt 229.0 lb

## 2020-04-28 DIAGNOSIS — Z0000 Encounter for general adult medical examination without abnormal findings: Secondary | ICD-10-CM | POA: Diagnosis not present

## 2020-04-28 NOTE — Patient Instructions (Signed)
[name] , Thank you for taking time to come for your Medicare Wellness Visit. I appreciate your ongoing commitment to your health goals. Please review the following plan we discussed and let me know if I can assist you in the future.   Screening recommendations/referrals: Colonoscopy: due Mammogram: patient to schedule Bone Density: completed 11/26/2010 Recommended yearly ophthalmology/optometry visit for glaucoma screening and checkup Recommended yearly dental visit for hygiene and checkup  Vaccinations: Influenza vaccine: decline Pneumococcal vaccine: completed 03/24/2016 Tdap vaccine: completed 03/24/2016 Shingles vaccine: discussed   Covid-19: decline  Advanced directives: Advance directive discussed with you today.   Conditions/risks identified: none  Next appointment: Follow up in one year for your annual wellness visit    Preventive Care 65 Years and Older, Female Preventive care refers to lifestyle choices and visits with your health care provider that can promote health and wellness. What does preventive care include?  A yearly physical exam. This is also called an annual well check.  Dental exams once or twice a year.  Routine eye exams. Ask your health care provider how often you should have your eyes checked.  Personal lifestyle choices, including:  Daily care of your teeth and gums.  Regular physical activity.  Eating a healthy diet.  Avoiding tobacco and drug use.  Limiting alcohol use.  Practicing safe sex.  Taking low-dose aspirin every day.  Taking vitamin and mineral supplements as recommended by your health care provider. What happens during an annual well check? The services and screenings done by your health care provider during your annual well check will depend on your age, overall health, lifestyle risk factors, and family history of disease. Counseling  Your health care provider may ask you questions about your:  Alcohol  use.  Tobacco use.  Drug use.  Emotional well-being.  Home and relationship well-being.  Sexual activity.  Eating habits.  History of falls.  Memory and ability to understand (cognition).  Work and work Astronomer.  Reproductive health. Screening  You may have the following tests or measurements:  Height, weight, and BMI.  Blood pressure.  Lipid and cholesterol levels. These may be checked every 5 years, or more frequently if you are over 83 years old.  Skin check.  Lung cancer screening. You may have this screening every year starting at age 5 if you have a 30-pack-year history of smoking and currently smoke or have quit within the past 15 years.  Fecal occult blood test (FOBT) of the stool. You may have this test every year starting at age 74.  Flexible sigmoidoscopy or colonoscopy. You may have a sigmoidoscopy every 5 years or a colonoscopy every 10 years starting at age 9.  Hepatitis C blood test.  Hepatitis B blood test.  Sexually transmitted disease (STD) testing.  Diabetes screening. This is done by checking your blood sugar (glucose) after you have not eaten for a while (fasting). You may have this done every 1-3 years.  Bone density scan. This is done to screen for osteoporosis. You may have this done starting at age 44.  Mammogram. This may be done every 1-2 years. Talk to your health care provider about how often you should have regular mammograms. Talk with your health care provider about your test results, treatment options, and if necessary, the need for more tests. Vaccines  Your health care provider may recommend certain vaccines, such as:  Influenza vaccine. This is recommended every year.  Tetanus, diphtheria, and acellular pertussis (Tdap, Td) vaccine. You may need  a Td booster every 10 years.  Zoster vaccine. You may need this after age 79.  Pneumococcal 13-valent conjugate (PCV13) vaccine. One dose is recommended after age  37.  Pneumococcal polysaccharide (PPSV23) vaccine. One dose is recommended after age 77. Talk to your health care provider about which screenings and vaccines you need and how often you need them. This information is not intended to replace advice given to you by your health care provider. Make sure you discuss any questions you have with your health care provider. Document Released: 09/12/2015 Document Revised: 05/05/2016 Document Reviewed: 06/17/2015 Elsevier Interactive Patient Education  2017 [place] Prevention in the Home Falls can cause injuries. They can happen to people of all ages. There are many things you can do to make your home safe and to help prevent falls. What can I do on the outside of my home?  Regularly fix the edges of walkways and driveways and fix any cracks.  Remove anything that might make you trip as you walk through a door, such as a raised step or threshold.  Trim any bushes or trees on the path to your home.  Use bright outdoor lighting.  Clear any walking paths of anything that might make someone trip, such as rocks or tools.  Regularly check to see if handrails are loose or broken. Make sure that both sides of any steps have handrails.  Any raised decks and porches should have guardrails on the edges.  Have any leaves, snow, or ice cleared regularly.  Use sand or salt on walking paths during winter.  Clean up any spills in your garage right away. This includes oil or grease spills. What can I do in the bathroom?  Use night lights.  Install grab bars by the toilet and in the tub and shower. Do not use towel bars as grab bars.  Use non-skid mats or decals in the tub or shower.  If you need to sit down in the shower, use a plastic, non-slip stool.  Keep the floor dry. Clean up any water that spills on the floor as soon as it happens.  Remove soap buildup in the tub or shower regularly.  Attach bath mats securely with double-sided  non-slip rug tape.  Do not have throw rugs and other things on the floor that can make you trip. What can I do in the bedroom?  Use night lights.  Make sure that you have a light by your bed that is easy to reach.  Do not use any sheets or blankets that are too big for your bed. They should not hang down onto the floor.  Have a firm chair that has side arms. You can use this for support while you get dressed.  Do not have throw rugs and other things on the floor that can make you trip. What can I do in the kitchen?  Clean up any spills right away.  Avoid walking on wet floors.  Keep items that you use a lot in easy-to-reach places.  If you need to reach something above you, use a strong step stool that has a grab bar.  Keep electrical cords out of the way.  Do not use floor polish or wax that makes floors slippery. If you must use wax, use non-skid floor wax.  Do not have throw rugs and other things on the floor that can make you trip. What can I do with my stairs?  Do not leave any items on the  stairs.  Make sure that there are handrails on both sides of the stairs and use them. Fix handrails that are broken or loose. Make sure that handrails are as long as the stairways.  Check any carpeting to make sure that it is firmly attached to the stairs. Fix any carpet that is loose or worn.  Avoid having throw rugs at the top or bottom of the stairs. If you do have throw rugs, attach them to the floor with carpet tape.  Make sure that you have a light switch at the top of the stairs and the bottom of the stairs. If you do not have them, ask someone to add them for you. What else can I do to help prevent falls?  Wear shoes that:  Do not have high heels.  Have rubber bottoms.  Are comfortable and fit you well.  Are closed at the toe. Do not wear sandals.  If you use a stepladder:  Make sure that it is fully opened. Do not climb a closed stepladder.  Make sure that both  sides of the stepladder are locked into place.  Ask someone to hold it for you, if possible.  Clearly mark and make sure that you can see:  Any grab bars or handrails.  First and last steps.  Where the edge of each step is.  Use tools that help you move around (mobility aids) if they are needed. These include:  Canes.  Walkers.  Scooters.  Crutches.  Turn on the lights when you go into a dark area. Replace any light bulbs as soon as they burn out.  Set up your furniture so you have a clear path. Avoid moving your furniture around.  If any of your floors are uneven, fix them.  If there are any pets around you, be aware of where they are.  Review your medicines with your doctor. Some medicines can make you feel dizzy. This can increase your chance of falling. Ask your doctor what other things that you can do to help prevent falls. This information is not intended to replace advice given to you by your health care provider. Make sure you discuss any questions you have with your health care provider. Document Released: 06/12/2009 Document Revised: 01/22/2016 Document Reviewed: 09/20/2014 Elsevier Interactive Patient Education  2017 [employer].

## 2020-04-28 NOTE — Progress Notes (Signed)
I connected with Jennifer Grant today by telephone and verified that I am speaking with the correct person using two identifiers. Location patient: home Location provider: work Persons participating in the virtual visit: Creasie Lacosse, Glenna Durand LPN.   I discussed the limitations, risks, security and privacy concerns of performing an evaluation and management service by telephone and the availability of in person appointments. I also discussed with the patient that there may be a patient responsible charge related to this service. The patient expressed understanding and verbally consented to this telephonic visit.    Interactive audio and video telecommunications were attempted between this provider and patient, however failed, due to patient having technical difficulties OR patient did not have access to video capability.  We continued and completed visit with audio only.    Vital signs may be patient reported or missing.   Subjective:   Jennifer Grant is a 69 y.o. female who presents for Medicare Annual (Subsequent) preventive examination.  Review of Systems     Cardiac Risk Factors include: advanced age (>52mn, >>12women);dyslipidemia;hypertension;obesity (BMI >30kg/m2);sedentary lifestyle     Objective:    Today's Vitals   04/28/20 1515  Weight: 229 lb (103.9 kg)  Height: 5' 3" (1.6 m)   Body mass index is 40.57 kg/m.  Advanced Directives 04/28/2020 04/19/2019 04/14/2018 03/11/2017 07/16/2015  Does Patient Have a Medical Advance Directive? _0   Would patient like information on creating a medical advance directive? - - No - Patient declined Yes (MAU/Ambulatory/Procedural Areas - Information given) No - patient declined information    Current Medications (verified) Outpatient Encounter Medications as of 04/28/2020  Medication Sig  . amLODipine (NORVASC) 10 MG tablet Take 1 tablet (10 mg total) by mouth daily.  .Marland Kitchenaspirin EC 81 MG tablet Take 81 mg by mouth  daily.  . cholecalciferol (VITAMIN D3) 25 MCG (1000 UNIT) tablet Take 1,000 Units by mouth daily.  . clopidogrel (PLAVIX) 75 MG tablet Take 1 tablet (75 mg total) by mouth daily.  . hydrochlorothiazide (HYDRODIURIL) 25 MG tablet Take 1 tablet (25 mg total) by mouth daily.  . isosorbide mononitrate (IMDUR) 60 MG 24 hr tablet Take 1 tablet by mouth once daily  . losartan (COZAAR) 100 MG tablet Take 1 tablet (100 mg total) by mouth daily.  . metoprolol succinate (TOPROL-XL) 100 MG 24 hr tablet Take 1 tablet (100 mg total) by mouth daily. Take with or immediately following a meal.  . nitroGLYCERIN (NITROSTAT) 0.4 MG SL tablet DISSOLVE ONE TABLET UNDER THE TONGUE EVERY 5 MINUTES AS NEEDED FOR CHEST PAIN.  DO NOT EXCEED A TOTAL OF 3 DOSES IN 15 MINUTES  . potassium chloride SA (KLOR-CON) 20 MEQ tablet Take 1 tablet (20 mEq total) by mouth daily.  . rosuvastatin (CRESTOR) 40 MG tablet Take 1 tablet (40 mg total) by mouth daily.  .Marland Kitchenspironolactone (ALDACTONE) 25 MG tablet Take 0.5 tablets (12.5 mg total) by mouth daily.  .Marland KitchenUNABLE TO FIND Neo-Cell  . Infant Care Products (DERMACLOUD) CREA Apply 1 application topically daily. (Patient not taking: Reported on 04/28/2020)   No facility-administered encounter medications on file as of 04/28/2020.    Allergies (verified) Brilinta [ticagrelor], Lipitor [atorvastatin], Latex, Penicillins, and Tekturna [aliskiren]   History: Past Medical History:  Diagnosis Date  . CAD (coronary artery disease)    a. 2004 s/p MI w/ PCI/stenting to the LCX, OM1, OM2, RCA, RPDA; b. 05/2015 MI/PCI: 100 OM1 (2.75x20 & 3.0x8 Promus Premier DES'), PTCA OM2; c.  07/2015 Cath/Staged PCI: LM 60, LAD 75d, LCX 10mISR, OM1 ok, OM2 25 ISR, RCA 50p, 967m, 85d (3.0x28 Promus Premier DES).  . Diastolic dysfunction    a. 05/2015 Echo: EF 50-55%, severe lateral HK. Gr1 DD, mildly dil LA. Mildly reduced RV fxn.  . 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    "got booster shot a few times"  . GERD (gastroesophageal  reflux disease)   . Hyperlipidemia   . Hypertensive heart disease   . Menopausal state   . [place]bid [place]  . Noncompliance    Past Surgical History:  Procedure Laterality Date  . CARDIAC CATHETERIZATION N/A 06/08/2015   Procedure: Left Heart Cath and Coronary Angiography;  Surgeon: HeBelva CromeMD;  [facility];  Service: Cardiovascular;  Laterality: N/A;  . CARDIAC CATHETERIZATION N/A 06/08/2015   Procedure: Coronary Stent Intervention;  Surgeon: HeBelva CromeMD;  [facility];  Service: Cardiovascular;  Laterality: N/A;  . CARDIAC CATHETERIZATION N/A 07/16/2015   Procedure: Coronary Stent Intervention;  Surgeon: HeBelva CromeMD;  [facility];  Service: Cardiovascular;  Laterality: N/A;  . CORONARY ANGIOPLASTY WITH STENT PLACEMENT     "6 stents before 05/2015"  . KNEE ARTHROSCOPY Left 1990's  . TUBAL LIGATION  ~ 1978   Family History  Problem Relation Age of Onset  . Healthy Mother   . Prostate cancer Father   . Kidney disease Father   . Healthy Brother   . Cancer Neg Hx   . Diabetes Neg Hx   . Heart disease Neg Hx    Social History   Socioeconomic History  . Marital status: Widowed    Spouse name: Not on file  . Number of children: Not on file  . Years of education: Not on file  . Highest education level: 11th grade  Occupational History  . Occupation: retired  Tobacco Use  . Smoking status: Never Smoker  . Smokeless tobacco: Never Used  Vaping Use  . Vaping Use: Never used  Substance and Sexual Activity  . Alcohol use: No  . Drug use: No  . Sexual activity: Not Currently    Birth control/protection: Surgical  Other Topics Concern  . Not on file  Social History Narrative  . Not on file   Social Determinants of Health   Financial Resource Strain: Low Risk   . Difficulty of Paying Living Expenses: Not hard at all  Food Insecurity: No Food Insecurity  . Worried About RuCharity fundraisern the Last  Year: Never true  . Ran Out of Food in the Last Year: Never true  Transportation Needs: No Transportation Needs  . Lack of Transportation (Medical): No  . Lack of Transportation (Non-Medical): No  Physical Activity: Inactive  . Days of Exercise per Week: 0 days  . Minutes of Exercise per Session: 0 min  Stress: No Stress Concern Present  . Feeling of Stress : Not at all  Social Connections:   . Frequency of Communication with Friends and Family: Not on file  . Frequency of Social Gatherings with Friends and Family: Not on file  . Attends Religious Services: Not on file  . Active Member of Clubs or Organizations: Not on file  . Attends ClArchivisteetings: Not on file  . Marital Status: Not on file    Tobacco Counseling Counseling given: Not Answered   Clinical Intake:  Pre-visit preparation completed: Yes  Pain : No/denies pain  Nutritional Status: BMI > 30  Obese Nutritional Risks: None Diabetes: No  How often do you need to have someone help you when you read instructions, pamphlets, or other written materials from your doctor or pharmacy?: 1 - Never What is the last grade level you completed in school?: some college  Diabetic? no  Interpreter Needed?: No  Information entered by :: NAllen LPN   Activities of Daily Living In your present state of health, do you have any difficulty performing the following activities: 04/28/2020 09/04/2019  Hearing? N N  Vision? N N  Difficulty concentrating or making decisions? N N  Walking or climbing stairs? N N  Dressing or bathing? N N  Doing errands, shopping? N N  Preparing Food and eating ? N -  Using the Toilet? N -  In the past six months, have you accidently leaked urine? N -  Do you have problems with loss of bowel control? N -  Managing your Medications? N -  Managing your Finances? N -  Housekeeping or managing your Housekeeping? N -  Some recent data might be hidden    Patient Care  Team: Venita Lick, NP as PCP - General (Nurse Practitioner) Belva Crome, MD as PCP - Cardiology (Cardiology)  Indicate any recent Medical Services you may have received from other than [facility] providers in the past year (date may be approximate).     Assessment:   This is a routine wellness examination for Jasman.  Hearing/Vision screen  Hearing Screening   125Hz 250Hz 500Hz 1000Hz 2000Hz 3000Hz 4000Hz 6000Hz 8000Hz  Right ear:           Left ear:           Vision Screening Comments: No regular eye exams,  Dietary issues and exercise activities discussed: Current Exercise Habits: The patient does not participate in regular exercise at present  Goals    .  Increase water intake      Recommend drinking at least 6-8 glasses of water a day    .  Patient Stated      04/28/2020, wants to start walking and eating healthy    .  PharmD "I want to understand my medications" (pt-stated)      [place] (see longtitudinal plan of care for additional care plan information)  Current Barriers:  . Polypharmacy; complex patient with multiple comorbidities including prediabetes, CAD (hx MI, hx stent restenosis), HLD, HFpEF, obesity o Denies any cost concerns, patient has both Medicare and Medicaid  . Has recently started using a pill box to manage medications, but is often forgetting the evening dose of metoprolol . Most recent eGFR: ~87 mL/min o HFpEF/HTN: Amlodipine 10 mg, HCTZ 25 mg, losartan 100 mg, imdur 60 mg, metoprolol tartrate 50 mg daily- often forgetting evening dose, spironolactone 12.5 mg daily, potassium 20 mEq daily; reports she does not have a home BP cuff o ASCVD risk reduction: Rosuvastatin 40 mg daily (last LDL 116 prior to switch); ASA 81 mg daily, clopidogrel 75 mg daily   Pharmacist Clinical Goal(s):  Marland Kitchen Over the next 90 days, patient will work with PharmD and provider towards optimized medication management  Interventions: . Comprehensive medication review  performed; medication list updated in electronic medical record . Inter-disciplinary care team collaboration (see longitudinal plan of care) . Patient often forgetting evening metoprolol tartrate dose. All other medications CAN be taken in the morning. In this patient w/ hx nonadherence, recommend switching to metoprolol succinate 100 mg QAM.  Will collaborate w/ PCP and Cardiology on this recommendation . Reviewed that given long t1/2, patient can take rosuvastatin QAM.  . Providing list of how to fill her pill box. . Educated on OTC benefits from [place]. Patient encouraged to call Customer Service to discuss this benefit and see if she can purchase a BP machine with this benefit. Recommend arm cuff over wrist cuff.   Patient Self Care Activities:  . Patient will take medications as prescribed . Patient will fill pill box weekly  Initial goal documentation       Depression Screen PHQ 2/9 Scores 04/28/2020 09/04/2019 04/19/2019 08/09/2018 04/14/2018 12/28/2017 12/28/2017  PHQ - 2 Score 0 0 0 0 0 0 0  PHQ- 9 Score - 0 - 0 - - -    Fall Risk Fall Risk  04/28/2020 09/04/2019 04/19/2019 08/09/2018 04/14/2018  Falls in the past year? 0 0 0 0 No  Number falls in past yr: - 0 - 0 -  Injury with Fall? - 0 - 0 -  Risk for fall due to : Medication side effect - - - -  Follow up Falls evaluation completed;Education provided;Falls prevention discussed - - - -    Any stairs in or around the home? Yes  If so, are there any without handrails? No  Home free of loose throw rugs in walkways, pet beds, electrical cords, etc? Yes  Adequate lighting in your home to reduce risk of falls? Yes   ASSISTIVE DEVICES UTILIZED TO PREVENT FALLS:  Life alert? No  Use of a cane, walker or w/c? No  Grab bars in the bathroom? No  Shower chair or bench in shower? No  Elevated toilet seat or a handicapped toilet? No   TIMED UP AND GO:  Was the test performed? No .     Cognitive Function:     6CIT Screen 04/28/2020  04/14/2018 03/11/2017  What Year? 0 points 0 points 0 points  What month? 0 points 0 points 0 points  What time? 0 points 0 points 0 points  Count back from 20 2 points 0 points 0 points  Months in reverse 0 points 0 points 0 points  Repeat phrase 8 points 0 points 0 points  Total Score 10 0 0    Immunizations Immunization History  Administered Date(s) Administered  . Influenza, High Dose Seasonal PF 08/09/2018  . Influenza,inj,Quad PF,6+ Mos 06/18/2015  . Influenza-Unspecified 08/12/2014  . Pneumococcal Conjugate-13 08/12/2014  . Pneumococcal Polysaccharide-23 03/04/2010, 03/24/2016  . Td 08/30/2004  . Tdap 03/24/2016    TDAP status: Up to date Flu Vaccine status: Declined, Education has been provided regarding the importance of this vaccine but patient still declined. Advised may receive this vaccine at local pharmacy or Health Dept. Aware to provide a copy of the vaccination record if obtained from local pharmacy or Health Dept. Verbalized acceptance and understanding. Pneumococcal vaccine status: Up to date Covid-19 vaccine status: Declined, Education has been provided regarding the importance of this vaccine but patient still declined. Advised may receive this vaccine at local pharmacy or Health Dept.or vaccine clinic. Aware to provide a copy of the vaccination record if obtained from local pharmacy or Health Dept. Verbalized acceptance and understanding.  Qualifies for Shingles Vaccine? Yes   Zostavax completed Yes   Shingrix Completed?: No.    Education has been provided regarding the importance of this vaccine. Patient has been advised to call insurance company to determine out of pocket expense if they have not yet received this  vaccine. Advised may also receive vaccine at local pharmacy or Health Dept. Verbalized acceptance and understanding.  Screening Tests Health Maintenance  Topic Date Due  . MAMMOGRAM  01/12/2019  . COLONOSCOPY  07/19/2019  . COVID-19 Vaccine (1)  05/14/2020 (Originally 10/30/1962)  . INFLUENZA VACCINE  11/27/2020 (Originally 03/30/2020)  . TETANUS/TDAP  03/24/2026  . DEXA SCAN  Completed  . Hepatitis C Screening  Completed  . PNA vac Low Risk Adult  Completed    Health Maintenance  Health Maintenance Due  Topic Date Due  . MAMMOGRAM  01/12/2019  . COLONOSCOPY  07/19/2019    Colorectal cancer screening: Completed 07/18/2014. Repeat every 5 years Mammogram status: patient to schedule Bone Density status: Completed 11/26/2010.   Lung Cancer Screening: (Low Dose CT Chest recommended if Age 49-80 years, 30 pack-year currently smoking OR have quit w/in 15years.) does not qualify.   Lung Cancer Screening Referral: no  Additional Screening:  Hepatitis C Screening: does qualify; Completed 11/04/2015  Vision Screening: Recommended annual ophthalmology exams for early detection of glaucoma and other disorders of the eye. Is the patient up to date with their annual eye exam?  No  Who is the provider or what is the name of the office in which the patient attends annual eye exams? none If pt is not established with a provider, would they like to be referred to a provider to establish care? No .   Dental Screening: Recommended annual dental exams for proper oral hygiene  Community Resource Referral / Chronic Care Management: CRR required this visit?  No   CCM required this visit?  No      Plan:     I have personally reviewed and noted the following in the patient's chart:   . Medical and social history . Use of alcohol, tobacco or illicit drugs  . Current medications and supplements . Functional ability and status . Nutritional status . Physical activity . Advanced directives . List of other physicians . Hospitalizations, surgeries, and ER visits in previous 12 months . Vitals . Screenings to include cognitive, depression, and falls . Referrals and appointments  In addition, I have reviewed and discussed with patient  certain preventive protocols, quality metrics, and best practice recommendations. A written personalized care plan for preventive services as well as general preventive health recommendations were provided to patient.     Kellie Simmering, LPN   04/30/5175   Nurse Notes:

## 2020-06-03 ENCOUNTER — Other Ambulatory Visit: Payer: Self-pay | Admitting: Nurse Practitioner

## 2020-06-03 DIAGNOSIS — I2510 Atherosclerotic heart disease of native coronary artery without angina pectoris: Secondary | ICD-10-CM

## 2020-06-03 NOTE — Telephone Encounter (Signed)
Requested Prescriptions  Pending Prescriptions Disp Refills   nitroGLYCERIN (NITROSTAT) 0.4 MG SL tablet [Pharmacy Med Name: Nitroglycerin 0.4 MG Sublingual Tablet Sublingual] 25 tablet 0    Sig: DISSOLVE ONE TABLET UNDER THE TONGUE EVERY 5 MINUTES AS NEEDED FOR CHEST PAIN.  DO NOT EXCEED A TOTAL OF 3 DOSES IN 15 MINUTES     Cardiovascular:  Nitrates Failed - 06/03/2020  5:03 PM      Failed - Last BP in normal range    BP Readings from Last 1 Encounters:  01/15/20 (!) 150/88         Passed - Last Heart Rate in normal range    Pulse Readings from Last 1 Encounters:  01/15/20 60         Passed - Valid encounter within last 12 months    Recent Outpatient Visits          4 months ago Morbid obesity (HCC)   [facility] Holstein, Crestwood T, NP   6 months ago Chronic diastolic heart failure (HCC)   [facility] Cannady, Dorie Rank, NP   9 months ago Annual physical exam   [facility] Edgerton, Corrie Dandy T, NP   1 year ago Essential hypertension   [facility] Crissman, Redge Gainer, MD   1 year ago Need for influenza vaccination   [facility] Steele Sizer, MD      Future Appointments            In 6 days Lyn Records, MD [facility]   In 1 week Madelia, Dorie Rank, NP [employer] [facility]   In 11 months  [employer] [facility]

## 2020-06-05 NOTE — Progress Notes (Signed)
Cardiology Office Note:    Date:  06/09/2020   ID:  Jennifer LimeValerie E Damico, DOB 11/02/1950, MRN [id number]  PCP:  Marjie Skiffannady, Jolene T, NP  Cardiologist:  Lesleigh NoeHenry W Keilan Nichol III, MD   Referring MD: Marjie Skiffannady, Jolene T, NP   Chief Complaint  Patient presents with  . Coronary Artery Disease  . Congestive Heart Failure    History of Present Illness:    Jennifer Grant is a 69 y.o. female with a hx of coronary artery disease and recent acute infarction due to stent thrombosis. She has history of hypertension, hyperlipidemia, mitral valve prolapse, and chronic diastolic HF.  Accompanied by her daughter.  She complains of left flank and subcostal discomfort.  It has been off and on discomfort not aggravated or related to physical activity.  Her daughter points out that she breathes very hard while sleeping, snores, and stops breathing.  The patient complains of episodes of tachycardia and palpitation that can last up to days at a time.  She has a pre-existing history of "atrial fibrillation" according to the patient.  She also has left side clavicular and left lateral chest discomfort that is sharp.  She has had a heart murmur for years.  Past Medical History:  Diagnosis Date  . CAD (coronary artery disease)    a. 2004 s/p MI w/ PCI/stenting to the LCX, OM1, OM2, RCA, RPDA; b. 05/2015 MI/PCI: 100 OM1 (2.75x20 & 3.0x8 Promus Premier DES'), PTCA OM2; c. 07/2015 Cath/Staged PCI: LM 60, LAD 75d, LCX 2863m ISR, OM1 ok, OM2 25 ISR, RCA 50p, 2665m/d, 85d (3.0x28 Promus Premier DES).  . Diastolic dysfunction    a. 05/2015 Echo: EF 50-55%, severe lateral HK. Gr1 DD, mildly dil LA. Mildly reduced RV fxn.  . 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    "got booster shot a few times"  . GERD (gastroesophageal reflux disease)   . Hyperlipidemia   . Hypertensive heart disease   . Menopausal state   . Morbid obesity (HCC)   . Noncompliance     Past Surgical History:  Procedure Laterality Date  . CARDIAC CATHETERIZATION N/A 06/08/2015     Procedure: Left Heart Cath and Coronary Angiography;  Surgeon: Lyn RecordsHenry W Fue Cervenka, MD;  [facility];  Service: Cardiovascular;  Laterality: N/A;  . CARDIAC CATHETERIZATION N/A 06/08/2015   Procedure: Coronary Stent Intervention;  Surgeon: Lyn RecordsHenry W Elwyn Klosinski, MD;  [facility];  Service: Cardiovascular;  Laterality: N/A;  . CARDIAC CATHETERIZATION N/A 07/16/2015   Procedure: Coronary Stent Intervention;  Surgeon: Lyn RecordsHenry W Sydelle Sherfield, MD;  [facility];  Service: Cardiovascular;  Laterality: N/A;  . CORONARY ANGIOPLASTY WITH STENT PLACEMENT     "6 stents before 05/2015"  . KNEE ARTHROSCOPY Left 1990's  . TUBAL LIGATION  ~ 1978    Current Medications: Current Meds  Medication Sig  . amLODipine (NORVASC) 10 MG tablet Take 1 tablet (10 mg total) by mouth daily.  Marland Kitchen. aspirin EC 81 MG tablet Take 81 mg by mouth daily.  . cholecalciferol (VITAMIN D3) 25 MCG (1000 UNIT) tablet Take 1,000 Units by mouth daily.  . clopidogrel (PLAVIX) 75 MG tablet Take 1 tablet (75 mg total) by mouth daily.  . hydrochlorothiazide (HYDRODIURIL) 25 MG tablet Take 1 tablet (25 mg total) by mouth daily.  . isosorbide mononitrate (IMDUR) 60 MG 24 hr tablet Take 1 tablet by mouth once daily  . losartan (COZAAR) 100 MG tablet Take 1 tablet (100 mg total) by mouth daily.  . metoprolol  succinate (TOPROL-XL) 100 MG 24 hr tablet Take 1 tablet (100 mg total) by mouth daily. Take with or immediately following a meal.  . nitroGLYCERIN (NITROSTAT) 0.4 MG SL tablet DISSOLVE ONE TABLET UNDER THE TONGUE EVERY 5 MINUTES AS NEEDED FOR CHEST PAIN.  DO NOT EXCEED A TOTAL OF 3 DOSES IN 15 MINUTES  . potassium chloride SA (KLOR-CON) 20 MEQ tablet Take 1 tablet (20 mEq total) by mouth daily.  . rosuvastatin (CRESTOR) 40 MG tablet Take 1 tablet (40 mg total) by mouth daily.  Marland Kitchen spironolactone (ALDACTONE) 25 MG tablet Take 0.5 tablets (12.5 mg total) by mouth daily.  Marland Kitchen UNABLE TO FIND Neo-Cell     Allergies:    Brilinta [ticagrelor], Lipitor [atorvastatin], Latex, Penicillins, and Tekturna [aliskiren]   Social History   Socioeconomic History  . Marital status: Widowed    Spouse name: Not on file  . Number of children: Not on file  . Years of education: Not on file  . Highest education level: 11th grade  Occupational History  . Occupation: retired  Tobacco Use  . Smoking status: Never Smoker  . Smokeless tobacco: Never Used  Vaping Use  . Vaping Use: Never used  Substance and Sexual Activity  . Alcohol use: No  . Drug use: No  . Sexual activity: Not Currently    Birth control/protection: Surgical  Other Topics Concern  . Not on file  Social History Narrative  . Not on file   Social Determinants of Health   Financial Resource Strain: Low Risk   . Difficulty of Paying Living Expenses: Not hard at all  Food Insecurity: No Food Insecurity  . Worried About Programme researcher, broadcasting/film/video in the Last Year: Never true  . Ran Out of Food in the Last Year: Never true  Transportation Needs: No Transportation Needs  . Lack of Transportation (Medical): No  . Lack of Transportation (Non-Medical): No  Physical Activity: Inactive  . Days of Exercise per Week: 0 days  . Minutes of Exercise per Session: 0 min  Stress: No Stress Concern Present  . Feeling of Stress : Not at all  Social Connections:   . Frequency of Communication with Friends and Family: Not on file  . Frequency of Social Gatherings with Friends and Family: Not on file  . Attends Religious Services: Not on file  . Active Member of Clubs or Organizations: Not on file  . Attends Banker Meetings: Not on file  . Marital Status: Not on file     Family History: The patient's family history includes Healthy in her brother and mother; Kidney disease in her father; Prostate cancer in her father. There is no history of Cancer, Diabetes, or Heart disease.  ROS:   Please see the history of present illness.    Bilateral flank pain.   Somewhat positional.  No nitroglycerin use for chest pain.  All other systems reviewed and are negative.  EKGs/Labs/Other Studies Reviewed:    The following studies were reviewed today: No recent imaging data.  EKG:  EKG 11/23/2019 reveals sinus bradycardia, prominent voltage, nonspecific ST abnormality.  Recent Labs: 09/04/2019: Hemoglobin 12.9; Platelets 209; TSH 3.240 12/04/2019: ALT 15 12/06/2019: BUN 12; Creatinine, Ser 0.79; Potassium 4.0; Sodium 137  Recent Lipid Panel    Component Value Date/Time   CHOL 141 01/15/2020 1357   CHOL 206 (H) 12/28/2017 1356   TRIG 85 01/15/2020 1357   TRIG 136 12/28/2017 1356   HDL 36 (L) 01/15/2020 1357   CHOLHDL  4.5 (H) 08/09/2018 1521   CHOLHDL 3.8 07/16/2015 0733   VLDL 27 12/28/2017 1356   LDLCALC 89 01/15/2020 1357    Physical Exam:    VS:  BP (!) 152/84   Pulse 64   Wt 232 lb (105.2 kg)   LMP  (LMP Unknown)   SpO2 100%   BMI 41.10 kg/m     Wt Readings from Last 3 Encounters:  06/09/20 232 lb (105.2 kg)  04/28/20 229 lb (103.9 kg)  12/06/19 231 lb 9.6 oz (105.1 kg)     GEN: Morbidly obese.  BMI is 41.. No acute distress HEENT: Normal NECK: No JVD. LYMPHATICS: No lymphadenopathy CARDIAC:  RRR without murmur, gallop, or edema. VASCULAR:  Normal Pulses. No bruits. RESPIRATORY:  Clear to auscultation without rales, wheezing or rhonchi  ABDOMEN: Soft, non-tender, non-distended, No pulsatile mass, MUSCULOSKELETAL: No deformity  SKIN: Warm and dry NEUROLOGIC:  Alert and oriented x 3 PSYCHIATRIC:  Normal affect   ASSESSMENT:    1. 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 Essential hypertension   3. Chronic diastolic heart failure (HCC)   4. Hyperlipidemia, unspecified hyperlipidemia type   5. Educated about COVID-19 virus infection   6. Paroxysmal atrial fibrillation (HCC)   7. Systolic murmur   8. Snoring   9. Apnea    PLAN:    In order of problems listed  above:  1. Secondary prevention discussed.  Continue high intensity statin therapy.  Last LDL was 89 in May.  Decrease carbohydrates in diet.  Increase physical activity.  Continue isosorbide to suppress angina. 2. Continue Toprol-XL 100 mg/day, Cozaar 100 mg/day, Imdur 60 mg/day, HydroDIURIL 25 mg/day, amlodipine 10 mg/day, Aldactone 12.5 mg/day.  Low-salt diet less than 2.5 g/day as recommended. 3. No evidence of volume overload.  Continue the above set of medications. 4. Crestor is prescribed.  Blood work in 6 months. 5. She is COVID-19 vaccinated. 6. Prior history of A. fib.  Probably has sleep apnea.  Complains of recurrent palpitations.  30-day monitor to rule out PAF. 7. Consistent with aortic stenosis or sclerosis. 8. Suspect sleep apnea.  May be driving potential atrial fibrillation.  May be causing resistant hypertension.  A sleep study is ordered.   Medication Adjustments/Labs and Tests Ordered: Current medicines are reviewed at length with the patient today.  Concerns regarding medicines are outlined above.  Orders Placed This Encounter  Procedures  . Cardiac event monitor  . ECHOCARDIOGRAM COMPLETE  . Split night study   No orders of the defined types were placed in this encounter.   Patient Instructions  Medication Instructions:  Your physician recommends that you continue on your current medications as directed. Please refer to the Current Medication list given to you today.  *If you need a refill on your cardiac medications before your next appointment, please call your pharmacy*   Lab Work: None If you have labs (blood work) drawn today and your tests are completely normal, you will receive your results only by: Marland Kitchen MyChart Message (if you have MyChart) OR . A paper copy in the mail If you have any lab test that is abnormal or we need to change your treatment, we will call you to review the results.   Testing/Procedures: Your physician has requested that you have  an echocardiogram. Echocardiography is a painless test that uses sound waves to create images of your heart. It provides your doctor with information about the size and  shape of your heart and how well your heart's chambers and valves are working. This procedure takes approximately one hour. There are no restrictions for this procedure.  Your physician has recommended that you wear an event monitor. Event monitors are medical devices that record the heart's electrical activity. Doctors most often [place] these monitors to diagnose arrhythmias. Arrhythmias are problems with the speed or rhythm of the heartbeat. The monitor is a small, portable device. You can wear one while you do your normal daily activities. This is usually used to diagnose what is causing palpitations/syncope (passing out).  Your physician has recommended that you have a sleep study. This test records several body functions during sleep, including: brain activity, eye movement, oxygen and carbon dioxide blood levels, heart rate and rhythm, breathing rate and rhythm, the flow of air through your mouth and nose, snoring, body muscle movements, and chest and belly movement.    Follow-Up: At [facility], you and your health needs are our priority.  As part of our continuing mission to provide you with exceptional heart care, we have created designated Provider Care Teams.  These Care Teams include your primary Cardiologist (physician) and Advanced Practice Providers (APPs -  Physician Assistants and Nurse Practitioners) who all work together to provide you with the care you need, when you need it.  We recommend signing up for the patient portal called "MyChart".  Sign up information is provided on this After Visit Summary.  MyChart is used to connect with patients for Virtual Visits (Telemedicine).  Patients are able to view lab/test results, encounter notes, upcoming appointments, etc.  Non-urgent messages can be sent to your provider as  well.   To learn more about what you can do with MyChart, go to [email or web address].    Your next appointment:   6 month(s)  The format for your next appointment:   In Person  Provider:   You may see Lesleigh Noe, MD or one of the following Advanced Practice Providers on your designated Care Team:    Norma Fredrickson, NP  Nada Boozer, NP  Georgie Chard, NP    Other Instructions      Signed, Lesleigh Noe, MD  06/09/2020 10:22 AM    [facility]

## 2020-06-09 ENCOUNTER — Telehealth: Payer: Self-pay | Admitting: Interventional Cardiology

## 2020-06-09 ENCOUNTER — Ambulatory Visit (INDEPENDENT_AMBULATORY_CARE_PROVIDER_SITE_OTHER): Payer: Medicare HMO | Admitting: Interventional Cardiology

## 2020-06-09 ENCOUNTER — Other Ambulatory Visit: Payer: Self-pay

## 2020-06-09 ENCOUNTER — Encounter: Payer: Self-pay | Admitting: Interventional Cardiology

## 2020-06-09 ENCOUNTER — Encounter: Payer: Self-pay | Admitting: *Deleted

## 2020-06-09 VITALS — BP 152/84 | HR 64 | Ht 62.0 in | Wt 232.0 lb

## 2020-06-09 DIAGNOSIS — I480 Paroxysmal atrial fibrillation: Secondary | ICD-10-CM

## 2020-06-09 DIAGNOSIS — R011 Cardiac murmur, unspecified: Secondary | ICD-10-CM | POA: Diagnosis not present

## 2020-06-09 DIAGNOSIS — I5032 Chronic diastolic (congestive) heart failure: Secondary | ICD-10-CM | POA: Diagnosis not present

## 2020-06-09 DIAGNOSIS — I25118 Atherosclerotic heart disease of native coronary artery with other forms of angina pectoris: Secondary | ICD-10-CM | POA: Diagnosis not present

## 2020-06-09 DIAGNOSIS — I10 Essential (primary) hypertension: Secondary | ICD-10-CM

## 2020-06-09 DIAGNOSIS — R0681 Apnea, not elsewhere classified: Secondary | ICD-10-CM

## 2020-06-09 DIAGNOSIS — R0683 Snoring: Secondary | ICD-10-CM | POA: Diagnosis not present

## 2020-06-09 DIAGNOSIS — Z7189 Other specified counseling: Secondary | ICD-10-CM | POA: Diagnosis not present

## 2020-06-09 DIAGNOSIS — E785 Hyperlipidemia, unspecified: Secondary | ICD-10-CM | POA: Diagnosis not present

## 2020-06-09 NOTE — Patient Instructions (Signed)
Medication Instructions:  Your physician recommends that you continue on your current medications as directed. Please refer to the Current Medication list given to you today.  *If you need a refill on your cardiac medications before your next appointment, please call your pharmacy*   Lab Work: None If you have labs (blood work) drawn today and your tests are completely normal, you will receive your results only by: Marland Kitchen MyChart Message (if you have MyChart) OR . A paper copy in the mail If you have any lab test that is abnormal or we need to change your treatment, we will call you to review the results.   Testing/Procedures: Your physician has requested that you have an echocardiogram. Echocardiography is a painless test that uses sound waves to create images of your heart. It provides your doctor with information about the size and shape of your heart and how well your heart's chambers and valves are working. This procedure takes approximately one hour. There are no restrictions for this procedure.  Your physician has recommended that you wear an event monitor. Event monitors are medical devices that record the heart's electrical activity. Doctors most often [place] these monitors to diagnose arrhythmias. Arrhythmias are problems with the speed or rhythm of the heartbeat. The monitor is a small, portable device. You can wear one while you do your normal daily activities. This is usually used to diagnose what is causing palpitations/syncope (passing out).  Your physician has recommended that you have a sleep study. This test records several body functions during sleep, including: brain activity, eye movement, oxygen and carbon dioxide blood levels, heart rate and rhythm, breathing rate and rhythm, the flow of air through your mouth and nose, snoring, body muscle movements, and chest and belly movement.    Follow-Up: At [facility] you and your health needs are our priority.  As part of our  continuing mission to provide you with exceptional heart care, we have created designated Provider Care Teams.  These Care Teams include your primary Cardiologist (physician) and Advanced Practice Providers (APPs -  Physician Assistants and Nurse Practitioners) who all work together to provide you with the care you need, when you need it.  We recommend signing up for the patient portal called "MyChart".  Sign up information is provided on this After Visit Summary.  MyChart is used to connect with patients for Virtual Visits (Telemedicine).  Patients are able to view lab/test results, encounter notes, upcoming appointments, etc.  Non-urgent messages can be sent to your provider as well.   To learn more about what you can do with MyChart, go to [email or web address].    Your next appointment:   6 month(s)  The format for your next appointment:   In Person  Provider:   You may see Lesleigh Noe, MD or one of the following Advanced Practice Providers on your designated Care Team:    Norma Fredrickson, NP  Nada Boozer, NP  Georgie Chard, NP    Other Instructions

## 2020-06-09 NOTE — Progress Notes (Signed)
Patient ID: Jennifer Grant, female   DOB: August 11, 1951, 69 y.o.   MRN: [id number] Patient enrolled for [employer] to ship a 30 day cardiac event monitor to her home.

## 2020-06-09 NOTE — Telephone Encounter (Signed)
[employer] auth approved. Berkley Harvey [id number] valid 06-09-20 thru 07-09-20.

## 2020-06-10 ENCOUNTER — Ambulatory Visit (INDEPENDENT_AMBULATORY_CARE_PROVIDER_SITE_OTHER): Payer: Medicare HMO | Admitting: Nurse Practitioner

## 2020-06-10 ENCOUNTER — Telehealth: Payer: Self-pay | Admitting: *Deleted

## 2020-06-10 ENCOUNTER — Encounter: Payer: Self-pay | Admitting: Nurse Practitioner

## 2020-06-10 VITALS — BP 138/80 | HR 76 | Temp 97.2°F | Ht 62.0 in | Wt 233.0 lb

## 2020-06-10 DIAGNOSIS — I10 Essential (primary) hypertension: Secondary | ICD-10-CM | POA: Diagnosis not present

## 2020-06-10 DIAGNOSIS — I252 Old myocardial infarction: Secondary | ICD-10-CM

## 2020-06-10 DIAGNOSIS — R7309 Other abnormal glucose: Secondary | ICD-10-CM | POA: Diagnosis not present

## 2020-06-10 DIAGNOSIS — I25118 Atherosclerotic heart disease of native coronary artery with other forms of angina pectoris: Secondary | ICD-10-CM | POA: Diagnosis not present

## 2020-06-10 DIAGNOSIS — N8110 Cystocele, unspecified: Secondary | ICD-10-CM

## 2020-06-10 DIAGNOSIS — E559 Vitamin D deficiency, unspecified: Secondary | ICD-10-CM

## 2020-06-10 DIAGNOSIS — Z6841 Body Mass Index (BMI) 40.0 and over, adult: Secondary | ICD-10-CM | POA: Diagnosis not present

## 2020-06-10 DIAGNOSIS — Z9114 Patient's other noncompliance with medication regimen: Secondary | ICD-10-CM

## 2020-06-10 DIAGNOSIS — I5032 Chronic diastolic (congestive) heart failure: Secondary | ICD-10-CM

## 2020-06-10 DIAGNOSIS — E782 Mixed hyperlipidemia: Secondary | ICD-10-CM | POA: Diagnosis not present

## 2020-06-10 LAB — MICROALBUMIN, URINE WAIVED
Creatinine, Urine Waived: 100 mg/dL (ref 10–300)
Microalb, Ur Waived: 30 mg/L — ABNORMAL HIGH (ref 0–19)
Microalb/Creat Ratio: <30 mg/g (ref <30)

## 2020-06-10 LAB — BAYER DCA HB A1C WAIVED: HB A1C (BAYER DCA - WAIVED): 5.7 % (ref <7.0)

## 2020-06-10 NOTE — Telephone Encounter (Addendum)
Patient is scheduled for lab study on 06/28/20. Patient understands her sleep study will be done at [facility]. Patient understands she will receive a sleep packet in a week or so. Patient understands to call if she does not receive the sleep packet in a timely manner.

## 2020-06-10 NOTE — Assessment & Plan Note (Signed)
Chronic, ongoing.  Continue current medication regimen and collaboration with cardiology.  No recent NTG use, will send in refill.

## 2020-06-10 NOTE — Assessment & Plan Note (Signed)
Chronic, ongoing.  Recommend ensuring medication taken daily + checking BP at home at least a few mornings a week.  Continue collaboration with cardiology and review notes.  Continue current medication regimen at this time, suspect adjustments will need to be made in future.  CCM referral is in place.  Obtain labs today.  Return in 6 months.

## 2020-06-10 NOTE — Assessment & Plan Note (Signed)
Chronic, ongoing.  Continue current medication regimen and collaboration with cardiology.  Discussed diet choices.   - Reminded to call for an overnight weight gain of >2 pounds or a weekly weight weight of >5 pounds - not adding salt to his food and has been reading food labels. Reviewed the importance of keeping daily sodium intake to 2000mg  daily

## 2020-06-10 NOTE — Assessment & Plan Note (Signed)
A1C downward trend today at 5.7%, continue diet focus and regular exercise.

## 2020-06-10 NOTE — Telephone Encounter (Signed)
  Buddy Duty, CMA [employer] auth approved. Berkley Harvey #[id number] valid 06-09-20 thru 07-09-20.          ----- Message -----  From: Julio Sicks, RN  Sent: 06/09/2020 10:27 AM EDT  To: Reesa Chew, CMA, [facility] Sleep Studies   Sleep study order placed

## 2020-06-10 NOTE — Assessment & Plan Note (Signed)
Ongoing, recommend continue daily supplement and adjust as needed.  Recheck level next visit.

## 2020-06-10 NOTE — Assessment & Plan Note (Signed)
In 2004, continue current medication regimen for prevention and collaboration with cardiology. 

## 2020-06-10 NOTE — Assessment & Plan Note (Signed)
Recommend continued focus on healthy diet choices and regular physical activity (30 minutes 5 days a week).  Has poor diet at home, discussed at length with her. 

## 2020-06-10 NOTE — Patient Instructions (Signed)
Miralax -- Colace or Senna S   Heart Failure, Self Care Heart failure is a serious condition. This sheet explains things you need to do to take care of yourself at home. To help you stay as healthy as possible, you may be asked to change your diet, take certain medicines, and make other changes in your life. Your doctor may also give you more specific instructions. If you have problems or questions, call your doctor. What are the risks? Having heart failure makes it more likely for you to have some problems. These problems can get worse if you do not take good care of yourself. Problems may include:  Blood clotting problems. This may cause a stroke.  Damage to the kidneys, liver, or lungs.  Abnormal heart rhythms. Supplies needed:  Scale for weighing yourself.  Blood pressure monitor.  Notebook.  Medicines. How to care for yourself when you have heart failure Medicines Take over-the-counter and prescription medicines only as told by your doctor. Take your medicines every day.  Do not stop taking your medicine unless your doctor tells you to do so.  Do not skip any medicines.  Get your prescriptions refilled before you run out of medicine. This is important. Eating and drinking   Eat heart-healthy foods. Talk with a diet specialist (dietitian) to create an eating plan.  Choose foods that: ? Have no trans fat. ? Are low in saturated fat and cholesterol.  Choose healthy foods, such as: ? Fresh or frozen fruits and vegetables. ? Fish. ? Low-fat (lean) meats. ? Legumes, such as beans, peas, and lentils. ? Fat-free or low-fat dairy products. ? Whole-grain foods. ? High-fiber foods.  Limit salt (sodium) if told by your doctor. Ask your diet specialist to tell you which seasonings are healthy for your heart.  Cook in healthy ways instead of frying. Healthy ways of cooking include roasting, grilling, broiling, baking, poaching, steaming, and stir-frying.  Limit how much  fluid you drink, if told by your doctor. Alcohol use  Do not drink alcohol if: ? Your doctor tells you not to drink. ? Your heart was damaged by alcohol, or you have very bad heart failure. ? You are pregnant, may be pregnant, or are planning to become pregnant.  If you drink alcohol: ? Limit how much you use to:  0-1 drink a day for women.  0-2 drinks a day for men. ? Be aware of how much alcohol is in your drink. In the [place] one drink equals one 12 oz bottle of beer (355 mL), one 5 oz glass of wine (148 mL), or one 1 oz glass of hard liquor (44 mL). Lifestyle   Do not use any products that contain nicotine or tobacco, such as cigarettes, e-cigarettes, and chewing tobacco. If you need help quitting, ask your doctor. ? Do not use nicotine gum or patches before talking to your doctor.  Do not use illegal drugs.  Lose weight if told by your doctor.  Do physical activity if told by your doctor. Talk to your doctor before you begin an exercise if: ? You are an older adult. ? You have very bad heart failure.  Learn to manage stress. If you need help, ask your doctor.  Get rehab (rehabilitation) to help you stay independent and to help with your quality of life.  Plan time to rest when you get tired. Check weight and blood pressure   Weigh yourself every day. This will help you to know if fluid is building up  in your body. ? Weigh yourself every morning after you pee (urinate) and before you eat breakfast. ? Wear the same amount of clothing each time. ? Write down your daily weight. Give your record to your doctor.  Check and write down your blood pressure as told by your doctor.  Check your pulse as told by your doctor. Dealing with very hot and very cold weather  If it is very hot: ? Avoid activities that take a lot of energy. ? Use air conditioning or fans, or find a cooler place. ? Avoid caffeine and alcohol. ? Wear clothing that is loose-fitting, lightweight, and  light-colored.  If it is very cold: ? Avoid activities that take a lot of energy. ? Layer your clothes. ? Wear mittens or gloves, a hat, and a scarf when you go outside. ? Avoid alcohol. Follow these instructions at home:  Stay up to date with shots (vaccines). Get pneumococcal and flu (influenza) shots.  Keep all follow-up visits as told by your doctor. This is important. Contact a doctor if:  You gain weight quickly.  You have increasing shortness of breath.  You cannot do your normal activities.  You get tired easily.  You cough a lot.  You don't feel like eating or feel like you may vomit (nauseous).  You become puffy (swell) in your hands, feet, ankles, or belly (abdomen).  You cannot sleep well because it is hard to breathe.  You feel like your heart is beating fast (palpitations).  You get dizzy when you stand up. Get help right away if:  You have trouble breathing.  You or someone else notices a change in your behavior, such as having trouble staying awake.  You have chest pain or discomfort.  You pass out (faint). These symptoms may be an emergency. Do not wait to see if the symptoms will go away. Get medical help right away. Call your local emergency services (911 in the [place] Do not drive yourself to the hospital. Summary  Heart failure is a serious condition. To care for yourself, you may have to change your diet, take medicines, and make other lifestyle changes.  Take your medicines every day. Do not stop taking them unless your doctor tells you to do so.  Eat heart-healthy foods, such as fresh or frozen fruits and vegetables, fish, lean meats, legumes, fat-free or low-fat dairy products, and whole-grain or high-fiber foods.  Ask your doctor if you can drink alcohol. You may have to stop alcohol use if you have very bad heart failure.  Contact your doctor if you gain weight quickly or feel that your heart is beating too fast. Get help right away if  you pass out, or have chest pain or trouble breathing. This information is not intended to replace advice given to you by your health care provider. Make sure you discuss any questions you have with your health care provider. Document Revised: 11/28/2018 Document Reviewed: 11/29/2018 Elsevier Patient Education  2020 [employer].

## 2020-06-10 NOTE — Assessment & Plan Note (Signed)
Ongoing issue, will continue to involve CCM team to assist. 

## 2020-06-10 NOTE — Assessment & Plan Note (Signed)
Referral back to GYN placed.

## 2020-06-10 NOTE — Progress Notes (Signed)
BP 138/80   Pulse 76   Temp (!) 97.2 F (36.2 C) (Oral)   Ht 5\' 2"  (1.575 m)   Wt 233 lb (105.7 kg)   LMP  (LMP Unknown)   SpO2 100%   BMI 42.62 kg/m    Subjective:    Patient ID: , female    DOB: 13-Jun-1951, 69 y.o.   MRN: [id number]  HPI: Jennifer Grant is a 69 y.o. female  Chief Complaint  Patient presents with  . Hyperlipidemia  . Hypertension  . Hyperglycemia   HYPERTENSION / HYPERLIPIDEMIA/HF Currently taking Losartan, Amlodipine, HCTZ, Isosorbide Mono, Plavix, and ASA + Lovastatin. Last saw cardiology on 06/09/2020, sleep study ordered. On review of notes appears non adherence continues -- she does endorse missing doses.   2016 Echo 50-55%, chronic diastolic HF diagnosis on chart -- a new echo was ordered yesterday.Diet is a barrier for her, she does endorse enjoying foods higher in sodium. No recent NTG use.  History of MI in 2004. Satisfied with current treatment?yes Duration of hypertension:chronic BP monitoring frequency:not checking BP range:  BP medication side effects:no Duration of hyperlipidemia:chronic Cholesterol medication side effects:no Cholesterol supplements: none Medication compliance:good compliance Aspirin:yes Recent stressors:no Recurrent headaches:no Visual changes:no Palpitations:no Dyspnea:none Chest pain:no Lower extremity edema:no Dizzy/lightheaded:no The ASCVD Risk score 2005 DC Jr., et al., 2013) failed to calculate for the following reasons: The patient has a prior MI or stroke diagnosis  ELEVATED A1C: Most recent A1C  6.1% in April.She does endorse eating lots of Jamaican sweet rolls over past months and poor diet choices. Polydipsia/polyuria:no Visual disturbance:no Chest pain:no Paresthesias:no  VITAMIN D DEFICIENCY: On recent labs her Vit D was 22.4 and it was recommended she start taking daily Vitamin D3 1000 units.  She denies any muscle aches, fatigue, recent falls or  fractures.   She is requesting referral back to Dr. May today to discuss her past bladder issues and possible surgical intervention which she refused in past.  Relevant past medical, surgical, family and social history reviewed and updated as indicated. Interim medical history since our last visit reviewed. Allergies and medications reviewed and updated.  Review of Systems  Constitutional: Negative for activity change, appetite change, diaphoresis, fatigue and fever.  Respiratory: Negative for cough, chest tightness and shortness of breath.   Cardiovascular: Negative for chest pain, palpitations and leg swelling.  Gastrointestinal: Negative.   Endocrine: Negative for polydipsia, polyphagia and polyuria.  Neurological: Negative.   Psychiatric/Behavioral: Negative.     Per HPI unless specifically indicated above     Objective:    BP 138/80   Pulse 76   Temp (!) 97.2 F (36.2 C) (Oral)   Ht 5\' 2"  (1.575 m)   Wt 233 lb (105.7 kg)   LMP  (LMP Unknown)   SpO2 100%   BMI 42.62 kg/m   Wt Readings from Last 3 Encounters:  06/10/20 233 lb (105.7 kg)  06/09/20 232 lb (105.2 kg)  04/28/20 229 lb (103.9 kg)    Physical Exam Vitals and nursing note reviewed.  Constitutional:      General: She is awake. She is not in acute distress.    Appearance: She is well-developed and well-groomed. She is morbidly obese. She is not ill-appearing.  HENT:     Head: Normocephalic.     Right Ear: Hearing normal.     Left Ear: Hearing normal.  Eyes:     General: Lids are normal.        Right eye:  No discharge.        Left eye: No discharge.     Conjunctiva/sclera: Conjunctivae normal.     Pupils: Pupils are equal, round, and reactive to light.  Neck:     Thyroid: No thyromegaly.     Vascular: No carotid bruit.  Cardiovascular:     Rate and Rhythm: Normal rate and regular rhythm.     Heart sounds: Normal heart sounds. No murmur heard.  No gallop.   Pulmonary:     Effort: Pulmonary  effort is normal. No accessory muscle usage or respiratory distress.     Breath sounds: Normal breath sounds.  Abdominal:     General: Bowel sounds are normal.     Palpations: Abdomen is soft.     Tenderness: There is no abdominal tenderness.  Musculoskeletal:     Cervical back: Normal range of motion and neck supple.     Right lower leg: Edema (trace) present.     Left lower leg: Edema (trace) present.  Skin:    General: Skin is warm and dry.  Neurological:     Mental Status: She is alert and oriented to person, place, and time.  Psychiatric:        Attention and Perception: Attention normal.        Mood and Affect: Mood normal.        Speech: Speech normal.        Behavior: Behavior normal. Behavior is cooperative.        Thought Content: Thought content normal.    Results for orders placed or performed in visit on 01/15/20  Lipid Panel w/o Chol/HDL Ratio  Result Value Ref Range   Cholesterol, Total 141 100 - 199 mg/dL   Triglycerides 85 0 - 149 mg/dL   HDL 36 (L) >62 mg/dL   VLDL Cholesterol Cal 16 5 - 40 mg/dL   LDL Chol Calc (NIH) 89 0 - 99 mg/dL      Assessment & Plan:   Problem List Items Addressed This Visit      Cardiovascular and Mediastinum   Old MI (myocardial infarction)    In 2004, continue current medication regimen for prevention and collaboration with cardiology.      Essential hypertension    Chronic, ongoing.  Recommend ensuring medication taken daily + checking BP at home at least a few mornings a week.  Continue collaboration with cardiology and review notes.  Continue current medication regimen at this time, suspect adjustments will need to be made in future.  CCM referral is in place.  Obtain labs today.  Return in 6 months.      Relevant Orders   Basic metabolic panel   Microalbumin, Urine Waived   Atherosclerotic heart disease of native coronary artery with other forms of angina pectoris (HCC)    Chronic, ongoing.  Continue current medication  regimen and collaboration with cardiology.  No recent NTG use, will send in refill.      Chronic diastolic heart failure (HCC) - Primary    Chronic, ongoing.  Continue current medication regimen and collaboration with cardiology.  Discussed diet choices.   - Reminded to call for an overnight weight gain of >2 pounds or a weekly weight weight of >5 pounds - not adding salt to his food and has been reading food labels. Reviewed the importance of keeping daily sodium intake to 2000mg  daily         Genitourinary   Cystocele without uterine prolapse    Referral back to  GYN placed.      Relevant Orders   Ambulatory referral to Gynecology     Other   Hyperlipidemia    Chronic, ongoing.  Continue current medication regimen and adjust as needed.  Lipid panel today.  Have recommended she take this daily to help with prevention.  Discussed this at length with patient.      Relevant Orders   Lipid Panel w/o Chol/HDL Ratio   BMI 45.0-49.9, adult (HCC)    Recommend continued focus on healthy diet choices and regular physical activity (30 minutes 5 days a week).  Has poor diet at home, discussed at length with her.      Noncompliance with medication regimen    Ongoing issue, will continue to involve CCM team to assist.      Morbid obesity (HCC)    BMI 42.62 with HTN, OLD MI, HLD. Recommended eating smaller high protein, low fat meals more frequently and exercising 30 mins a day 5 times a week with a goal of 10-15lb weight loss in the next 3 months. Patient voiced their understanding and motivation to adhere to these recommendations.       Elevated hemoglobin A1c    A1C downward trend today at 5.7%, continue diet focus and regular exercise.      Relevant Orders   Bayer DCA Hb A1c Waived   Microalbumin, Urine Waived   Vitamin D deficiency    Ongoing, recommend continue daily supplement and adjust as needed.  Recheck level next visit.          Follow up plan: Return in about 6  months (around 12/09/2020) for HTN/HLD, PREDIABETES, HF -- with new PCP in office previous Dr. Salena Saner patient.

## 2020-06-10 NOTE — Assessment & Plan Note (Signed)
BMI 42.62 with HTN, OLD MI, HLD. Recommended eating smaller high protein, low fat meals more frequently and exercising 30 mins a day 5 times a week with a goal of 10-15lb weight loss in the next 3 months. Patient voiced their understanding and motivation to adhere to these recommendations.

## 2020-06-10 NOTE — Assessment & Plan Note (Signed)
Chronic, ongoing.  Continue current medication regimen and adjust as needed.  Lipid panel today.  Have recommended she take this daily to help with prevention.  Discussed this at length with patient. 

## 2020-06-11 LAB — BASIC METABOLIC PANEL
BUN/Creatinine Ratio: 23 (ref 12–28)
BUN: 15 mg/dL (ref 8–27)
CO2: 25 mmol/L (ref 20–29)
Calcium: 9.9 mg/dL (ref 8.7–10.3)
Chloride: 101 mmol/L (ref 96–106)
Creatinine, Ser: 0.65 mg/dL (ref 0.57–1.00)
GFR calc Af Amer: 103 mL/min/{1.73_m2} (ref >59)
GFR calc non Af Amer: 90 mL/min/{1.73_m2} (ref >59)
Glucose: 82 mg/dL (ref 65–99)
Potassium: 3.8 mmol/L (ref 3.5–5.2)
Sodium: 138 mmol/L (ref 134–144)

## 2020-06-11 LAB — LIPID PANEL W/O CHOL/HDL RATIO
Cholesterol, Total: 150 mg/dL (ref 100–199)
HDL: 39 mg/dL — ABNORMAL LOW (ref >39)
LDL Chol Calc (NIH): 98 mg/dL (ref 0–99)
Triglycerides: 67 mg/dL (ref 0–149)
VLDL Cholesterol Cal: 13 mg/dL (ref 5–40)

## 2020-06-11 NOTE — Progress Notes (Signed)
Good afternoon, please let Jennifer Grant know her labs have returned.  Kidney function and electrolytes continue to be stable.  Cholesterol levels continue to be improved with daily Rosuvastatin, continue this medication.  If any questions let me know.  Have a great day!! Keep being awesome!!  Thank you for allowing me to participate in your care. Kindest regards, Dillin Lofgren

## 2020-06-18 ENCOUNTER — Encounter (INDEPENDENT_AMBULATORY_CARE_PROVIDER_SITE_OTHER): Payer: Medicare HMO

## 2020-06-18 DIAGNOSIS — I480 Paroxysmal atrial fibrillation: Secondary | ICD-10-CM | POA: Diagnosis not present

## 2020-06-19 ENCOUNTER — Telehealth: Payer: Self-pay

## 2020-06-19 NOTE — Telephone Encounter (Signed)
LMVM Returning call regarding patient having questions about her monitor. Left message that Jennifer Grant could also contact [employer] directly if she has questions about the monitor.  Their number is [phone].

## 2020-06-19 NOTE — Telephone Encounter (Signed)
The pt has questions about her holter monitor and her sleep study destination. I told her I will send a phone note to the correct people that can answer her questions. Her phone number is [phone].

## 2020-06-22 ENCOUNTER — Ambulatory Visit (HOSPITAL_BASED_OUTPATIENT_CLINIC_OR_DEPARTMENT_OTHER): Payer: Medicare HMO | Attending: Interventional Cardiology | Admitting: Cardiology

## 2020-06-22 ENCOUNTER — Other Ambulatory Visit: Payer: Self-pay

## 2020-06-22 VITALS — Ht 60.0 in | Wt 232.0 lb

## 2020-06-22 DIAGNOSIS — G4733 Obstructive sleep apnea (adult) (pediatric): Secondary | ICD-10-CM | POA: Diagnosis not present

## 2020-06-22 DIAGNOSIS — R0681 Apnea, not elsewhere classified: Secondary | ICD-10-CM

## 2020-06-22 DIAGNOSIS — R0683 Snoring: Secondary | ICD-10-CM | POA: Diagnosis present

## 2020-06-22 DIAGNOSIS — G4736 Sleep related hypoventilation in conditions classified elsewhere: Secondary | ICD-10-CM | POA: Diagnosis not present

## 2020-06-23 NOTE — Procedures (Signed)
Patient Name: Jennifer Grant, Jennifer Grant Date: 06/22/2020 Gender: Female D.O.B: 05/23/1951 Age (years): 29 Referring Provider: Daneen Schick Height (inches): 37 Interpreting Physician: Fransico Him MD, ABSM Weight (lbs): 232 RPSGT: Earney Hamburg BMI: 45 MRN: [id number] Neck Size: 17.00  CLINICAL INFORMATION Sleep Study Type: Split Night CPAP  Indication for sleep study: N/A  Epworth Sleepiness Score:  SLEEP STUDY TECHNIQUE As per the AASM Manual for the Scoring of Sleep and Associated Events v2.3 (April 2016) with a hypopnea requiring 4% desaturations.  The channels recorded and monitored were frontal, central and occipital EEG, electrooculogram (EOG), submentalis EMG (chin), nasal and oral airflow, thoracic and abdominal wall motion, anterior tibialis EMG, snore microphone, electrocardiogram, and pulse oximetry. Continuous positive airway pressure (CPAP) was initiated when the patient met split night criteria and was titrated according to treat sleep-disordered breathing.  MEDICATIONS Medications self-administered by patient taken the night of the study : N/A  RESPIRATORY PARAMETERS Diagnostic Total AHI (/hr): 22.0  RDI (/hr):22.5  OA Index (/hr): 6.6  CA Index (/hr): 0.0 REM AHI (/hr): N/A  NREM AHI (/hr):22.0  upine AHI (/hr):N/A  Non-supine AHI (/hr):22 Min O2 Sat (%):75.0  Mean O2 (%): 91.9  Time below 88% (min):11.9   Titration Optimal Pressure (cm):14  AHI at Optimal Pressure (/hr):1.2  Min O2 at Optimal Pressure (%):90.0 Supine % at Optimal (%):N/A  Sleep % at Optimal (%):N/A   SLEEP ARCHITECTURE The recording time for the entire night was 396.4 minutes.  During a baseline period of 132.9 minutes, the patient slept for 128.1 minutes in REM and nonREM, yielding a sleep efficiency of 96.5%. Sleep onset after lights out was 3.7 minutes with a REM latency of N/A minutes. The patient spent 0.8% of the night in stage N1 sleep, 99.2% in stage N2 sleep, 0.0% in  stage N3 and 0% in REM.  During the titration period of 251.5 minutes, the patient slept for 236.2 minutes in REM and nonREM, yielding a sleep efficiency of 93.9%. Sleep onset after CPAP initiation was 3.3 minutes with a REM latency of 56.0 minutes. The patient spent 0.6% of the night in stage N1 sleep, 95.6% in stage N2 sleep, 0.0% in stage N3 and 3.8% in REM.  CARDIAC DATA The 2 lead EKG demonstrated sinus rhythm. The mean heart rate was 100.0 beats per minute. Other EKG findings include: None. LEG MOVEMENT DATA The total Periodic Limb Movements of Sleep (PLMS) were 0. The PLMS index was 0.0 .  IMPRESSIONS - Moderate obstructive sleep apnea occurred during the diagnostic portion of the study(AHI = 22.0/hour). An optimal PAP pressure of 14cm H2O was selected for this patient based on the available study data. - No significant central sleep apnea occurred during the diagnostic portion of the study (CAI = 0.0/hour). - Mild oxygen desaturation was noted during the diagnostic portion of the study (Min O2 = 75.0%). - The patient snored with loud snoring volume during the diagnostic portion of the study. - No cardiac abnormalities were noted during this study. - Clinically significant periodic limb movements did not occur during sleep.  DIAGNOSIS - Obstructive Sleep Apnea (G47.33) - Nocturnal Hypoxemia  RECOMMENDATIONS - Recommend ResMed CPAP at 14cm H2O with heated humidity and mask of choice.  - Avoid alcohol, sedatives and other CNS depressants that may worsen sleep apnea and disrupt normal sleep architecture. - Sleep hygiene should be reviewed to assess factors that may improve sleep quality. - Weight management and regular exercise should be initiated or continued. - Return to [place]  for re-evaluation after 8 weeks of therapy  [Electronically signed] 06/23/2020 01:40 PM  Fransico Him MD, ABSM Diplomate, American Board of Sleep Medicine

## 2020-06-24 ENCOUNTER — Telehealth: Payer: Self-pay | Admitting: *Deleted

## 2020-06-24 NOTE — Telephone Encounter (Signed)
-----   Message from Quintella Reichert, MD sent at 06/23/2020  1:43 PM EDT ----- Please let patient know that they have significant sleep apnea and had successful PAP titration and will be set up with PAP unit.  Please let DME know that order is in EPIC.  Please set patient up for OV in 8 weeks

## 2020-06-24 NOTE — Telephone Encounter (Signed)
Informed patient of sleep study results and patient understanding was verbalized. Patient understands her sleep study showed they have significant sleep apnea and had successful PAP titration and will be set up with PAP unit. Please let DME know that order is in EPIC. Please set patient up for OV in 8 weeks    Upon patient request DME selection is [facility]. Patient understands she/he will be contacted by [facility] to set up her/he cpap. Patient understands to call if [facility] does not contact her/he with new setup in a timely manner. Patient understands they will be called once confirmation has been received from [facility] that they have received their new machine to schedule 10 week follow up appointment.   [facility] notified of new cpap order  Please add to airview Patient was grateful for the call and thanked me.

## 2020-06-27 ENCOUNTER — Telehealth: Payer: Self-pay | Admitting: Interventional Cardiology

## 2020-06-27 NOTE — Telephone Encounter (Signed)
New message:    Patient calling stating that she is having some with her device she has taken if off.

## 2020-06-27 NOTE — Telephone Encounter (Signed)
S/w our monitor tech Orpha Bur who will reach out to the pt.

## 2020-06-27 NOTE — Telephone Encounter (Signed)
Returned call to patient and answered all her questions. She is feeling better after moving the monitor. She knows to call [employer] and get sensitive skin electrodes if she ends up needing them

## 2020-06-27 NOTE — Telephone Encounter (Signed)
Jennifer Grant is calling back due to not receiving a callback. She states the monitor is back on, but she is having a reaction to the monitor that is causing a burning sensation and a headache.

## 2020-06-28 ENCOUNTER — Encounter (HOSPITAL_BASED_OUTPATIENT_CLINIC_OR_DEPARTMENT_OTHER): Payer: Medicare HMO | Admitting: Cardiology

## 2020-07-02 ENCOUNTER — Encounter (HOSPITAL_BASED_OUTPATIENT_CLINIC_OR_DEPARTMENT_OTHER): Payer: Medicare HMO | Admitting: Cardiology

## 2020-07-04 ENCOUNTER — Ambulatory Visit (HOSPITAL_COMMUNITY): Payer: Medicare HMO | Attending: Cardiology

## 2020-07-04 ENCOUNTER — Other Ambulatory Visit: Payer: Self-pay

## 2020-07-04 DIAGNOSIS — I5032 Chronic diastolic (congestive) heart failure: Secondary | ICD-10-CM | POA: Diagnosis not present

## 2020-07-04 DIAGNOSIS — I77810 Thoracic aortic ectasia: Secondary | ICD-10-CM

## 2020-07-04 HISTORY — DX: Thoracic aortic ectasia: I77.810

## 2020-07-04 LAB — ECHOCARDIOGRAM COMPLETE
Area-P 1/2: 2.22 cm2
S' Lateral: 3.3 cm

## 2020-07-09 ENCOUNTER — Telehealth: Payer: Self-pay | Admitting: Interventional Cardiology

## 2020-07-09 NOTE — Telephone Encounter (Signed)
Spoke with pt and she was looking for echo results.  Reminded pt that we spoke yesterday in regards to her results.  Briefly reviewed results again and pt began to laugh because she remembered talking to me.  Pt appreciative for call.

## 2020-07-09 NOTE — Telephone Encounter (Signed)
Jennifer Grant is returning Jennifer Grant's call in regards to her Echo results. Please advise.

## 2020-07-14 ENCOUNTER — Telehealth: Payer: Self-pay | Admitting: Interventional Cardiology

## 2020-07-14 NOTE — Telephone Encounter (Signed)
Spoke with Dr. Tanda Rockers and he said due to pt not being on medications for AF and not having any recent documented episodes on a holter or Zio monitor, insurance will not cover the event monitor.  Pt is currently wearing the monitor.  Dr. Tanda Rockers states we can withdraw the monitor and order a Zio instead or have pt continue wearing event monitor and it will be sent to insurance to review again and likely still get denied.  Advised I will send to Dr. Katrinka Blazing for review and we would call back once we hear from him.

## 2020-07-14 NOTE — Telephone Encounter (Signed)
Dr. Tanda Rockers was calling about a Peer to Peer request for a Monitor for this patient. The number provided is a direct number

## 2020-07-15 NOTE — Telephone Encounter (Signed)
Patient is currently wearing the [employer] Event monitor and is on day 28 of 30. We can not switch to a Zio or patient will be billed by both monitors companies. Patient has [employer] and Medicaid. I reached out to [facility] with [employer] and he will get back to me. They sometimes see this happen with [facility]. But patients dont normally end up with a large bill.

## 2020-07-15 NOTE — Telephone Encounter (Signed)
Withdraw and place ZIO instead.

## 2020-07-16 NOTE — Telephone Encounter (Signed)
Help me: Are event monitors the live recordings? Is Zio the Holter monitor like device?

## 2020-07-17 NOTE — Telephone Encounter (Signed)
Heard back from [employer] ([employer] rep) there is no need to do a peer to peer. Because of patients insurance her monitor with convert to a CEM and insurance with cover it. Nothing further is needed from Dr. Katrinka Blazing or our office.

## 2020-07-17 NOTE — Telephone Encounter (Signed)
That is correct.  Zio does have a live option as well but rarely covered.

## 2020-07-17 NOTE — Telephone Encounter (Signed)
Contacted Dr. Tanda Rockers and left message letting him know details below.  Advised to call back if any questions.

## 2020-07-18 ENCOUNTER — Telehealth: Payer: Self-pay | Admitting: Interventional Cardiology

## 2020-07-18 NOTE — Telephone Encounter (Signed)
FYI--Patient called to inform Dr. Katrinka Blazing that she just mailed her heart monitor back.

## 2020-07-18 NOTE — Telephone Encounter (Signed)
Just FYI.

## 2020-07-22 NOTE — Telephone Encounter (Signed)
Thanks

## 2020-09-04 ENCOUNTER — Other Ambulatory Visit: Payer: Self-pay

## 2020-09-04 ENCOUNTER — Telehealth: Payer: Self-pay

## 2020-09-04 ENCOUNTER — Telehealth (INDEPENDENT_AMBULATORY_CARE_PROVIDER_SITE_OTHER): Payer: Medicare HMO | Admitting: Family Medicine

## 2020-09-04 ENCOUNTER — Encounter: Payer: Self-pay | Admitting: Family Medicine

## 2020-09-04 DIAGNOSIS — U071 COVID-19: Secondary | ICD-10-CM | POA: Diagnosis not present

## 2020-09-04 NOTE — Telephone Encounter (Signed)
Copied from CRM [id number]. Topic: General - Inquiry >> Sep 04, 2020  9:51 AM Adrian Prince D wrote: Reason for CRM: Patient tested positive for Covid and wants to get some medication prescribed. Patient states she is having symptoms like a cold. She would like a call back. She can be reached at [phone]. Please advise  Can Pt be added on or schedule for next week? Can add on to Dr.rumball 10:20 or 11:20

## 2020-09-04 NOTE — Progress Notes (Signed)
Virtual Visit via Video Note  I connected with Jennifer Grant on 09/04/20 at 11:20 AM EST by a video enabled telemedicine application and verified that I am speaking with the correct person using two identifiers.  Location: Patient: home Provider: [facility]   I discussed the limitations of evaluation and management by telemedicine and the availability of in person appointments. The patient expressed understanding and agreed to proceed.  History of Present Illness:  UPPER RESPIRATORY TRACT INFECTION - COVID+ at HD - symptom onset 12/30 - unvaccinated Worst symptom: cough, better though Fever: no Cough: yes, phlegm Shortness of breath: no Wheezing: no Chest pain: no Chest congestion: yes Nasal congestion: no Sinus pressure: no Headache: yes Vomiting: no Context: better Recurrent sinusitis: no Relief with OTC cold/cough medications: hasn't had to take  Treatments attempted: Ginger, tumeric, herbs  Observations/Objective:  Well appearing, in NAD. Speaks in full sentences, no resp distress.  Assessment and Plan:  COVID-19 Positive at health dept. Overall doing well with mild sx, improving. Reviewed OTC symptom relief, self-quarantine guidelines, and emergency precautions. Not a candidate for MAB tx as 7 days from symptom onset.     I discussed the assessment and treatment plan with the patient. The patient was provided an opportunity to ask questions and all were answered. The patient agreed with the plan and demonstrated an understanding of the instructions.   The patient was advised to call back or seek an in-person evaluation if the symptoms worsen or if the condition fails to improve as anticipated.  I provided 12 minutes of non-face-to-face time during this encounter.   Caro Laroche, DO

## 2020-09-04 NOTE — Patient Instructions (Signed)
It was great to see you!  Our plans for today:  - See below for self-isolation guidelines. You may end your quarantine once you are 10 days from symptom onset and fever free for 24 hours without use of tylenol or ibuprofen.  - Because you are 7 days out from initial symptom onset, you do not qualify for monoclonal antibody treatment. - I recommend getting vaccinated once you are healed from your current infection. - Certainly, if you are having difficulties breathing or unable to keep down fluids, go to the Emergency Department.   Take care and seek immediate care sooner if you develop any concerns.   Dr. Linwood Dibbles     Person Under Monitoring Name: Jennifer Grant  Location: [address] [place] [address]   Infection Prevention Recommendations for Individuals Confirmed to have, or Being Evaluated for, 2019 Novel Coronavirus (COVID-19) Infection Who Receive Care at Home  Individuals who are confirmed to have, or are being evaluated for, COVID-19 should follow the prevention steps below until a healthcare provider or local or state health department says they can return to normal activities.  Stay home except to get medical care You should restrict activities outside your home, except for getting medical care. Do not go to work, school, or public areas, and do not use public transportation or taxis.  Call ahead before visiting your doctor Before your medical appointment, call the healthcare provider and tell them that you have, or are being evaluated for, COVID-19 infection. This will help the healthcare provider's office take steps to keep other people from getting infected. Ask your healthcare provider to call the local or state health department.  Monitor your symptoms Seek prompt medical attention if your illness is worsening (e.g., difficulty breathing). Before going to your medical appointment, call the healthcare provider and tell them that you have, or are being  evaluated for, COVID-19 infection. Ask your healthcare provider to call the local or state health department.  Wear a facemask You should wear a facemask that covers your nose and mouth when you are in the same room with other people and when you visit a healthcare provider. People who live with or visit you should also wear a facemask while they are in the same room with you.  Separate yourself from other people in your home As much as possible, you should stay in a different room from other people in your home. Also, you should use a separate bathroom, if available.  Avoid sharing household items You should not share dishes, drinking glasses, cups, eating utensils, towels, bedding, or other items with other people in your home. After using these items, you should wash them thoroughly with soap and water.  Cover your coughs and sneezes Cover your mouth and nose with a tissue when you cough or sneeze, or you can cough or sneeze into your sleeve. Throw used tissues in a lined trash can, and immediately wash your hands with soap and water for at least 20 seconds or use an alcohol-based hand rub.  Wash your [employer] your hands often and thoroughly with soap and water for at least 20 seconds. You can use an alcohol-based hand sanitizer if soap and water are not available and if your hands are not visibly dirty. Avoid touching your eyes, nose, and mouth with unwashed hands.   Prevention Steps for Caregivers and Household Members of Individuals Confirmed to have, or Being Evaluated for, COVID-19 Infection Being Cared for in the Home  If  you live with, or provide care at home for, a person confirmed to have, or being evaluated for, COVID-19 infection please follow these guidelines to prevent infection:  Follow healthcare provider's instructions Make sure that you understand and can help the patient follow any healthcare provider instructions for all care.  Provide for the patient's  basic needs You should help the patient with basic needs in the home and provide support for getting groceries, prescriptions, and other personal needs.  Monitor the patient's symptoms If they are getting sicker, call his or her medical provider and tell them that the patient has, or is being evaluated for, COVID-19 infection. This will help the healthcare provider's office take steps to keep other people from getting infected. Ask the healthcare provider to call the local or state health department.  Limit the number of people who have contact with the patient  If possible, have only one caregiver for the patient.  Other household members should stay in another home or place of residence. If this is not possible, they should stay  in another room, or be separated from the patient as much as possible. Use a separate bathroom, if available.  Restrict visitors who do not have an essential need to be in the home.  Keep older adults, very young children, and other sick people away from the patient Keep older adults, very young children, and those who have compromised immune systems or chronic health conditions away from the patient. This includes people with chronic heart, lung, or kidney conditions, diabetes, and cancer.  Ensure good ventilation Make sure that shared spaces in the home have good air flow, such as from an air conditioner or an opened window, weather permitting.  Wash your hands often  Wash your hands often and thoroughly with soap and water for at least 20 seconds. You can use an alcohol based hand sanitizer if soap and water are not available and if your hands are not visibly dirty.  Avoid touching your eyes, nose, and mouth with unwashed hands.  Use disposable paper towels to dry your hands. If not available, use dedicated cloth towels and replace them when they become wet.  Wear a facemask and gloves  Wear a disposable facemask at all times in the room and gloves  when you touch or have contact with the patient's blood, body fluids, and/or secretions or excretions, such as sweat, saliva, sputum, nasal mucus, vomit, urine, or feces.  Ensure the mask fits over your nose and mouth tightly, and do not touch it during use.  Throw out disposable facemasks and gloves after using them. Do not reuse.  Wash your hands immediately after removing your facemask and gloves.  If your personal clothing becomes contaminated, carefully remove clothing and launder. Wash your hands after handling contaminated clothing.  Place all used disposable facemasks, gloves, and other waste in a lined container before disposing them with other household waste.  Remove gloves and wash your hands immediately after handling these items.  Do not share dishes, glasses, or other household items with the patient  Avoid sharing household items. You should not share dishes, drinking glasses, cups, eating utensils, towels, bedding, or other items with a patient who is confirmed to have, or being evaluated for, COVID-19 infection.  After the person uses these items, you should wash them thoroughly with soap and water.  Wash laundry thoroughly  Immediately remove and wash clothes or bedding that have blood, body fluids, and/or secretions or excretions, such as sweat, saliva,  sputum, nasal mucus, vomit, urine, or feces, on them.  Wear gloves when handling laundry from the patient.  Read and follow directions on labels of laundry or clothing items and detergent. In general, wash and dry with the warmest temperatures recommended on the label.  Clean all areas the individual has used often  Clean all touchable surfaces, such as counters, tabletops, doorknobs, bathroom fixtures, toilets, phones, keyboards, tablets, and bedside tables, every day. Also, clean any surfaces that may have blood, body fluids, and/or secretions or excretions on them.  Wear gloves when cleaning surfaces the patient  has come in contact with.  Use a diluted bleach solution (e.g., dilute bleach with 1 part bleach and 10 parts water) or a household disinfectant with a label that says EPA-registered for coronaviruses. To make a bleach solution at home, add 1 tablespoon of bleach to 1 quart (4 cups) of water. For a larger supply, add  cup of bleach to 1 gallon (16 cups) of water.  Read labels of cleaning products and follow recommendations provided on product labels. Labels contain instructions for safe and effective use of the cleaning product including precautions you should take when applying the product, such as wearing gloves or eye protection and making sure you have good ventilation during use of the product.  Remove gloves and wash hands immediately after cleaning.  Monitor yourself for signs and symptoms of illness Caregivers and household members are considered close contacts, should monitor their health, and will be asked to limit movement outside of the home to the extent possible. Follow the monitoring steps for close contacts listed on the symptom monitoring form.   ? If you have additional questions, contact your local health department or call the epidemiologist on call at [phone] (available 24/7). ? This guidance is subject to change. For the most up-to-date guidance from [facility], please refer to their website: [email or web address]

## 2020-09-04 NOTE — Assessment & Plan Note (Signed)
Positive at health dept. Overall doing well with mild sx, improving. Reviewed OTC symptom relief, self-quarantine guidelines, and emergency precautions. Not a candidate for MAB tx as 7 days from symptom onset.

## 2020-09-09 ENCOUNTER — Telehealth: Payer: Medicare HMO | Admitting: Family Medicine

## 2020-09-16 ENCOUNTER — Encounter: Payer: Self-pay | Admitting: Family Medicine

## 2020-09-16 ENCOUNTER — Ambulatory Visit (INDEPENDENT_AMBULATORY_CARE_PROVIDER_SITE_OTHER): Payer: Medicare HMO | Admitting: Family Medicine

## 2020-09-16 DIAGNOSIS — Z1211 Encounter for screening for malignant neoplasm of colon: Secondary | ICD-10-CM | POA: Diagnosis not present

## 2020-09-16 DIAGNOSIS — J040 Acute laryngitis: Secondary | ICD-10-CM | POA: Diagnosis not present

## 2020-09-16 DIAGNOSIS — U071 COVID-19: Secondary | ICD-10-CM

## 2020-09-16 MED ORDER — PREDNISONE 50 MG PO TABS: 50.0000 mg | ORAL_TABLET | Freq: Every day | ORAL | 0 refills | Status: DC

## 2020-09-16 MED ORDER — SUCRALFATE 1 G PO TABS: 1.0000 g | ORAL_TABLET | Freq: Three times a day (TID) | ORAL | 2 refills | Status: DC

## 2020-09-16 NOTE — Progress Notes (Signed)
LMP  (LMP Unknown)    Subjective:    Patient ID: Jennifer Grant, female    DOB: [birth year], 70 y.o.   MRN: [id number]  HPI: Jennifer Grant is a 70 y.o. female  Chief Complaint  Patient presents with  . covid question    Patient was positive for covid on January 6. Patient wants to know if she able to go visit others now.   Elpidio Eric    Patient states she is feeling hoarse, patient is wondering if its from the amlodipine.    Was diagnosed with COVID on 1/6 and started with symptoms on 12/30. Feeling well now.   UPPER RESPIRATORY TRACT INFECTION Duration: 2.5 weeks Worst symptom: hoarse Fever: no Cough: no Shortness of breath: no Wheezing: no Chest pain: no Chest tightness: no Chest congestion: no Nasal congestion: no Runny nose: no Post nasal drip: no Sneezing: no Sore throat: yes Swollen glands: no Sinus pressure: no Headache: no Face pain: no Toothache: no Ear pain: no  Ear pressure: no  Eyes red/itching:no Eye drainage/crusting: no  Vomiting: no Rash: no Fatigue: yes Sick contacts: no Strep contacts: no  Context: better Recurrent sinusitis: no Relief with OTC cold/cough medications: no  Treatments attempted: mucinex    Relevant past medical, surgical, family and social history reviewed and updated as indicated. Interim medical history since our last visit reviewed. Allergies and medications reviewed and updated.  Review of Systems  Constitutional: Negative.   HENT: Positive for voice change. Negative for congestion, dental problem, drooling, ear discharge, ear pain, facial swelling, hearing loss, mouth sores, nosebleeds, postnasal drip, rhinorrhea, sinus pressure, sinus pain, sneezing, sore throat, tinnitus and trouble swallowing.   Respiratory: Negative.   Cardiovascular: Negative.   Gastrointestinal: Negative.        Burning in her belly  Musculoskeletal: Positive for myalgias. Negative for arthralgias, back pain, gait problem, joint swelling,  neck pain and neck stiffness.  Skin: Negative.   Psychiatric/Behavioral: Negative.     Per HPI unless specifically indicated above     Objective:    LMP  (LMP Unknown)   Wt Readings from Last 3 Encounters:  06/22/20 232 lb (105.2 kg)  06/10/20 233 lb (105.7 kg)  06/09/20 232 lb (105.2 kg)    Physical Exam Vitals and nursing note reviewed.  Pulmonary:     Effort: Pulmonary effort is normal. No respiratory distress.     Comments: Speaking in full sentences Neurological:     Mental Status: She is alert.  Psychiatric:        Mood and Affect: Mood normal.        Behavior: Behavior normal.        Thought Content: Thought content normal.        Judgment: Judgment normal.     Results for orders placed or performed in visit on 07/04/20  ECHOCARDIOGRAM COMPLETE  Result Value Ref Range   Area-P 1/2 2.22 cm2   S' Lateral 3.30 cm      Assessment & Plan:   Problem List Items Addressed This Visit      Other   COVID-19    Out of quarantine. Call with any concerns. Continue to monitor.        Other Visit Diagnoses    Laryngitis    -  Primary   Will start on steroids. Conitnue to monitor. Call if not getting better or getting worse.    Screening for colon cancer       Referral placed today  Relevant Orders   Ambulatory referral to Gastroenterology       Follow up plan: Return As scheduled.   . This visit was completed via telephone due to the restrictions of the COVID-19 pandemic. All issues as above were discussed and addressed but no physical exam was performed. If it was felt that the patient should be evaluated in the office, they were directed there. The patient verbally consented to this visit. Patient was unable to complete an audio/visual visit due to Lack of equipment. Due to the catastrophic nature of the COVID-19 pandemic, this visit was done through audio contact only. . Location of the patient: home . Location of the provider: home . Those involved with  this call:  . Provider: Olevia Perches, DO . CMA: Rolley Sims, CMA . Front Desk/Registration: Harriet Pho  . Time spent on call: 21 minutes on the phone discussing health concerns. 30 minutes total spent in review of patient's record and preparation of their chart.

## 2020-09-16 NOTE — Assessment & Plan Note (Signed)
Out of quarantine. Call with any concerns. Continue to monitor.

## 2020-09-23 ENCOUNTER — Encounter: Payer: Medicare HMO | Admitting: Obstetrics and Gynecology

## 2020-09-23 ENCOUNTER — Ambulatory Visit (INDEPENDENT_AMBULATORY_CARE_PROVIDER_SITE_OTHER): Payer: Medicare HMO | Admitting: Obstetrics and Gynecology

## 2020-09-23 ENCOUNTER — Encounter: Payer: Self-pay | Admitting: Obstetrics and Gynecology

## 2020-09-23 ENCOUNTER — Other Ambulatory Visit: Payer: Self-pay

## 2020-09-23 VITALS — BP 173/82 | HR 84 | Ht 60.0 in | Wt 229.9 lb

## 2020-09-23 DIAGNOSIS — N898 Other specified noninflammatory disorders of vagina: Secondary | ICD-10-CM

## 2020-09-23 DIAGNOSIS — N812 Incomplete uterovaginal prolapse: Secondary | ICD-10-CM

## 2020-09-23 NOTE — Progress Notes (Signed)
GYNECOLOGY CLINIC PROGRESS NOTE Subjective:     Jennifer Grant is a 70 y.o. female here for a pelvic exam.  She has a history of moderate cystocele.  Current complaints: vaginal odor. Intermittent vaginal discharge.  She was last seen by a GYN in 2016 (at [facility] by me) for her cystocele. Notes that she just wants to get checked out as it has been a while.   Gynecologic History No LMP recorded (lmp unknown). Patient is postmenopausal. Contraception: post menopausal status Last Pap: > 5 years ago. Results were: normal Last mammogram: 01/11/2017. Results were: normal  Obstetric History OB History  No obstetric history on file.     The following portions of the patient's history were reviewed and updated as appropriate:  She  has a past medical history of CAD (coronary artery disease), Diastolic dysfunction, 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, GERD (gastroesophageal reflux disease), Hyperlipidemia, Hypertensive heart disease, Menopausal state, Morbid obesity (HCC), and Noncompliance.   She  has a past surgical history that includes Knee arthroscopy (Left, 1990's); Coronary angioplasty with stent; Cardiac catheterization (N/A, 06/08/2015); Cardiac catheterization (N/A, 06/08/2015); Cardiac catheterization (N/A, 07/16/2015); and Tubal ligation (~ 1978).   Her family history includes Healthy in her brother and mother; Kidney disease in her father; Prostate cancer in her father.   She  reports that she has never smoked. She has never used smokeless tobacco. She reports that she does not drink alcohol and does not use drugs.   Current Outpatient Medications on File Prior to Visit  Medication Sig Dispense Refill  . amLODipine (NORVASC) 10 MG tablet Take 1 tablet (10 mg total) by mouth daily. 90 tablet 4  . aspirin EC 81 MG tablet Take 81 mg by mouth daily.    . cholecalciferol (VITAMIN D3) 25 MCG (1000 UNIT) tablet Take 1,000 Units by mouth daily.    . clopidogrel (PLAVIX) 75 MG tablet Take 1 tablet (75 mg  total) by mouth daily. 90 tablet 4  . hydrochlorothiazide (HYDRODIURIL) 25 MG tablet Take 1 tablet (25 mg total) by mouth daily. 90 tablet 4  . isosorbide mononitrate (IMDUR) 60 MG 24 hr tablet Take 1 tablet by mouth once daily 90 tablet 2  . losartan (COZAAR) 100 MG tablet Take 1 tablet (100 mg total) by mouth daily. 90 tablet 4  . metoprolol succinate (TOPROL-XL) 100 MG 24 hr tablet Take 1 tablet (100 mg total) by mouth daily. Take with or immediately following a meal. 90 tablet 3  . nitroGLYCERIN (NITROSTAT) 0.4 MG SL tablet DISSOLVE ONE TABLET UNDER THE TONGUE EVERY 5 MINUTES AS NEEDED FOR CHEST PAIN.  DO NOT EXCEED A TOTAL OF 3 DOSES IN 15 MINUTES 25 tablet 1  . potassium chloride SA (KLOR-CON) 20 MEQ tablet Take 1 tablet (20 mEq total) by mouth daily. 90 tablet 1  . rosuvastatin (CRESTOR) 40 MG tablet Take 1 tablet (40 mg total) by mouth daily. 90 tablet 3  . spironolactone (ALDACTONE) 25 MG tablet Take 0.5 tablets (12.5 mg total) by mouth daily. 45 tablet 3  . UNABLE TO FIND Neo-Cell    . sucralfate (CARAFATE) 1 g tablet Take 1 tablet (1 g total) by mouth 4 (four) times daily -  with meals and at bedtime. (Patient not taking: Reported on 09/23/2020) 120 tablet 2   No current facility-administered medications on file prior to visit.   She is allergic to brilinta [ticagrelor], lipitor [atorvastatin], latex, penicillins, and tekturna [aliskiren]..   Review of Systems Constitutional: negative for chills, fatigue, fevers  and sweats Eyes: negative for irritation, redness and visual disturbance Ears, nose, mouth, throat, and face: negative for hearing loss, nasal congestion, snoring and tinnitus Respiratory: negative for asthma, cough, sputum Cardiovascular: negative for chest pain, dyspnea, exertional chest pressure/discomfort, irregular heart beat, palpitations and syncope Gastrointestinal: negative for abdominal pain, change in bowel habits, nausea and vomiting Genitourinary: negative for  abnormal menstrual periods, genital lesions, sexual problems and vaginal discharge, dysuria and urinary incontinence. Positive for vaginal bulge, sometimes splitting urinary stream.  Integument/breast: negative for breast lump, breast tenderness and nipple discharge Hematologic/lymphatic: negative for bleeding and easy bruising Musculoskeletal:negative for back pain and muscle weakness Neurological: negative for dizziness, headaches, vertigo and weakness Endocrine: negative for diabetic symptoms including polydipsia, polyuria and skin dryness Allergic/Immunologic: negative for hay fever and urticaria     Objective:    BP (!) 173/82   Pulse 84   Ht 5' (1.524 m)   Wt 229 lb 14.4 oz (104.3 kg)   LMP  (LMP Unknown)   BMI 44.90 kg/m  General appearance: alert and no distress Abdomen: soft, non-tender; bowel sounds normal; no masses,  no organomegaly Pelvic:              VULVA: normal appearing vulva with no masses, tenderness or lesions             VAGINA: normal appearing vagina with no discharge, no lesions, atrophic. Grade 3 cystocele, with Grade 1-2 rectocele and incomplete uterine prolapse (Graade 2)             CERVIX: normal appearing cervix without discharge or lesions             UTERUS: uterus is normal size, shape, consistency and nontender, ADNEXA: normal adnexa in size, nontender and no masses             RECTAL: normal rectal, no masses, rectal exam not indicated. Extremities: extremities normal, atraumatic, no cyanosis or edema Neurologic: Grossly normal    Assessment:   1. Vaginal odor   2. 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 Vaginal odor not noted today. No evidence of vaginal discharge or signs of infection. Discussed OTC remedies for restoring pH balance.  2. Pelvic organ prolapse present. Has been ongoing for at least 8-10 years, slowly progressing. Exam today notes progression since last visit 5 years ago. Tried pessary for  a few months when last evaluated but noted that it was cumbersome and so she discontinued use. Patient notes that she thinks she would like to consider surgery at this time.  Given handout to review on surgical option (A/P repair, with or without hysterectomy). Will f/u in 2-3 weeks for further discussion of surgery and preop.    A total of 30 minutes were spent face-to-face with the patient during the encounter with greater than 50% dealing with counseling and coordination of care.   Hildred Laser, MD [facility]

## 2020-09-23 NOTE — Progress Notes (Signed)
Pt present for cystocele w/o uterine prolapse.

## 2020-09-23 NOTE — Patient Instructions (Signed)
Anterior and Posterior Colporrhaphy  Anterior and posterior colporrhaphy are surgical procedures that treat weakness in the front wall (anterior) or back wall (posterior) of the vagina. This weakness can result in a condition called pelvic organ prolapse. Pelvic organ prolapse can cause urine leakage, a condition called incontinence. This surgery is done through incisions in the vagina. You may need this surgery if pelvic organ prolapse causes symptoms that interfere with your daily life and cannot be corrected with other treatments. The type of colporrhaphy that is done depends on the type of prolapse. Types of pelvic organ prolapse include the following:  Cystocele. This is a prolapse of the bladder and the upper part of the front wall of the vagina.  Rectocele. This is a prolapse of the rectum and the lower part of the back wall of the vagina.  Enterocele. This is a prolapse of the small intestine. It appears as a bulge under the neck of the uterus at the top of the back wall of the vagina.  Procidentia. This is a complete prolapse of the uterus and the cervix. The prolapse can be seen and felt coming out of the vagina. Tell a health care provider about:  Any allergies you have.  All medicines you are taking, including vitamins, herbs, eye drops, creams, and over-the-counter medicines.  Any problems you or family members have had with anesthetic medicines.  Any blood disorders you have.  Any surgeries you have had.  Any medical conditions you have.  Smoking history or history of alcohol use.  Whether you are pregnant or may be pregnant. What are the risks? Generally, this is a safe procedure. However, problems may occur, including:  Infection.  Bleeding.  Allergic reactions to medicines.  Damage to nearby structures, organs, or nerves.  Problems urinating, or incontinence.  Painful sex.  Constipation.  A blood clot that can break free and travel to your lungs. What  happens before the procedure? Staying hydrated Follow instructions from your health care provider about hydration, which may include:  Up to 2 hours before the procedure - you may continue to drink clear liquids, such as water, clear fruit juice, black coffee, and plain tea.   Eating and drinking restrictions Follow instructions from your health care provider about eating and drinking, which may include:  8 hours before the procedure - stop eating heavy meals or foods, such as meat, fried foods, or fatty foods.  6 hours before the procedure - stop eating light meals or foods, such as toast or cereal.  6 hours before the procedure - stop drinking milk or drinks that contain milk.  2 hours before the procedure - stop drinking clear liquids. Medicines Ask your health care provider about:  Changing or stopping your regular medicines. This is especially important if you are taking diabetes medicines or blood thinners.  Taking medicines, such as aspirin and ibuprofen. These medicines can thin your blood. Do not take these medicines unless your health care provider tells you to take them.  Taking over-the-counter medicines, vitamins, herbs, and supplements. Surgery safety Ask your health care provider what steps will be taken to help prevent infection. These steps may include:  Removing hair at the surgery site.  Washing skin with a germ-killing soap.  Taking antibiotic medicine. General instructions  You may be told to use estrogen cream in your vagina to help prevent complications and promote healing.  Do not use any products that contain nicotine or tobacco for at least 4 weeks before the  procedure. These products include cigarettes, chewing tobacco, and vaping devices, such as e-cigarettes. If you need help quitting, ask your health care provider.  Plan to have a responsible adult take you home from the hospital or clinic.  Arrange for someone to help you with activities during  recovery.  Plan to have a responsible adult care for you for the time you are told after you leave the hospital or clinic. This is important. What happens during the procedure?  An IV will be inserted into one of your veins.  You will be given one or more of the following: ? A medicine to help you relax (sedative). ? A medicine to make you fall asleep (general anesthetic).  You will lie down on the operating table with your feet in stirrups.  A small, thin tube (catheter) will be inserted through your urethra into your bladder to drain urine during surgery and recovery.  An instrument (vaginal speculum) will be used to hold your vagina open.  Your health care provider will perform the procedure according to the type of repair you require. ? To perform anterior repair:  An incision will be made in the midline section of the front part of the vaginal wall.  A triangular-shaped piece of vaginal tissue will be removed.  The stronger, healthier tissue will be sewn together to support the bladder.  These incisions may be closed with stitches (sutures).  Gauze packing will be placed inside your vagina. ? To perform posterior repair:  An incision will be made midline on the back wall of the vagina.  A triangular portion of vaginal skin will be removed to expose the muscle.  Excess tissue will be removed, and stronger, healthier muscle and ligament tissue will be sewn together to support the rectum or small intestine.  These incisions may be closed with sutures.  Gauze packing will be placed inside your vagina. ? To perform anterior and posterior repair:  Both procedures will be done during the same surgery. This is done for procidentia prolapse. The procedure may vary among health care providers and hospitals. What happens after the procedure?  Your blood pressure, heart rate, breathing rate, and blood oxygen level will be monitored until you leave the hospital or clinic.  You  will be given pain medicine as needed.  You will have a catheter in place to drain your bladder. This will be in place until your bladder is working properly on its own.  You may have a gauze packing in your vagina for a few days to prevent bleeding.  You will be encouraged to eat and drink a regular diet.  You will be encouraged to get up and walk as soon as you are able.  You may need to wear compression stockings. These stockings help to prevent blood clots and reduce swelling in your legs. Summary  Anterior and posterior colporrhaphy are surgical procedures that treat weakness in the front wall (anterior) or back wall (posterior) of the vagina.  The type of repair that is done depends on the type of prolapse that is present.  Follow instructions from your health care provider about eating and drinking before the procedure.  You will be given a general anesthetic to make you fall asleep during the procedure. This information is not intended to replace advice given to you by your health care provider. Make sure you discuss any questions you have with your health care provider. Document Revised: 02/12/2020 Document Reviewed: 02/12/2020 Elsevier Patient Education  2021 Elsevier  Inc.  

## 2020-10-02 ENCOUNTER — Other Ambulatory Visit: Payer: Self-pay

## 2020-10-03 ENCOUNTER — Encounter: Payer: Self-pay | Admitting: Nurse Practitioner

## 2020-10-03 ENCOUNTER — Other Ambulatory Visit: Payer: Self-pay

## 2020-10-03 ENCOUNTER — Ambulatory Visit (INDEPENDENT_AMBULATORY_CARE_PROVIDER_SITE_OTHER): Payer: Medicare HMO | Admitting: Nurse Practitioner

## 2020-10-03 VITALS — BP 136/86 | HR 76 | Temp 98.6°F | Ht 61.69 in | Wt 227.2 lb

## 2020-10-03 DIAGNOSIS — G8929 Other chronic pain: Secondary | ICD-10-CM | POA: Insufficient documentation

## 2020-10-03 DIAGNOSIS — U071 COVID-19: Secondary | ICD-10-CM | POA: Diagnosis not present

## 2020-10-03 DIAGNOSIS — R1012 Left upper quadrant pain: Secondary | ICD-10-CM | POA: Diagnosis not present

## 2020-10-03 DIAGNOSIS — R5383 Other fatigue: Secondary | ICD-10-CM | POA: Diagnosis not present

## 2020-10-03 DIAGNOSIS — M5441 Lumbago with sciatica, right side: Secondary | ICD-10-CM | POA: Diagnosis not present

## 2020-10-03 LAB — MICROSCOPIC EXAMINATION
Cast Type: NONE SEEN
Casts: NONE SEEN /lpf
Crystal Type: NONE SEEN
Crystals: NONE SEEN
Mucus, UA: NONE SEEN
RBC, Urine: NONE SEEN /hpf (ref 0–2)
Renal Epithel, UA: NONE SEEN /hpf
Trichomonas, UA: NONE SEEN
Yeast, UA: NONE SEEN

## 2020-10-03 LAB — URINALYSIS, ROUTINE W REFLEX MICROSCOPIC
Bilirubin, UA: NEGATIVE
Glucose, UA: NEGATIVE
Ketones, UA: NEGATIVE
Nitrite, UA: NEGATIVE
Protein,UA: NEGATIVE
RBC, UA: NEGATIVE
Specific Gravity, UA: 1.015 (ref 1.005–1.030)
Urobilinogen, Ur: 0.2 mg/dL (ref 0.2–1.0)
pH, UA: 7.5 (ref 5.0–7.5)

## 2020-10-03 MED ORDER — GABAPENTIN 100 MG PO CAPS: 100.0000 mg | ORAL_CAPSULE | Freq: Three times a day (TID) | ORAL | 3 refills | Status: DC | PRN

## 2020-10-03 NOTE — Assessment & Plan Note (Signed)
Reports this as ongoing issue for years, wax and wanes.  Suspect related to constipation issues, as improves per her report with Colace.  Will continue Colace at home and obtain labs today to include CMP, Lipase, Amylase, TSH, and UA.  Consider imaging in future if ongoing discomfort.  Recommend continue Colace daily and add on Miralax OTC every morning to aide bowels.  Return as scheduled in April, sooner if worsening.

## 2020-10-03 NOTE — Assessment & Plan Note (Signed)
Chronic issue, which she reports waxing and waning with.  Worse after Covid due to decreased mobility while sick.  No red flag signs.  Will obtain imaging of lumbar spine, as suspect some DDD present with possible compression on nerve intermittently.  Will trial Gabapentin 100 MG TID PRN -- script sent.  Recent kidney function stable with GFR 103.  Recommend gentle stretching at home daily and moderate weight loss.  Return to office as scheduled in April, sooner if any worsening symptoms.

## 2020-10-03 NOTE — Progress Notes (Signed)
BP 136/86   Pulse 76   Temp 98.6 F (37 C) (Oral)   Ht 5' 1.69" (1.567 m)   Wt 227 lb 3.2 oz (103.1 kg)   LMP  (LMP Unknown)   SpO2 98%   BMI 41.97 kg/m    Subjective:    Patient ID: Jennifer Grant, female    DOB: Jun 25, 1951, 70 y.o.   MRN: [id number]  HPI: Jennifer Grant is a 70 y.o. female  Chief Complaint  Patient presents with  . Leg Pain    B/L leg pain since covid  . Abdominal Pain    LUQ   BACK/LEG PAIN Reports ongoing lower back and bilateral leg pain since Covid and she is concerned about this.  She also reports having leg pain before Covid and that this has been an ongoing burning discomfort and not pain more to R then L.  She reports right leg is where they did heart procedure years ago.  Denies consistent back pain.  Burning sensation to right leg goes down to toes when it comes. Duration: chronic Involved leg: bilateral R>L Mechanism of injury: unknown Location:diffuse Onset: gradual Severity: numbness  Quality:  numbness Frequency: intermittent Radiation: no Aggravating factors: walking and movement  Alleviating factors: legs in warm water  Status: fluctuating Treatments attempted: warm water  Relief with NSAIDs?:  No NSAIDs Taken Weakness with weight bearing or walking: at time Sensation of giving way: no Locking: no Popping: no Bruising: no Swelling: no Redness: no Paresthesias/decreased sensation: yes Fevers: no   ABDOMINAL PAIN  Has been present on and off since previous PCP cared for her.  To LUQ on and off.  Reports she has bladder issues from having babies, which is why she is seeing Dr. Valentino Saxon.  States she needs a good stool softener -- at this time takes Colace.  Was given information on 09/23/20 with Dr. Valentino Saxon on A/P repair with or without hysterectomy -- she plans on doing without.   Duration:months Onset: ongoing Severity: mild Quality: dull and aching, cramping Location: LUQ Episode duration: minutes Radiation: yes Frequency:  intermittent Alleviating factors:  Aggravating factors: Status: fluctuating Treatments attempted: stool softeners Fever: no Nausea: no Vomiting: no Weight loss: no Decreased appetite: no Diarrhea: no Constipation: yes Blood in stool: no Heartburn: no Jaundice: no Rash: no Dysuria/urinary frequency: no Hematuria: no History of sexually transmitted disease: no Recurrent NSAID use: no  Relevant past medical, surgical, family and social history reviewed and updated as indicated. Interim medical history since our last visit reviewed. Allergies and medications reviewed and updated.  Review of Systems  Constitutional: Negative for activity change, appetite change, diaphoresis, fatigue and fever.  Respiratory: Negative for cough, chest tightness and shortness of breath.   Cardiovascular: Negative for chest pain, palpitations and leg swelling.  Gastrointestinal: Positive for abdominal pain and constipation. Negative for abdominal distention, blood in stool, diarrhea, nausea, rectal pain and vomiting.  Musculoskeletal: Positive for back pain.  Neurological: Negative.   Psychiatric/Behavioral: Negative.     Per HPI unless specifically indicated above     Objective:    BP 136/86   Pulse 76   Temp 98.6 F (37 C) (Oral)   Ht 5' 1.69" (1.567 m)   Wt 227 lb 3.2 oz (103.1 kg)   LMP  (LMP Unknown)   SpO2 98%   BMI 41.97 kg/m   Wt Readings from Last 3 Encounters:  10/03/20 227 lb 3.2 oz (103.1 kg)  09/23/20 229 lb 14.4 oz (104.3 kg)  06/22/20 232 lb (105.2 kg)    Physical Exam Vitals and nursing note reviewed.  Constitutional:      General: She is awake. She is not in acute distress.    Appearance: She is well-developed and well-groomed. She is morbidly obese. She is not ill-appearing.  HENT:     Head: Normocephalic.     Right Ear: Hearing normal.     Left Ear: Hearing normal.  Eyes:     General: Lids are normal.        Right eye: No discharge.        Left eye: No  discharge.     Conjunctiva/sclera: Conjunctivae normal.     Pupils: Pupils are equal, round, and reactive to light.  Neck:     Thyroid: No thyromegaly.     Vascular: No carotid bruit.  Cardiovascular:     Rate and Rhythm: Normal rate and regular rhythm.     Heart sounds: Normal heart sounds. No murmur heard. No gallop.   Pulmonary:     Effort: Pulmonary effort is normal. No accessory muscle usage or respiratory distress.     Breath sounds: Normal breath sounds.  Abdominal:     General: Bowel sounds are normal. There is no distension.     Palpations: Abdomen is soft.     Tenderness: There is abdominal tenderness in the left upper quadrant. There is no right CVA tenderness or left CVA tenderness.     Hernia: No hernia is present.     Comments: Mild discomfort with palpation to LUQ, midline below breast.  Musculoskeletal:     Cervical back: Normal range of motion and neck supple.     Lumbar back: No swelling, lacerations or tenderness. Normal range of motion. Negative right straight leg raise test and negative left straight leg raise test. No scoliosis.     Right lower leg: Edema (trace) present.     Left lower leg: Edema (trace) present.     Comments: Discomfort with ROM of lower back on lateral R and L + with flexion and extension.  Negative straight leg bilaterally.  No discomfort to palpation.  Good sensation. Negative Homans bilaterally.  Skin:    General: Skin is warm and dry.  Neurological:     Mental Status: She is alert and oriented to person, place, and time.  Psychiatric:        Attention and Perception: Attention normal.        Mood and Affect: Mood normal.        Speech: Speech normal.        Behavior: Behavior normal. Behavior is cooperative.        Thought Content: Thought content normal.    Results for orders placed or performed in visit on 07/04/20  ECHOCARDIOGRAM COMPLETE  Result Value Ref Range   Area-P 1/2 2.22 cm2   S' Lateral 3.30 cm      Assessment &  Plan:   Problem List Items Addressed This Visit      Nervous and Auditory   Chronic bilateral low back pain with right-sided sciatica - Primary    Chronic issue, which she reports waxing and waning with.  Worse after Covid due to decreased mobility while sick.  No red flag signs.  Will obtain imaging of lumbar spine, as suspect some DDD present with possible compression on nerve intermittently.  Will trial Gabapentin 100 MG TID PRN -- script sent.  Recent kidney function stable with GFR 103.  Recommend gentle stretching at  home daily and moderate weight loss.  Return to office as scheduled in April, sooner if any worsening symptoms.      Relevant Medications   gabapentin (NEURONTIN) 100 MG capsule   Other Relevant Orders   Urinalysis, Routine w reflex microscopic     Other   Left upper quadrant abdominal pain    Reports this as ongoing issue for years, wax and wanes.  Suspect related to constipation issues, as improves per her report with Colace.  Will continue Colace at home and obtain labs today to include CMP, Lipase, Amylase, TSH, and UA.  Consider imaging in future if ongoing discomfort.  Recommend continue Colace daily and add on Miralax OTC every morning to aide bowels.  Return as scheduled in April, sooner if worsening.      Relevant Orders   Comprehensive metabolic panel   TSH   Lipase   Amylase       Follow up plan: Return for as scheduled in April.

## 2020-10-03 NOTE — Patient Instructions (Addendum)
[facility] at [facility]  Address: [address] [place], [place], [place] [address]  Phone: [phone]  IMAGING -- [address] [place], [place] [address]   Sciatica  Sciatica is pain, weakness, tingling, or loss of feeling (numbness) along the sciatic nerve. The sciatic nerve starts in the lower back and goes down the back of each leg. Sciatica usually goes away on its own or with treatment. Sometimes, sciatica may come back (recur). What are the causes? This condition happens when the sciatic nerve is pinched or has pressure put on it. This may be the result of:  A disk in between the bones of the spine bulging out too far (herniated disk).  Changes in the spinal disks that occur with aging.  A condition that affects a muscle in the butt.  Extra bone growth near the sciatic nerve.  A break (fracture) of the area between your hip bones (pelvis).  Pregnancy.  Tumor. This is rare. What increases the risk? You are more likely to develop this condition if you:  Play sports that put pressure or stress on the spine.  Have poor strength and ease of movement (flexibility).  Have had a back injury in the past.  Have had back surgery.  Sit for long periods of time.  Do activities that involve bending or lifting over and over again.  Are very overweight (obese). What are the signs or symptoms? Symptoms can vary from mild to very bad. They may include:  Any of these problems in the lower back, leg, hip, or butt: ? Mild tingling, loss of feeling, or dull aches. ? Burning sensations. ? Sharp pains.  Loss of feeling in the back of the calf or the sole of the foot.  Leg weakness.  Very bad back pain that makes it hard to move. These symptoms may get worse when you cough, sneeze, or laugh. They may also get worse when you sit or stand for long periods of time. How is this treated? This condition often gets better without any treatment. However, treatment may  include:  Changing or cutting back on physical activity when you have pain.  Doing exercises and stretching.  Putting ice or heat on the affected area.  Medicines that help: ? To relieve pain and swelling. ? To relax your muscles.  Shots (injections) of medicines that help to relieve pain, irritation, and swelling.  Surgery. Follow these instructions at home: Medicines  Take over-the-counter and prescription medicines only as told by your doctor.  Ask your doctor if the medicine prescribed to you: ? Requires you to avoid driving or using heavy machinery. ? Can cause trouble pooping (constipation). You may need to take these steps to prevent or treat trouble pooping:  Drink enough fluids to keep your pee (urine) pale yellow.  Take over-the-counter or prescription medicines.  Eat foods that are high in fiber. These include beans, whole grains, and fresh fruits and vegetables.  Limit foods that are high in fat and sugar. These include fried or sweet foods. Managing pain  If told, put ice on the affected area. ? Put ice in a plastic bag. ? Place a towel between your skin and the bag. ? Leave the ice on for 20 minutes, 2-3 times a day.  If told, put heat on the affected area. Use the heat source that your doctor tells you to use, such as a moist heat pack or a heating pad. ? Place a towel between your skin and  the heat source. ? Leave the heat on for 20-30 minutes. ? Remove the heat if your skin turns bright red. This is very important if you are unable to feel pain, heat, or cold. You may have a greater risk of getting burned.      Activity  Return to your normal activities as told by your doctor. Ask your doctor what activities are safe for you.  Avoid activities that make your symptoms worse.  Take short rests during the day. ? When you rest for a long time, do some physical activity or stretching between periods of rest. ? Avoid sitting for a long time without  moving. Get up and move around at least one time each hour.  Exercise and stretch regularly, as told by your doctor.  Do not lift anything that is heavier than 10 lb (4.5 kg) while you have symptoms of sciatica. ? Avoid lifting heavy things even when you do not have symptoms. ? Avoid lifting heavy things over and over.  When you lift objects, always lift in a way that is safe for your body. To do this, you should: ? Bend your knees. ? Keep the object close to your body. ? Avoid twisting.   General instructions  Stay at a healthy weight.  Wear comfortable shoes that support your feet. Avoid wearing high heels.  Avoid sleeping on a mattress that is too soft or too hard. You might have less pain if you sleep on a mattress that is firm enough to support your back.  Keep all follow-up visits as told by your doctor. This is important. Contact a doctor if:  You have pain that: ? Wakes you up when you are sleeping. ? Gets worse when you lie down. ? Is worse than the pain you have had in the past. ? Lasts longer than 4 weeks.  You lose weight without trying. Get help right away if:  You cannot control when you pee (urinate) or poop (have a bowel movement).  You have weakness in any of these areas and it gets worse: ? Lower back. ? The area between your hip bones. ? Butt. ? Legs.  You have redness or swelling of your back.  You have a burning feeling when you pee. Summary  Sciatica is pain, weakness, tingling, or loss of feeling (numbness) along the sciatic nerve.  This condition happens when the sciatic nerve is pinched or has pressure put on it.  Sciatica can cause pain, tingling, or loss of feeling (numbness) in the lower back, legs, hips, and butt.  Treatment often includes rest, exercise, medicines, and putting ice or heat on the affected area. This information is not intended to replace advice given to you by your health care provider. Make sure you discuss any  questions you have with your health care provider. Document Revised: 09/04/2018 Document Reviewed: 09/04/2018 Elsevier Patient Education  2021 [employer].

## 2020-10-04 LAB — COMPREHENSIVE METABOLIC PANEL
ALT: 16 IU/L (ref 0–32)
AST: 22 IU/L (ref 0–40)
Albumin/Globulin Ratio: 1.2 (ref 1.2–2.2)
Albumin: 4.5 g/dL (ref 3.7–4.7)
Alkaline Phosphatase: 60 IU/L (ref 44–121)
BUN/Creatinine Ratio: 15 (ref 12–28)
BUN: 11 mg/dL (ref 8–27)
Bilirubin Total: 0.5 mg/dL (ref 0.0–1.2)
CO2: 21 mmol/L (ref 20–29)
Calcium: 11.2 mg/dL — ABNORMAL HIGH (ref 8.7–10.3)
Chloride: 97 mmol/L (ref 96–106)
Creatinine, Ser: 0.75 mg/dL (ref 0.57–1.00)
GFR calc Af Amer: 93 mL/min/{1.73_m2} (ref >59)
GFR calc non Af Amer: 80 mL/min/{1.73_m2} (ref >59)
Globulin, Total: 3.9 g/dL (ref 1.5–4.5)
Glucose: 87 mg/dL (ref 65–99)
Potassium: 4.1 mmol/L (ref 3.5–5.2)
Sodium: 139 mmol/L (ref 134–144)
Total Protein: 8.4 g/dL (ref 6.0–8.5)

## 2020-10-04 LAB — TSH: TSH: 3.96 u[IU]/mL (ref 0.450–4.500)

## 2020-10-04 LAB — AMYLASE: Amylase: 49 U/L (ref 31–110)

## 2020-10-04 LAB — LIPASE: Lipase: 45 U/L (ref 14–85)

## 2020-10-05 NOTE — Progress Notes (Signed)
Good morning, please let Dameka know her labs have returned.  Kidney and liver function testing are all normal, as are pancrease labs.  Calcium is a little elevated, I am going to try to add on one more lab to further check this.  If I am unable to add this on, we will check next visit.  This extra lab looks at a gland called the parathyroid gland.  If any questions let me know. Keep being awesome!!  Thank you for allowing me to participate in your care. Kindest regards, Guila Owensby

## 2020-10-06 NOTE — Addendum Note (Signed)
Addended by: Aura Dials T on: 10/06/2020 09:00 AM   Modules accepted: Orders

## 2020-10-09 ENCOUNTER — Ambulatory Visit
Admission: RE | Admit: 2020-10-09 | Discharge: 2020-10-09 | Disposition: A | Discharge status: Home / Self Care | Payer: Medicare HMO | Source: Ambulatory Visit | Attending: Nurse Practitioner | Admitting: Nurse Practitioner

## 2020-10-09 ENCOUNTER — Ambulatory Visit
Admission: RE | Admit: 2020-10-09 | Discharge: 2020-10-09 | Disposition: A | Discharge status: Home / Self Care | Payer: Medicare HMO | Source: Home / Self Care | Attending: Nurse Practitioner | Admitting: Nurse Practitioner

## 2020-10-09 ENCOUNTER — Other Ambulatory Visit: Payer: Self-pay

## 2020-10-09 DIAGNOSIS — U071 COVID-19: Secondary | ICD-10-CM | POA: Insufficient documentation

## 2020-10-09 DIAGNOSIS — R079 Chest pain, unspecified: Secondary | ICD-10-CM | POA: Diagnosis not present

## 2020-10-09 NOTE — Progress Notes (Signed)
Good morning, please let Ms. Hinsley know her chest x-ray returned negative.  Lungs and heart are stable post Covid.  Great news!! Keep being awesome!!  Thank you for allowing me to participate in your care. Kindest regards, Amberlin Utke

## 2020-10-09 NOTE — Addendum Note (Signed)
Addended by: Aura Dials T on: 10/09/2020 10:25 AM   Modules accepted: Orders

## 2020-10-14 ENCOUNTER — Telehealth: Payer: Medicare HMO

## 2020-10-15 ENCOUNTER — Other Ambulatory Visit: Payer: Self-pay

## 2020-10-15 ENCOUNTER — Telehealth (INDEPENDENT_AMBULATORY_CARE_PROVIDER_SITE_OTHER): Payer: Self-pay | Admitting: Gastroenterology

## 2020-10-15 DIAGNOSIS — Z86010 Personal history of colonic polyps: Secondary | ICD-10-CM

## 2020-10-15 NOTE — Progress Notes (Signed)
Gastroenterology Pre-Procedure Review  Request Date: 03/01 Requesting Physician: Dr. Servando Snare Pt wanted this noted: history of tubal ligation, bladder prolapse PATIENT REVIEW QUESTIONS: The patient responded to the following health history questions as indicated:    1. Are you having any GI issues? yes (occasional constipation will increase water and resume sennakot) 2. Do you have a personal history of Polyps? yes (5 years ago performed by Dr. Servando Snare) 3. Do you have a family history of Colon Cancer or Polyps? no 4. Diabetes Mellitus? no 5. Joint replacements in the past 12 months?no 6. Major health problems in the past 3 months?no 7. Any artificial heart valves, MVP, or defibrillator?no    MEDICATIONS & ALLERGIES:    Patient reports the following regarding taking any anticoagulation/antiplatelet therapy:   Plavix, Coumadin, Eliquis, Xarelto, Lovenox, Pradaxa, Brilinta, or Effient? yes (Plavix blood thinner request to be sent to [facility]) Aspirin? yes (81 mg daily)  Patient confirms/reports the following medications:  Current Outpatient Medications  Medication Sig Dispense Refill  . amLODipine (NORVASC) 10 MG tablet Take 1 tablet (10 mg total) by mouth daily. 90 tablet 4  . aspirin EC 81 MG tablet Take 81 mg by mouth daily.    . cholecalciferol (VITAMIN D3) 25 MCG (1000 UNIT) tablet Take 1,000 Units by mouth daily.    . clopidogrel (PLAVIX) 75 MG tablet Take 1 tablet (75 mg total) by mouth daily. 90 tablet 4  . hydrochlorothiazide (HYDRODIURIL) 25 MG tablet Take 1 tablet (25 mg total) by mouth daily. 90 tablet 4  . isosorbide mononitrate (IMDUR) 60 MG 24 hr tablet Take 1 tablet by mouth once daily 90 tablet 2  . losartan (COZAAR) 100 MG tablet Take 1 tablet (100 mg total) by mouth daily. 90 tablet 4  . metoprolol succinate (TOPROL-XL) 100 MG 24 hr tablet Take 1 tablet (100 mg total) by mouth daily. Take with or immediately following a meal. 90 tablet 3  . nitroGLYCERIN (NITROSTAT) 0.4  MG SL tablet DISSOLVE ONE TABLET UNDER THE TONGUE EVERY 5 MINUTES AS NEEDED FOR CHEST PAIN.  DO NOT EXCEED A TOTAL OF 3 DOSES IN 15 MINUTES 25 tablet 1  . potassium chloride SA (KLOR-CON) 20 MEQ tablet Take 1 tablet (20 mEq total) by mouth daily. 90 tablet 1  . rosuvastatin (CRESTOR) 40 MG tablet Take 1 tablet (40 mg total) by mouth daily. 90 tablet 3  . spironolactone (ALDACTONE) 25 MG tablet Take 0.5 tablets (12.5 mg total) by mouth daily. 45 tablet 3  . UNABLE TO FIND Neo-Cell    . gabapentin (NEURONTIN) 100 MG capsule Take 1 capsule (100 mg total) by mouth 3 (three) times daily as needed (for burning leg pain.). (Patient not taking: Reported on 10/15/2020) 90 capsule 3  . sucralfate (CARAFATE) 1 g tablet Take 1 tablet (1 g total) by mouth 4 (four) times daily -  with meals and at bedtime. (Patient not taking: Reported on 10/15/2020) 120 tablet 2   No current facility-administered medications for this visit.    Patient confirms/reports the following allergies:  Allergies  Allergen Reactions  . Brilinta [Ticagrelor] Shortness Of Breath  . Lipitor [Atorvastatin] Itching    Mouth itching, cough  . Latex Other (See Comments)    Gloves make hands look black after prolonged use  . Penicillins Itching and Swelling    Tongue itching and lip swelling Has patient had a PCN reaction causing immediate rash, facial/tongue/throat swelling, SOB or lightheadedness with hypotension: yes Has patient had a PCN reaction causing  severe rash involving mucus membranes or skin necrosis: No Has patient had a PCN reaction that required hospitalization No Has patient had a PCN reaction occurring within the last 10 years: No If all of the above answers are "NO", then may proceed with Cephalosporin use.  Danie Chandler [Aliskiren] Rash    No orders of the defined types were placed in this encounter.   AUTHORIZATION INFORMATION Primary Insurance: 1D#: Group #:  Secondary Insurance: 1D#: Group #:  SCHEDULE  INFORMATION: Date: 10/28/20 Time: Location:[facility]

## 2020-10-15 NOTE — Progress Notes (Signed)
Gastroenterology Pre-Procedure Review  Request Date: 10/28/20 Requesting Physician: Dr. Servando Snare  PATIENT REVIEW QUESTIONS: The patient responded to the following health history questions as indicated:    1. Are you having any GI issues? occasional constipation. will resume taking sennakot, and increase water intake 2. Do you have a personal history of Polyps? yes (Pt states her last colonoscopy was 5 years ago with Dr. Servando Snare) 3. Do you have a family history of Colon Cancer or Polyps? no 4. Diabetes Mellitus? no 5. Joint replacements in the past 12 months?no 6. Major health problems in the past 3 months?no 7. Any artificial heart valves, MVP, or defibrillator?no    MEDICATIONS & ALLERGIES:    Patient reports the following regarding taking any anticoagulation/antiplatelet therapy:   Plavix, Coumadin, Eliquis, Xarelto, Lovenox, Pradaxa, Brilinta, or Effient? yes (Plavix-blood thinner to be sent to [facility]) Aspirin? yes (81 mg daily)  Patient confirms/reports the following medications:  Current Outpatient Medications  Medication Sig Dispense Refill  . amLODipine (NORVASC) 10 MG tablet Take 1 tablet (10 mg total) by mouth daily. 90 tablet 4  . aspirin EC 81 MG tablet Take 81 mg by mouth daily.    . cholecalciferol (VITAMIN D3) 25 MCG (1000 UNIT) tablet Take 1,000 Units by mouth daily.    . clopidogrel (PLAVIX) 75 MG tablet Take 1 tablet (75 mg total) by mouth daily. 90 tablet 4  . gabapentin (NEURONTIN) 100 MG capsule Take 1 capsule (100 mg total) by mouth 3 (three) times daily as needed (for burning leg pain.). (Patient not taking: Reported on 10/15/2020) 90 capsule 3  . hydrochlorothiazide (HYDRODIURIL) 25 MG tablet Take 1 tablet (25 mg total) by mouth daily. 90 tablet 4  . isosorbide mononitrate (IMDUR) 60 MG 24 hr tablet Take 1 tablet by mouth once daily 90 tablet 2  . losartan (COZAAR) 100 MG tablet Take 1 tablet (100 mg total) by mouth daily. 90 tablet 4  . metoprolol succinate  (TOPROL-XL) 100 MG 24 hr tablet Take 1 tablet (100 mg total) by mouth daily. Take with or immediately following a meal. 90 tablet 3  . nitroGLYCERIN (NITROSTAT) 0.4 MG SL tablet DISSOLVE ONE TABLET UNDER THE TONGUE EVERY 5 MINUTES AS NEEDED FOR CHEST PAIN.  DO NOT EXCEED A TOTAL OF 3 DOSES IN 15 MINUTES 25 tablet 1  . potassium chloride SA (KLOR-CON) 20 MEQ tablet Take 1 tablet (20 mEq total) by mouth daily. 90 tablet 1  . rosuvastatin (CRESTOR) 40 MG tablet Take 1 tablet (40 mg total) by mouth daily. 90 tablet 3  . spironolactone (ALDACTONE) 25 MG tablet Take 0.5 tablets (12.5 mg total) by mouth daily. 45 tablet 3  . sucralfate (CARAFATE) 1 g tablet Take 1 tablet (1 g total) by mouth 4 (four) times daily -  with meals and at bedtime. (Patient not taking: Reported on 10/15/2020) 120 tablet 2  . UNABLE TO FIND Neo-Cell     No current facility-administered medications for this visit.    Patient confirms/reports the following allergies:  Allergies  Allergen Reactions  . Brilinta [Ticagrelor] Shortness Of Breath  . Lipitor [Atorvastatin] Itching    Mouth itching, cough  . Latex Other (See Comments)    Gloves make hands look black after prolonged use  . Penicillins Itching and Swelling    Tongue itching and lip swelling Has patient had a PCN reaction causing immediate rash, facial/tongue/throat swelling, SOB or lightheadedness with hypotension: yes Has patient had a PCN reaction causing severe rash involving mucus membranes  or skin necrosis: No Has patient had a PCN reaction that required hospitalization No Has patient had a PCN reaction occurring within the last 10 years: No If all of the above answers are "NO", then may proceed with Cephalosporin use.  Danie Chandler [Aliskiren] Rash    Orders Placed This Encounter  Procedures  . Procedural/ Surgical Case Request: COLONOSCOPY WITH PROPOFOL    Standing Status:   Standing    Number of Occurrences:   1    Order Specific Question:   Pre-op  diagnosis    Answer:   personal history of colon polyps    Order Specific Question:   CPT Code    Answer:   20233    AUTHORIZATION INFORMATION Primary Insurance: 1D#: Group #:  Secondary Insurance: 1D#: Group #:  SCHEDULE INFORMATION: Date: 10/28/20 Time: Location:[facility]

## 2020-10-16 ENCOUNTER — Ambulatory Visit (INDEPENDENT_AMBULATORY_CARE_PROVIDER_SITE_OTHER): Payer: Medicare HMO | Admitting: Obstetrics and Gynecology

## 2020-10-16 ENCOUNTER — Encounter: Payer: Self-pay | Admitting: Obstetrics and Gynecology

## 2020-10-16 ENCOUNTER — Other Ambulatory Visit: Payer: Self-pay

## 2020-10-16 VITALS — BP 144/95 | HR 84 | Ht 61.69 in | Wt 229.6 lb

## 2020-10-16 DIAGNOSIS — R3912 Poor urinary stream: Secondary | ICD-10-CM

## 2020-10-16 DIAGNOSIS — N812 Incomplete uterovaginal prolapse: Secondary | ICD-10-CM | POA: Diagnosis not present

## 2020-10-16 NOTE — Progress Notes (Signed)
Pt present to discuss surgery. Pt stated that she was doing well.

## 2020-10-16 NOTE — Progress Notes (Signed)
    GYNECOLOGY PROGRESS NOTE  Subjective:    Patient ID: Jennifer Grant, female    DOB: [birth year], 70 y.o.   MRN: [id number]  HPI  Patient is a 70 y.o. [id number] female who presents for further discussion of surgery and pre-operative examination. She currently has a cystocele with rectocele and incomplete uterine prolapse.  She is having issues with her urinary stream. Patient reports that she and her family reviewed the handouts given to her last visit. She states that her daughter does not want her to have the surgery so would like to discuss an alternative (pessary).  She notes that if this does not work, she will proceed with surgery after this.   The following portions of the patient's history were reviewed and updated as appropriate: allergies, current medications, past family history, past medical history, past social history, past surgical history and problem list.  Review of Systems Pertinent items noted in HPI and remainder of comprehensive ROS otherwise negative.   Objective:   Height 5' 1.69" (1.567 m), weight 229 lb 9.6 oz (104.1 kg). General appearance: alert and no distress Abdomen: soft, non-tender; bowel sounds normal; no masses,  no organomegaly Pelvic:   VULVA: normal appearing vulva with no masses, tenderness or lesions             VAGINA: normal appearing vagina with no discharge, no lesions, atrophic. Grade 3 cystocele, with Grade 1-2 rectocele and incomplete uterine prolapse (Graade 2)             CERVIX: normal appearing cervix without discharge or lesions             UTERUS: uterus is normal size, shape, consistency and nontender, ADNEXA: normal adnexa in size, nontender and no masses             RECTAL: normal rectal, no masses, rectal exam not indicated. Extremities: extremities normal, atraumatic, no cyanosis or edema Neurologic: Grossly normal  Extremities: extremities normal, atraumatic, no cyanosis or edema Neurologic: Grossly normal   Assessment:   1.  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 Patient ok to try pessary. Pessary fitting performed today.  Size 3 ring with support ordered.  - RTC in 2-3 weeks for pessary insertion.    Hildred Laser, MD [facility]

## 2020-10-24 ENCOUNTER — Other Ambulatory Visit
Admission: RE | Admit: 2020-10-24 | Discharge: 2020-10-24 | Disposition: A | Discharge status: Home / Self Care | Payer: Medicare HMO | Source: Ambulatory Visit | Attending: Nurse Practitioner | Admitting: Nurse Practitioner

## 2020-10-24 NOTE — Progress Notes (Signed)
Arrived at the covid testing site. Stated that she had tested positive in January 2022. Evidence of this positive covid is from a note in Epic dated on 09/04/2020 and copy/pasted below. Per policy, the patient does not need a repeat covid test prior to upcoming surgery.  Assessment and Plan:  COVID-19 Positive at health dept. Overall doing well with mild sx, improving. Reviewed OTC symptom relief, self-quarantine guidelines, and emergency precautions. Not a candidate for MAB tx as 7 days from symptom onset.

## 2020-10-27 ENCOUNTER — Telehealth: Payer: Self-pay

## 2020-10-27 NOTE — Telephone Encounter (Signed)
Patient has been informed that she will need to reschedule her Colonoscopy that was scheduled for tomorrow due to positive COVID testing on 09/03/20 .    Contacted trish in Endo to let her know about the reschedule date, and she advised that pt will need to wait 90 days before testing again since she tested positive on 09/03/20.  Patient has been asked to call the office back in April to have her colonoscopy rescheduled.  Thanks,  [place], [place]

## 2020-10-27 NOTE — Telephone Encounter (Signed)
Copied from CRM [id number]. Topic: General - Other >> Oct 27, 2020  1:58 PM Gaetana Michaelis A wrote: Reason for CRM: Patient would like to be contacted regarding their medication instructions Patient has a procedure scheduled for tomorrow 10/28/20 at 9and is uncertain of when they are supposed to take their medication Please contact to advise

## 2020-10-27 NOTE — Telephone Encounter (Signed)
Spoke with patient and provided her with GI contact information as she had a question regarding her medication prior to her procedure. Patient verbalized understanding and had no further questions.

## 2020-10-28 ENCOUNTER — Encounter: Admission: RE | Payer: Self-pay | Source: Home / Self Care

## 2020-10-28 ENCOUNTER — Ambulatory Visit: Admission: RE | Admit: 2020-10-28 | Payer: Medicare HMO | Source: Home / Self Care | Admitting: Gastroenterology

## 2020-10-28 SURGERY — COLONOSCOPY WITH PROPOFOL
Anesthesia: General

## 2020-11-04 ENCOUNTER — Encounter: Payer: Self-pay | Admitting: Obstetrics and Gynecology

## 2020-11-04 ENCOUNTER — Ambulatory Visit (INDEPENDENT_AMBULATORY_CARE_PROVIDER_SITE_OTHER): Payer: Medicare HMO | Admitting: Obstetrics and Gynecology

## 2020-11-04 ENCOUNTER — Other Ambulatory Visit: Payer: Self-pay

## 2020-11-04 VITALS — BP 75/55 | HR 91 | Ht 61.69 in | Wt 230.9 lb

## 2020-11-04 DIAGNOSIS — N812 Incomplete uterovaginal prolapse: Secondary | ICD-10-CM | POA: Diagnosis not present

## 2020-11-04 NOTE — Progress Notes (Signed)
Pt present for pessary insertion. Pt c/o side rib pains after eating.

## 2020-11-04 NOTE — Progress Notes (Signed)
    GYNECOLOGY PROGRESS NOTE  Subjective:    Patient ID: Jennifer Grant, female    DOB: 11-Dec-1950, 70 y.o.   MRN: [id number]  HPI  Patient is a 70 y.o. [id number] female who presents for pessary insertion.  She has a history of cystocele with rectocele and incomplete uterine prolapse.  Denies major complaints today.   The following portions of the patient's history were reviewed and updated as appropriate: allergies, current medications, past family history, past medical history, past social history, past surgical history and problem list.  Review of Systems Pertinent items noted in HPI and remainder of comprehensive ROS otherwise negative.   Objective:   Blood pressure (!) 75/55, pulse 91, height 5' 1.69" (1.567 m), weight 230 lb 14.4 oz (104.7 kg). General appearance: alert and no distress Pelvic:              VULVA: normal appearing vulva with no masses, tenderness or lesions VAGINA:normal appearing vagina withnodischarge, no lesions, atrophic. Grade 3 cystocele, with Grade 1-2 rectocele and incomplete uterine prolapse (Graade 2) CERVIX: normal appearing cervix without discharge or lesions UTERUS: uterus is normal size, shape, consistency and nontender, ADNEXA:normal adnexa in size, nontender and no masses    Assessment:   1. 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 To f/u in 2-3 weeks for reassessment of symptoms  - Advised on use of Trimosan gel once weekly.    Hildred Laser, MD [facility]

## 2020-11-05 ENCOUNTER — Encounter: Payer: Self-pay | Admitting: Obstetrics and Gynecology

## 2020-11-05 NOTE — Patient Instructions (Signed)
Pelvic Organ Prolapse Pelvic organ prolapse is a condition in women that involves the stretching, bulging, or dropping of pelvic organs into an abnormal position, past the opening of the vagina. It happens when the muscles and tissues that surround and support pelvic structures become weak or stretched. Pelvic organ prolapse can involve the:  Vagina (vaginal prolapse).  Uterus (uterine prolapse).  Bladder (cystocele).  Rectum (rectocele).  Intestines (enterocele). When organs other than the vagina are involved, they often bulge into the vagina or protrude from the vagina, depending on how severe the prolapse is. What are the causes? This condition may be caused by:  Pregnancy, labor, and childbirth.  Past pelvic surgery.  Lower levels of the hormone estrogen due to menopause.  Consistently lifting more than 50 lb (23 kg).  Obesity.  Long-term difficulty passing stool (chronic constipation).  Long-term, or chronic, cough.  Fluid buildup in the abdomen due to certain conditions. What are the signs or symptoms? Symptoms of this condition include:  Leaking a little urine (loss of bladder control) when you cough, sneeze, strain, and exercise (stress incontinence). This may be worse immediately after childbirth. It may gradually improve over time.  Feeling pressure in your pelvis or vagina. This pressure may increase when you cough or when you are passing stool.  A bulge that protrudes from the opening of your vagina.  Difficulty passing urine or stool.  Pain in your lower back.  Pain or discomfort during sex, or decreased interest in sex.  Repeated bladder infections (urinary tract infections).  Difficulty inserting a tampon. In some people, this condition causes no symptoms. How is this diagnosed? This condition may be diagnosed based on a vaginal and rectal exam. During the exam, you may be asked to cough and strain while you are lying down, sitting, and standing up.  Your health care provider will determine if other tests are required, such as bladder function tests. How is this treated? Treatment for this condition may depend on your symptoms. Treatment may include:  Lifestyle changes, such as drinking plenty of fluids and eating foods that are high in fiber.  Emptying your bladder at scheduled times (bladder training therapy). This can help reduce or avoid urinary incontinence.  Estrogen. This may help mild prolapse by increasing the strength and tone of pelvic floor muscles.  Kegel exercises. These may help mild cases of prolapse by strengthening and tightening the muscles of the pelvic floor.  A soft, flexible device that helps support the vaginal walls and keep pelvic organs in place (pessary). This is inserted into your vagina by your health care provider.  Surgery. This is often the only form of treatment for severe prolapse. Follow these instructions at home: Eating and drinking  Avoid drinking beverages that contain caffeine or alcohol.  Increase your intake of high-fiber foods to decrease constipation and straining during bowel movements. Activity  Lose weight if recommended by your health care provider.  Avoid heavy lifting and straining with exercise and work. Do not hold your breath when you perform mild to moderate lifting and exercise activities. Limit your activities as directed by your health care provider.  Do Kegel exercises as directed by your health care provider. To do this: ? Squeeze your pelvic floor muscles tight. You should feel a tight lift in your rectal area and a tightness in your vaginal area. Keep your stomach, buttocks, and legs relaxed. ? Hold the muscles tight for up to 10 seconds. Then relax your muscles. ? Repeat this exercise   50 times a day, or as much as told by your health care provider. Continue to do this exercise for at least 4-6 weeks, or for as long as told by your health care provider. General  instructions  Take over-the-counter and prescription medicines only as told by your health care provider.  Wear a sanitary pad or adult diapers if you have urinary incontinence.  If you have a pessary, take care of it as told by your health care provider.  Keep all follow-up visits. This is important. Contact a health care provider if you:  Have symptoms that interfere with your daily activities or sex life.  Need medicine to help with the discomfort.  Notice bleeding from your vagina that is not related to your menstrual period.  Have a fever.  Have pain or bleeding when you urinate.  Have bleeding when you pass stool.  Pass urine when you have sex.  Have chronic constipation.  Have a pessary that falls out.  Have a foul-smelling vaginal discharge.  Have an unusual, low pain in your abdomen. Get help right away if you:  Cannot pass urine. Summary  Pelvic organ prolapse is the stretching, bulging, or dropping of pelvic organs into an abnormal position. It happens when the muscles and tissues that surround and support pelvic structures become weak or stretched.  When organs other than the vagina are involved, they often bulge into the vagina or protrude from it, depending on how severe the prolapse is.  In most cases, this condition needs to be treated only if it produces symptoms. Treatment may include lifestyle changes, estrogen, Kegel exercises, pessary insertion, or surgery.  Avoid heavy lifting and straining with exercise and work. Do not hold your breath when you perform mild to moderate lifting and exercise activities. Limit your activities as directed by your health care provider. This information is not intended to replace advice given to you by your health care provider. Make sure you discuss any questions you have with your health care provider. Document Revised: 02/11/2020 Document Reviewed: 02/11/2020 Elsevier Patient Education  2021 [employer] Inc[employer]  

## 2020-11-25 ENCOUNTER — Ambulatory Visit (INDEPENDENT_AMBULATORY_CARE_PROVIDER_SITE_OTHER): Payer: Medicare HMO | Admitting: Obstetrics and Gynecology

## 2020-11-25 ENCOUNTER — Other Ambulatory Visit: Payer: Self-pay

## 2020-11-25 ENCOUNTER — Encounter: Payer: Self-pay | Admitting: Obstetrics and Gynecology

## 2020-11-25 VITALS — BP 175/107 | HR 68 | Ht 61.69 in | Wt 230.1 lb

## 2020-11-25 DIAGNOSIS — N812 Incomplete uterovaginal prolapse: Secondary | ICD-10-CM

## 2020-11-25 DIAGNOSIS — N898 Other specified noninflammatory disorders of vagina: Secondary | ICD-10-CM | POA: Diagnosis not present

## 2020-11-25 NOTE — Progress Notes (Signed)
    GYNECOLOGY PROGRESS NOTE  Subjective:    Patient ID: Jennifer Grant, female    DOB: 08-10-1951, 70 y.o.   MRN: [id number]  HPI  Patient is a 71 y.o. [id number] female who presents for 2 week pessary check.  She has a history of cystocele with rectocele and incomplete uterine prolapse.  Patient reports that the pessary in place she did experience some vaginal pain and discomfort for the first 3 to 4 days, however this has resolved.  She also reports that the pelvic pain that she has been feeling for years on her left side has now resolved since use of the pessary.  She does complain of concerns that sometimes she can feel the tip of the pessary like it is going to come out, but she will push it back in. She reports no vaginal bleeding or discharge. She denies pelvic discomfort and difficulty urinating or moving her bowels.  She does sometimes report noting a vaginal odor.  Uses the Trimo-San gel as prescribed, and also reports that she will wipe times with peroxide. ?   The following portions of the patient's history were reviewed and updated as appropriate: allergies, current medications, past family history, past medical history, past social history, past surgical history and problem list.  Review of Systems Pertinent items noted in HPI and remainder of comprehensive ROS otherwise negative.   Objective:   Blood pressure (!) 175/107, pulse 68, height 5' 1.69" (1.567 m), weight 230 lb 1.6 oz (104.4 kg). General appearance: alert and no distress Abdomen: soft, non-tender; bowel sounds normal; no masses,  no organomegaly Pelvic: The patient's Size 3 ring with support pessary was removed, cleaned and replaced without complications. Speculum examination revealed normal vaginal mucosa with no lesions or lacerations.  No vaginal discharge noted.   Assessment:   1. 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 Vaginal odor     Plan:   - The patient should return in 3  months for a pessary check and continue to use Trimo-San gel weekly as prescribed vaginal odor, although no vaginal discharge or odor was noted on today's exam.   Hildred Laser, MD [facility]

## 2020-11-25 NOTE — Progress Notes (Signed)
Pt present for follow up for pessary check and reassessment of symptoms. Pt stated that she has noticed some pain in left side. Pt's bp was elevated pt stated that she did not take her bp medication today. Pt was advised to please take her medication has prescribed.

## 2020-12-01 ENCOUNTER — Encounter: Payer: Self-pay | Admitting: Gastroenterology

## 2020-12-04 ENCOUNTER — Other Ambulatory Visit: Payer: Self-pay | Admitting: Nurse Practitioner

## 2020-12-04 MED ORDER — ISOSORBIDE MONONITRATE ER 60 MG PO TB24: 60.0000 mg | ORAL_TABLET | Freq: Every day | ORAL | 2 refills | Status: DC

## 2020-12-04 NOTE — Telephone Encounter (Signed)
Requested Prescriptions  Pending Prescriptions Disp Refills  . isosorbide mononitrate (IMDUR) 60 MG 24 hr tablet 90 tablet 2    Sig: Take 1 tablet (60 mg total) by mouth daily.     Cardiovascular:  Nitrates Failed - 12/04/2020  1:05 PM      Failed - Last BP in normal range    BP Readings from Last 1 Encounters:  11/25/20 (!) 175/107         Passed - Last Heart Rate in normal range    Pulse Readings from Last 1 Encounters:  11/25/20 68         Passed - Valid encounter within last 12 months    Recent Outpatient Visits          2 months ago Chronic bilateral low back pain with right-sided sciatica   [facility] [place], Dorie Rank, NP   2 months ago Laryngitis   [facility] [place], [place], DO   3 months ago COVID-19   [facility] [place], DO   5 months ago Chronic diastolic heart failure (HCC)   [facility] [place], Corrie Dandy T, NP   10 months ago Morbid obesity [facility])   [facility] [place], Dorie Rank, NP      Future Appointments            In 5 days Marjie Skiff, NP [employer], [facility]   In 1 week Katrinka Blazing, Barry Dienes, MD [facility] [employer], [facility]   In 4 months  [facility], [place] [facility]

## 2020-12-04 NOTE — Telephone Encounter (Signed)
Medication: isosorbide mononitrate (IMDUR) 60 MG 24 hr tablet [[id number]]   Has the patient contacted their pharmacy? YES (Agent: If no, request that the patient contact the pharmacy for the refill.) (Agent: If yes, when and what did the pharmacy advise?)  Preferred Pharmacy (with phone number or street name): [facility] [address]), [place] - [address]) [place] [address] Phone: [phone] Fax: [phone] Hours: Not open 24 hours    Agent: Please be advised that RX refills may take up to 3 business days. We ask that you follow-up with your pharmacy.

## 2020-12-09 ENCOUNTER — Other Ambulatory Visit: Payer: Self-pay

## 2020-12-09 ENCOUNTER — Encounter: Payer: Self-pay | Admitting: Nurse Practitioner

## 2020-12-09 ENCOUNTER — Ambulatory Visit (INDEPENDENT_AMBULATORY_CARE_PROVIDER_SITE_OTHER): Payer: Medicare HMO | Admitting: Nurse Practitioner

## 2020-12-09 VITALS — BP 138/78 | HR 75 | Temp 98.8°F | Wt 229.4 lb

## 2020-12-09 DIAGNOSIS — Z9114 Patient's other noncompliance with medication regimen: Secondary | ICD-10-CM

## 2020-12-09 DIAGNOSIS — R7309 Other abnormal glucose: Secondary | ICD-10-CM

## 2020-12-09 DIAGNOSIS — E782 Mixed hyperlipidemia: Secondary | ICD-10-CM | POA: Diagnosis not present

## 2020-12-09 DIAGNOSIS — I25118 Atherosclerotic heart disease of native coronary artery with other forms of angina pectoris: Secondary | ICD-10-CM

## 2020-12-09 DIAGNOSIS — I252 Old myocardial infarction: Secondary | ICD-10-CM | POA: Diagnosis not present

## 2020-12-09 DIAGNOSIS — R1012 Left upper quadrant pain: Secondary | ICD-10-CM | POA: Diagnosis not present

## 2020-12-09 DIAGNOSIS — I10 Essential (primary) hypertension: Secondary | ICD-10-CM

## 2020-12-09 DIAGNOSIS — Z6841 Body Mass Index (BMI) 40.0 and over, adult: Secondary | ICD-10-CM | POA: Diagnosis not present

## 2020-12-09 DIAGNOSIS — I5032 Chronic diastolic (congestive) heart failure: Secondary | ICD-10-CM | POA: Diagnosis not present

## 2020-12-09 DIAGNOSIS — E538 Deficiency of other specified B group vitamins: Secondary | ICD-10-CM | POA: Diagnosis not present

## 2020-12-09 DIAGNOSIS — E559 Vitamin D deficiency, unspecified: Secondary | ICD-10-CM

## 2020-12-09 DIAGNOSIS — Z91148 Patient's other noncompliance with medication regimen for other reason: Secondary | ICD-10-CM

## 2020-12-09 DIAGNOSIS — N8110 Cystocele, unspecified: Secondary | ICD-10-CM

## 2020-12-09 NOTE — Assessment & Plan Note (Signed)
Reports this as ongoing issue for years, wax and wanes.  Suspect related to constipation issues, as improves per her report with Miralax use.  Will continue Miralax at home and obtain labs today to include CMP, Lipase, Amylase, TSH.  Consider imaging in future if ongoing discomfort.  Reschedule colonscopy.  Return if worsening or ongoing symptoms, then would obtain imaging.

## 2020-12-09 NOTE — Assessment & Plan Note (Signed)
Ongoing issue, will continue to involve CCM team to assist. 

## 2020-12-09 NOTE — Assessment & Plan Note (Signed)
Recommend continued focus on healthy diet choices and regular physical activity (30 minutes 5 days a week).  Has poor diet at home, discussed at length with her.

## 2020-12-09 NOTE — Assessment & Plan Note (Signed)
BMI 42.38 with HTN, OLD MI, HLD. Recommended eating smaller high protein, low fat meals more frequently and exercising 30 mins a day 5 times a week with a goal of 10-15lb weight loss in the next 3 months. Patient voiced their understanding and motivation to adhere to these recommendations.

## 2020-12-09 NOTE — Assessment & Plan Note (Signed)
Chronic, ongoing.  Continue current medication regimen and adjust as needed.  Lipid panel today.  Have recommended she take this daily to help with prevention.  Discussed this at length with patient. 

## 2020-12-09 NOTE — Assessment & Plan Note (Signed)
Ongoing, recommend continue daily supplement and adjust as needed.  Recheck level today.

## 2020-12-09 NOTE — Assessment & Plan Note (Signed)
Recheck A1C today and initiate medication if indicated.  Discussed at length diet choices.

## 2020-12-09 NOTE — Assessment & Plan Note (Signed)
Chronic, ongoing.  Continue current medication regimen and collaboration with cardiology.  Discussed diet choices and to cut back on sodium intake.   - Reminded to call for an overnight weight gain of >2 pounds or a weekly weight weight of >5 pounds - not adding salt to his food and has been reading food labels. Reviewed the importance of keeping daily sodium intake to 2000mg  daily

## 2020-12-09 NOTE — Progress Notes (Signed)
BP 138/78   Pulse 75   Temp 98.8 F (37.1 C) (Oral)   Wt 229 lb 6.4 oz (104.1 kg)   LMP  (LMP Unknown)   SpO2 100%   BMI 42.38 kg/m    Subjective:    Patient ID: Jennifer Grant, female    DOB: 1950-09-18, 70 y.o.   MRN: [id number]  HPI: Jennifer Grant is a 70 y.o. female  Chief Complaint  Patient presents with  . Hyperlipidemia  . Hypertension  . Prediabetes  . Congestive Heart Failure   HYPERTENSION / HYPERLIPIDEMIA/HF Currently taking Losartan, Amlodipine, HCTZ, Isosorbide Mono, Plavix, and ASA + Lovastatin. Last saw cardiology on 06/09/2020, is to see on Friday.  She had sleep study noting OSA. On review of notes appears non adherence continues -- she does endorse missing doses.     2021 Echo 50-55%, prior MI, calcified aortic valve causing murmur but no stenosis and thickened LV, chronic diastolic HF diagnosis on chart.Diet is a barrier for her, she does endorse enjoying foods higher in sodium. No recent NTG use.  History of MI in 2004.   Satisfied with current treatment?yes Duration of hypertension:chronic BP monitoring frequency:rarely BP range: 132/88 BP medication side effects:no Duration of hyperlipidemia:chronic Cholesterol medication side effects:no Cholesterol supplements: none Medication compliance:good compliance Aspirin:yes Recent stressors:no Recurrent headaches:no Visual changes:no Palpitations:no Dyspnea:none Chest pain:no Lower extremity edema:no Dizzy/lightheaded:no The ASCVD Risk score Denman George DC Jr., et al., 2013) failed to calculate for the following reasons: The patient has a prior MI or stroke diagnosis  ELEVATED A1C: Most recent A1C  5.7% October.She does endorse eating lots of Jamaican sweet rolls over past months and poor diet choices. Polydipsia/polyuria:no Visual disturbance:no Chest pain:no Paresthesias:no  VITAMIN D DEFICIENCY: On recent labs her Vit D was 40.7 and continues daily supplement.   She denies any muscle aches, fatigue, recent falls or fractures.   Last visit was noted to have mild elevation in calcium -- 11.6.  ABDOMINAL PAIN  Has been present on and off since previous PCP cared for her.  To LUQ on and off.  Reports she has bladder issues from having babies, recently had pessary placed by Dr. Valentino Saxon, last visit 11/25/20.  States she needs a good stool softener -- at this time is taking Miralax.  She reports her bowels are passing better with this.    She was to go for colonoscopy -- but this was cancelled -- is rescheduling.   Duration:months Onset: ongoing Severity: mild Quality: dull and aching, cramping Location: LUQ Episode duration: minutes Radiation: yes Frequency: intermittent Alleviating factors:  Aggravating factors: Status: fluctuating Treatments attempted: stool softeners Fever: no Nausea: no Vomiting: no Weight loss: no Decreased appetite: no Diarrhea: no Constipation: yes Blood in stool: no Heartburn: no Jaundice: no Rash: no Dysuria/urinary frequency: no Hematuria: no History of sexually transmitted disease: no Recurrent NSAID use: no  She is requesting referral back to Dr. Valentino Saxon today to discuss her past bladder issues and possible surgical intervention which she refused in past.  Relevant past medical, surgical, family and social history reviewed and updated as indicated. Interim medical history since our last visit reviewed. Allergies and medications reviewed and updated.  Review of Systems  Constitutional: Negative for activity change, appetite change, diaphoresis, fatigue and fever.  Respiratory: Negative for cough, chest tightness and shortness of breath.   Cardiovascular: Negative for chest pain, palpitations and leg swelling.  Gastrointestinal: Negative.   Endocrine: Negative for polydipsia, polyphagia and polyuria.  Neurological: Negative.  Psychiatric/Behavioral: Negative.     Per HPI unless specifically indicated  above     Objective:    BP 138/78   Pulse 75   Temp 98.8 F (37.1 C) (Oral)   Wt 229 lb 6.4 oz (104.1 kg)   LMP  (LMP Unknown)   SpO2 100%   BMI 42.38 kg/m   Wt Readings from Last 3 Encounters:  12/09/20 229 lb 6.4 oz (104.1 kg)  11/25/20 230 lb 1.6 oz (104.4 kg)  11/04/20 230 lb 14.4 oz (104.7 kg)    Physical Exam Vitals and nursing note reviewed.  Constitutional:      General: She is awake. She is not in acute distress.    Appearance: She is well-developed and well-groomed. She is morbidly obese. She is not ill-appearing.  HENT:     Head: Normocephalic.     Right Ear: Hearing normal.     Left Ear: Hearing normal.  Eyes:     General: Lids are normal.        Right eye: No discharge.        Left eye: No discharge.     Conjunctiva/sclera: Conjunctivae normal.     Pupils: Pupils are equal, round, and reactive to light.  Neck:     Thyroid: No thyromegaly.     Vascular: No carotid bruit.  Cardiovascular:     Rate and Rhythm: Normal rate and regular rhythm.     Heart sounds: Normal heart sounds. No murmur heard. No gallop.   Pulmonary:     Effort: Pulmonary effort is normal. No accessory muscle usage or respiratory distress.     Breath sounds: Normal breath sounds.  Abdominal:     General: Bowel sounds are normal.     Palpations: Abdomen is soft.     Tenderness: There is no abdominal tenderness.  Musculoskeletal:     Cervical back: Normal range of motion and neck supple.     Right lower leg: Edema (trace) present.     Left lower leg: Edema (trace) present.  Skin:    General: Skin is warm and dry.  Neurological:     Mental Status: She is alert and oriented to person, place, and time.  Psychiatric:        Attention and Perception: Attention normal.        Mood and Affect: Mood normal.        Speech: Speech normal.        Behavior: Behavior normal. Behavior is cooperative.        Thought Content: Thought content normal.    Results for orders placed or performed  in visit on 10/03/20  Microscopic Examination   Urine  Result Value Ref Range   WBC, UA 0-5 0 - 5 /hpf   RBC None seen 0 - 2 /hpf   Epithelial Cells (non renal) 0-10 0 - 10 /hpf   Renal Epithel, UA None seen None seen /hpf   Casts None seen None seen /lpf   Cast Type None seen N/A   Crystals None seen N/A   Crystal Type None seen N/A   Mucus, UA None seen Not Estab.   Bacteria, UA Few None seen/Few   Yeast, UA None seen None seen   Trichomonas, UA None seen None seen  Comprehensive metabolic panel  Result Value Ref Range   Glucose 87 65 - 99 mg/dL   BUN 11 8 - 27 mg/dL   Creatinine, Ser 9.52 0.57 - 1.00 mg/dL   GFR calc non  Af Amer 80 >59 mL/min/1.73   GFR calc Af Amer 93 >59 mL/min/1.73   BUN/Creatinine Ratio 15 12 - 28   Sodium 139 134 - 144 mmol/L   Potassium 4.1 3.5 - 5.2 mmol/L   Chloride 97 96 - 106 mmol/L   CO2 21 20 - 29 mmol/L   Calcium 11.2 (H) 8.7 - 10.3 mg/dL   Total Protein 8.4 6.0 - 8.5 g/dL   Albumin 4.5 3.7 - 4.7 g/dL   Globulin, Total 3.9 1.5 - 4.5 g/dL   Albumin/Globulin Ratio 1.2 1.2 - 2.2   Bilirubin Total 0.5 0.0 - 1.2 mg/dL   Alkaline Phosphatase 60 44 - 121 IU/L   AST 22 0 - 40 IU/L   ALT 16 0 - 32 IU/L  TSH  Result Value Ref Range   TSH 3.960 0.450 - 4.500 uIU/mL  Lipase  Result Value Ref Range   Lipase 45 14 - 85 U/L  Amylase  Result Value Ref Range   Amylase 49 31 - 110 U/L  Urinalysis, Routine w reflex microscopic  Result Value Ref Range   Specific Gravity, UA 1.015 1.005 - 1.030   pH, UA 7.5 5.0 - 7.5   Color, UA Yellow Yellow   Appearance Ur Clear Clear   Leukocytes,UA Trace (A) Negative   Protein,UA Negative Negative/Trace   Glucose, UA Negative Negative   Ketones, UA Negative Negative   RBC, UA Negative Negative   Bilirubin, UA Negative Negative   Urobilinogen, Ur 0.2 0.2 - 1.0 mg/dL   Nitrite, UA Negative Negative   Microscopic Examination See below:       Assessment & Plan:   Problem List Items Addressed This Visit       Cardiovascular and Mediastinum   Old MI (myocardial infarction)    In 2004, continue current medication regimen for prevention and collaboration with cardiology.      Essential hypertension    Chronic, ongoing.  Recommend ensuring medication taken daily + checking BP at home at least a few mornings a week.  Continue collaboration with cardiology and review notes.  Continue current medication regimen at this time, suspect adjustments will need to be made in future.  CCM referral is in place.  Obtain labs today: CMP, CBC, and TSH.  Return in 6 months.      Relevant Orders   CBC with Differential/Platelet   Comprehensive metabolic panel   TSH   Atherosclerotic heart disease of native coronary artery with other forms of angina pectoris (HCC)    Chronic, ongoing.  Continue current medication regimen and collaboration with cardiology.  No recent NTG use, will send in refill as needed.      Chronic diastolic heart failure (HCC) - Primary    Chronic, ongoing.  Continue current medication regimen and collaboration with cardiology.  Discussed diet choices and to cut back on sodium intake.   - Reminded to call for an overnight weight gain of >2 pounds or a weekly weight weight of >5 pounds - not adding salt to his food and has been reading food labels. Reviewed the importance of keeping daily sodium intake to 2000mg  daily         Genitourinary   Cystocele without uterine prolapse    Continue collaboration with GYN and use of pessary.        Other   Hyperlipidemia    Chronic, ongoing.  Continue current medication regimen and adjust as needed.  Lipid panel today.  Have recommended she take this daily to  help with prevention.  Discussed this at length with patient.      Relevant Orders   Lipid Panel w/o Chol/HDL Ratio   BMI 45.0-49.9, adult (HCC)    Recommend continued focus on healthy diet choices and regular physical activity (30 minutes 5 days a week).  Has poor diet at home, discussed  at length with her.      Noncompliance with medication regimen    Ongoing issue, will continue to involve CCM team to assist.      Morbid obesity (HCC)    BMI 42.38 with HTN, OLD MI, HLD. Recommended eating smaller high protein, low fat meals more frequently and exercising 30 mins a day 5 times a week with a goal of 10-15lb weight loss in the next 3 months. Patient voiced their understanding and motivation to adhere to these recommendations.       Elevated hemoglobin A1c    Recheck A1C today and initiate medication if indicated.  Discussed at length diet choices.      Relevant Orders   Hemoglobin A1c   Vitamin D deficiency    Ongoing, recommend continue daily supplement and adjust as needed.  Recheck level today.      Relevant Orders   VITAMIN D 25 Hydroxy (Vit-D Deficiency, Fractures)   Left upper quadrant abdominal pain    Reports this as ongoing issue for years, wax and wanes.  Suspect related to constipation issues, as improves per her report with Miralax use.  Will continue Miralax at home and obtain labs today to include CMP, Lipase, Amylase, TSH.  Consider imaging in future if ongoing discomfort.  Reschedule colonscopy.  Return if worsening or ongoing symptoms, then would obtain imaging.      Relevant Orders   Lipase   Amylase    Other Visit Diagnoses    B12 deficiency       History of low B12 levels in past, recheck today and start supplement as needed.   Relevant Orders   Vitamin B12       Follow up plan: Return in about 6 months (around 06/10/2021) for HTN/HLD, HF, VIT D, A1c.

## 2020-12-09 NOTE — Assessment & Plan Note (Signed)
Continue collaboration with GYN and use of pessary.

## 2020-12-09 NOTE — Assessment & Plan Note (Signed)
In 2004, continue current medication regimen for prevention and collaboration with cardiology. 

## 2020-12-09 NOTE — Assessment & Plan Note (Signed)
Chronic, ongoing.  Recommend ensuring medication taken daily + checking BP at home at least a few mornings a week.  Continue collaboration with cardiology and review notes.  Continue current medication regimen at this time, suspect adjustments will need to be made in future.  CCM referral is in place.  Obtain labs today: CMP, CBC, and TSH.  Return in 6 months.

## 2020-12-09 NOTE — Patient Instructions (Signed)

## 2020-12-09 NOTE — Assessment & Plan Note (Signed)
Chronic, ongoing.  Continue current medication regimen and collaboration with cardiology.  No recent NTG use, will send in refill as needed. 

## 2020-12-10 LAB — COMPREHENSIVE METABOLIC PANEL
ALT: 13 IU/L (ref 0–32)
AST: 19 IU/L (ref 0–40)
Albumin/Globulin Ratio: 1.3 (ref 1.2–2.2)
Albumin: 4.4 g/dL (ref 3.7–4.7)
Alkaline Phosphatase: 65 IU/L (ref 44–121)
BUN/Creatinine Ratio: 11 — ABNORMAL LOW (ref 12–28)
BUN: 8 mg/dL (ref 8–27)
Bilirubin Total: 0.4 mg/dL (ref 0.0–1.2)
CO2: 21 mmol/L (ref 20–29)
Calcium: 10.1 mg/dL (ref 8.7–10.3)
Chloride: 97 mmol/L (ref 96–106)
Creatinine, Ser: 0.75 mg/dL (ref 0.57–1.00)
Globulin, Total: 3.5 g/dL (ref 1.5–4.5)
Glucose: 82 mg/dL (ref 65–99)
Potassium: 4.2 mmol/L (ref 3.5–5.2)
Sodium: 136 mmol/L (ref 134–144)
Total Protein: 7.9 g/dL (ref 6.0–8.5)
eGFR: 85 mL/min/{1.73_m2} (ref >59)

## 2020-12-10 LAB — CBC WITH DIFFERENTIAL/PLATELET
Basophils Absolute: 0.1 10*3/uL (ref 0.0–0.2)
Basos: 1 %
EOS (ABSOLUTE): 0.3 10*3/uL (ref 0.0–0.4)
Eos: 5 %
Hematocrit: 42.1 % (ref 34.0–46.6)
Hemoglobin: 13.8 g/dL (ref 11.1–15.9)
Immature Grans (Abs): 0 10*3/uL (ref 0.0–0.1)
Immature Granulocytes: 0 %
Lymphocytes Absolute: 2.2 10*3/uL (ref 0.7–3.1)
Lymphs: 41 %
MCH: 26.1 pg — ABNORMAL LOW (ref 26.6–33.0)
MCHC: 32.8 g/dL (ref 31.5–35.7)
MCV: 80 fL (ref 79–97)
Monocytes Absolute: 0.4 10*3/uL (ref 0.1–0.9)
Monocytes: 8 %
Neutrophils Absolute: 2.4 10*3/uL (ref 1.4–7.0)
Neutrophils: 45 %
Platelets: 222 10*3/uL (ref 150–450)
RBC: 5.29 x10E6/uL — ABNORMAL HIGH (ref 3.77–5.28)
RDW: 15.4 % (ref 11.7–15.4)
WBC: 5.4 10*3/uL (ref 3.4–10.8)

## 2020-12-10 LAB — LIPID PANEL W/O CHOL/HDL RATIO
Cholesterol, Total: 145 mg/dL (ref 100–199)
HDL: 39 mg/dL — ABNORMAL LOW (ref >39)
LDL Chol Calc (NIH): 88 mg/dL (ref 0–99)
Triglycerides: 93 mg/dL (ref 0–149)
VLDL Cholesterol Cal: 18 mg/dL (ref 5–40)

## 2020-12-10 LAB — HEMOGLOBIN A1C
Est. average glucose Bld gHb Est-mCnc: 126 mg/dL
Hgb A1c MFr Bld: 6 % — ABNORMAL HIGH (ref 4.8–5.6)

## 2020-12-10 LAB — VITAMIN B12: Vitamin B-12: 1143 pg/mL (ref 232–1245)

## 2020-12-10 LAB — LIPASE: Lipase: 36 U/L (ref 14–85)

## 2020-12-10 LAB — VITAMIN D 25 HYDROXY (VIT D DEFICIENCY, FRACTURES): Vit D, 25-Hydroxy: 33.5 ng/mL (ref 30.0–100.0)

## 2020-12-10 LAB — AMYLASE: Amylase: 48 U/L (ref 31–110)

## 2020-12-10 LAB — TSH: TSH: 3.62 u[IU]/mL (ref 0.450–4.500)

## 2020-12-10 NOTE — Progress Notes (Signed)
Good afternoon, please let Jennifer Grant know her labs have returned.  Overall everything remains stable with exception of A1c which remains in prediabetic range at 6%, continue to work on diet reducing sugars and carbohydrates to prevent this from entering diabetic range of 6.5% or greater.  Vitamin B12 level is normal, as is Vitamin D, continue supplements.  Kidney and liver function are nice and normal.  Have a great day!!  Tell her I plan to try that spicy goat meal someday.  It is on my future try list. Keep being awesome!!  Thank you for allowing me to participate in your care. Kindest regards, Sarahelizabeth Conway

## 2020-12-12 ENCOUNTER — Ambulatory Visit (INDEPENDENT_AMBULATORY_CARE_PROVIDER_SITE_OTHER): Payer: Medicare HMO | Admitting: Interventional Cardiology

## 2020-12-12 ENCOUNTER — Encounter: Payer: Self-pay | Admitting: Interventional Cardiology

## 2020-12-12 ENCOUNTER — Other Ambulatory Visit: Payer: Self-pay

## 2020-12-12 VITALS — BP 138/80 | HR 73 | Ht 61.0 in | Wt 226.0 lb

## 2020-12-12 DIAGNOSIS — I480 Paroxysmal atrial fibrillation: Secondary | ICD-10-CM | POA: Diagnosis not present

## 2020-12-12 DIAGNOSIS — I25118 Atherosclerotic heart disease of native coronary artery with other forms of angina pectoris: Secondary | ICD-10-CM

## 2020-12-12 DIAGNOSIS — I5032 Chronic diastolic (congestive) heart failure: Secondary | ICD-10-CM | POA: Diagnosis not present

## 2020-12-12 DIAGNOSIS — E785 Hyperlipidemia, unspecified: Secondary | ICD-10-CM

## 2020-12-12 DIAGNOSIS — I471 Supraventricular tachycardia: Secondary | ICD-10-CM | POA: Diagnosis not present

## 2020-12-12 DIAGNOSIS — I10 Essential (primary) hypertension: Secondary | ICD-10-CM | POA: Diagnosis not present

## 2020-12-12 NOTE — Progress Notes (Signed)
Cardiology Office Note:    Date:  12/12/2020   ID:  Jennifer Grant, DOB August 13, 1951, MRN [id number]  PCP:  Marjie Skiff, NP  Cardiologist:  Lesleigh Noe, MD   Referring MD: Marjie Skiff, NP   Chief Complaint  Patient presents with  . Coronary Artery Disease    History of Present Illness:    Jennifer Grant is a 70 y.o. female with a hx of coronary artery disease and recent acute infarction due to stent thrombosis. She has history of hypertension, hyperlipidemia, mitral valve prolapse, and chronic diastolic HF.  She is doing okay.  She is accompanied by daughter.  She has been having discomfort in the left subcostal anterior to mid axillary line.  This discomfort has been off and on for years.  She also tells me of a sharp stabbing pain in the left mid precordial area.  There are no precipitants.  It lasts for less than a minute or 2 when it occurs.  She does not know what causes it.  She also has left lower flank discomfort and equates some of the discomfort with a prolapsed bladder.  After the pessary, urinary flow has improved and the discomfort in the flank area has also lessened.  This was managed by Dr. Hildred Laser.   Past Medical History:  Diagnosis Date  . CAD (coronary artery disease)    a. 2004 s/p MI w/ PCI/stenting to the LCX, OM1, OM2, RCA, RPDA; b. 05/2015 MI/PCI: 100 OM1 (2.75x20 & 3.0x8 Promus Premier DES'), PTCA OM2; c. 07/2015 Cath/Staged PCI: LM 60, LAD 75d, LCX 52m ISR, OM1 ok, OM2 25 ISR, RCA 50p, 47m/d, 85d (3.0x28 Promus Premier DES).  . COVID 2 months ago  . Diastolic dysfunction    a. 05/2015 Echo: EF 50-55%, severe lateral HK. Gr1 DD, mildly dil LA. Mildly reduced RV fxn.  . 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    "got booster shot a few times"  . GERD (gastroesophageal reflux disease)   . Hyperlipidemia   . Hypertensive heart disease   . Menopausal state   . Morbid obesity (HCC)   . Noncompliance   . Sleep apnea     Past Surgical History:  Procedure  Laterality Date  . CARDIAC CATHETERIZATION N/A 06/08/2015   Procedure: Left Heart Cath and Coronary Angiography;  Surgeon: Lyn Records, MD;  Location: [facility] INVASIVE CV LAB;  Service: Cardiovascular;  Laterality: N/A;  . CARDIAC CATHETERIZATION N/A 06/08/2015   Procedure: Coronary Stent Intervention;  Surgeon: Lyn Records, MD;  Location: [facility] INVASIVE CV LAB;  Service: Cardiovascular;  Laterality: N/A;  . CARDIAC CATHETERIZATION N/A 07/16/2015   Procedure: Coronary Stent Intervention;  Surgeon: Lyn Records, MD;  Location: [facility] INVASIVE CV LAB;  Service: Cardiovascular;  Laterality: N/A;  . CORONARY ANGIOPLASTY WITH STENT PLACEMENT     "6 stents before 05/2015"  . KNEE ARTHROSCOPY Left 1990's  . TUBAL LIGATION  ~ 1978    Current Medications: Current Meds  Medication Sig  . amLODipine (NORVASC) 10 MG tablet Take 1 tablet (10 mg total) by mouth daily.  Marland Kitchen aspirin EC 81 MG tablet Take 81 mg by mouth daily.  . cholecalciferol (VITAMIN D3) 25 MCG (1000 UNIT) tablet Take 1,000 Units by mouth daily.  . clopidogrel (PLAVIX) 75 MG tablet Take 1 tablet (75 mg total) by mouth daily.  . hydrochlorothiazide (HYDRODIURIL) 25 MG tablet Take 1 tablet (25 mg total) by mouth daily.  . isosorbide mononitrate (IMDUR) 60 MG 24 hr  tablet Take 1 tablet (60 mg total) by mouth daily.  Marland Kitchen. losartan (COZAAR) 100 MG tablet Take 1 tablet (100 mg total) by mouth daily.  . nitroGLYCERIN (NITROSTAT) 0.4 MG SL tablet DISSOLVE ONE TABLET UNDER THE TONGUE EVERY 5 MINUTES AS NEEDED FOR CHEST PAIN.  DO NOT EXCEED A TOTAL OF 3 DOSES IN 15 MINUTES  . potassium chloride SA (KLOR-CON) 20 MEQ tablet Take 1 tablet (20 mEq total) by mouth daily.  . rosuvastatin (CRESTOR) 40 MG tablet Take 1 tablet (40 mg total) by mouth daily.  Marland Kitchen. spironolactone (ALDACTONE) 25 MG tablet Take 0.5 tablets (12.5 mg total) by mouth daily.  Marland Kitchen. UNABLE TO FIND Neo-Cell     Allergies:   Brilinta [ticagrelor], Lipitor [atorvastatin], Latex, Penicillins, and  Tekturna [aliskiren]   Social History   Socioeconomic History  . Marital status: Widowed    Spouse name: Not on file  . Number of children: Not on file  . Years of education: Not on file  . Highest education level: 11th grade  Occupational History  . Occupation: retired  Tobacco Use  . Smoking status: Never Smoker  . Smokeless tobacco: Never Used  Vaping Use  . Vaping Use: Never used  Substance and Sexual Activity  . Alcohol use: No  . Drug use: No  . Sexual activity: Not Currently    Birth control/protection: Surgical  Other Topics Concern  . Not on file  Social History Narrative  . Not on file   Social Determinants of Health   Financial Resource Strain: Low Risk   . Difficulty of Paying Living Expenses: Not hard at all  Food Insecurity: No Food Insecurity  . Worried About Programme researcher, broadcasting/film/videounning Out of Food in the Last Year: Never true  . Ran Out of Food in the Last Year: Never true  Transportation Needs: No Transportation Needs  . Lack of Transportation (Medical): No  . Lack of Transportation (Non-Medical): No  Physical Activity: Inactive  . Days of Exercise per Week: 0 days  . Minutes of Exercise per Session: 0 min  Stress: No Stress Concern Present  . Feeling of Stress : Not at all  Social Connections: Not on file     Family History: The patient's family history includes Healthy in her brother and mother; Kidney disease in her father; Prostate cancer in her father. There is no history of Cancer, Diabetes, or Heart disease.  ROS:   Please see the history of present illness.    Bladder drop problem.  Saw Dr. All other systems reviewed and are negative.  EKGs/Labs/Other Studies Reviewed:    The following studies were reviewed today: 2D Doppler echocardiogram 07/04/2020: IMPRESSIONS    1. Left ventricular ejection fraction, by estimation, is 50 to 55%. The  left ventricle has low normal function. The left ventricle demonstrates  regional wall motion abnormalities (see  scoring diagram/findings for  description). There is moderate  concentric left ventricular hypertrophy. Left ventricular diastolic  parameters are consistent with Grade I diastolic dysfunction (impaired  relaxation). Elevated left atrial pressure. The E/e' is 16.3.  2. There is severe hypokinesis of the mid-to-distal lateral LV segments  with possible akinesis of the apical lateral segments. The basal lateral  LV segments are moderately hypokinetic.  3. Right ventricular systolic function is normal. The right ventricular  size is normal.  4. Left atrial size was mildly dilated.  5. The mitral valve is normal in structure. Trivial mitral valve  regurgitation.  6. The aortic valve is tricuspid. There is  moderate calcification of the  aortic valve. There is moderate thickening of the aortic valve. Aortic  valve regurgitation is trivial. Mild to moderate aortic valve  sclerosis/calcification is present, without any  evidence of aortic stenosis.  7. Aortic dilatation noted. There is mild dilatation of the aortic root,  measuring 36 mm. There is mild to moderate dilatation of the ascending  aorta, measuring 42 mm.  8. The inferior vena cava is normal in size with greater than 50%  respiratory variability, suggesting right atrial pressure of 3 mmHg.   Comparison(s): Compared to prior TTE in 2016, there is no significant  change. There continues to be hypokinesis of the lateral LV segments with  low normal LVEF.   Continuous monitor performed 06/18/2020: Study Highlights   Underlying rhythm is NSR and Sinus Bradycardia  Two episodes SVT, fasytest 173 bpm and longest 50 seconds.  Occasional OVC' and PAC's esch ~ 1% burden  Bradycardia into low 40's during sleep  No symptoms reported.  No atrial fibrillation identified    EKG:  EKG normal sinus rhythm, nonspecific ST-T wave abnormality, left axis deviation.  Compared to the March 2021 tracing, no significant change has  occurred.  Recent Labs: 12/09/2020: ALT 13; BUN 8; Creatinine, Ser 0.75; Hemoglobin 13.8; Platelets 222; Potassium 4.2; Sodium 136; TSH 3.620  Recent Lipid Panel    Component Value Date/Time   CHOL 145 12/09/2020 1411   CHOL 206 (H) 12/28/2017 1356   TRIG 93 12/09/2020 1411   TRIG 136 12/28/2017 1356   HDL 39 (L) 12/09/2020 1411   CHOLHDL 4.5 (H) 08/09/2018 1521   CHOLHDL 3.8 07/16/2015 0733   VLDL 27 12/28/2017 1356   LDLCALC 88 12/09/2020 1411    Physical Exam:    VS:  BP 138/80   Pulse 73   Ht 5\' 1"  (1.549 m)   Wt 226 lb (102.5 kg)   LMP  (LMP Unknown)   SpO2 99%   BMI 42.70 kg/m     Wt Readings from Last 3 Encounters:  12/12/20 226 lb (102.5 kg)  12/09/20 229 lb 6.4 oz (104.1 kg)  11/25/20 230 lb 1.6 oz (104.4 kg)     GEN: Obese. No acute distress HEENT: Normal NECK: No JVD. LYMPHATICS: No lymphadenopathy CARDIAC: 2/6 right upper sternal systolic crescendo decrescendo murmur. RRR S4 gallop, or edema. VASCULAR:  Normal Pulses. No bruits. RESPIRATORY:  Clear to auscultation without rales, wheezing or rhonchi  ABDOMEN: Soft, non-tender, non-distended, No pulsatile mass, MUSCULOSKELETAL: No deformity  SKIN: Warm and dry NEUROLOGIC:  Alert and oriented x 3 PSYCHIATRIC:  Normal affect   ASSESSMENT:    1. 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 Essential hypertension   3. Chronic diastolic heart failure (HCC)   4. Hyperlipidemia, unspecified hyperlipidemia type   5. Paroxysmal atrial fibrillation (HCC)   6. SVT (supraventricular tachycardia) (HCC)    PLAN:    In order of problems listed above:  1. Secondary prevention discussed 2. Low-salt diet discussed.  Target 130/80 mmHg.   3. No significant volume overload is noted on exam. 4. Target LDL less than 70.  Most recently was 80.  Continue high intensity statin therapy. 5. No A. fib identified on long-term monitor 6. SVT for up to 50 seconds occurred  on monitor.  She is not having symptomatic palpitations.  Continue observation.    Overall education and awareness concerning primary/secondary risk prevention was discussed in detail: LDL less than 70,  hemoglobin A1c less than 7, blood pressure target less than 130/80 mmHg, >150 minutes of moderate aerobic activity per week, avoidance of smoking, weight control (via diet and exercise), and continued surveillance/management of/for obstructive sleep apnea.      Medication Adjustments/Labs and Tests Ordered: Current medicines are reviewed at length with the patient today.  Concerns regarding medicines are outlined above.  Orders Placed This Encounter  Procedures  . EKG 12-Lead   No orders of the defined types were placed in this encounter.   Patient Instructions  Medication Instructions:  Your physician recommends that you continue on your current medications as directed. Please refer to the Current Medication list given to you today.  *If you need a refill on your cardiac medications before your next appointment, please call your pharmacy*   Lab Work: None If you have labs (blood work) drawn today and your tests are completely normal, you will receive your results only by: Marland Kitchen MyChart Message (if you have MyChart) OR . A paper copy in the mail If you have any lab test that is abnormal or we need to change your treatment, we will call you to review the results.   Testing/Procedures: None   Follow-Up: At [facility], you and your health needs are our priority.  As part of our continuing mission to provide you with exceptional heart care, we have created designated Provider Care Teams.  These Care Teams include your primary Cardiologist (physician) and Advanced Practice Providers (APPs -  Physician Assistants and Nurse Practitioners) who all work together to provide you with the care you need, when you need it.  We recommend signing up for the patient portal called "MyChart".   Sign up information is provided on this After Visit Summary.  MyChart is used to connect with patients for Virtual Visits (Telemedicine).  Patients are able to view lab/test results, encounter notes, upcoming appointments, etc.  Non-urgent messages can be sent to your provider as well.   To learn more about what you can do with MyChart, go to [email or web address].    Your next appointment:   9-12 month(s)  The format for your next appointment:   In Person  Provider:   You may see Lesleigh Noe, MD or one of the following Advanced Practice Providers on your designated Care Team:    Georgie Chard, NP    Other Instructions      Signed, Lesleigh Noe, MD  12/12/2020 3:32 PM    [facility]

## 2020-12-12 NOTE — Patient Instructions (Signed)
Medication Instructions:  Your physician recommends that you continue on your current medications as directed. Please refer to the Current Medication list given to you today.  *If you need a refill on your cardiac medications before your next appointment, please call your pharmacy*   Lab Work: None If you have labs (blood work) drawn today and your tests are completely normal, you will receive your results only by: . MyChart Message (if you have MyChart) OR . A paper copy in the mail If you have any lab test that is abnormal or we need to change your treatment, we will call you to review the results.   Testing/Procedures: None   Follow-Up: At [facility], you and your health needs are our priority.  As part of our continuing mission to provide you with exceptional heart care, we have created designated Provider Care Teams.  These Care Teams include your primary Cardiologist (physician) and Advanced Practice Providers (APPs -  Physician Assistants and Nurse Practitioners) who all work together to provide you with the care you need, when you need it.  We recommend signing up for the patient portal called "MyChart".  Sign up information is provided on this After Visit Summary.  MyChart is used to connect with patients for Virtual Visits (Telemedicine).  Patients are able to view lab/test results, encounter notes, upcoming appointments, etc.  Non-urgent messages can be sent to your provider as well.   To learn more about what you can do with MyChart, go to [email or web address].    Your next appointment:   9-12 month(s)  The format for your next appointment:   In Person  Provider:   You may see Henry W Smith III, MD or one of the following Advanced Practice Providers on your designated Care Team:    Jill McDaniel, NP    Other Instructions   

## 2021-01-14 ENCOUNTER — Other Ambulatory Visit: Payer: Self-pay | Admitting: Nurse Practitioner

## 2021-01-14 DIAGNOSIS — I10 Essential (primary) hypertension: Secondary | ICD-10-CM

## 2021-01-14 DIAGNOSIS — I2510 Atherosclerotic heart disease of native coronary artery without angina pectoris: Secondary | ICD-10-CM

## 2021-01-15 NOTE — Telephone Encounter (Signed)
Pt last apt on 12/09/2020 next apt on 06/10/2021

## 2021-01-29 ENCOUNTER — Telehealth: Payer: Self-pay

## 2021-01-29 NOTE — Telephone Encounter (Signed)
Patient left a voicemail is ready to reschedule her colonoscopy

## 2021-01-30 ENCOUNTER — Other Ambulatory Visit: Payer: Self-pay

## 2021-01-30 MED ORDER — SUPREP BOWEL PREP KIT 17.5-3.13-1.6 GM/177ML PO SOLN: 1.0000 | ORAL | 0 refills | Status: DC

## 2021-01-30 NOTE — Telephone Encounter (Signed)
Pt has been contacted and rescheduled for her colonoscopy on 02/17/21.

## 2021-02-02 ENCOUNTER — Telehealth: Payer: Self-pay

## 2021-02-02 NOTE — Telephone Encounter (Signed)
-----   Message from Lyn Records, MD sent at 02/01/2021  2:45 PM EDT ----- Regarding: RE: Clinical The patient was doing well at OV 11/2020. If no new cardiac symptoms, it is okay to proceed with colonoscopy. ----- Message ----- From: Rayann Heman, CMA Sent: 01/30/2021   8:40 AM EDT To: Lyn Records, MD Subject: Clinical

## 2021-02-12 ENCOUNTER — Telehealth: Payer: Self-pay | Admitting: Interventional Cardiology

## 2021-02-12 ENCOUNTER — Telehealth: Payer: Self-pay

## 2021-02-12 NOTE — Telephone Encounter (Signed)
Patient states she has gotten clarification already on medications, no further questions.

## 2021-02-12 NOTE — Telephone Encounter (Signed)
Copied from CRM [id number]. Topic: General - Other >> Feb 12, 2021 10:56 AM Gaetana Michaelis A wrote: Reason for CRM: Patient has been scheduled for a colonoscopy on 02/17/21 and been advised of medications they're supposed to stop taking before the procedure   The patient would like to discuss this with a member of clinical staff before their procedure   Please contact to further advise when possible

## 2021-02-12 NOTE — Telephone Encounter (Signed)
Spoke with pt and she inquired about whether it would be ok for her to have her colonoscopy after being dx with sleep apnea.  Advised pt she could still have this done and that Dr. Katrinka Blazing had sent a message to the GI team that as long as she wasn't have cardiac sxs, ok to proceed.  Pt denies any cardiac issues.  Pt appreciative for call.

## 2021-02-12 NOTE — Telephone Encounter (Signed)
New message:     Patient calling asking for nurse to call her concering her up coming apt. Please advise.

## 2021-02-17 ENCOUNTER — Encounter: Payer: Self-pay | Admitting: Gastroenterology

## 2021-02-17 ENCOUNTER — Other Ambulatory Visit: Payer: Self-pay

## 2021-02-17 ENCOUNTER — Ambulatory Visit: Payer: Medicare HMO | Admitting: Pediatrics

## 2021-02-17 ENCOUNTER — Encounter
Admission: RE | Disposition: A | Discharge status: Home / Self Care | Payer: Self-pay | Source: Home / Self Care | Attending: Gastroenterology

## 2021-02-17 ENCOUNTER — Ambulatory Visit
Admission: RE | Admit: 2021-02-17 | Discharge: 2021-02-17 | Disposition: A | Discharge status: Home / Self Care | Payer: Medicare HMO | Attending: Gastroenterology | Admitting: Gastroenterology

## 2021-02-17 DIAGNOSIS — Z888 Allergy status to other drugs, medicaments and biological substances status: Secondary | ICD-10-CM | POA: Insufficient documentation

## 2021-02-17 DIAGNOSIS — Z860100 Personal history of colon polyps, unspecified: Secondary | ICD-10-CM

## 2021-02-17 DIAGNOSIS — Z8616 Personal history of COVID-19: Secondary | ICD-10-CM | POA: Diagnosis not present

## 2021-02-17 DIAGNOSIS — Y92530 Ambulatory surgery center as the place of occurrence of the external cause: Secondary | ICD-10-CM | POA: Insufficient documentation

## 2021-02-17 DIAGNOSIS — Z880 Allergy status to penicillin: Secondary | ICD-10-CM | POA: Diagnosis not present

## 2021-02-17 DIAGNOSIS — Z1211 Encounter for screening for malignant neoplasm of colon: Secondary | ICD-10-CM | POA: Diagnosis not present

## 2021-02-17 DIAGNOSIS — T888XXA Other specified complications of surgical and medical care, not elsewhere classified, initial encounter: Secondary | ICD-10-CM | POA: Insufficient documentation

## 2021-02-17 DIAGNOSIS — Z955 Presence of coronary angioplasty implant and graft: Secondary | ICD-10-CM | POA: Insufficient documentation

## 2021-02-17 DIAGNOSIS — R0902 Hypoxemia: Secondary | ICD-10-CM | POA: Diagnosis not present

## 2021-02-17 DIAGNOSIS — Z79899 Other long term (current) drug therapy: Secondary | ICD-10-CM | POA: Diagnosis not present

## 2021-02-17 DIAGNOSIS — Y838 Other surgical procedures as the cause of abnormal reaction of the patient, or of later complication, without mention of misadventure at the time of the procedure: Secondary | ICD-10-CM | POA: Insufficient documentation

## 2021-02-17 DIAGNOSIS — K5730 Diverticulosis of large intestine without perforation or abscess without bleeding: Secondary | ICD-10-CM | POA: Diagnosis not present

## 2021-02-17 DIAGNOSIS — Z91040 Latex allergy status: Secondary | ICD-10-CM | POA: Insufficient documentation

## 2021-02-17 DIAGNOSIS — Z7982 Long term (current) use of aspirin: Secondary | ICD-10-CM | POA: Diagnosis not present

## 2021-02-17 DIAGNOSIS — I959 Hypotension, unspecified: Secondary | ICD-10-CM | POA: Insufficient documentation

## 2021-02-17 DIAGNOSIS — E785 Hyperlipidemia, unspecified: Secondary | ICD-10-CM | POA: Diagnosis not present

## 2021-02-17 HISTORY — PX: COLONOSCOPY: SHX5424

## 2021-02-17 SURGERY — COLONOSCOPY
Anesthesia: General

## 2021-02-17 MED ORDER — ATROPINE SULFATE 0.4 MG/ML IJ SOLN
INTRAMUSCULAR | Status: DC | PRN
  Administered 2021-02-17: .4 mg via INTRAVENOUS

## 2021-02-17 MED ORDER — SODIUM CHLORIDE 0.9 % IV SOLN: INTRAVENOUS | Status: DC

## 2021-02-17 MED ORDER — PROPOFOL 10 MG/ML IV BOLUS
INTRAVENOUS | Status: DC | PRN
  Administered 2021-02-17: 70 mg via INTRAVENOUS

## 2021-02-17 MED ORDER — PROPOFOL 500 MG/50ML IV EMUL
INTRAVENOUS | Status: DC | PRN
  Administered 2021-02-17: 200 ug/kg/min via INTRAVENOUS

## 2021-02-17 NOTE — H&P (Signed)
Lucilla Lame, MD [facility] [address] [place], [place] [address] Phone:[phone] Fax : [phone]  Primary Care Physician:  Venita Lick, NP Primary Gastroenterologist:  Dr. Allen Norris  Pre-Procedure History & Physical: HPI:  Jennifer Grant is a 70 y.o. female is here for an colonoscopy.   Past Medical History:  Diagnosis Date   CAD (coronary artery disease)    a. 2004 s/p MI w/ PCI/stenting to the LCX, OM1, OM2, RCA, RPDA; b. 05/2015 MI/PCI: 100 OM1 (2.75x20 & 3.0x8 Promus Premier DES'), PTCA OM2; c. 07/2015 Cath/Staged PCI: LM 60, LAD 75d, LCX 13mISR, OM1 ok, OM2 25 ISR, RCA 50p, 985m, 85d (3.0x28 Promus Premier DES).   COVID 2 months ago   Diastolic dysfunction    a. 05/2015 Echo: EF 50-55%, severe lateral HK. Gr1 DD, mildly dil LA. Mildly reduced RV fxn.   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    "got booster shot a few times"   GERD (gastroesophageal reflux disease)    Hyperlipidemia    Hypertensive heart disease    Menopausal state    Morbid obesity ([facility]   Noncompliance    Sleep apnea     Past Surgical History:  Procedure Laterality Date   CARDIAC CATHETERIZATION N/A 06/08/2015   Procedure: Left Heart Cath and Coronary Angiography;  Surgeon: HeBelva CromeMD;  [facility];  Service: Cardiovascular;  Laterality: N/A;   CARDIAC CATHETERIZATION N/A 06/08/2015   Procedure: Coronary Stent Intervention;  Surgeon: HeBelva CromeMD;  [facility];  Service: Cardiovascular;  Laterality: N/A;   CARDIAC CATHETERIZATION N/A 07/16/2015   Procedure: Coronary Stent Intervention;  Surgeon: HeBelva CromeMD;  [facility];  Service: Cardiovascular;  Laterality: N/A;   CORONARY ANGIOPLASTY WITH STENT PLACEMENT     "6 stents before 05/2015"   KNEE ARTHROSCOPY Left 1990's   TUBAL LIGATION  ~ 1978    Prior to Admission medications   Medication Sig Start Date End Date Taking? Authorizing Provider  amLODipine (NORVASC) 10 MG tablet Take 1  tablet by mouth once daily 01/15/21  Yes Cannady, Jolene T, NP  aspirin EC 81 MG tablet Take 81 mg by mouth daily.   Yes [provider]  cholecalciferol (VITAMIN D3) 25 MCG (1000 UNIT) tablet Take 1,000 Units by mouth daily.   Yes [provider]  hydrochlorothiazide (HYDRODIURIL) 25 MG tablet Take 1 tablet by mouth once daily 01/15/21  Yes Cannady, Jolene T, NP  isosorbide mononitrate (IMDUR) 60 MG 24 hr tablet Take 1 tablet (60 mg total) by mouth daily. 12/04/20  Yes Cannady, JoHenrine Screws, NP  losartan (COZAAR) 100 MG tablet Take 1 tablet by mouth once daily 01/15/21  Yes Cannady, Jolene T, NP  Na Sulfate-K Sulfate-Mg Sulf (SUPREP BOWEL PREP KIT) 17.5-3.13-1.6 GM/177ML SOLN Take 1 kit by mouth as directed. 01/30/21  Yes WoLucilla LameMD  potassium chloride SA (KLOR-CON) 20 MEQ tablet Take 1 tablet by mouth once daily 01/15/21  Yes Cannady, Jolene T, NP  rosuvastatin (CRESTOR) 40 MG tablet Take 1 tablet by mouth once daily 01/15/21  Yes Cannady, Jolene T, NP  spironolactone (ALDACTONE) 25 MG tablet Take 0.5 tablets (12.5 mg total) by mouth daily. 11/22/19  Yes SmBelva CromeMD  clopidogrel (PLAVIX) 75 MG tablet Take 1 tablet by mouth once daily 01/15/21   Cannady, JoHenrine Screws, NP  metoprolol succinate (TOPROL-XL) 100 MG 24 hr tablet TAKE 1 TABLET BY MOUTH DAILY. TAKE WITH OR  IMMEDIATELY FOLOWING A MEAL. Patient not taking: Reported on 02/17/2021 01/15/21   Marnee Guarneri T, NP  nitroGLYCERIN (NITROSTAT) 0.4 MG SL tablet DISSOLVE ONE TABLET UNDER THE TONGUE EVERY 5 MINUTES AS NEEDED FOR CHEST PAIN.  DO NOT EXCEED A TOTAL OF 3 DOSES IN 15 MINUTES 06/03/20   Venita Lick, NP  UNABLE TO FIND Neo-Cell    [provider]    Allergies as of 01/29/2021 - Review Complete 12/12/2020  Allergen Reaction Noted   Brilinta [ticagrelor] Shortness Of Breath 08/06/2015   Lipitor [atorvastatin] Itching 06/09/2015   Latex Other (See Comments) 06/09/2015   Penicillins Itching and Swelling 01/13/2015    Tekturna [aliskiren] Rash 01/13/2015    Family History  Problem Relation Age of Onset   Healthy Mother    Prostate cancer Father    Kidney disease Father    Healthy Brother    Cancer Neg Hx    Diabetes Neg Hx    Heart disease Neg Hx     Social History   Socioeconomic History   Marital status: Widowed    Spouse name: Not on file   Number of children: Not on file   Years of education: Not on file   Highest education level: 11th grade  Occupational History   Occupation: retired  Tobacco Use   Smoking status: Never   Smokeless tobacco: Never  Vaping Use   Vaping Use: Never used  Substance and Sexual Activity   Alcohol use: No   Drug use: No   Sexual activity: Not Currently    Birth control/protection: Surgical  Other Topics Concern   Not on file  Social History Narrative   Not on file   Social Determinants of Health   Financial Resource Strain: Low Risk    Difficulty of Paying Living Expenses: Not hard at all  Food Insecurity: No Food Insecurity   Worried About Charity fundraiser in the Last Year: Never true   Badger in the Last Year: Never true  Transportation Needs: No Transportation Needs   Lack of Transportation (Medical): No   Lack of Transportation (Non-Medical): No  Physical Activity: Inactive   Days of Exercise per Week: 0 days   Minutes of Exercise per Session: 0 min  Stress: No Stress Concern Present   Feeling of Stress : Not at all  Social Connections: Not on file  Intimate Partner Violence: Not on file    Review of Systems: See HPI, otherwise negative ROS  Physical Exam: BP (!) 170/114   Pulse 75   Temp 97.7 F (36.5 C) (Temporal)   Resp 17   Ht _0  (1.575 m)   Wt 103.9 kg   LMP  (LMP Unknown)   SpO2 98%   BMI 41.88 kg/m  General:   Alert,  pleasant and cooperative in NAD Head:  Normocephalic and atraumatic. Neck:  Supple; no masses or thyromegaly. Lungs:  Clear throughout to auscultation.    Heart:  Regular rate and  rhythm. Abdomen:  Soft, nontender and nondistended. Normal bowel sounds, without guarding, and without rebound.   Neurologic:  Alert and  oriented x4;  grossly normal neurologically.  Impression/Plan: Jennifer Grant is here for an colonoscopy to be performed for a history of adenomatous polyps on 2015   Risks, benefits, limitations, and alternatives regarding  colonoscopy have been reviewed with the patient.  Questions have been answered.  All parties agreeable.   Lucilla Lame, MD  02/17/2021, 8:59 AM

## 2021-02-17 NOTE — Anesthesia Postprocedure Evaluation (Signed)
Anesthesia Post Note  Patient: Jennifer Grant  Procedure(s) Performed: COLONOSCOPY  Patient location during evaluation: Endoscopy Anesthesia Type: General Level of consciousness: awake and alert Pain management: pain level controlled Vital Signs Assessment: post-procedure vital signs reviewed and stable Respiratory status: spontaneous breathing, nonlabored ventilation, respiratory function stable and patient connected to nasal cannula oxygen Cardiovascular status: blood pressure returned to baseline and stable Postop Assessment: no apparent nausea or vomiting Anesthetic complications: no Comments:   Intra-op: Anesthesiologist & help called to room for desat w/ associated bradycardia. Suspect related to obstruction. Patient turned supine & placed on 100% FiO2 face mask. Propofol infusion stopped. Atropine 0.4 mg given for bradycardia. Crash cart brought in & pads placed but strong pulse throughout. HR & O2 sats improved. Discussed w/ cardiologist okay to proceed with anesthesiologist in room & jaw thrust provided to relieve obstruction. Remainder of case proceeded uneventfully & no issues in recovery.  - M. Daisee Centner MD   No notable events documented.   Last Vitals:  Vitals:   02/17/21 1016 02/17/21 1026  BP: 109/85 (!) 130/98  Pulse: 79 76  Resp: 16 15  Temp:    SpO2: 100% 99%    Last Pain:  Vitals:   02/17/21 1026  TempSrc:   PainSc: 0-No pain                 Donato Schultz

## 2021-02-17 NOTE — OR Nursing (Signed)
During the procedure, I was giving abdominal pressure to assist doctor to cecum.  Patient began to obstruct and desat.  HR dropped. Anesthesia bagged and gave Atropine.  Code cart in the room and pt attatched to defib.  Stablized the pt.  Finished the procedure

## 2021-02-17 NOTE — Op Note (Signed)
[facility] Gastroenterology Patient Name: Jennifer Grant Procedure Date: 02/17/2021 8:59 AM MRN: [id number] Account #: [id number] Date of Birth: 09-02-1950 Admit Type: Outpatient Age: 70 Room: [facility] ENDO ROOM 4 Gender: Female Note Status: Finalized Procedure:             Colonoscopy Indications:           High risk colon cancer surveillance: Personal history                         of colonic polyps Providers:             Midge Minium MD, MD Referring MD:          Lyn Records (Referring MD) Medicines:             Propofol per Anesthesia Complications:         Hypotension, Hypoxia Procedure:             Pre-Anesthesia Assessment:                        - Prior to the procedure, a History and Physical was                         performed, and patient medications and allergies were                         reviewed. The patient's tolerance of previous                         anesthesia was also reviewed. The risks and benefits                         of the procedure and the sedation options and risks                         were discussed with the patient. All questions were                         answered, and informed consent was obtained. Prior                         Anticoagulants: The patient has taken no previous                         anticoagulant or antiplatelet agents. ASA Grade                         Assessment: II - A patient with mild systemic disease.                         After reviewing the risks and benefits, the patient                         was deemed in satisfactory condition to undergo the                         procedure.  After obtaining informed consent, the colonoscope was                         passed under direct vision. Throughout the procedure,                         the patient's blood pressure, pulse, and oxygen                         saturations were monitored continuously. The                          Colonoscope was introduced through the anus and                         advanced to the the cecum, identified by appendiceal                         orifice and ileocecal valve. The colonoscopy was                         performed without difficulty. The patient tolerated                         the procedure well. The quality of the bowel                         preparation was excellent. Findings:      The perianal and digital rectal examinations were normal.      The colon (entire examined portion) appeared normal. Impression:            - The entire examined colon is normal.                        - No specimens collected. Recommendation:        - Discharge patient to home.                        - Resume previous diet.                        - Continue present medications.                        - Repeat colonoscopy is not recommended due to current                         age (50 years or older) for surveillance. Procedure Code(s):     --- Professional ---                        [phone], Colonoscopy, flexible; diagnostic, including                         collection of specimen(s) by brushing or washing, when                         performed (separate procedure) Diagnosis Code(s):     --- Professional ---  Z86.010, Personal history of colonic polyps CPT copyright 2019 American Medical Association. All rights reserved. The codes documented in this report are preliminary and upon coder review may  be revised to meet current compliance requirements. Midge Minium MD, MD 02/17/2021 9:46:42 AM This report has been signed electronically. Number of Addenda: 0 Note Initiated On: 02/17/2021 8:59 AM Scope Withdrawal Time: 0 hours 17 minutes 27 seconds  Total Procedure Duration: 0 hours 24 minutes 9 seconds  Estimated Blood Loss:  Estimated blood loss: none.      [facility]

## 2021-02-17 NOTE — Progress Notes (Signed)
Per Anesthesia, Pt tooke BP meds this A.M. at [phone] (see PTA medlist).

## 2021-02-17 NOTE — Transfer of Care (Signed)
Immediate Anesthesia Transfer of Care Note  Patient: Jennifer Grant  Procedure(s) Performed: COLONOSCOPY  Patient Location: PACU  Anesthesia Type:General  Level of Consciousness: awake, alert  and oriented  Airway & Oxygen Therapy: Patient Spontanous Breathing and Patient connected to nasal cannula oxygen  Post-op Assessment: Report given to RN and Post -op Vital signs reviewed and stable  Post vital signs: Reviewed and stable  Last Vitals:  Vitals Value Taken Time  BP 121/85 02/17/21 0956  Temp    Pulse 93 02/17/21 0958  Resp 17 02/17/21 0958  SpO2 90 % 02/17/21 0958  Vitals shown include unvalidated device data.  Last Pain:  Vitals:   02/17/21 0956  TempSrc:   PainSc: 0-No pain         Complications: No notable events documented.

## 2021-02-17 NOTE — Anesthesia Preprocedure Evaluation (Addendum)
Anesthesia Evaluation  Patient identified by MRN, date of birth, ID band Patient awake    Reviewed: Allergy & Precautions, NPO status , Patient's Chart, lab work & pertinent test results  History of Anesthesia Complications Negative for: history of anesthetic complications  Airway Mallampati: III  TM Distance: >3 FB Neck ROM: Full    Dental  (+)    Pulmonary sleep apnea ,    Pulmonary exam normal breath sounds clear to auscultation       Cardiovascular Exercise Tolerance: Good hypertension, Pt. on medications + CAD (multiple stents) and + Past MI  Normal cardiovascular exam Rhythm:Regular Rate:Normal   Walks for exercise; METs >4; denies CP or SOB w/ exertion   Neuro/Psych  Neuromuscular disease (chronic low back pain w/ sciatica) negative psych ROS   GI/Hepatic Neg liver ROS, GERD  ,  Endo/Other  Morbid obesity (BMI 42)  Renal/GU negative Renal ROS  negative genitourinary   Musculoskeletal negative musculoskeletal ROS (+)   Abdominal (+) + obese,   Peds negative pediatric ROS (+)  Hematology negative hematology ROS (+)   Anesthesia Other Findings   Reproductive/Obstetrics negative OB ROS                           Anesthesia Physical Anesthesia Plan  ASA: 3  Anesthesia Plan: General   Post-op Pain Management:    Induction:   PONV Risk Score and Plan: Propofol infusion and TIVA  Airway Management Planned: Natural Airway and Nasal Cannula  Additional Equipment: None  Intra-op Plan:   Post-operative Plan:   Informed Consent: I have reviewed the patients History and Physical, chart, labs and discussed the procedure including the risks, benefits and alternatives for the proposed anesthesia with the patient or authorized representative who has indicated his/her understanding and acceptance.     Dental advisory given  Plan Discussed with: Anesthesiologist and  CRNA  Anesthesia Plan Comments:        Anesthesia Quick Evaluation

## 2021-02-17 NOTE — Addendum Note (Signed)
Addendum  created 02/17/21 1128 by Tayshawn Purnell, CRNA   Intraprocedure Event edited    

## 2021-02-18 ENCOUNTER — Encounter: Payer: Self-pay | Admitting: Gastroenterology

## 2021-03-26 ENCOUNTER — Other Ambulatory Visit: Payer: Self-pay

## 2021-03-26 ENCOUNTER — Ambulatory Visit (INDEPENDENT_AMBULATORY_CARE_PROVIDER_SITE_OTHER): Payer: Medicare Other | Admitting: Obstetrics and Gynecology

## 2021-03-26 ENCOUNTER — Encounter: Payer: Medicare HMO | Admitting: Obstetrics and Gynecology

## 2021-03-26 ENCOUNTER — Encounter: Payer: Self-pay | Admitting: Obstetrics and Gynecology

## 2021-03-26 VITALS — BP 147/88 | HR 82 | Resp 16 | Ht 62.0 in | Wt 231.8 lb

## 2021-03-26 DIAGNOSIS — L923 Foreign body granuloma of the skin and subcutaneous tissue: Secondary | ICD-10-CM

## 2021-03-26 DIAGNOSIS — N812 Incomplete uterovaginal prolapse: Secondary | ICD-10-CM | POA: Diagnosis not present

## 2021-03-26 NOTE — Progress Notes (Signed)
    GYNECOLOGY PROGRESS NOTE  Subjective:    Patient ID: Jennifer Grant, female    DOB: 30-Jun-1951, 70 y.o.   MRN: [id number]  HPI  Patient is a 70 y.o. [id number] female who presents for 4 month pessary check.  She has a history of cystocele with rectocele and incomplete uterine prolapse.  The patient reports that the pessary was sometimes uncomfortable and vagina became irritated..  She also noted that she thinks that she may be allergic to it as she has noticed skin darkening on her hands and near her vaginal area (almost black in color) that began around the time of pessary use.  Wonders if there is another option for her.  Was using Trimo-San gel for pessary maintenance.  Pessary currently not in place today, however patient did bring it with her.   The following portions of the patient's history were reviewed and updated as appropriate: allergies, current medications, past family history, past medical history, past social history, past surgical history and problem list.  Review of Systems Pertinent items noted in HPI and remainder of comprehensive ROS otherwise negative.   Objective:   Blood pressure (!) 147/88, pulse 82, resp. rate 16, height 5\' 2"  (1.575 m), weight 231 lb 12.8 oz (105.1 kg). General appearance: alert and no distress Abdomen: soft, non-tender; bowel sounds normal; no masses,  no organomegaly Pelvic: Pelvic exam deferred today.   Assessment:   1. 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  Wonders if there is another option.  We will look into other pessary devices that may be made out of a different material, however this may take some time and may be harder to find.  Also may consider going down half the size as patient was noting some mild discomfort and rubbing of the pessary.  Also given samples of  Premarin cream to help with any vaginal irritation at this time.  Will notify if alternative found.  Otherwise discussed the possibility of surgical intervention.  Patient was initially interested in surgery several months ago however at this time declines.     , MD [facility]

## 2021-03-27 ENCOUNTER — Encounter: Payer: Medicare HMO | Admitting: Obstetrics and Gynecology

## 2021-04-02 ENCOUNTER — Other Ambulatory Visit: Payer: Self-pay | Admitting: Interventional Cardiology

## 2021-04-05 ENCOUNTER — Other Ambulatory Visit: Payer: Self-pay | Admitting: Nurse Practitioner

## 2021-04-05 DIAGNOSIS — I2510 Atherosclerotic heart disease of native coronary artery without angina pectoris: Secondary | ICD-10-CM

## 2021-04-05 NOTE — Telephone Encounter (Signed)
Requested Prescriptions  Pending Prescriptions Disp Refills  . nitroGLYCERIN (NITROSTAT) 0.4 MG SL tablet [Pharmacy Med Name: Nitroglycerin 0.4 MG Sublingual Tablet Sublingual] 25 tablet 0    Sig: DISSOLVE ONE TABLET UNDER THE TONGUE EVERY 5 MINUTES AS NEEDED FOR CHEST PAIN.  DO NOT EXCEED A TOTAL OF 3 DOSES IN 15 MINUTES     Cardiovascular:  Nitrates Failed - 04/05/2021 12:01 PM      Failed - Last BP in normal range    BP Readings from Last 1 Encounters:  03/26/21 (!) 147/88         Passed - Last Heart Rate in normal range    Pulse Readings from Last 1 Encounters:  03/26/21 82         Passed - Valid encounter within last 12 months    Recent Outpatient Visits          3 months ago Chronic diastolic heart failure (HCC)   [facility] [place], Jolene T, NP   6 months ago Chronic bilateral low back pain with right-sided sciatica   [facility] [place], Dorie Rank, NP   6 months ago Laryngitis   [facility] [place], [place], DO   7 months ago COVID-19   [facility] [place] M, DO   9 months ago Chronic diastolic heart failure [facility])   [facility] [place], Dorie Rank, NP      Future Appointments            In 3 weeks  [facility], [facility]   In 2 months Cannady, Dorie Rank, NP [facility], [facility]

## 2021-04-16 ENCOUNTER — Emergency Department
Admission: EM | Admit: 2021-04-16 | Discharge: 2021-04-16 | Disposition: A | Discharge status: Home / Self Care | Payer: Medicare Other | Attending: Emergency Medicine | Admitting: Emergency Medicine

## 2021-04-16 ENCOUNTER — Encounter: Payer: Self-pay | Admitting: Emergency Medicine

## 2021-04-16 ENCOUNTER — Other Ambulatory Visit: Payer: Self-pay

## 2021-04-16 ENCOUNTER — Emergency Department: Payer: Medicare Other

## 2021-04-16 DIAGNOSIS — I5032 Chronic diastolic (congestive) heart failure: Secondary | ICD-10-CM | POA: Diagnosis not present

## 2021-04-16 DIAGNOSIS — Z7902 Long term (current) use of antithrombotics/antiplatelets: Secondary | ICD-10-CM | POA: Diagnosis not present

## 2021-04-16 DIAGNOSIS — M25511 Pain in right shoulder: Secondary | ICD-10-CM | POA: Diagnosis not present

## 2021-04-16 DIAGNOSIS — M549 Dorsalgia, unspecified: Secondary | ICD-10-CM | POA: Diagnosis not present

## 2021-04-16 DIAGNOSIS — Z91040 Latex allergy status: Secondary | ICD-10-CM | POA: Insufficient documentation

## 2021-04-16 DIAGNOSIS — M545 Low back pain, unspecified: Secondary | ICD-10-CM | POA: Insufficient documentation

## 2021-04-16 DIAGNOSIS — M542 Cervicalgia: Secondary | ICD-10-CM | POA: Diagnosis not present

## 2021-04-16 DIAGNOSIS — M79601 Pain in right arm: Secondary | ICD-10-CM | POA: Insufficient documentation

## 2021-04-16 DIAGNOSIS — I110 Hypertensive heart disease with heart failure: Secondary | ICD-10-CM | POA: Insufficient documentation

## 2021-04-16 DIAGNOSIS — I517 Cardiomegaly: Secondary | ICD-10-CM | POA: Diagnosis not present

## 2021-04-16 DIAGNOSIS — Z7982 Long term (current) use of aspirin: Secondary | ICD-10-CM | POA: Insufficient documentation

## 2021-04-16 DIAGNOSIS — Z79899 Other long term (current) drug therapy: Secondary | ICD-10-CM | POA: Insufficient documentation

## 2021-04-16 DIAGNOSIS — Z8616 Personal history of COVID-19: Secondary | ICD-10-CM | POA: Diagnosis not present

## 2021-04-16 DIAGNOSIS — M791 Myalgia, unspecified site: Secondary | ICD-10-CM | POA: Diagnosis not present

## 2021-04-16 DIAGNOSIS — M7918 Myalgia, other site: Secondary | ICD-10-CM

## 2021-04-16 DIAGNOSIS — Y92410 Unspecified street and highway as the place of occurrence of the external cause: Secondary | ICD-10-CM | POA: Diagnosis not present

## 2021-04-16 DIAGNOSIS — I2510 Atherosclerotic heart disease of native coronary artery without angina pectoris: Secondary | ICD-10-CM | POA: Diagnosis not present

## 2021-04-16 DIAGNOSIS — M25561 Pain in right knee: Secondary | ICD-10-CM | POA: Diagnosis not present

## 2021-04-16 MED ORDER — ACETAMINOPHEN 325 MG PO TABS
650.0000 mg | ORAL_TABLET | Freq: Once | ORAL | Status: AC
  Administered 2021-04-16: 650 mg via ORAL
  Filled 2021-04-16: qty 2
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
## 2021-04-16 NOTE — Discharge Instructions (Addendum)
Your exam and x-rays are normal and reassuring at this time.  You can expect to be sore and stiff for a few days.  Follow-up with primary provider for ongoing symptoms.  Take Tylenol or Motrin as needed for pain relief.  Apply ice to reduce any painful joint, muscle stiffness.

## 2021-04-20 ENCOUNTER — Other Ambulatory Visit: Payer: Self-pay

## 2021-04-20 ENCOUNTER — Encounter: Payer: Self-pay | Admitting: Nurse Practitioner

## 2021-04-20 ENCOUNTER — Ambulatory Visit (INDEPENDENT_AMBULATORY_CARE_PROVIDER_SITE_OTHER): Payer: Medicare Other | Admitting: Nurse Practitioner

## 2021-04-20 VITALS — BP 145/85 | HR 74 | Temp 98.6°F | Ht 62.01 in | Wt 228.8 lb

## 2021-04-20 DIAGNOSIS — R0789 Other chest pain: Secondary | ICD-10-CM | POA: Diagnosis not present

## 2021-04-20 DIAGNOSIS — I5032 Chronic diastolic (congestive) heart failure: Secondary | ICD-10-CM

## 2021-04-20 DIAGNOSIS — M25511 Pain in right shoulder: Secondary | ICD-10-CM

## 2021-04-20 NOTE — Assessment & Plan Note (Signed)
Chronic, ongoing. Intermittent edema noted to legs bilaterally, especially after standing for long periods of time. Continue taking current medications. Can wear compression socks during the day to help with edema. Keep legs elevated when sitting. Continue to monitor the amount of salt in her diet. Follow-up with cardiology if symptoms worsen or don't improve.

## 2021-04-20 NOTE — Patient Instructions (Addendum)
Continue tylenol as needed for pain. You can use ice and heat as well You can wear compression socks during the day to help with the swelling in your feet
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
## 2021-04-27 ENCOUNTER — Ambulatory Visit: Payer: Self-pay
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
## 2021-04-28 ENCOUNTER — Other Ambulatory Visit: Payer: Self-pay

## 2021-04-28 ENCOUNTER — Encounter: Payer: Self-pay | Admitting: Nurse Practitioner

## 2021-04-28 ENCOUNTER — Ambulatory Visit (INDEPENDENT_AMBULATORY_CARE_PROVIDER_SITE_OTHER): Payer: Medicare Other | Admitting: Nurse Practitioner

## 2021-04-28 ENCOUNTER — Ambulatory Visit: Payer: Medicare Other | Admitting: Nurse Practitioner

## 2021-04-28 VITALS — BP 142/91 | HR 71 | Temp 99.3°F | Wt 227.0 lb

## 2021-04-28 DIAGNOSIS — M542 Cervicalgia: Secondary | ICD-10-CM | POA: Diagnosis not present

## 2021-04-28 DIAGNOSIS — R079 Chest pain, unspecified: Secondary | ICD-10-CM | POA: Diagnosis not present

## 2021-04-28 DIAGNOSIS — M25561 Pain in right knee: Secondary | ICD-10-CM

## 2021-04-28 DIAGNOSIS — M5441 Lumbago with sciatica, right side: Secondary | ICD-10-CM | POA: Diagnosis not present

## 2021-04-28 MED ORDER — TIZANIDINE HCL 4 MG PO TABS: 4.0000 mg | ORAL_TABLET | Freq: Four times a day (QID) | ORAL | 0 refills | Status: DC | PRN

## 2021-04-28 MED ORDER — DICLOFENAC SODIUM 1 % EX GEL: 2.0000 g | Freq: Four times a day (QID) | CUTANEOUS | 0 refills | Status: DC

## 2021-04-28 MED ORDER — ACETAMINOPHEN 500 MG PO TABS: 500.0000 mg | ORAL_TABLET | Freq: Four times a day (QID) | ORAL | 0 refills | Status: DC | PRN

## 2021-04-28 NOTE — Progress Notes (Signed)
EKG interpreted by me on 04/28/21 showed normal sinus rhythm with a heart rate of 65. No ST or T wave changes.
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
## 2021-04-29 ENCOUNTER — Ambulatory Visit: Payer: Medicare HMO

## 2021-05-01 ENCOUNTER — Other Ambulatory Visit: Payer: Self-pay

## 2021-05-01 ENCOUNTER — Ambulatory Visit (INDEPENDENT_AMBULATORY_CARE_PROVIDER_SITE_OTHER): Payer: Medicare Other

## 2021-05-01 VITALS — Ht 62.0 in | Wt 226.0 lb

## 2021-05-01 DIAGNOSIS — Z1231 Encounter for screening mammogram for malignant neoplasm of breast: Secondary | ICD-10-CM

## 2021-05-01 DIAGNOSIS — Z0000 Encounter for general adult medical examination without abnormal findings: Secondary | ICD-10-CM | POA: Diagnosis not present

## 2021-05-01 NOTE — Progress Notes (Signed)
I connected with Jennifer Grant today by telephone and verified that I am speaking with the correct person using two identifiers. Location patient: home Location provider: work Persons participating in the virtual visit: Sheri Gatchel, Glenna Durand LPN.   I discussed the limitations, risks, security and privacy concerns of performing an evaluation and management service by telephone and the availability of in person appointments. I also discussed with the patient that there may be a patient responsible charge related to this service. The patient expressed understanding and verbally consented to this telephonic visit.    Interactive audio and video telecommunications were attempted between this provider and patient, however failed, due to patient having technical difficulties OR patient did not have access to video capability.  We continued and completed visit with audio only.     Vital signs may be patient reported or missing.  Subjective:   Jennifer Grant is a 70 y.o. female who presents for Medicare Annual (Subsequent) preventive examination.  Review of Systems     Cardiac Risk Factors include: advanced age (>50mn, >>73women);dyslipidemia;hypertension;obesity (BMI >30kg/m2);sedentary lifestyle     Objective:    Today's Vitals   05/01/21 0834  Weight: 226 lb (102.5 kg)  Height: 5' 2"  (1.575 m)   Body mass index is 41.34 kg/m.  Advanced Directives 05/01/2021 04/16/2021 02/17/2021 06/22/2020 04/28/2020 04/19/2019 04/14/2018  Does Patient Have a Medical Advance Directive? No No No No No No No  Would patient like information on creating a medical advance directive? - No - Patient declined No - Patient declined No - Patient declined - - No - Patient declined    Current Medications (verified) Outpatient Encounter Medications as of 05/01/2021  Medication Sig   acetaminophen (TYLENOL) 500 MG tablet Take 1 tablet (500 mg total) by mouth every 6 (six) hours as needed.   amLODipine  (NORVASC) 10 MG tablet Take 1 tablet by mouth once daily   aspirin EC 81 MG tablet Take 81 mg by mouth daily.   cholecalciferol (VITAMIN D3) 25 MCG (1000 UNIT) tablet Take 1,000 Units by mouth daily.   clopidogrel (PLAVIX) 75 MG tablet Take 1 tablet by mouth once daily   diclofenac Sodium (VOLTAREN) 1 % GEL Apply 2 g topically 4 (four) times daily. Apply to right knee   hydrochlorothiazide (HYDRODIURIL) 25 MG tablet Take 1 tablet by mouth once daily   isosorbide mononitrate (IMDUR) 60 MG 24 hr tablet Take 1 tablet (60 mg total) by mouth daily.   losartan (COZAAR) 100 MG tablet Take 1 tablet by mouth once daily   nitroGLYCERIN (NITROSTAT) 0.4 MG SL tablet DISSOLVE ONE TABLET UNDER THE TONGUE EVERY 5 MINUTES AS NEEDED FOR CHEST PAIN.  DO NOT EXCEED A TOTAL OF 3 DOSES IN 15 MINUTES   potassium chloride SA (KLOR-CON) 20 MEQ tablet Take 1 tablet by mouth once daily   rosuvastatin (CRESTOR) 40 MG tablet Take 1 tablet by mouth once daily   spironolactone (ALDACTONE) 25 MG tablet Take 1/2 (one-half) tablet by mouth once daily   tiZANidine (ZANAFLEX) 4 MG tablet Take 1 tablet (4 mg total) by mouth every 6 (six) hours as needed for muscle spasms.   UNABLE TO FIND Neo-Cell   metoprolol succinate (TOPROL-XL) 100 MG 24 hr tablet TAKE 1 TABLET BY MOUTH DAILY. TAKE WITH OR IMMEDIATELY FOLOWING A MEAL. (Patient not taking: Reported on 05/01/2021)   Na Sulfate-K Sulfate-Mg Sulf (SUPREP BOWEL PREP KIT) 17.5-3.13-1.6 GM/177ML SOLN Take 1 kit by mouth as directed. (Patient not taking: Reported on  05/01/2021)   No facility-administered encounter medications on file as of 05/01/2021.    Allergies (verified) Brilinta [ticagrelor], Lipitor [atorvastatin], Latex, Penicillins, and Tekturna [aliskiren]   History: Past Medical History:  Diagnosis Date   CAD (coronary artery disease)    a. 2004 s/p MI w/ PCI/stenting to the LCX, OM1, OM2, RCA, RPDA; b. 05/2015 MI/PCI: 100 OM1 (2.75x20 & 3.0x8 Promus Premier DES'), PTCA OM2;  c. 07/2015 Cath/Staged PCI: LM 60, LAD 75d, LCX 63mISR, OM1 ok, OM2 25 ISR, RCA 50p, 963m, 85d (3.0x28 Promus Premier DES).   COVID 2 months ago   Diastolic dysfunction    a. 05/2015 Echo: EF 50-55%, severe lateral HK. Gr1 DD, mildly dil LA. Mildly reduced RV fxn.   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    "got booster shot a few times"   GERD (gastroesophageal reflux disease)    Hyperlipidemia    Hypertensive heart disease    Menopausal state    [place]bid [place]   Noncompliance    Sleep apnea    Past Surgical History:  Procedure Laterality Date   CARDIAC CATHETERIZATION N/A 06/08/2015   Procedure: Left Heart Cath and Coronary Angiography;  Surgeon: HeBelva CromeMD;  [facility];  Service: Cardiovascular;  Laterality: N/A;   CARDIAC CATHETERIZATION N/A 06/08/2015   Procedure: Coronary Stent Intervention;  Surgeon: HeBelva CromeMD;  [facility];  Service: Cardiovascular;  Laterality: N/A;   CARDIAC CATHETERIZATION N/A 07/16/2015   Procedure: Coronary Stent Intervention;  Surgeon: HeBelva CromeMD;  [facility];  Service: Cardiovascular;  Laterality: N/A;   COLONOSCOPY N/A 02/17/2021   Procedure: COLONOSCOPY;  Surgeon: WoLucilla LameMD;  Location: [facility];  Service: Endoscopy;  Laterality: N/A;   CORONARY ANGIOPLASTY WITH STENT PLACEMENT     "6 stents before 05/2015"   KNEE ARTHROSCOPY Left 1963's TUBAL LIGATION  ~ 1978   Family History  Problem Relation Age of Onset   Healthy Mother    Prostate cancer Father    Kidney disease Father    Healthy Brother    Cancer Neg Hx    Diabetes Neg Hx    Heart disease Neg Hx    Social History   Socioeconomic History   Marital status: Widowed    Spouse name: Not on file   Number of children: Not on file   Years of education: Not on file   Highest education level: 11th grade  Occupational History   Occupation: retired  Tobacco Use   Smoking status: Never   Smokeless tobacco: Never   Vaping Use   Vaping Use: Never used  Substance and Sexual Activity   Alcohol use: No   Drug use: No   Sexual activity: Not Currently    Birth control/protection: Surgical  Other Topics Concern   Not on file  Social History Narrative   Not on file   Social Determinants of Health   Financial Resource Strain: Medium Risk   Difficulty of Paying Living Expenses: Somewhat hard  Food Insecurity: Food Insecurity Present   [place] Year: Sometimes true   Ran Out of Food in the Last Year: Sometimes true  Transportation Needs: No Transportation Needs   Lack of Transportation (Medical): No   Lack of Transportation (Non-Medical): No  Physical Activity: Inactive   Days of Exercise per Week: 0 days   Minutes of Exercise per Session: 0 min  Stress: No Stress  Concern Present   Feeling of Stress : Not at all  Social Connections: Not on file    Tobacco Counseling Counseling given: Not Answered   Clinical Intake:  Pre-visit preparation completed: Yes  Pain : No/denies pain     Nutritional Status: BMI > 30  Obese Nutritional Risks: None Diabetes: No  How often do you need to have someone help you when you read instructions, pamphlets, or other written materials from your doctor or pharmacy?: 1 - Never What is the last grade level you completed in school?: 10th grade  Diabetic? no  Interpreter Needed?: No  Information entered by :: NAllen LPN   Activities of Daily Living In your present state of health, do you have any difficulty performing the following activities: 05/01/2021  Hearing? N  Vision? Y  Comment blurry at times  Difficulty concentrating or making decisions? Y  Walking or climbing stairs? N  Dressing or bathing? N  Doing errands, shopping? N  Preparing Food and eating ? N  Using the Toilet? N  In the past six months, have you accidently leaked urine? N  Do you have problems with loss of bowel control? N  Managing your  Medications? N  Managing your Finances? N  Housekeeping or managing your Housekeeping? N  Some recent data might be hidden    Patient Care Team: Venita Lick, NP as PCP - General (Nurse Practitioner) Belva Crome, MD as PCP - Cardiology (Cardiology)  Indicate any recent Medical Services you may have received from other than [facility] providers in the past year (date may be approximate).     Assessment:   This is a routine wellness examination for Jennifer Grant.  Hearing/Vision screen Vision Screening - Comments:: No regular eye exams  Dietary issues and exercise activities discussed: Current Exercise Habits: The patient does not participate in regular exercise at present   Goals Addressed             This Visit's Progress    Patient Stated       05/01/2021, wants to start walking       Depression Screen PHQ 2/9 Scores 05/01/2021 10/03/2020 04/28/2020 09/04/2019 04/19/2019 08/09/2018 04/14/2018  PHQ - 2 Score 0 0 0 0 0 0 0  PHQ- 9 Score - 0 - 0 - 0 -    Fall Risk Fall Risk  05/01/2021 04/20/2021 10/03/2020 04/28/2020 09/04/2019  Falls in the past year? 0 0 1 0 0  Number falls in past yr: - 0 - - 0  Injury with Fall? - 0 - - 0  Risk for fall due to : Medication side effect No Fall Risks - Medication side effect -  Follow up Falls evaluation completed;Education provided;Falls prevention discussed Falls evaluation completed - Falls evaluation completed;Education provided;Falls prevention discussed -    FALL RISK PREVENTION PERTAINING TO THE HOME:  Any stairs in or around the home? Yes  If so, are there any without handrails? No  Home free of loose throw rugs in walkways, pet beds, electrical cords, etc? Yes  Adequate lighting in your home to reduce risk of falls? Yes   ASSISTIVE DEVICES UTILIZED TO PREVENT FALLS:  Life alert? No  Use of a cane, walker or w/c? No  Grab bars in the bathroom? No  Shower chair or bench in shower? No  Elevated toilet seat or a handicapped toilet? No    TIMED UP AND GO:  Was the test performed? No .       Cognitive Function:  6CIT Screen 05/01/2021 04/28/2020 04/14/2018 03/11/2017  What Year? 0 points 0 points 0 points 0 points  What month? 0 points 0 points 0 points 0 points  What time? 0 points 0 points 0 points 0 points  Count back from 20 0 points 2 points 0 points 0 points  Months in reverse 0 points 0 points 0 points 0 points  Repeat phrase 8 points 8 points 0 points 0 points  Total Score 8 10 0 0    Immunizations Immunization History  Administered Date(s) Administered   Influenza, High Dose Seasonal PF 08/09/2018   Influenza,inj,Quad PF,6+ Mos 06/18/2015   Influenza-Unspecified 08/12/2014   Pneumococcal Conjugate-13 08/12/2014   Pneumococcal Polysaccharide-23 03/04/2010, 03/24/2016   Td 08/30/2004   Tdap 03/24/2016    TDAP status: Up to date  Flu Vaccine status: Due, Education has been provided regarding the importance of this vaccine. Advised may receive this vaccine at local pharmacy or Health Dept. Aware to provide a copy of the vaccination record if obtained from local pharmacy or Health Dept. Verbalized acceptance and understanding.  Pneumococcal vaccine status: Up to date  Covid-19 vaccine status: Declined, Education has been provided regarding the importance of this vaccine but patient still declined. Advised may receive this vaccine at local pharmacy or Health Dept.or vaccine clinic. Aware to provide a copy of the vaccination record if obtained from local pharmacy or Health Dept. Verbalized acceptance and understanding.  Qualifies for Shingles Vaccine? Yes   Zostavax completed No   Shingrix Completed?: No.    Education has been provided regarding the importance of this vaccine. Patient has been advised to call insurance company to determine out of pocket expense if they have not yet received this vaccine. Advised may also receive vaccine at local pharmacy or Health Dept. Verbalized acceptance and  understanding.  Screening Tests Health Maintenance  Topic Date Due   INFLUENZA VACCINE  03/30/2021   COVID-19 Vaccine (1) 05/06/2021 (Originally 10/30/1955)   MAMMOGRAM  06/10/2021 (Originally 01/12/2019)   Zoster Vaccines- Shingrix (1 of 2) 07/21/2021 (Originally 10/31/1998)   COLONOSCOPY (Pts 45-35yr Insurance coverage will need to be confirmed)  02/17/2026   TETANUS/TDAP  03/24/2026   DEXA SCAN  Completed   Hepatitis C Screening  Completed   PNA vac Low Risk Adult  Completed   HPV VACCINES  Aged Out    Health Maintenance  Health Maintenance Due  Topic Date Due   INFLUENZA VACCINE  03/30/2021    Colorectal cancer screening: No longer required.   Mammogram status: Ordered today. Pt provided with contact info and advised to call to schedule appt.   Bone Density status: Completed 11/26/2010.   Lung Cancer Screening: (Low Dose CT Chest recommended if Age 72-80years, 30 pack-year currently smoking OR have quit w/in 15years.) does not qualify.   Lung Cancer Screening Referral: no  Additional Screening:  Hepatitis C Screening: does qualify; Completed 11/04/2015  Vision Screening: Recommended annual ophthalmology exams for early detection of glaucoma and other disorders of the eye. Is the patient up to date with their annual eye exam?  No  Who is the provider or what is the name of the office in which the patient attends annual eye exams? none If pt is not established with a provider, would they like to be referred to a provider to establish care? No .   Dental Screening: Recommended annual dental exams for proper oral hygiene  Community Resource Referral / Chronic Care Management: CRR required this visit?  Yes  CCM required this visit?  No      Plan:     I have personally reviewed and noted the following in the patient's chart:   Medical and social history Use of alcohol, tobacco or illicit drugs  Current medications and supplements including opioid prescriptions.   Functional ability and status Nutritional status Physical activity Advanced directives List of other physicians Hospitalizations, surgeries, and ER visits in previous 12 months Vitals Screenings to include cognitive, depression, and falls Referrals and appointments  In addition, I have reviewed and discussed with patient certain preventive protocols, quality metrics, and best practice recommendations. A written personalized care plan for preventive services as well as general preventive health recommendations were provided to patient.     Kellie Simmering, LPN   12/05/4037   Nurse Notes:

## 2021-05-01 NOTE — Patient Instructions (Signed)
Jennifer Grant , Thank you for taking time to come for your Medicare Wellness Visit. I appreciate your ongoing commitment to your health goals. Please review the following plan we discussed and let me know if I can assist you in the future.   Screening recommendations/referrals: Colonoscopy: not required Mammogram: ordered today Bone Density: completed 11/26/2010 Recommended yearly ophthalmology/optometry visit for glaucoma screening and checkup Recommended yearly dental visit for hygiene and checkup  Vaccinations: Influenza vaccine: due Pneumococcal vaccine: completed 03/24/2016 Tdap vaccine: completed 03/24/2016, due 03/24/2026 Shingles vaccine: discussed   Covid-19:decline  Advanced directives: Advance directive discussed with you today. .  Conditions/risks identified: none  Next appointment: Follow up in one year for your annual wellness visit    Preventive Care 65 Years and Older, Female Preventive care refers to lifestyle choices and visits with your health care provider that can promote health and wellness. What does preventive care include? A yearly physical exam. This is also called an annual well check. Dental exams once or twice a year. Routine eye exams. Ask your health care provider how often you should have your eyes checked. Personal lifestyle choices, including: Daily care of your teeth and gums. Regular physical activity. Eating a healthy diet. Avoiding tobacco and drug use. Limiting alcohol use. Practicing safe sex. Taking low-dose aspirin every day. Taking vitamin and mineral supplements as recommended by your health care provider. What happens during an annual well check? The services and screenings done by your health care provider during your annual well check will depend on your age, overall health, lifestyle risk factors, and family history of disease. Counseling  Your health care provider may ask you questions about your: Alcohol use. Tobacco use. Drug  use. Emotional well-being. Home and relationship well-being. Sexual activity. Eating habits. History of falls. Memory and ability to understand (cognition). Work and work Astronomer. Reproductive health. Screening  You may have the following tests or measurements: Height, weight, and BMI. Blood pressure. Lipid and cholesterol levels. These may be checked every 5 years, or more frequently if you are over 29 years old. Skin check. Lung cancer screening. You may have this screening every year starting at age 51 if you have a 30-pack-year history of smoking and currently smoke or have quit within the past 15 years. Fecal occult blood test (FOBT) of the stool. You may have this test every year starting at age 34. Flexible sigmoidoscopy or colonoscopy. You may have a sigmoidoscopy every 5 years or a colonoscopy every 10 years starting at age 28. Hepatitis C blood test. Hepatitis B blood test. Sexually transmitted disease (STD) testing. Diabetes screening. This is done by checking your blood sugar (glucose) after you have not eaten for a while (fasting). You may have this done every 1-3 years. Bone density scan. This is done to screen for osteoporosis. You may have this done starting at age 53. Mammogram. This may be done every 1-2 years. Talk to your health care provider about how often you should have regular mammograms. Talk with your health care provider about your test results, treatment options, and if necessary, the need for more tests. Vaccines  Your health care provider may recommend certain vaccines, such as: Influenza vaccine. This is recommended every year. Tetanus, diphtheria, and acellular pertussis (Tdap, Td) vaccine. You may need a Td booster every 10 years. Zoster vaccine. You may need this after age 8. Pneumococcal 13-valent conjugate (PCV13) vaccine. One dose is recommended after age 81. Pneumococcal polysaccharide (PPSV23) vaccine. One dose is recommended after age  72. Talk to your health care provider about which screenings and vaccines you need and how often you need them. This information is not intended to replace advice given to you by your health care provider. Make sure you discuss any questions you have with your health care provider. Document Released: 09/12/2015 Document Revised: 05/05/2016 Document Reviewed: 06/17/2015 Elsevier Interactive Patient Education  2017 [place] Prevention in the Home Falls can cause injuries. They can happen to people of all ages. There are many things you can do to make your home safe and to help prevent falls. What can I do on the outside of my home? Regularly fix the edges of walkways and driveways and fix any cracks. Remove anything that might make you trip as you walk through a door, such as a raised step or threshold. Trim any bushes or trees on the path to your home. Use bright outdoor lighting. Clear any walking paths of anything that might make someone trip, such as rocks or tools. Regularly check to see if handrails are loose or broken. Make sure that both sides of any steps have handrails. Any raised decks and porches should have guardrails on the edges. Have any leaves, snow, or ice cleared regularly. Use sand or salt on walking paths during winter. Clean up any spills in your garage right away. This includes oil or grease spills. What can I do in the bathroom? Use night lights. Install grab bars by the toilet and in the tub and shower. Do not use towel bars as grab bars. Use non-skid mats or decals in the tub or shower. If you need to sit down in the shower, use a plastic, non-slip stool. Keep the floor dry. Clean up any water that spills on the floor as soon as it happens. Remove soap buildup in the tub or shower regularly. Attach bath mats securely with double-sided non-slip rug tape. Do not have throw rugs and other things on the floor that can make you trip. What can I do in the  bedroom? Use night lights. Make sure that you have a light by your bed that is easy to reach. Do not use any sheets or blankets that are too big for your bed. They should not hang down onto the floor. Have a firm chair that has side arms. You can use this for support while you get dressed. Do not have throw rugs and other things on the floor that can make you trip. What can I do in the kitchen? Clean up any spills right away. Avoid walking on wet floors. Keep items that you use a lot in easy-to-reach places. If you need to reach something above you, use a strong step stool that has a grab bar. Keep electrical cords out of the way. Do not use floor polish or wax that makes floors slippery. If you must use wax, use non-skid floor wax. Do not have throw rugs and other things on the floor that can make you trip. What can I do with my stairs? Do not leave any items on the stairs. Make sure that there are handrails on both sides of the stairs and use them. Fix handrails that are broken or loose. Make sure that handrails are as long as the stairways. Check any carpeting to make sure that it is firmly attached to the stairs. Fix any carpet that is loose or worn. Avoid having throw rugs at the top or bottom of the stairs. If you do have throw  rugs, attach them to the floor with carpet tape. Make sure that you have a light switch at the top of the stairs and the bottom of the stairs. If you do not have them, ask someone to add them for you. What else can I do to help prevent falls? Wear shoes that: Do not have high heels. Have rubber bottoms. Are comfortable and fit you well. Are closed at the toe. Do not wear sandals. If you use a stepladder: Make sure that it is fully opened. Do not climb a closed stepladder. Make sure that both sides of the stepladder are locked into place. Ask someone to hold it for you, if possible. Clearly mark and make sure that you can see: Any grab bars or  handrails. First and last steps. Where the edge of each step is. Use tools that help you move around (mobility aids) if they are needed. These include: Canes. Walkers. Scooters. Crutches. Turn on the lights when you go into a dark area. Replace any light bulbs as soon as they burn out. Set up your furniture so you have a clear path. Avoid moving your furniture around. If any of your floors are uneven, fix them. If there are any pets around you, be aware of where they are. Review your medicines with your doctor. Some medicines can make you feel dizzy. This can increase your chance of falling. Ask your doctor what other things that you can do to help prevent falls. This information is not intended to replace advice given to you by your health care provider. Make sure you discuss any questions you have with your health care provider. Document Released: 06/12/2009 Document Revised: 01/22/2016 Document Reviewed: 09/20/2014 Elsevier Interactive Patient Education  2017 [employer].

## 2021-05-07 ENCOUNTER — Telehealth: Payer: Self-pay | Admitting: Nurse Practitioner

## 2021-05-07 NOTE — Telephone Encounter (Signed)
Routing to provider to advise.  

## 2021-05-07 NOTE — Telephone Encounter (Signed)
Pt states she was to let Jolene know if she is still in pain. Pt states she is still feeling pain.  Pt states she has a pinch in her back. Also pain down her leg down her right side to the ankle.  When she coughs her chest hurts. Pt states she is not asking for pain meds.  Only wants to give Jolene updated info that she IS still hurting.

## 2021-05-08 ENCOUNTER — Telehealth: Payer: Self-pay | Admitting: *Deleted

## 2021-05-08 NOTE — Telephone Encounter (Signed)
Spoke with patient and notified her of Jolene's recommendations. Patient states she would like to be seen before her scheduled appointment in October. Patient is scheduled for 05/15/21 at 4:20 pm.

## 2021-05-08 NOTE — Telephone Encounter (Signed)
   Telephone encounter was:  Unsuccessful.  05/08/2021 Name: Jennifer Grant MRN: [id number] DOB: Mar 24, 1951  Unsuccessful outbound call made today to assist with:  Food Insecurity  Outreach Attempt:  1st Attempt  A HIPAA compliant voice message was left requesting a return call.  Instructed patient to call back at   Instructed patient to call back at [phone]  at their earliest convenience. Yehuda Mao Greenauer -[facility] Guide , Embedded Care Coordination [facility], Care Management  [phone] [address] [place] , [place] [address] Email : [email or web address]

## 2021-05-10 ENCOUNTER — Other Ambulatory Visit: Payer: Self-pay | Admitting: Nurse Practitioner

## 2021-05-10 DIAGNOSIS — I2510 Atherosclerotic heart disease of native coronary artery without angina pectoris: Secondary | ICD-10-CM

## 2021-05-10 NOTE — Telephone Encounter (Signed)
Requested Prescriptions  Pending Prescriptions Disp Refills  . nitroGLYCERIN (NITROSTAT) 0.4 MG SL tablet [Pharmacy Med Name: Nitroglycerin 0.4 MG Sublingual Tablet Sublingual] 25 tablet 0    Sig: DISSOLVE ONE TABLET UNDER THE TONGUE EVERY 5 MINUTES AS NEEDED FOR CHEST PAIN.  DO NOT EXCEED A TOTAL OF 3 DOSES IN 15 MINUTES     Cardiovascular:  Nitrates Failed - 05/10/2021 12:08 PM      Failed - Last BP in normal range    BP Readings from Last 1 Encounters:  04/28/21 (!) 142/91         Passed - Last Heart Rate in normal range    Pulse Readings from Last 1 Encounters:  04/28/21 71         Passed - Valid encounter within last 12 months    Recent Outpatient Visits          1 week ago Chest pain, unspecified type   [facility], Lauren A, NP   2 weeks ago Chronic diastolic heart failure (HCC)   [facility] Mc[place], Lauren A, NP   5 months ago Chronic diastolic heart failure (HCC)   [facility] [place], Jolene T, NP   7 months ago Chronic bilateral low back pain with right-sided sciatica   [facility] [place], Dorie Rank, NP   7 months ago Laryngitis   [facility] [place], Megan P, DO      Future Appointments            In 5 days Cannady, Dorie Rank, NP [facility]   In 1 month [place], [place] T, NP [facility]   In 12 months  [facility]

## 2021-05-11 ENCOUNTER — Ambulatory Visit: Payer: Medicare Other | Admitting: Nurse Practitioner

## 2021-05-15 ENCOUNTER — Ambulatory Visit (INDEPENDENT_AMBULATORY_CARE_PROVIDER_SITE_OTHER): Payer: Medicare Other | Admitting: Nurse Practitioner

## 2021-05-15 ENCOUNTER — Encounter: Payer: Self-pay | Admitting: Nurse Practitioner

## 2021-05-15 ENCOUNTER — Other Ambulatory Visit: Payer: Self-pay

## 2021-05-15 VITALS — BP 138/80 | HR 76 | Wt 226.0 lb

## 2021-05-15 DIAGNOSIS — M25561 Pain in right knee: Secondary | ICD-10-CM | POA: Diagnosis not present

## 2021-05-15 DIAGNOSIS — Z0100 Encounter for examination of eyes and vision without abnormal findings: Secondary | ICD-10-CM

## 2021-05-15 DIAGNOSIS — Z780 Asymptomatic menopausal state: Secondary | ICD-10-CM | POA: Diagnosis not present

## 2021-05-15 DIAGNOSIS — G8929 Other chronic pain: Secondary | ICD-10-CM | POA: Insufficient documentation

## 2021-05-15 DIAGNOSIS — M542 Cervicalgia: Secondary | ICD-10-CM | POA: Insufficient documentation

## 2021-05-15 DIAGNOSIS — Z91841 Risk for dental caries, low: Secondary | ICD-10-CM

## 2021-05-15 NOTE — Patient Instructions (Addendum)
[facility]  Address: [address] [place], [place], [place] [address]  Phone: [phone]  -- schedule both mammogram and bone density  Mammogram A mammogram is an X-ray of the breasts. This is done to check for changes that are not normal. This test can look for changes that may be caused by breast cancer or other problems. Mammograms are regularly done on women beginning at age 72. A man may have a mammogram if he has a lump or swelling in his breast. Tell a doctor: About any allergies you have. If you have breast implants. If you have had breast disease, biopsy, or surgery. If you have a family history of breast cancer. If you are breastfeeding. Whether you are pregnant or may be pregnant. What are the risks? Generally, this is a safe procedure. But problems may occur, including: Being exposed to radiation. Radiation levels are very low with this test. The need for more tests. The results were not read properly. Trouble finding breast cancer in women with dense breasts. What happens before the test? Have this test done about 1-2 weeks after your menstrual period. This is often when your breasts are the least tender. If you are visiting a new doctor or clinic, have any past mammogram images sent to your new doctor's office. Wash your breasts and under your arms on the day of the test. Do not use deodorants, perfumes, lotions, or powders on the day of the test. Take off any jewelry from your neck. Wear clothes that you can change into and out of easily. What happens during the test?  You will take off your clothes from the waist up. You will put on a gown. You will stand in front of the X-ray machine. Each breast will be placed between two plastic or glass plates. The plates will press down on your breast for a few seconds. Try to relax. This does not cause any harm to your breasts. It may not feel comfortable, but it will be very brief. X-rays will  be taken from different angles of each breast. The procedure may vary among doctors and hospitals. What can I expect after the test? The mammogram will be read by a specialist (radiologist). You may need to do parts of the test again. This depends on the quality of the images. You may go back to your normal activities. It is up to you to get the results of your test. Ask how to get your results when they are ready. Summary A mammogram is an X-ray of the breasts. It looks for changes that may be caused by breast cancer or other problems. A man may have this test if he has a lump or swelling in his breast. Before the test, tell your doctor about any breast problems that you have had in the past. Have this test done about 1-2 weeks after your menstrual period. Ask when your test results will be ready. Make sure you get your test results. This information is not intended to replace advice given to you by your health care provider. Make sure you discuss any questions you have with your health care provider. Document Revised: 06/16/2020 Document Reviewed: 06/16/2020 Elsevier Patient Education  2022 [employer].
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
## 2021-05-23 DIAGNOSIS — E559 Vitamin D deficiency, unspecified: Secondary | ICD-10-CM | POA: Diagnosis not present

## 2021-05-23 DIAGNOSIS — Z7982 Long term (current) use of aspirin: Secondary | ICD-10-CM | POA: Diagnosis not present

## 2021-05-23 DIAGNOSIS — M542 Cervicalgia: Secondary | ICD-10-CM | POA: Diagnosis not present

## 2021-05-23 DIAGNOSIS — I110 Hypertensive heart disease with heart failure: Secondary | ICD-10-CM | POA: Diagnosis not present

## 2021-05-23 DIAGNOSIS — Z9181 History of falling: Secondary | ICD-10-CM | POA: Diagnosis not present

## 2021-05-23 DIAGNOSIS — M5441 Lumbago with sciatica, right side: Secondary | ICD-10-CM | POA: Diagnosis not present

## 2021-05-23 DIAGNOSIS — G8929 Other chronic pain: Secondary | ICD-10-CM | POA: Diagnosis not present

## 2021-05-23 DIAGNOSIS — M25561 Pain in right knee: Secondary | ICD-10-CM | POA: Diagnosis not present

## 2021-05-23 DIAGNOSIS — Z8616 Personal history of COVID-19: Secondary | ICD-10-CM | POA: Diagnosis not present

## 2021-05-23 DIAGNOSIS — Z86010 Personal history of colonic polyps: Secondary | ICD-10-CM | POA: Diagnosis not present

## 2021-05-23 DIAGNOSIS — G8911 Acute pain due to trauma: Secondary | ICD-10-CM | POA: Diagnosis not present

## 2021-05-23 DIAGNOSIS — I252 Old myocardial infarction: Secondary | ICD-10-CM | POA: Diagnosis not present

## 2021-05-23 DIAGNOSIS — I5032 Chronic diastolic (congestive) heart failure: Secondary | ICD-10-CM | POA: Diagnosis not present

## 2021-05-23 DIAGNOSIS — I2510 Atherosclerotic heart disease of native coronary artery without angina pectoris: Secondary | ICD-10-CM | POA: Diagnosis not present

## 2021-05-23 DIAGNOSIS — E785 Hyperlipidemia, unspecified: Secondary | ICD-10-CM | POA: Diagnosis not present

## 2021-05-23 DIAGNOSIS — Z7902 Long term (current) use of antithrombotics/antiplatelets: Secondary | ICD-10-CM | POA: Diagnosis not present

## 2021-05-25 ENCOUNTER — Telehealth: Payer: Self-pay | Admitting: *Deleted

## 2021-05-25 NOTE — Telephone Encounter (Signed)
   Telephone encounter was:  Successful.  05/25/2021 Name: Jennifer Grant MRN: [id number] DOB: 1951/06/22  Jennifer Grant is a 70 y.o. year old female who is a primary care patient of Cannady, Dorie Rank, NP . The community resource team was consulted for assistance with Transportation Needs  and Food Insecurity  Care guide performed the following interventions: Patient provided with information about care guide support team and interviewed to confirm resource needs.  Follow Up Plan:  No further follow up planned at this time. The patient has been provided with needed resources.  Alois Cliche -[facility] Guide , Embedded Care Coordination [facility], Care Management  [phone] [address] [place] , [place] [address] Email : [email or web address]

## 2021-05-27 ENCOUNTER — Telehealth: Payer: Self-pay

## 2021-05-27 NOTE — Telephone Encounter (Signed)
Copied from CRM [id number]. Topic: General - Other >> May 27, 2021 10:39 AM Jennifer Grant wrote: Reason for CRM: Pt stated she never received a call back regarding her request for physical therapy. Pt requests call back asap. Cb# [phone]   Routing to referral coordinator to check into this. Home PT was ordered on 05/15/21

## 2021-05-27 NOTE — Telephone Encounter (Signed)
Noted  

## 2021-05-28 DIAGNOSIS — G8929 Other chronic pain: Secondary | ICD-10-CM | POA: Diagnosis not present

## 2021-05-28 DIAGNOSIS — G8911 Acute pain due to trauma: Secondary | ICD-10-CM | POA: Diagnosis not present

## 2021-05-28 DIAGNOSIS — M542 Cervicalgia: Secondary | ICD-10-CM | POA: Diagnosis not present

## 2021-05-28 DIAGNOSIS — Z7982 Long term (current) use of aspirin: Secondary | ICD-10-CM | POA: Diagnosis not present

## 2021-05-28 DIAGNOSIS — Z7902 Long term (current) use of antithrombotics/antiplatelets: Secondary | ICD-10-CM | POA: Diagnosis not present

## 2021-05-28 DIAGNOSIS — I110 Hypertensive heart disease with heart failure: Secondary | ICD-10-CM | POA: Diagnosis not present

## 2021-05-28 DIAGNOSIS — E785 Hyperlipidemia, unspecified: Secondary | ICD-10-CM | POA: Diagnosis not present

## 2021-05-28 DIAGNOSIS — I252 Old myocardial infarction: Secondary | ICD-10-CM | POA: Diagnosis not present

## 2021-05-28 DIAGNOSIS — Z8616 Personal history of COVID-19: Secondary | ICD-10-CM | POA: Diagnosis not present

## 2021-05-28 DIAGNOSIS — I2510 Atherosclerotic heart disease of native coronary artery without angina pectoris: Secondary | ICD-10-CM | POA: Diagnosis not present

## 2021-05-28 DIAGNOSIS — I5032 Chronic diastolic (congestive) heart failure: Secondary | ICD-10-CM | POA: Diagnosis not present

## 2021-05-28 DIAGNOSIS — M5441 Lumbago with sciatica, right side: Secondary | ICD-10-CM | POA: Diagnosis not present

## 2021-05-28 DIAGNOSIS — Z86010 Personal history of colonic polyps: Secondary | ICD-10-CM | POA: Diagnosis not present

## 2021-05-28 DIAGNOSIS — E559 Vitamin D deficiency, unspecified: Secondary | ICD-10-CM | POA: Diagnosis not present

## 2021-05-28 DIAGNOSIS — M25561 Pain in right knee: Secondary | ICD-10-CM | POA: Diagnosis not present

## 2021-05-28 DIAGNOSIS — Z9181 History of falling: Secondary | ICD-10-CM | POA: Diagnosis not present

## 2021-05-29 ENCOUNTER — Telehealth: Payer: Self-pay

## 2021-05-29 DIAGNOSIS — G8929 Other chronic pain: Secondary | ICD-10-CM | POA: Diagnosis not present

## 2021-05-29 DIAGNOSIS — G8911 Acute pain due to trauma: Secondary | ICD-10-CM | POA: Diagnosis not present

## 2021-05-29 DIAGNOSIS — E559 Vitamin D deficiency, unspecified: Secondary | ICD-10-CM | POA: Diagnosis not present

## 2021-05-29 DIAGNOSIS — I5032 Chronic diastolic (congestive) heart failure: Secondary | ICD-10-CM | POA: Diagnosis not present

## 2021-05-29 DIAGNOSIS — I110 Hypertensive heart disease with heart failure: Secondary | ICD-10-CM | POA: Diagnosis not present

## 2021-05-29 DIAGNOSIS — Z7902 Long term (current) use of antithrombotics/antiplatelets: Secondary | ICD-10-CM | POA: Diagnosis not present

## 2021-05-29 DIAGNOSIS — I2510 Atherosclerotic heart disease of native coronary artery without angina pectoris: Secondary | ICD-10-CM | POA: Diagnosis not present

## 2021-05-29 DIAGNOSIS — E785 Hyperlipidemia, unspecified: Secondary | ICD-10-CM | POA: Diagnosis not present

## 2021-05-29 DIAGNOSIS — Z7982 Long term (current) use of aspirin: Secondary | ICD-10-CM | POA: Diagnosis not present

## 2021-05-29 DIAGNOSIS — M25561 Pain in right knee: Secondary | ICD-10-CM | POA: Diagnosis not present

## 2021-05-29 DIAGNOSIS — M5441 Lumbago with sciatica, right side: Secondary | ICD-10-CM | POA: Diagnosis not present

## 2021-05-29 DIAGNOSIS — Z86010 Personal history of colonic polyps: Secondary | ICD-10-CM | POA: Diagnosis not present

## 2021-05-29 DIAGNOSIS — Z9181 History of falling: Secondary | ICD-10-CM | POA: Diagnosis not present

## 2021-05-29 DIAGNOSIS — M542 Cervicalgia: Secondary | ICD-10-CM | POA: Diagnosis not present

## 2021-05-29 DIAGNOSIS — Z8616 Personal history of COVID-19: Secondary | ICD-10-CM | POA: Diagnosis not present

## 2021-05-29 DIAGNOSIS — I252 Old myocardial infarction: Secondary | ICD-10-CM | POA: Diagnosis not present

## 2021-05-29 NOTE — Telephone Encounter (Signed)
Noted, have advised patient to keep consistent follow-up with her cardiologist.

## 2021-05-29 NOTE — Telephone Encounter (Signed)
Copied from CRM [id number]. Topic: General - Other >> May 29, 2021  9:27 AM Gaetana Michaelis A wrote: Reason for CRM: The patient has called to share that they have taken their PCP's directions and contacted their cardiologist but won't be able to be seen until February  Please contact further if needed   Routing to provider to make her aware.

## 2021-06-02 ENCOUNTER — Telehealth: Payer: Self-pay

## 2021-06-02 NOTE — Telephone Encounter (Signed)
Copied from CRM [id number]. Topic: General - Other >> Jun 01, 2021 12:45 PM Gaetana Michaelis A wrote: Reason for CRM: The patient has called to request orders for an MRI of their neck and back  The patient has concerns with the infrequent discomfort they're experiencing in their neck and back

## 2021-06-03 DIAGNOSIS — I252 Old myocardial infarction: Secondary | ICD-10-CM | POA: Diagnosis not present

## 2021-06-03 DIAGNOSIS — Z7982 Long term (current) use of aspirin: Secondary | ICD-10-CM | POA: Diagnosis not present

## 2021-06-03 DIAGNOSIS — G8911 Acute pain due to trauma: Secondary | ICD-10-CM | POA: Diagnosis not present

## 2021-06-03 DIAGNOSIS — Z8616 Personal history of COVID-19: Secondary | ICD-10-CM | POA: Diagnosis not present

## 2021-06-03 DIAGNOSIS — Z9181 History of falling: Secondary | ICD-10-CM | POA: Diagnosis not present

## 2021-06-03 DIAGNOSIS — G8929 Other chronic pain: Secondary | ICD-10-CM | POA: Diagnosis not present

## 2021-06-03 DIAGNOSIS — E559 Vitamin D deficiency, unspecified: Secondary | ICD-10-CM | POA: Diagnosis not present

## 2021-06-03 DIAGNOSIS — Z7902 Long term (current) use of antithrombotics/antiplatelets: Secondary | ICD-10-CM | POA: Diagnosis not present

## 2021-06-03 DIAGNOSIS — M5441 Lumbago with sciatica, right side: Secondary | ICD-10-CM | POA: Diagnosis not present

## 2021-06-03 DIAGNOSIS — I2510 Atherosclerotic heart disease of native coronary artery without angina pectoris: Secondary | ICD-10-CM | POA: Diagnosis not present

## 2021-06-03 DIAGNOSIS — Z86010 Personal history of colonic polyps: Secondary | ICD-10-CM | POA: Diagnosis not present

## 2021-06-03 DIAGNOSIS — M25561 Pain in right knee: Secondary | ICD-10-CM | POA: Diagnosis not present

## 2021-06-03 DIAGNOSIS — M542 Cervicalgia: Secondary | ICD-10-CM | POA: Diagnosis not present

## 2021-06-03 DIAGNOSIS — E785 Hyperlipidemia, unspecified: Secondary | ICD-10-CM | POA: Diagnosis not present

## 2021-06-03 DIAGNOSIS — I5032 Chronic diastolic (congestive) heart failure: Secondary | ICD-10-CM | POA: Diagnosis not present

## 2021-06-03 DIAGNOSIS — I110 Hypertensive heart disease with heart failure: Secondary | ICD-10-CM | POA: Diagnosis not present

## 2021-06-03 NOTE — Telephone Encounter (Signed)
Noted  

## 2021-06-03 NOTE — Telephone Encounter (Signed)
Pt is scheduled for Friday at 2:00pm

## 2021-06-05 ENCOUNTER — Ambulatory Visit: Payer: Medicare Other | Admitting: Nurse Practitioner
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
## 2021-06-06 ENCOUNTER — Encounter: Payer: Self-pay | Admitting: Nurse Practitioner

## 2021-06-06 DIAGNOSIS — I700 Atherosclerosis of aorta: Secondary | ICD-10-CM | POA: Insufficient documentation

## 2021-06-08 ENCOUNTER — Other Ambulatory Visit: Payer: Self-pay

## 2021-06-08 ENCOUNTER — Encounter: Payer: Self-pay | Admitting: Nurse Practitioner

## 2021-06-08 ENCOUNTER — Ambulatory Visit (INDEPENDENT_AMBULATORY_CARE_PROVIDER_SITE_OTHER): Payer: Medicare Other | Admitting: Nurse Practitioner

## 2021-06-08 VITALS — BP 130/88 | HR 66 | Ht 62.0 in | Wt 224.0 lb

## 2021-06-08 DIAGNOSIS — M542 Cervicalgia: Secondary | ICD-10-CM | POA: Diagnosis not present

## 2021-06-08 DIAGNOSIS — I5032 Chronic diastolic (congestive) heart failure: Secondary | ICD-10-CM | POA: Diagnosis not present

## 2021-06-08 DIAGNOSIS — I700 Atherosclerosis of aorta: Secondary | ICD-10-CM

## 2021-06-08 DIAGNOSIS — I10 Essential (primary) hypertension: Secondary | ICD-10-CM | POA: Diagnosis not present

## 2021-06-08 DIAGNOSIS — R7309 Other abnormal glucose: Secondary | ICD-10-CM

## 2021-06-08 DIAGNOSIS — M5441 Lumbago with sciatica, right side: Secondary | ICD-10-CM

## 2021-06-08 DIAGNOSIS — E785 Hyperlipidemia, unspecified: Secondary | ICD-10-CM | POA: Diagnosis not present

## 2021-06-08 DIAGNOSIS — G8929 Other chronic pain: Secondary | ICD-10-CM | POA: Diagnosis not present

## 2021-06-08 LAB — BAYER DCA HB A1C WAIVED: HB A1C (BAYER DCA - WAIVED): 5.9 % — ABNORMAL HIGH (ref 4.8–5.6)

## 2021-06-08 NOTE — Assessment & Plan Note (Signed)
A1C 5.9%. Still elevated, however still pre-diabetic. She should limit her sugars and carbohydrates.

## 2021-06-08 NOTE — Assessment & Plan Note (Signed)
Noted on CXR 04/16/21. Check lipid panel today.
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
## 2021-06-08 NOTE — Assessment & Plan Note (Signed)
Chronic, stable. Continue current regimen. Will check CMP and CBC today. Follow up in 6 months.

## 2021-06-08 NOTE — Assessment & Plan Note (Signed)
She has not wanted to take statins in the past. Will check lipid panel today.
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
## 2021-06-08 NOTE — Assessment & Plan Note (Signed)
Chronic, stable. No signs of fluid overload on exam today. Follows with cardiology and has an appointment with Dr. Katrinka Blazing in February 2023. Continue collaboration and recommendations from cardiology.

## 2021-06-09 LAB — CBC WITH DIFFERENTIAL/PLATELET
Basophils Absolute: 0 10*3/uL (ref 0.0–0.2)
Basos: 1 %
EOS (ABSOLUTE): 0.2 10*3/uL (ref 0.0–0.4)
Eos: 3 %
Hematocrit: 45 % (ref 34.0–46.6)
Hemoglobin: 14.3 g/dL (ref 11.1–15.9)
Immature Grans (Abs): 0 10*3/uL (ref 0.0–0.1)
Immature Granulocytes: 0 %
Lymphocytes Absolute: 1.9 10*3/uL (ref 0.7–3.1)
Lymphs: 34 %
MCH: 26 pg — ABNORMAL LOW (ref 26.6–33.0)
MCHC: 31.8 g/dL (ref 31.5–35.7)
MCV: 82 fL (ref 79–97)
Monocytes Absolute: 0.4 10*3/uL (ref 0.1–0.9)
Monocytes: 7 %
Neutrophils Absolute: 3 10*3/uL (ref 1.4–7.0)
Neutrophils: 55 %
Platelets: 242 10*3/uL (ref 150–450)
RBC: 5.51 x10E6/uL — ABNORMAL HIGH (ref 3.77–5.28)
RDW: 15 % (ref 11.7–15.4)
WBC: 5.5 10*3/uL (ref 3.4–10.8)

## 2021-06-09 LAB — COMPREHENSIVE METABOLIC PANEL
ALT: 15 IU/L (ref 0–32)
AST: 25 IU/L (ref 0–40)
Albumin/Globulin Ratio: 1.2 (ref 1.2–2.2)
Albumin: 4.7 g/dL (ref 3.7–4.7)
Alkaline Phosphatase: 68 IU/L (ref 44–121)
BUN/Creatinine Ratio: 14 (ref 12–28)
BUN: 11 mg/dL (ref 8–27)
Bilirubin Total: 0.5 mg/dL (ref 0.0–1.2)
CO2: 21 mmol/L (ref 20–29)
Calcium: 10.4 mg/dL — ABNORMAL HIGH (ref 8.7–10.3)
Chloride: 98 mmol/L (ref 96–106)
Creatinine, Ser: 0.8 mg/dL (ref 0.57–1.00)
Globulin, Total: 3.9 g/dL (ref 1.5–4.5)
Glucose: 96 mg/dL (ref 70–99)
Potassium: 4.3 mmol/L (ref 3.5–5.2)
Sodium: 136 mmol/L (ref 134–144)
Total Protein: 8.6 g/dL — ABNORMAL HIGH (ref 6.0–8.5)
eGFR: 78 mL/min/{1.73_m2} (ref >59)

## 2021-06-09 LAB — LIPID PANEL W/O CHOL/HDL RATIO
Cholesterol, Total: 159 mg/dL (ref 100–199)
HDL: 40 mg/dL (ref >39)
LDL Chol Calc (NIH): 102 mg/dL — ABNORMAL HIGH (ref 0–99)
Triglycerides: 92 mg/dL (ref 0–149)
VLDL Cholesterol Cal: 17 mg/dL (ref 5–40)

## 2021-06-10 ENCOUNTER — Ambulatory Visit: Payer: Medicare HMO | Admitting: Nurse Practitioner

## 2021-06-15 ENCOUNTER — Telehealth: Payer: Self-pay | Admitting: Nurse Practitioner

## 2021-06-15 DIAGNOSIS — M542 Cervicalgia: Secondary | ICD-10-CM

## 2021-06-15 NOTE — Telephone Encounter (Signed)
Patient calling about lab results I dont show released, Please call back

## 2021-06-15 NOTE — Telephone Encounter (Signed)
Called and read mychart message to the patient from Lauren regarding her lab results.   Patient also requesting an MRI for her neck and arm pain.

## 2021-06-19 DIAGNOSIS — I252 Old myocardial infarction: Secondary | ICD-10-CM | POA: Diagnosis not present

## 2021-06-19 DIAGNOSIS — Z86010 Personal history of colonic polyps: Secondary | ICD-10-CM | POA: Diagnosis not present

## 2021-06-19 DIAGNOSIS — I110 Hypertensive heart disease with heart failure: Secondary | ICD-10-CM | POA: Diagnosis not present

## 2021-06-19 DIAGNOSIS — G8929 Other chronic pain: Secondary | ICD-10-CM | POA: Diagnosis not present

## 2021-06-19 DIAGNOSIS — Z7982 Long term (current) use of aspirin: Secondary | ICD-10-CM | POA: Diagnosis not present

## 2021-06-19 DIAGNOSIS — I5032 Chronic diastolic (congestive) heart failure: Secondary | ICD-10-CM | POA: Diagnosis not present

## 2021-06-19 DIAGNOSIS — M542 Cervicalgia: Secondary | ICD-10-CM | POA: Diagnosis not present

## 2021-06-19 DIAGNOSIS — E785 Hyperlipidemia, unspecified: Secondary | ICD-10-CM | POA: Diagnosis not present

## 2021-06-19 DIAGNOSIS — Z9181 History of falling: Secondary | ICD-10-CM | POA: Diagnosis not present

## 2021-06-19 DIAGNOSIS — M5441 Lumbago with sciatica, right side: Secondary | ICD-10-CM | POA: Diagnosis not present

## 2021-06-19 DIAGNOSIS — M25561 Pain in right knee: Secondary | ICD-10-CM | POA: Diagnosis not present

## 2021-06-19 DIAGNOSIS — I2510 Atherosclerotic heart disease of native coronary artery without angina pectoris: Secondary | ICD-10-CM | POA: Diagnosis not present

## 2021-06-19 DIAGNOSIS — G8911 Acute pain due to trauma: Secondary | ICD-10-CM | POA: Diagnosis not present

## 2021-06-19 DIAGNOSIS — Z7902 Long term (current) use of antithrombotics/antiplatelets: Secondary | ICD-10-CM | POA: Diagnosis not present

## 2021-06-19 DIAGNOSIS — E559 Vitamin D deficiency, unspecified: Secondary | ICD-10-CM | POA: Diagnosis not present

## 2021-06-19 DIAGNOSIS — Z8616 Personal history of COVID-19: Secondary | ICD-10-CM | POA: Diagnosis not present

## 2021-06-22 DIAGNOSIS — Z86010 Personal history of colonic polyps: Secondary | ICD-10-CM | POA: Diagnosis not present

## 2021-06-22 DIAGNOSIS — Z9181 History of falling: Secondary | ICD-10-CM | POA: Diagnosis not present

## 2021-06-22 DIAGNOSIS — E785 Hyperlipidemia, unspecified: Secondary | ICD-10-CM | POA: Diagnosis not present

## 2021-06-22 DIAGNOSIS — G8929 Other chronic pain: Secondary | ICD-10-CM | POA: Diagnosis not present

## 2021-06-22 DIAGNOSIS — I5032 Chronic diastolic (congestive) heart failure: Secondary | ICD-10-CM | POA: Diagnosis not present

## 2021-06-22 DIAGNOSIS — Z8616 Personal history of COVID-19: Secondary | ICD-10-CM | POA: Diagnosis not present

## 2021-06-22 DIAGNOSIS — I252 Old myocardial infarction: Secondary | ICD-10-CM | POA: Diagnosis not present

## 2021-06-22 DIAGNOSIS — Z7902 Long term (current) use of antithrombotics/antiplatelets: Secondary | ICD-10-CM | POA: Diagnosis not present

## 2021-06-22 DIAGNOSIS — M5441 Lumbago with sciatica, right side: Secondary | ICD-10-CM | POA: Diagnosis not present

## 2021-06-22 DIAGNOSIS — Z7982 Long term (current) use of aspirin: Secondary | ICD-10-CM | POA: Diagnosis not present

## 2021-06-22 DIAGNOSIS — M542 Cervicalgia: Secondary | ICD-10-CM | POA: Diagnosis not present

## 2021-06-22 DIAGNOSIS — I110 Hypertensive heart disease with heart failure: Secondary | ICD-10-CM | POA: Diagnosis not present

## 2021-06-22 DIAGNOSIS — I2510 Atherosclerotic heart disease of native coronary artery without angina pectoris: Secondary | ICD-10-CM | POA: Diagnosis not present

## 2021-06-22 DIAGNOSIS — M25561 Pain in right knee: Secondary | ICD-10-CM | POA: Diagnosis not present

## 2021-06-22 DIAGNOSIS — E559 Vitamin D deficiency, unspecified: Secondary | ICD-10-CM | POA: Diagnosis not present

## 2021-06-22 DIAGNOSIS — G8911 Acute pain due to trauma: Secondary | ICD-10-CM | POA: Diagnosis not present

## 2021-06-23 ENCOUNTER — Telehealth: Payer: Self-pay | Admitting: Nurse Practitioner

## 2021-06-23 DIAGNOSIS — G8929 Other chronic pain: Secondary | ICD-10-CM

## 2021-06-23 DIAGNOSIS — M5441 Lumbago with sciatica, right side: Secondary | ICD-10-CM

## 2021-06-23 NOTE — Telephone Encounter (Signed)
Copied from CRM [id number]. Topic: General - Other >> Jun 23, 2021 11:03 AM Gwenlyn Fudge wrote: Reason for CRM: Pt called and is requesting to have PCP redo her order for her MRI. She states that she is needing to have an MRI on neck, shoulders, lungs, and lower back. She states that she received a call from imaging this morning and they state that the order is put in incorrectly. Please advise.

## 2021-06-24 NOTE — Telephone Encounter (Signed)
Spoke with patient and made her aware of Lauren's recommendations. Patient states the reason she asked about imaging of her lungs due to she can't yawn without any pain. Patient was advised to give our office a call back if the pain continues to worsen. Patient states she was called to get scheduled for her imaging. Patient verbalized understanding.

## 2021-06-24 NOTE — Telephone Encounter (Signed)
Copied from CRM [id number]. Topic: General - Other >> Jun 24, 2021 11:00 AM Pawlus, Maxine Glenn A wrote: Reason for CRM: [facility] stated they have re-faxed some home health orders over that need to be signed, please advise if you have received any orders, fax number [phone].

## 2021-06-25 ENCOUNTER — Ambulatory Visit
Admission: RE | Admit: 2021-06-25 | Discharge: 2021-06-25 | Disposition: A | Discharge status: Home / Self Care | Payer: Medicare Other | Source: Ambulatory Visit | Attending: Nurse Practitioner | Admitting: Nurse Practitioner

## 2021-06-25 ENCOUNTER — Other Ambulatory Visit: Payer: Self-pay

## 2021-06-25 DIAGNOSIS — M542 Cervicalgia: Secondary | ICD-10-CM | POA: Insufficient documentation

## 2021-06-26 DIAGNOSIS — Z7982 Long term (current) use of aspirin: Secondary | ICD-10-CM | POA: Diagnosis not present

## 2021-06-26 DIAGNOSIS — Z7902 Long term (current) use of antithrombotics/antiplatelets: Secondary | ICD-10-CM | POA: Diagnosis not present

## 2021-06-26 DIAGNOSIS — M542 Cervicalgia: Secondary | ICD-10-CM | POA: Diagnosis not present

## 2021-06-26 DIAGNOSIS — I110 Hypertensive heart disease with heart failure: Secondary | ICD-10-CM | POA: Diagnosis not present

## 2021-06-26 DIAGNOSIS — M5441 Lumbago with sciatica, right side: Secondary | ICD-10-CM | POA: Diagnosis not present

## 2021-06-26 DIAGNOSIS — I252 Old myocardial infarction: Secondary | ICD-10-CM | POA: Diagnosis not present

## 2021-06-26 DIAGNOSIS — G8929 Other chronic pain: Secondary | ICD-10-CM | POA: Diagnosis not present

## 2021-06-26 DIAGNOSIS — Z8616 Personal history of COVID-19: Secondary | ICD-10-CM | POA: Diagnosis not present

## 2021-06-26 DIAGNOSIS — M25561 Pain in right knee: Secondary | ICD-10-CM | POA: Diagnosis not present

## 2021-06-26 DIAGNOSIS — G8911 Acute pain due to trauma: Secondary | ICD-10-CM | POA: Diagnosis not present

## 2021-06-26 DIAGNOSIS — Z86010 Personal history of colonic polyps: Secondary | ICD-10-CM | POA: Diagnosis not present

## 2021-06-26 DIAGNOSIS — I5032 Chronic diastolic (congestive) heart failure: Secondary | ICD-10-CM | POA: Diagnosis not present

## 2021-06-26 DIAGNOSIS — I2510 Atherosclerotic heart disease of native coronary artery without angina pectoris: Secondary | ICD-10-CM | POA: Diagnosis not present

## 2021-06-26 DIAGNOSIS — Z9181 History of falling: Secondary | ICD-10-CM | POA: Diagnosis not present

## 2021-06-26 DIAGNOSIS — E559 Vitamin D deficiency, unspecified: Secondary | ICD-10-CM | POA: Diagnosis not present

## 2021-06-26 DIAGNOSIS — E785 Hyperlipidemia, unspecified: Secondary | ICD-10-CM | POA: Diagnosis not present

## 2021-07-07 ENCOUNTER — Ambulatory Visit: Payer: Medicare Other | Admitting: Nurse Practitioner

## 2021-07-07 ENCOUNTER — Telehealth: Payer: Self-pay | Admitting: Nurse Practitioner

## 2021-07-07 DIAGNOSIS — G8929 Other chronic pain: Secondary | ICD-10-CM

## 2021-07-07 DIAGNOSIS — M542 Cervicalgia: Secondary | ICD-10-CM

## 2021-07-07 NOTE — Telephone Encounter (Signed)
Copied from CRM [id number]. Topic: General - Inquiry >> Jul 07, 2021  2:06 PM Daphine Deutscher D wrote: Pt called for her MRI results.  Pt also states she needs a bone density test scheduled  Daughters #  [phone]

## 2021-07-08 NOTE — Telephone Encounter (Signed)
Sue Lush, from [facility], calling in regards to pts MRI results. She states that they cannot discharge pt without these. Please advise.     Fax# [phone]   Callback # [phone]

## 2021-07-09 ENCOUNTER — Ambulatory Visit: Payer: Medicare Other

## 2021-07-10 ENCOUNTER — Telehealth: Payer: Self-pay | Admitting: Nurse Practitioner

## 2021-07-10 DIAGNOSIS — Z7902 Long term (current) use of antithrombotics/antiplatelets: Secondary | ICD-10-CM | POA: Diagnosis not present

## 2021-07-10 DIAGNOSIS — Z86010 Personal history of colonic polyps: Secondary | ICD-10-CM | POA: Diagnosis not present

## 2021-07-10 DIAGNOSIS — E785 Hyperlipidemia, unspecified: Secondary | ICD-10-CM | POA: Diagnosis not present

## 2021-07-10 DIAGNOSIS — I252 Old myocardial infarction: Secondary | ICD-10-CM | POA: Diagnosis not present

## 2021-07-10 DIAGNOSIS — I5032 Chronic diastolic (congestive) heart failure: Secondary | ICD-10-CM | POA: Diagnosis not present

## 2021-07-10 DIAGNOSIS — I110 Hypertensive heart disease with heart failure: Secondary | ICD-10-CM | POA: Diagnosis not present

## 2021-07-10 DIAGNOSIS — I2510 Atherosclerotic heart disease of native coronary artery without angina pectoris: Secondary | ICD-10-CM | POA: Diagnosis not present

## 2021-07-10 DIAGNOSIS — M542 Cervicalgia: Secondary | ICD-10-CM | POA: Diagnosis not present

## 2021-07-10 DIAGNOSIS — M25561 Pain in right knee: Secondary | ICD-10-CM | POA: Diagnosis not present

## 2021-07-10 DIAGNOSIS — G8911 Acute pain due to trauma: Secondary | ICD-10-CM | POA: Diagnosis not present

## 2021-07-10 DIAGNOSIS — E559 Vitamin D deficiency, unspecified: Secondary | ICD-10-CM | POA: Diagnosis not present

## 2021-07-10 DIAGNOSIS — Z8616 Personal history of COVID-19: Secondary | ICD-10-CM | POA: Diagnosis not present

## 2021-07-10 DIAGNOSIS — Z9181 History of falling: Secondary | ICD-10-CM | POA: Diagnosis not present

## 2021-07-10 DIAGNOSIS — Z7982 Long term (current) use of aspirin: Secondary | ICD-10-CM | POA: Diagnosis not present

## 2021-07-10 DIAGNOSIS — G8929 Other chronic pain: Secondary | ICD-10-CM | POA: Diagnosis not present

## 2021-07-10 DIAGNOSIS — M5441 Lumbago with sciatica, right side: Secondary | ICD-10-CM | POA: Diagnosis not present

## 2021-07-10 NOTE — Telephone Encounter (Signed)
Spoke with patient to notify her of recent lab imaging results per Rodman Pickle, NP and notified patient that her results were sent to her MyChart on 06/29/21 at 7:51 am by Rodman Pickle, NP. Patient daughter states she would check to make sure her mother still has her MyChart active. Patient states she would like to move forward with the referral to Orthopedics. Patient asked about her appointment with Cardiologist in February. Patient was advised to give their office a call to see if they have any cancellations to be seen sooner. Patient daughter verbalized understanding and has no further questions at this time.

## 2021-07-10 NOTE — Telephone Encounter (Signed)
Patient states she is disappointed she has not received a call regarding her imaging results. Patient would like a follow up call on her daughter # [phone]. Patient would like a follow up call today

## 2021-07-10 NOTE — Telephone Encounter (Signed)
Jasmine December from [facility] -  phone # [phone] option1  fax # [phone]  Calling checking on the status of plan of care (485) form. Caller states she will re fax again to [phone] please note when received. Caller would like request expedited stating she has been waiting since the start of care date  05/03/2021. Caller would like a follow up call.

## 2021-07-13 ENCOUNTER — Telehealth: Payer: Self-pay

## 2021-07-13 DIAGNOSIS — Z8616 Personal history of COVID-19: Secondary | ICD-10-CM | POA: Diagnosis not present

## 2021-07-13 DIAGNOSIS — Z86010 Personal history of colonic polyps: Secondary | ICD-10-CM | POA: Diagnosis not present

## 2021-07-13 DIAGNOSIS — Z7902 Long term (current) use of antithrombotics/antiplatelets: Secondary | ICD-10-CM | POA: Diagnosis not present

## 2021-07-13 DIAGNOSIS — E785 Hyperlipidemia, unspecified: Secondary | ICD-10-CM | POA: Diagnosis not present

## 2021-07-13 DIAGNOSIS — I2510 Atherosclerotic heart disease of native coronary artery without angina pectoris: Secondary | ICD-10-CM | POA: Diagnosis not present

## 2021-07-13 DIAGNOSIS — Z9181 History of falling: Secondary | ICD-10-CM | POA: Diagnosis not present

## 2021-07-13 DIAGNOSIS — I5032 Chronic diastolic (congestive) heart failure: Secondary | ICD-10-CM | POA: Diagnosis not present

## 2021-07-13 DIAGNOSIS — M25561 Pain in right knee: Secondary | ICD-10-CM | POA: Diagnosis not present

## 2021-07-13 DIAGNOSIS — I252 Old myocardial infarction: Secondary | ICD-10-CM | POA: Diagnosis not present

## 2021-07-13 DIAGNOSIS — M542 Cervicalgia: Secondary | ICD-10-CM | POA: Diagnosis not present

## 2021-07-13 DIAGNOSIS — I110 Hypertensive heart disease with heart failure: Secondary | ICD-10-CM | POA: Diagnosis not present

## 2021-07-13 DIAGNOSIS — M5441 Lumbago with sciatica, right side: Secondary | ICD-10-CM | POA: Diagnosis not present

## 2021-07-13 DIAGNOSIS — E559 Vitamin D deficiency, unspecified: Secondary | ICD-10-CM | POA: Diagnosis not present

## 2021-07-13 DIAGNOSIS — Z7982 Long term (current) use of aspirin: Secondary | ICD-10-CM | POA: Diagnosis not present

## 2021-07-13 DIAGNOSIS — G8929 Other chronic pain: Secondary | ICD-10-CM | POA: Diagnosis not present

## 2021-07-13 DIAGNOSIS — G8911 Acute pain due to trauma: Secondary | ICD-10-CM | POA: Diagnosis not present

## 2021-07-13 NOTE — Telephone Encounter (Signed)
-----   Message from Marjie Skiff, NP sent at 07/10/2021  1:48 PM EST ----- I filled something out yesterday or day before to fax, did it go through do you know?  Let me know if you see any new paperwork and I will sign ASAP. ----- Message ----- From: Malen Gauze, CMA Sent: 07/10/2021   1:29 PM EST To: Marjie Skiff, NP   ----- Message ----- From: Janae Sauce Sent: 07/10/2021  11:28 AM EST To: [facility]

## 2021-07-13 NOTE — Telephone Encounter (Signed)
Called and spoke with patient about results of MRI and recommend orthopedic referral. She missed last appointment with PCP, have her scheduled to follow-up again in 2 weeks.

## 2021-07-13 NOTE — Telephone Encounter (Signed)
Paperwork was faxed over last week and confirmation said confirmed. Paperwork was re-faxed over today and confirmation sheet states the paperwork was confirmed.

## 2021-07-27 ENCOUNTER — Other Ambulatory Visit: Payer: Self-pay | Admitting: Interventional Cardiology
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
## 2021-08-03 ENCOUNTER — Other Ambulatory Visit: Payer: Self-pay

## 2021-08-03 ENCOUNTER — Encounter: Payer: Self-pay | Admitting: Nurse Practitioner

## 2021-08-03 ENCOUNTER — Ambulatory Visit (INDEPENDENT_AMBULATORY_CARE_PROVIDER_SITE_OTHER): Payer: Medicare Other | Admitting: Nurse Practitioner

## 2021-08-03 VITALS — BP 137/83 | HR 66 | Temp 97.9°F | Wt 229.0 lb

## 2021-08-03 DIAGNOSIS — I10 Essential (primary) hypertension: Secondary | ICD-10-CM

## 2021-08-03 DIAGNOSIS — R1012 Left upper quadrant pain: Secondary | ICD-10-CM

## 2021-08-03 DIAGNOSIS — M542 Cervicalgia: Secondary | ICD-10-CM
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
## 2021-08-03 NOTE — Assessment & Plan Note (Signed)
Chronic, stable. She has read information on amlodipine being recalled and doesn't want to continue this medication. Discussed that her blood pressure is controlled and she has not noticed any side effects. We could discontinue this medication, but would need to start a different medication. She is really hesitant about starting new medications and would like to continue the amlodipine for now. Discussed the goal is to have her BP <140/90. She can also watch her salt intake, exercise, and lose weight to help with blood pressure control.

## 2021-08-03 NOTE — Patient Instructions (Addendum)
Orthopedist:  Address: [address] [place], [place], [place] [address] Phone: [phone]   Prilosec can help with acid reflux  Goal for blood pressure less than 140 (top number) and less than 90 (bottom number)

## 2021-08-21 DIAGNOSIS — M545 Low back pain, unspecified: Secondary | ICD-10-CM | POA: Diagnosis not present

## 2021-08-21 DIAGNOSIS — M25511 Pain in right shoulder: Secondary | ICD-10-CM | POA: Diagnosis not present

## 2021-09-15 ENCOUNTER — Other Ambulatory Visit: Payer: Self-pay | Admitting: Nurse Practitioner

## 2021-09-15 ENCOUNTER — Telehealth: Payer: Self-pay | Admitting: Nurse Practitioner

## 2021-09-15 DIAGNOSIS — G8929 Other chronic pain: Secondary | ICD-10-CM
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
## 2021-09-15 NOTE — Telephone Encounter (Signed)
Requested Prescriptions  Pending Prescriptions Disp Refills   isosorbide mononitrate (IMDUR) 60 MG 24 hr tablet [Pharmacy Med Name: Isosorbide Mononitrate ER 60 MG Oral Tablet Extended Release 24 Hour] 90 tablet 0    Sig: Take 1 tablet by mouth once daily     Cardiovascular:  Nitrates Passed - 09/15/2021  4:21 PM      Passed - Last BP in normal range    BP Readings from Last 1 Encounters:  08/03/21 137/83         Passed - Last Heart Rate in normal range    Pulse Readings from Last 1 Encounters:  08/03/21 66         Passed - Valid encounter within last 12 months    Recent Outpatient Visits          1 month ago Essential hypertension   [facility] [place], Lauren A, NP   3 months ago Neck pain   [facility] McElwee, Lauren A, NP   4 months ago Neck pain   [facility] [place], Louin T, NP   4 months ago Chest pain, unspecified type   [facility] McElwee, Lauren A, NP   4 months ago Chronic diastolic heart failure (HCC)   [facility] Mc[place]wee, Jake Church, NP      Future Appointments            In 2 weeks Marjie Skiff, NP [facility]   In 1 month Katrinka Blazing, Barry Dienes, MD [facility]   In 7 months  [facility]

## 2021-09-17 NOTE — Telephone Encounter (Signed)
Attempted to contact patient, unable to LVM. Will try again later.

## 2021-09-18 NOTE — Telephone Encounter (Signed)
Pt returned call, pt can be reached at [phone].

## 2021-09-18 NOTE — Telephone Encounter (Signed)
Attempted to return patient phone call no answer, unable to LVM

## 2021-09-21 ENCOUNTER — Telehealth: Payer: Self-pay | Admitting: Obstetrics and Gynecology

## 2021-09-21 NOTE — Telephone Encounter (Signed)
Patient would like referral to [facility] on [address] [facility] in [place] Patient is aware of provider message.

## 2021-09-21 NOTE — Telephone Encounter (Signed)
Pt called asked about pessary- states she was waiting on a call when it arrived - please advise.

## 2021-09-23 NOTE — Telephone Encounter (Signed)
I have tried to contact patient several times to inform her that there is no other kind of pessary available, besides the silicone. Not a working number.

## 2021-09-24 NOTE — Telephone Encounter (Signed)
Sent pt a mychart message asking her to call office so we can update her contact information

## 2021-09-29 ENCOUNTER — Ambulatory Visit: Payer: Medicare Other | Attending: Nurse Practitioner

## 2021-09-29 ENCOUNTER — Ambulatory Visit: Payer: Medicare Other

## 2021-10-03 NOTE — Patient Instructions (Signed)

## 2021-10-05 ENCOUNTER — Other Ambulatory Visit: Payer: Self-pay

## 2021-10-05 ENCOUNTER — Encounter: Payer: Self-pay | Admitting: Nurse Practitioner

## 2021-10-05 ENCOUNTER — Ambulatory Visit (INDEPENDENT_AMBULATORY_CARE_PROVIDER_SITE_OTHER): Payer: Medicare Other | Admitting: Nurse Practitioner

## 2021-10-05 VITALS — BP 136/84 | HR 74 | Temp 98.2°F | Ht 62.0 in | Wt 232.8 lb

## 2021-10-05 DIAGNOSIS — M25511 Pain in right shoulder: Secondary | ICD-10-CM | POA: Diagnosis not present

## 2021-10-05 DIAGNOSIS — G8929 Other chronic pain: Secondary | ICD-10-CM

## 2021-10-05 DIAGNOSIS — M25561 Pain in right knee: Secondary | ICD-10-CM

## 2021-10-05 MED ORDER — LIDOCAINE 5 % EX PTCH: 1.0000 | MEDICATED_PATCH | CUTANEOUS | 0 refills | Status: DC
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
## 2021-10-25 NOTE — Progress Notes (Unsigned)
Cardiology Office Note:    Date:  10/25/2021   ID:  Jennifer Grant, DOB Nov 27, 1950, MRN [id number]  PCP:  Venita Lick, NP  Cardiologist:  Sinclair Grooms, MD   Referring MD: Venita Lick, NP   No chief complaint on file.   History of Present Illness:    OLLA SERVISS is a 71 y.o. female with a hx of coronary artery disease and recent acute infarction due to stent thrombosis. She has history of hypertension, hyperlipidemia, mitral valve prolapse, and chronic diastolic HF.   ***  Past Medical History:  Diagnosis Date   CAD (coronary artery disease)    a. 2004 s/p MI w/ PCI/stenting to the LCX, OM1, OM2, RCA, RPDA; b. 05/2015 MI/PCI: 100 OM1 (2.75x20 & 3.0x8 Promus Premier DES'), PTCA OM2; c. 07/2015 Cath/Staged PCI: LM 60, LAD 75d, LCX 60m ISR, OM1 ok, OM2 25 ISR, RCA 50p, 10m/d, 85d (3.0x28 Promus Premier DES).   COVID 2 months ago   Diastolic dysfunction    a. 05/2015 Echo: EF 50-55%, severe lateral HK. Gr1 DD, mildly dil LA. Mildly reduced RV fxn.   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    "got booster shot a few times"   GERD (gastroesophageal reflux disease)    Hyperlipidemia    Hypertensive heart disease    Menopausal state    Morbid obesity ([place])    Noncompliance    Sleep apnea     Past Surgical History:  Procedure Laterality Date   CARDIAC CATHETERIZATION N/A 06/08/2015   Procedure: Left Heart Cath and Coronary Angiography;  Surgeon: Belva Crome, MD;  Location: [place] [facility];  Service: Cardiovascular;  Laterality: N/A;   CARDIAC CATHETERIZATION N/A 06/08/2015   Procedure: Coronary Stent Intervention;  Surgeon: Belva Crome, MD;  Location: [place] [facility];  Service: Cardiovascular;  Laterality: N/A;   CARDIAC CATHETERIZATION N/A 07/16/2015   Procedure: Coronary Stent Intervention;  Surgeon: Belva Crome, MD;  Location: [place] [facility];  Service: Cardiovascular;  Laterality: N/A;   COLONOSCOPY N/A 02/17/2021   Procedure: COLONOSCOPY;  Surgeon: Lucilla Lame,  MD;  Location: [facility];  Service: Endoscopy;  Laterality: N/A;   CORONARY ANGIOPLASTY WITH STENT PLACEMENT     "6 stents before 05/2015"   KNEE ARTHROSCOPY Left 1990's   TUBAL LIGATION  ~ 1978    Current Medications: No outpatient medications have been marked as taking for the 10/26/21 encounter (Appointment) with Belva Crome, MD.     Allergies:   Brilinta [ticagrelor], Lipitor [atorvastatin], Latex, Penicillins, and Tekturna [aliskiren]   Social History   Socioeconomic History   Marital status: Widowed    Spouse name: Not on file   Number of children: Not on file   Years of education: Not on file   Highest education level: 11th grade  Occupational History   Occupation: retired  Tobacco Use   Smoking status: Never   Smokeless tobacco: Never  Vaping Use   Vaping Use: Never used  Substance and Sexual Activity   Alcohol use: No   Drug use: No   Sexual activity: Not Currently    Birth control/protection: Surgical  Other Topics Concern   Not on file  Social History Narrative   Not on file   Social Determinants of Health   Financial Resource Strain: Medium Risk   Difficulty of Paying Living Expenses: Somewhat hard  Food Insecurity: Food Insecurity Present   Worried About Running Out of Food in the Last Year: Sometimes true  Ran Out of Food in the Last Year: Sometimes true  Transportation Needs: No Transportation Needs   Lack of Transportation (Medical): No   Lack of Transportation (Non-Medical): No  Physical Activity: Inactive   Days of Exercise per Week: 0 days   Minutes of Exercise per Session: 0 min  Stress: No Stress Concern Present   Feeling of Stress : Not at all  Social Connections: Not on file     Family History: The patient's family history includes Healthy in her brother and mother; Kidney disease in her father; Prostate cancer in her father. There is no history of Cancer, Diabetes, or Heart disease.  ROS:   Please see the history of present  illness.    *** All other systems reviewed and are negative.  EKGs/Labs/Other Studies Reviewed:    The following studies were reviewed today: ECHOCARDIOGRAM 2021: IMPRESSIONS     1. Left ventricular ejection fraction, by estimation, is 50 to 55%. The  left ventricle has low normal function. The left ventricle demonstrates  regional wall motion abnormalities (see scoring diagram/findings for  description). There is moderate  concentric left ventricular hypertrophy. Left ventricular diastolic  parameters are consistent with Grade I diastolic dysfunction (impaired  relaxation). Elevated left atrial pressure. The E/e' is 16.3.   2. There is severe hypokinesis of the mid-to-distal lateral LV segments  with possible akinesis of the apical lateral segments. The basal lateral  LV segments are moderately hypokinetic.   3. Right ventricular systolic function is normal. The right ventricular  size is normal.   4. Left atrial size was mildly dilated.   5. The mitral valve is normal in structure. Trivial mitral valve  regurgitation.   6. The aortic valve is tricuspid. There is moderate calcification of the  aortic valve. There is moderate thickening of the aortic valve. Aortic  valve regurgitation is trivial. Mild to moderate aortic valve  sclerosis/calcification is present, without any   evidence of aortic stenosis.   7. Aortic dilatation noted. There is mild dilatation of the aortic root,  measuring 36 mm. There is mild to moderate dilatation of the ascending  aorta, measuring 42 mm.   8. The inferior vena cava is normal in size with greater than 50%  respiratory variability, suggesting right atrial pressure of 3 mmHg.   Comparison(s): Compared to prior TTE in 2016, there is no significant  change. There continues to be hypokinesis of the lateral LV segments with  low normal LVEF.     EKG:  EKG ***  Recent Labs: 12/09/2020: TSH 3.620 06/08/2021: ALT 15; BUN 11; Creatinine, Ser 0.80;  Hemoglobin 14.3; Platelets 242; Potassium 4.3; Sodium 136  Recent Lipid Panel    Component Value Date/Time   CHOL 159 06/08/2021 0840   CHOL 206 (H) 12/28/2017 1356   TRIG 92 06/08/2021 0840   TRIG 136 12/28/2017 1356   HDL 40 06/08/2021 0840   CHOLHDL 4.5 (H) 08/09/2018 1521   CHOLHDL 3.8 07/16/2015 0733   VLDL 27 12/28/2017 1356   LDLCALC 102 (H) 06/08/2021 0840    Physical Exam:    VS:  LMP  (LMP Unknown)     Wt Readings from Last 3 Encounters:  10/05/21 232 lb 12.8 oz (105.6 kg)  08/03/21 229 lb (103.9 kg)  06/08/21 224 lb (101.6 kg)     GEN: ***. No acute distress HEENT: Normal NECK: No JVD. LYMPHATICS: No lymphadenopathy CARDIAC: *** murmur. RRR *** gallop, or edema. VASCULAR: *** Normal Pulses. No bruits. RESPIRATORY:  Clear  to auscultation without rales, wheezing or rhonchi  ABDOMEN: Soft, non-tender, non-distended, No pulsatile mass, MUSCULOSKELETAL: No deformity  SKIN: Warm and dry NEUROLOGIC:  Alert and oriented x 3 PSYCHIATRIC:  Normal affect   ASSESSMENT:    1. Coronary artery disease of native artery of native heart with stable angina pectoris ([place])   2. Essential hypertension   3. Chronic diastolic heart failure ([place])   4. Hyperlipidemia, unspecified hyperlipidemia type   5. Paroxysmal atrial fibrillation ([facility])   6. Systolic murmur    PLAN:    In order of problems listed above:  ***   Medication Adjustments/Labs and Tests Ordered: Current medicines are reviewed at length with the patient today.  Concerns regarding medicines are outlined above.  No orders of the defined types were placed in this encounter.  No orders of the defined types were placed in this encounter.   There are no Patient Instructions on file for this visit.   Signed, Sinclair Grooms, MD  10/25/2021 10:39 AM    [place]

## 2021-10-26 ENCOUNTER — Ambulatory Visit: Payer: Medicare Other | Admitting: Interventional Cardiology

## 2021-10-26 DIAGNOSIS — I5032 Chronic diastolic (congestive) heart failure: Secondary | ICD-10-CM

## 2021-10-26 DIAGNOSIS — I10 Essential (primary) hypertension: Secondary | ICD-10-CM

## 2021-10-26 DIAGNOSIS — I480 Paroxysmal atrial fibrillation: Secondary | ICD-10-CM

## 2021-10-26 DIAGNOSIS — I25118 Atherosclerotic heart disease of native coronary artery with other forms of angina pectoris: Secondary | ICD-10-CM

## 2021-10-26 DIAGNOSIS — E785 Hyperlipidemia, unspecified: Secondary | ICD-10-CM

## 2021-10-26 DIAGNOSIS — R011 Cardiac murmur, unspecified: Secondary | ICD-10-CM

## 2021-11-26 ENCOUNTER — Other Ambulatory Visit: Payer: Self-pay | Admitting: Nurse Practitioner

## 2021-11-27 NOTE — Telephone Encounter (Signed)
Requested Prescriptions  ?Pending Prescriptions Disp Refills  ?? isosorbide mononitrate (IMDUR) 60 MG 24 hr tablet [Pharmacy Med Name: Isosorbide Mononitrate ER 60 MG Oral Tablet Extended Release 24 Hour] 90 tablet 0  ?  Sig: Take 1 tablet by mouth once daily  ?  ? Cardiovascular:  Nitrates Passed - 11/26/2021  4:23 PM  ?  ?  Passed - Last BP in normal range  ?  BP Readings from Last 1 Encounters:  ?10/05/21 136/84  ?   ?  ?  Passed - Last Heart Rate in normal range  ?  Pulse Readings from Last 1 Encounters:  ?10/05/21 74  ?   ?  ?  Passed - Valid encounter within last 12 months  ?  Recent Outpatient Visits   ?      ? 1 month ago Chronic right shoulder pain  ? [facility] [place], Avalon T, NP  ? 3 months ago Essential hypertension  ? [facility], Lauren A, NP  ? 5 months ago Neck pain  ? [facility], Lauren A, NP  ? 6 months ago Neck pain  ? [facility] [place], Spurgeon T, NP  ? 7 months ago Chest pain, unspecified type  ? [place], Jake Church, NP  ?  ?  ?Future Appointments   ?        ? In 1 week Marjie Skiff, NP [facility], [facility]  ? In 2 months Lyn Records, MD [facility]  ? In 5 months  [facility]  ?  ? ?  ?  ?  ? ?

## 2021-11-28 NOTE — Patient Instructions (Addendum)
Scheduled bone density and mammogram: ?Please call to schedule your mammogram and/or bone density: ?[facility]  ?Address: [address], [place], [place] [address] ?Phone: [phone]  ? ?Heart Failure Action Plan ?A heart failure action plan helps you understand what to do when you have symptoms of heart failure. Your action plan is a color-coded plan that lists the symptoms to watch for and indicates what actions to take. ?If you have symptoms in the red zone, you need medical care right away. ?If you have symptoms in the yellow zone, you are having problems. ?If you have symptoms in the green zone, you are doing well. ?Follow the plan that was created by you and your health care provider. Review your plan each time you visit your health care provider. ?Red zone ?These signs and symptoms mean you should get medical help right away: ?You have trouble breathing when resting. ?You have a dry cough that is getting worse. ?You have swelling or pain in your legs or abdomen that is getting worse. ?You suddenly gain more than 2-3 lb (0.9-1.4 kg) in 24 hours, or more than 5 lb (2.3 kg) in a week. This amount may be more or less depending on your condition. ?You have trouble staying awake or you feel confused. ?You have chest pain. ?You do not have an appetite. ?You pass out. ?You have worsening sadness or depression. ?If you have any of these symptoms, call your local emergency services (911 in the [place] right away. Do not drive yourself to the hospital. ?Yellow zone ?These signs and symptoms mean your condition may be getting worse and you should make some changes: ?You have trouble breathing when you are active, or you need to sleep with your head raised on extra pillows to help you breathe. ?You have swelling in your legs or abdomen. ?You gain 2-3 lb (0.9-1.4 kg) in 24 hours, or 5 lb (2.3 kg) in a week. This amount may be more or less depending on your condition. ?You get  tired easily. ?You have trouble sleeping. ?You have a dry cough. ?If you have any of these symptoms: ?Contact your health care provider within the next day. ?Your health care provider may adjust your medicines. ?Green zone ?These signs mean you are doing well and can continue what you are doing: ?You do not have shortness of breath. ?You have very little swelling or no new swelling. ?Your weight is stable (no gain or loss). ?You have a normal activity level. ?You do not have chest pain or any other new symptoms. ?Follow these instructions at home: ?Take over-the-counter and prescription medicines only as told by your health care provider. ?Weigh yourself daily. Your target weight is __________ lb (__________ kg). ?Call your health care provider if you gain more than __________ lb (__________ kg) in 24 hours, or more than __________ lb (__________ kg) in a week. ?Health care provider name: _____________________________________________________ ?Health care provider phone number: _____________________________________________________ ?Eat a heart-healthy diet. Work with a diet and nutrition specialist (dietitian) to create an eating plan that is best for you. ?Keep all follow-up visits. This is important. ?Where to find more information ?American Heart Association: www.heart.org ?Summary ?A heart failure action plan helps you understand what to do when you have symptoms of heart failure. ?Follow the action plan that was created by you and your health care provider. ?Get help right away if you have any symptoms in the red zone. ?This information is not intended to  replace advice given to you by your health care provider. Make sure you discuss any questions you have with your health care provider. ?Document Revised: 03/31/2020 Document Reviewed: 03/31/2020 ?Elsevier Patient Education ? [employer]. ? ?

## 2021-12-04 ENCOUNTER — Ambulatory Visit (INDEPENDENT_AMBULATORY_CARE_PROVIDER_SITE_OTHER): Payer: Medicare Other | Admitting: Nurse Practitioner

## 2021-12-04 ENCOUNTER — Encounter: Payer: Self-pay | Admitting: Nurse Practitioner

## 2021-12-04 VITALS — BP 138/86 | HR 84 | Temp 98.9°F | Ht 62.0 in | Wt 237.2 lb

## 2021-12-04 DIAGNOSIS — E782 Mixed hyperlipidemia: Secondary | ICD-10-CM | POA: Diagnosis not present

## 2021-12-04 DIAGNOSIS — I252 Old myocardial infarction: Secondary | ICD-10-CM

## 2021-12-04 DIAGNOSIS — I471 Supraventricular tachycardia, unspecified: Secondary | ICD-10-CM | POA: Insufficient documentation

## 2021-12-04 DIAGNOSIS — G8929 Other chronic pain: Secondary | ICD-10-CM

## 2021-12-04 DIAGNOSIS — Z780 Asymptomatic menopausal state: Secondary | ICD-10-CM

## 2021-12-04 DIAGNOSIS — I700 Atherosclerosis of aorta: Secondary | ICD-10-CM | POA: Diagnosis not present

## 2021-12-04 DIAGNOSIS — I25118 Atherosclerotic heart disease of native coronary artery with other forms of angina pectoris: Secondary | ICD-10-CM

## 2021-12-04 DIAGNOSIS — Z91148 Patient's other noncompliance with medication regimen for other reason: Secondary | ICD-10-CM

## 2021-12-04 DIAGNOSIS — E559 Vitamin D deficiency, unspecified: Secondary | ICD-10-CM | POA: Diagnosis not present

## 2021-12-04 DIAGNOSIS — I10 Essential (primary) hypertension: Secondary | ICD-10-CM

## 2021-12-04 DIAGNOSIS — Z6841 Body Mass Index (BMI) 40.0 and over, adult: Secondary | ICD-10-CM

## 2021-12-04 DIAGNOSIS — R7309 Other abnormal glucose: Secondary | ICD-10-CM

## 2021-12-04 DIAGNOSIS — M5441 Lumbago with sciatica, right side: Secondary | ICD-10-CM | POA: Diagnosis not present

## 2021-12-04 DIAGNOSIS — M25561 Pain in right knee: Secondary | ICD-10-CM | POA: Diagnosis not present

## 2021-12-04 DIAGNOSIS — I5032 Chronic diastolic (congestive) heart failure: Secondary | ICD-10-CM

## 2021-12-04 DIAGNOSIS — Z1231 Encounter for screening mammogram for malignant neoplasm of breast: Secondary | ICD-10-CM

## 2021-12-04 DIAGNOSIS — M25511 Pain in right shoulder: Secondary | ICD-10-CM

## 2021-12-04 HISTORY — DX: Supraventricular tachycardia, unspecified: I47.10

## 2021-12-04 LAB — MICROALBUMIN, URINE WAIVED
Creatinine, Urine Waived: 50 mg/dL (ref 10–300)
Microalb, Ur Waived: 30 mg/L — ABNORMAL HIGH (ref 0–19)

## 2021-12-04 LAB — BAYER DCA HB A1C WAIVED: HB A1C (BAYER DCA - WAIVED): 5.4 % (ref 4.8–5.6)

## 2021-12-04 MED ORDER — LIDOCAINE 5 % EX PTCH: 1.0000 | MEDICATED_PATCH | CUTANEOUS | 0 refills | Status: DC

## 2021-12-04 MED ORDER — LORATADINE 10 MG PO TABS: 10.0000 mg | ORAL_TABLET | Freq: Every day | ORAL | 11 refills | Status: DC

## 2021-12-04 MED ORDER — SPIRONOLACTONE 25 MG PO TABS: 12.5000 mg | ORAL_TABLET | Freq: Every day | ORAL | 3 refills | Status: DC

## 2021-12-04 MED ORDER — AMLODIPINE BESYLATE 10 MG PO TABS: 10.0000 mg | ORAL_TABLET | Freq: Every day | ORAL | 4 refills | Status: DC

## 2021-12-04 NOTE — Assessment & Plan Note (Signed)
Chronic, ongoing.  BP at goal on check today, however does eat a lot of sodium and does not consistently take medication.  Recommend ensuring medication taken daily + checking BP at home at least a few mornings a week.  Continue collaboration with cardiology and review notes.  Continue current medication regimen at this time, suspect adjustments will need to be made in future.  CCM referral is in place.  Obtain labs today.  Return in 6 months. ?

## 2021-12-04 NOTE — Assessment & Plan Note (Signed)
Ongoing, recommend continue daily supplement and adjust as needed.  Recheck level today.  Recommend she obtain DEXA scan. ?

## 2021-12-04 NOTE — Assessment & Plan Note (Signed)
Chronic, ongoing.  Continue current medication regimen and collaboration with cardiology.  No recent NTG use, will send in refill as needed. 

## 2021-12-04 NOTE — Assessment & Plan Note (Signed)
Noted on CXR 04/16/21. Check lipid panel today.  Continue preventative statin and ASA. 
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
## 2021-12-04 NOTE — Assessment & Plan Note (Signed)
Ongoing issue, will continue to involve CCM team to assist. 
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
## 2021-12-04 NOTE — Assessment & Plan Note (Signed)
Chronic, ongoing.  Euvolemic.  Continue current medication regimen and collaboration with cardiology.  Discussed diet choices and to cut back on sodium intake.   ?- Reminded to call for an overnight weight gain of >2 pounds or a weekly weight weight of >5 pounds ?- not adding salt to his food and has been reading food labels. Reviewed the importance of keeping daily sodium intake to 2000mg  daily  ?- No Ibuprofen products ?

## 2021-12-04 NOTE — Assessment & Plan Note (Signed)
Followed by cardiology, continue this collaboration, scheduled to see upcoming. ?

## 2021-12-04 NOTE — Assessment & Plan Note (Signed)
In 2004, continue current medication regimen for prevention and collaboration with cardiology. 

## 2021-12-04 NOTE — Assessment & Plan Note (Signed)
A1c improved on labs today at 5.2% and urine ALB is 30, continue Losartan for kidney protection and recommend patient focus heavily on diet changes and regular activity. ?

## 2021-12-04 NOTE — Progress Notes (Signed)
? ?BP 138/86 (BP Location: Left Arm)   Pulse 84   Temp 98.9 ?F (37.2 ?C) (Oral)   Ht 5\' 2"  (1.575 m)   Wt 237 lb 3.2 oz (107.6 kg)   LMP  (LMP Unknown)   SpO2 100%   BMI 43.38 kg/m?   ? ?Subjective:  ? ? Patient ID: Jennifer Grant, female    DOB: Sep 24, 1950, 71 y.o.   MRN: [id number] ? ?HPI: ?Jennifer Grant is a 71 y.o. female ? ?Chief Complaint  ?Patient presents with  ? Hypertension  ? Hyperlipidemia  ? A1c  ? Congestive Heart Failure  ? MRI  ?  Patient is requesting a MRI of her right side of her body since the accident. Patient states she thinks something is wrong with her ankle and her neck and shoulder still are hurting. Patient states he knees are worse.   ? ?THROAT IRRITATION ?Feels like right side of throat is sore, when singing it bothers her + hoarseness.  Started with a chip of bone that got stuck in throat many years ago, 5-6 years ago.  Clears throat a lot and has dry cough with clear mucus at times.  No pain presents.   ? ?HYPERTENSION / HYPERLIPIDEMIA/HF ?Currently taking Losartan, Amlodipine, HCTZ, Isosorbide Mono, Plavix, and ASA + Lovastatin.  Last saw cardiology on 12/02/20, is to see next in May.  She had sleep study noting OSA, does not use CPAP. ? ?She does endorse missing doses of medication, recently has been out of Amlodipine.   Ate a lot of salt this morning, cod fish + soul food. Diet is a barrier for her, she does endorse enjoying foods higher in sodium. No recent NTG use.   ? ?2021 Echo 50-55%, prior MI, calcified aortic valve causing murmur but no stenosis and thickened LV, chronic diastolic HF diagnosis on chart. History of MI in 2004.   ?Satisfied with current treatment? yes ?Duration of hypertension: chronic ?BP monitoring frequency: rarely ?BP range: 130/80 ?BP medication side effects: no ?Duration of hyperlipidemia: chronic ?Cholesterol medication side effects: no ?Cholesterol supplements: none ?Medication compliance: good compliance ?Aspirin: yes ?Recent stressors:  no ?Recurrent headaches: no ?Visual changes: no ?Palpitations: no ?Dyspnea: none ?Chest pain: no ?Lower extremity edema: no ?Dizzy/lightheaded: no  ?The ASCVD Risk score (Arnett DK, et al., 2019) failed to calculate for the following reasons: ?  The patient has a prior MI or stroke diagnosis ? ?ELEVATED A1C: ?Most recent A1c 5.9%. She does endorse eating lots of Jamaican sweet rolls over past months and poor diet choices.  ?Polydipsia/polyuria: no ?Visual disturbance: no ?Chest pain: no ?Paresthesias: no ?  ?VITAMIN D DEFICIENCY: ?On recent labs her Vit D was 33.5 and continues daily supplement.  She denies any muscle aches, fatigue, recent falls or fractures.   Last visit was noted to have mild elevation in calcium -- 10.4. ? ?ARTHRALGIAS / JOINT ACHES ?Ongoing pain since accident all to right side.  Continues to have ongoing right knee pain and would like MRI, worsening.  Continues to ache behind it and difficult to walk on.  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  Right shoulder continues to cause discomfort as well.  Did not do physical therapy due to cost.  Saw orthopedics in past, last 08/21/21. ?Duration: months ?Pain: yes ?Symmetric: right side only pain is like 100, lasts for short period ?Quality: dull, aching, and throbbing ?Frequency: intermittent ?Context:  worse ?Decreased function/range of  motion: yes ?Erythema: none ?Swelling: none ?Heat or warmth: none ?Morning stiffness: none ?Aggravating factors: movemement ?Alleviating factors: Tylenol ?Relief with NSAIDs?: No NSAIDs Taken ?Treatments attempted:  rest, ice, heat, and APAP  ? ?She is requesting referral back to Dr. Valentino Saxon today to discuss her past bladder issues and possible surgical intervention which she refused in past. ? ?Relevant past medical, surgical, family and social history reviewed and updated as indicated. Interim medical history since our last visit  reviewed. ?Allergies and medications reviewed and updated. ? ?Review of Systems  ?Constitutional:  Negative for activity change, appetite change, diaphoresis, fatigue and fever.  ?Respiratory:  Negative for cough, chest tightness and shortness of breath.   ?Cardiovascular:  Negative for chest pain, palpitations and leg swelling.  ?Gastrointestinal: Negative.   ?Endocrine: Negative for polydipsia, polyphagia and polyuria.  ?Neurological: Negative.   ?Psychiatric/Behavioral: Negative.    ? ?Per HPI unless specifically indicated above ? ?   ?Objective:  ?  ?BP 138/86 (BP Location: Left Arm)   Pulse 84   Temp 98.9 ?F (37.2 ?C) (Oral)   Ht 5\' 2"  (1.575 m)   Wt 237 lb 3.2 oz (107.6 kg)   LMP  (LMP Unknown)   SpO2 100%   BMI 43.38 kg/m?   ?Wt Readings from Last 3 Encounters:  ?12/04/21 237 lb 3.2 oz (107.6 kg)  ?10/05/21 232 lb 12.8 oz (105.6 kg)  ?08/03/21 229 lb (103.9 kg)  ?  ?Physical Exam ?Vitals and nursing note reviewed.  ?Constitutional:   ?   General: She is awake. She is not in acute distress. ?   Appearance: She is well-developed and well-groomed. She is morbidly obese. She is not ill-appearing.  ?HENT:  ?   Head: Normocephalic.  ?   Right Ear: Hearing normal.  ?   Left Ear: Hearing normal.  ?Eyes:  ?   General: Lids are normal.     ?   Right eye: No discharge.     ?   Left eye: No discharge.  ?   Conjunctiva/sclera: Conjunctivae normal.  ?   Pupils: Pupils are equal, round, and reactive to light.  ?Neck:  ?   Thyroid: No thyromegaly.  ?   Vascular: No carotid bruit.  ?Cardiovascular:  ?   Rate and Rhythm: Normal rate and regular rhythm.  ?   Heart sounds: Normal heart sounds. No murmur heard. ?  No gallop.  ?Pulmonary:  ?   Effort: Pulmonary effort is normal. No accessory muscle usage or respiratory distress.  ?   Breath sounds: Normal breath sounds.  ?Abdominal:  ?   General: Bowel sounds are normal.  ?   Palpations: Abdomen is soft.  ?   Tenderness: There is no abdominal tenderness.  ?Musculoskeletal:   ?   Right shoulder: Normal.  ?   Left shoulder: Normal.  ?   Cervical back: Normal range of motion and neck supple. No crepitus. No pain with movement. Normal range of motion.  ?   Lumbar back: No swelling or tenderness. Normal range of motion. Negative right straight leg raise test and negative left straight leg raise test.  ?   Right knee: Crepitus present. No swelling, erythema or bony tenderness. Normal range of motion. Tenderness present over the medial joint line.  ?   Instability Tests: Posterior drawer test negative. Medial McMurray test negative and lateral McMurray test negative.  ?   Left knee: Normal.  ?   Right lower leg: Edema (trace) present.  ?  Left lower leg: Edema (trace) present.  ?Skin: ?   General: Skin is warm and dry.  ?Neurological:  ?   Mental Status: She is alert and oriented to person, place, and time.  ?Psychiatric:     ?   Attention and Perception: Attention normal.     ?   Mood and Affect: Mood normal.     ?   Speech: Speech normal.     ?   Behavior: Behavior normal. Behavior is cooperative.     ?   Thought Content: Thought content normal.  ? ?Results for orders placed or performed in visit on 12/04/21  ?Bayer DCA Hb A1c Waived  ?Result Value Ref Range  ? HB A1C (BAYER DCA - WAIVED) 5.4 4.8 - 5.6 %  ?Microalbumin, Urine Waived  ?Result Value Ref Range  ? Microalb, Ur Waived 30 (H) 0 - 19 mg/L  ? Creatinine, Urine Waived 50 10 - 300 mg/dL  ? Microalb/Creat Ratio 30-300 (H) <30 mg/g  ? ?   ?Assessment & Plan:  ? ?Problem List Items Addressed This Visit   ? ?  ? Cardiovascular and Mediastinum  ? Aortic atherosclerosis (HCC)  ?  Noted on CXR 04/16/21. Check lipid panel today.  Continue preventative statin and ASA. ?  ?  ? Relevant Medications  ? amLODipine (NORVASC) 10 MG tablet  ? spironolactone (ALDACTONE) 25 MG tablet  ? Other Relevant Orders  ? Comprehensive metabolic panel  ? Lipid Panel w/o Chol/HDL Ratio  ? Atherosclerotic heart disease of native coronary artery with other forms of  angina pectoris (HCC)  ?  Chronic, ongoing.  Continue current medication regimen and collaboration with cardiology.  No recent NTG use, will send in refill as needed. ?  ?  ? Relevant Medications  ? amLODipine (NORVASC) 10 MG tablet

## 2021-12-04 NOTE — Assessment & Plan Note (Signed)
BMI 43.38 with HTN, OLD MI, HLD. Recommended eating smaller high protein, low fat meals more frequently and exercising 30 mins a day 5 times a week with a goal of 10-15lb weight loss in the next 3 months. Patient voiced their understanding and motivation to adhere to these recommendations. ? ?

## 2021-12-04 NOTE — Assessment & Plan Note (Signed)
Chronic, ongoing.  Continue current medication regimen and adjust as needed.  Lipid panel today.  Have recommended she take this daily to help with prevention.  Discussed this at length with patient. 

## 2021-12-04 NOTE — Assessment & Plan Note (Signed)
Refer to morbid obesity plan of care. 

## 2021-12-05 LAB — COMPREHENSIVE METABOLIC PANEL
ALT: 17 IU/L (ref 0–32)
AST: 24 IU/L (ref 0–40)
Albumin/Globulin Ratio: 1.2 (ref 1.2–2.2)
Albumin: 4.3 g/dL (ref 3.7–4.7)
Alkaline Phosphatase: 69 IU/L (ref 44–121)
BUN/Creatinine Ratio: 12 (ref 12–28)
BUN: 10 mg/dL (ref 8–27)
Bilirubin Total: 0.6 mg/dL (ref 0.0–1.2)
CO2: 25 mmol/L (ref 20–29)
Calcium: 10 mg/dL (ref 8.7–10.3)
Chloride: 101 mmol/L (ref 96–106)
Creatinine, Ser: 0.81 mg/dL (ref 0.57–1.00)
Globulin, Total: 3.5 g/dL (ref 1.5–4.5)
Glucose: 130 mg/dL — ABNORMAL HIGH (ref 70–99)
Potassium: 3.6 mmol/L (ref 3.5–5.2)
Sodium: 143 mmol/L (ref 134–144)
Total Protein: 7.8 g/dL (ref 6.0–8.5)
eGFR: 77 mL/min/{1.73_m2} (ref >59)

## 2021-12-05 LAB — LIPID PANEL W/O CHOL/HDL RATIO
Cholesterol, Total: 174 mg/dL (ref 100–199)
HDL: 42 mg/dL (ref >39)
LDL Chol Calc (NIH): 110 mg/dL — ABNORMAL HIGH (ref 0–99)
Triglycerides: 124 mg/dL (ref 0–149)
VLDL Cholesterol Cal: 22 mg/dL (ref 5–40)

## 2021-12-05 LAB — TSH: TSH: 2.81 u[IU]/mL (ref 0.450–4.500)

## 2021-12-05 LAB — VITAMIN D 25 HYDROXY (VIT D DEFICIENCY, FRACTURES): Vit D, 25-Hydroxy: 41.6 ng/mL (ref 30.0–100.0)

## 2021-12-05 LAB — PTH, INTACT AND CALCIUM: PTH: 38 pg/mL (ref 15–65)

## 2021-12-05 NOTE — Progress Notes (Signed)
Contacted via [facility] ? ? ?Good morning Jennifer Grant, your labs have returned: ?- Kidney function, creatinine and eGFR, remains normal, as is liver function, AST and ALT.  Parathyroid lab is normal. ?- Vitamin D is in normal range.  Thyroid lab, TSH, is stable.   ?- Cholesterol labs show LDL above goal for heart attack prevention.  Are you taking Rosuvastatin daily?  This is very important for you to do.  Please take this medication every day to help prevent you from having a heart attack or stroke.  Any questions? ?Keep being stellar!!  Thank you for allowing me to participate in your care.  I appreciate you. ?Kindest regards, ?Riley Papin ?

## 2022-01-26 NOTE — Progress Notes (Signed)
Cardiology Office Note:    Date:  01/27/2022   ID:  Jennifer LimeValerie E Tu, DOB 07/05/1951, MRN [id number]  PCP:  Marjie Skiffannady, Jolene T, NP  Cardiologist:  Lesleigh NoeHenry W Anson Peddie III, MD   Referring MD: Marjie Skiffannady, Jolene T, NP   Chief Complaint  Patient presents with   Atrial Fibrillation   Coronary Artery Disease   Congestive Heart Failure    History of Present Illness:    Jennifer Grant is a 71 y.o. female with a hx of coronary artery disease (LCx, RCA, OM1 &2 RPDA 2004) and h/o stent thrombosis(RCA re-stented 07/2015), primary hypertension, hyperlipidemia, mitral valve prolapse, and chronic diastolic HF.   Recurring episodes of chest discomfort.  She has not been using nitroglycerin for this feeling of pressure.  She frequently feels "funny".  Is not uncommon for her to awaken from sleep feeling this way.  She does have diagnosed sleep apnea but never started on CPAP.  States that she got the facemask but the actual device never came.  She is having palpitations and fluttering in her chest.  She has dyspnea on exertion.  Past Medical History:  Diagnosis Date   CAD (coronary artery disease)    a. 2004 s/p MI w/ PCI/stenting to the LCX, OM1, OM2, RCA, RPDA; b. 05/2015 MI/PCI: 100 OM1 (2.75x20 & 3.0x8 Promus Premier DES'), PTCA OM2; c. 07/2015 Cath/Staged PCI: LM 60, LAD 75d, LCX 2632m ISR, OM1 ok, OM2 25 ISR, RCA 50p, 9247m/d, 85d (3.0x28 Promus Premier DES).   COVID 2 months ago   Diastolic dysfunction    a. 05/2015 Echo: EF 50-55%, severe lateral HK. Gr1 DD, mildly dil LA. Mildly reduced RV fxn.   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    "got booster shot a few times"   GERD (gastroesophageal reflux disease)    Hyperlipidemia    Hypertensive heart disease    Menopausal state    Morbid obesity (HCC)    Noncompliance    Sleep apnea     Past Surgical History:  Procedure Laterality Date   CARDIAC CATHETERIZATION N/A 06/08/2015   Procedure: Left Heart Cath and Coronary Angiography;  Surgeon: Lyn RecordsHenry W Mallika Sanmiguel, MD;   [facility];  Service: Cardiovascular;  Laterality: N/A;   CARDIAC CATHETERIZATION N/A 06/08/2015   Procedure: Coronary Stent Intervention;  Surgeon: Lyn RecordsHenry W Minard Millirons, MD;  [facility];  Service: Cardiovascular;  Laterality: N/A;   CARDIAC CATHETERIZATION N/A 07/16/2015   Procedure: Coronary Stent Intervention;  Surgeon: Lyn RecordsHenry W Mildred Tuccillo, MD;  [facility];  Service: Cardiovascular;  Laterality: N/A;   COLONOSCOPY N/A 02/17/2021   Procedure: COLONOSCOPY;  Surgeon: Midge MiniumWohl, Darren, MD;  Location: [facility] ENDOSCOPY;  Service: Endoscopy;  Laterality: N/A;   CORONARY ANGIOPLASTY WITH STENT PLACEMENT     "6 stents before 05/2015"   KNEE ARTHROSCOPY Left 1990's   TUBAL LIGATION  ~ 1978    Current Medications: Current Meds  Medication Sig   amLODipine (NORVASC) 10 MG tablet Take 1 tablet (10 mg total) by mouth daily.   aspirin EC 81 MG tablet Take 81 mg by mouth daily.   cholecalciferol (VITAMIN D3) 25 MCG (1000 UNIT) tablet Take 1,000 Units by mouth daily.   clopidogrel (PLAVIX) 75 MG tablet Take 1 tablet by mouth once daily   diclofenac Sodium (VOLTAREN) 1 % GEL Apply 2 g topically 4 (four) times daily. Apply to right knee   hydrochlorothiazide (HYDRODIURIL) 25 MG tablet Take 1 tablet by mouth once daily   isosorbide mononitrate (  IMDUR) 60 MG 24 hr tablet Take 2 tablets (120 mg total) by mouth daily.   lidocaine (LIDODERM) 5 % Place 1 patch onto the skin daily. Remove & Discard patch within 12 hours or as directed by MD   losartan (COZAAR) 100 MG tablet Take 1 tablet by mouth once daily   nitroGLYCERIN (NITROSTAT) 0.4 MG SL tablet DISSOLVE ONE TABLET UNDER THE TONGUE EVERY 5 MINUTES AS NEEDED FOR CHEST PAIN.  DO NOT EXCEED A TOTAL OF 3 DOSES IN 15 MINUTES   potassium chloride SA (KLOR-CON) 20 MEQ tablet Take 1 tablet by mouth once daily   rosuvastatin (CRESTOR) 40 MG tablet Take 1 tablet by mouth once daily   spironolactone (ALDACTONE) 25 MG tablet Take 0.5  tablets (12.5 mg total) by mouth daily.   UNABLE TO FIND Neo-Cell   [DISCONTINUED] isosorbide mononitrate (IMDUR) 60 MG 24 hr tablet Take 1 tablet by mouth once daily     Allergies:   Brilinta [ticagrelor], Lipitor [atorvastatin], Latex, Penicillins, and Tekturna [aliskiren]   Social History   Socioeconomic History   Marital status: Widowed    Spouse name: Not on file   Number of children: Not on file   Years of education: Not on file   Highest education level: 11th grade  Occupational History   Occupation: retired  Tobacco Use   Smoking status: Never   Smokeless tobacco: Never  Vaping Use   Vaping Use: Never used  Substance and Sexual Activity   Alcohol use: No   Drug use: No   Sexual activity: Not Currently    Birth control/protection: Surgical  Other Topics Concern   Not on file  Social History Narrative   Not on file   Social Determinants of Health   Financial Resource Strain: Medium Risk   Difficulty of Paying Living Expenses: Somewhat hard  Food Insecurity: Food Insecurity Present   Worried About Running Out of Food in the Last Year: Sometimes true   Ran Out of Food in the Last Year: Sometimes true  Transportation Needs: No Transportation Needs   Lack of Transportation (Medical): No   Lack of Transportation (Non-Medical): No  Physical Activity: Inactive   Days of Exercise per Week: 0 days   Minutes of Exercise per Session: 0 min  Stress: No Stress Concern Present   Feeling of Stress : Not at all  Social Connections: Not on file     Family History: The patient's family history includes Healthy in her brother and mother; Kidney disease in her father; Prostate cancer in her father. There is no history of Cancer, Diabetes, or Heart disease.  ROS:   Please see the history of present illness.    Some confusion about when to take her medications.  She has not had it today.  This needs to be done every morning.  She states there was some issue that occurred when  she had a colonoscopy where they told her "we almost lost you".  I cannot find any description of a problem during the procedure.  All other systems reviewed and are negative.  EKGs/Labs/Other Studies Reviewed:    The following studies were reviewed today: No recent cardiac imaging  EKG:  EKG no recent EKG.  Recent Labs: 06/08/2021: Hemoglobin 14.3; Platelets 242 12/04/2021: ALT 17; BUN 10; Creatinine, Ser 0.81; Potassium 3.6; Sodium 143; TSH 2.810  Recent Lipid Panel    Component Value Date/Time   CHOL 174 12/04/2021 1214   CHOL 206 (H) 12/28/2017 1356   TRIG 124  12/04/2021 1214   TRIG 136 12/28/2017 1356   HDL 42 12/04/2021 1214   CHOLHDL 4.5 (H) 08/09/2018 1521   CHOLHDL 3.8 07/16/2015 0733   VLDL 27 12/28/2017 1356   LDLCALC 110 (H) 12/04/2021 1214    Physical Exam:    VS:  BP (!) 178/98   Pulse 66   Ht 5\' 2"  (1.575 m)   Wt 237 lb 3.2 oz (107.6 kg)   LMP  (LMP Unknown)   SpO2 95%   BMI 43.38 kg/m     Wt Readings from Last 3 Encounters:  01/27/22 237 lb 3.2 oz (107.6 kg)  12/04/21 237 lb 3.2 oz (107.6 kg)  10/05/21 232 lb 12.8 oz (105.6 kg)     GEN: Morbid obesity. No acute distress HEENT: Normal NECK: No JVD. LYMPHATICS: No lymphadenopathy CARDIAC: 2/6 right upper sternal systolic murmur. RRR no gallop, bilateral ankle edema. VASCULAR:  Normal Pulses. No bruits. RESPIRATORY:  Clear to auscultation without rales, wheezing or rhonchi  ABDOMEN: Soft, non-tender, non-distended, No pulsatile mass, MUSCULOSKELETAL: No deformity  SKIN: Warm and dry NEUROLOGIC:  Alert and oriented x 3 PSYCHIATRIC:  Normal affect   ASSESSMENT:    1. 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 Essential hypertension   3. Chronic diastolic heart failure (HCC)   4. Hyperlipidemia, unspecified hyperlipidemia type   5. Paroxysmal atrial fibrillation (HCC)   6. Chest tightness    PLAN:    In order of problems listed above:  He  is having frequent chest tightness.  Increase Imdur to 120 mg/day.  Use nitroglycerin liberally if chest tightness.  Lexiscan myocardial perfusion imaging.  2D Doppler echocardiogram to assess LV function. Has not taken the medication yet today.  Target 130/80.  She should take blood pressure medicines every morning. 2D Doppler echocardiogram to assess systolic function and diastolic function.   Continue statin therapy. No recent episodes although she does complain of palpitations.  She needs to wear a 2-week monitor because of palpitations to rule out unrecognized A-fib.  52-month follow-up.  Use nitroglycerin liberally.  Plan is continuous monitoring, echo, and nuclear scintigraphy.  Medication Adjustments/Labs and Tests Ordered: Current medicines are reviewed at length with the patient today.  Concerns regarding medicines are outlined above.  Orders Placed This Encounter  Procedures   LONG TERM MONITOR (3-14 DAYS)   MYOCARDIAL PERFUSION IMAGING   ECHOCARDIOGRAM COMPLETE   Meds ordered this encounter  Medications   isosorbide mononitrate (IMDUR) 60 MG 24 hr tablet    Sig: Take 2 tablets (120 mg total) by mouth daily.    Dispense:  180 tablet    Refill:  3    Dose change    Patient Instructions  Medication Instructions:  Your physician has recommended you make the following change in your medication:   1) INCREASE Isosorbide Mononitrate (Imdur) to 120mg  daily  **Also, use your nitroglycerin when your chest feels tight or heavy. Please be sure to take your regular medication every morning.**  *If you need a refill on your cardiac medications before your next appointment, please call your pharmacy*   Lab Work: NONE  Testing/Procedures: Your physician has requested that you have a lexiscan myoview. For further information please visit 2-month. Please follow instruction sheet, as given.   Your physician has requested that you have an echocardiogram. Echocardiography  is a painless test that uses sound waves to create images of your heart. It provides your doctor with information about  the size and shape of your heart and how well your heart's chambers and valves are working. This procedure takes approximately one hour. There are no restrictions for this procedure.  Your physician has requested that you wear a Zio heart monitor for 14 days.  Follow-Up: At [facility], you and your health needs are our priority.  As part of our continuing mission to provide you with exceptional heart care, we have created designated Provider Care Teams.  These Care Teams include your primary Cardiologist (physician) and Advanced Practice Providers (APPs -  Physician Assistants and Nurse Practitioners) who all work together to provide you with the care you need, when you need it.  Your next appointment:   3 month(s)  The format for your next appointment:   In Person  Provider:   Lesleigh Noe, MD {  Other Instructions: Christena Deem- Long Term Monitor Instructions  Your physician has requested you wear a ZIO patch monitor for 14 days.  This is a single patch monitor. [employer] supplies one patch monitor per enrollment. Additional stickers are not available. Please do not apply patch if you will be having a Nuclear Stress Test,  Echocardiogram, Cardiac CT, MRI, or Chest Xray during the period you would be wearing the  monitor. The patch cannot be worn during these tests. You cannot remove and re-apply the  ZIO XT patch monitor.  Your ZIO patch monitor will be mailed 3 day USPS to your address on file. It may take 3-5 days  to receive your monitor after you have been enrolled.  Once you have received your monitor, please review the enclosed instructions. Your monitor  has already been registered assigning a specific monitor serial # to you.  Billing and Patient Assistance Program Information  We have supplied [employer] with any of your insurance information on file for  billing purposes. [employer] offers a sliding scale Patient Assistance Program for patients that do not have  insurance, or whose insurance does not completely cover the cost of the ZIO monitor.  You must apply for the Patient Assistance Program to qualify for this discounted rate.  To apply, please call [employer] at [phone], select option 4, select option 2, ask to apply for  Patient Assistance Program. Meredeth Ide will ask your household income, and how many people  are in your household. They will quote your out-of-pocket cost based on that information.  [employer] will also be able to set up a 47-month, interest-free payment plan if needed.  Applying the monitor   Shave hair from upper left chest.  Hold abrader disc by orange tab. Rub abrader in 40 strokes over the upper left chest as  indicated in your monitor instructions.  Clean area with 4 enclosed alcohol pads. Let dry.  Apply patch as indicated in monitor instructions. Patch will be placed under collarbone on left  side of chest with arrow pointing upward.  Rub patch adhesive wings for 2 minutes. Remove white label marked "1". Remove the white  label marked "2". Rub patch adhesive wings for 2 additional minutes.  While looking in a mirror, press and release button in center of patch. A small green light will  flash 3-4 times. This will be your only indicator that the monitor has been turned on.  Do not shower for the first 24 hours. You may shower after the first 24 hours.  Press the button if you feel a symptom. You will hear a small click. Record Date, Time and  Symptom in  the Patient Logbook.  When you are ready to remove the patch, follow instructions on the last 2 pages of Patient  Logbook. Stick patch monitor onto the last page of Patient Logbook.  Place Patient Logbook in the blue and white box. Use locking tab on box and tape box closed  securely. The blue and white box has prepaid postage on it. Please place it in the mailbox  as  soon as possible. Your physician should have your test results approximately 7 days after the  monitor has been mailed back to [facility].  Call [facility] Customer Care at [phone] if you have questions regarding  your ZIO XT patch monitor. Call them immediately if you see an orange light blinking on your  monitor.  If your monitor falls off in less than 4 days, contact our Monitor department at [phone].  If your monitor becomes loose or falls off after 4 days call [employer] at [phone] for  suggestions on securing your monitor   Important Information About Sugar         Signed, Lesleigh Noe, MD  01/27/2022 10:30 AM    [facility]

## 2022-01-27 ENCOUNTER — Encounter: Payer: Self-pay | Admitting: Interventional Cardiology

## 2022-01-27 ENCOUNTER — Ambulatory Visit (INDEPENDENT_AMBULATORY_CARE_PROVIDER_SITE_OTHER): Payer: Medicare Other

## 2022-01-27 ENCOUNTER — Ambulatory Visit (INDEPENDENT_AMBULATORY_CARE_PROVIDER_SITE_OTHER): Payer: Medicare Other | Admitting: Interventional Cardiology

## 2022-01-27 VITALS — BP 178/98 | HR 66 | Ht 62.0 in | Wt 237.2 lb

## 2022-01-27 DIAGNOSIS — I10 Essential (primary) hypertension: Secondary | ICD-10-CM | POA: Diagnosis not present

## 2022-01-27 DIAGNOSIS — I5032 Chronic diastolic (congestive) heart failure: Secondary | ICD-10-CM | POA: Diagnosis not present

## 2022-01-27 DIAGNOSIS — R0789 Other chest pain: Secondary | ICD-10-CM

## 2022-01-27 DIAGNOSIS — I25118 Atherosclerotic heart disease of native coronary artery with other forms of angina pectoris: Secondary | ICD-10-CM | POA: Diagnosis not present

## 2022-01-27 DIAGNOSIS — I480 Paroxysmal atrial fibrillation: Secondary | ICD-10-CM

## 2022-01-27 DIAGNOSIS — E785 Hyperlipidemia, unspecified: Secondary | ICD-10-CM

## 2022-01-27 MED ORDER — ISOSORBIDE MONONITRATE ER 60 MG PO TB24: 120.0000 mg | ORAL_TABLET | Freq: Every day | ORAL | 3 refills | Status: DC

## 2022-01-27 NOTE — Progress Notes (Unsigned)
Applied a 14 day Zio XT monitor to patient in the office ?

## 2022-01-27 NOTE — Patient Instructions (Addendum)
Medication Instructions:  Your physician has recommended you make the following change in your medication:   1) INCREASE Isosorbide Mononitrate (Imdur) to 120mg  daily  **Also, use your nitroglycerin when your chest feels tight or heavy. Please be sure to take your regular medication every morning.**  *If you need a refill on your cardiac medications before your next appointment, please call your pharmacy*   Lab Work: NONE  Testing/Procedures: Your physician has requested that you have a lexiscan myoview. For further information please visit . Please follow instruction sheet, as given.   Your physician has requested that you have an echocardiogram. Echocardiography is a painless test that uses sound waves to create images of your heart. It provides your doctor with information about the size and shape of your heart and how well your heart's chambers and valves are working. This procedure takes approximately one hour. There are no restrictions for this procedure.  Your physician has requested that you wear a Zio heart monitor for 14 days.  Follow-Up: At [facility], you and your health needs are our priority.  As part of our continuing mission to provide you with exceptional heart care, we have created designated Provider Care Teams.  These Care Teams include your primary Cardiologist (physician) and Advanced Practice Providers (APPs -  Physician Assistants and Nurse Practitioners) who all work together to provide you with the care you need, when you need it.  Your next appointment:   3 month(s)  The format for your next appointment:   In Person  Provider:   [facility], MD {  Other Instructions: Lesleigh Noe- Long Term Monitor Instructions  Your physician has requested you wear a ZIO patch monitor for 14 days.  This is a single patch monitor. [employer] supplies one patch monitor per enrollment. Additional stickers are not available. Please do not apply patch if  you will be having a Nuclear Stress Test,  Echocardiogram, Cardiac CT, MRI, or Chest Xray during the period you would be wearing the  monitor. The patch cannot be worn during these tests. You cannot remove and re-apply the  ZIO XT patch monitor.  Your ZIO patch monitor will be mailed 3 day USPS to your address on file. It may take 3-5 days  to receive your monitor after you have been enrolled.  Once you have received your monitor, please review the enclosed instructions. Your monitor  has already been registered assigning a specific monitor serial # to you.  Billing and Patient Assistance Program Information  We have supplied [employer] with any of your insurance information on file for billing purposes. [employer] offers a sliding scale Patient Assistance Program for patients that do not have  insurance, or whose insurance does not completely cover the cost of the ZIO monitor.  You must apply for the Patient Assistance Program to qualify for this discounted rate.  To apply, please call [employer] at [phone], select option 4, select option 2, ask to apply for  Patient Assistance Program. [phone] will ask your household income, and how many people  are in your household. They will quote your out-of-pocket cost based on that information.  [employer] will also be able to set up a 26-month, interest-free payment plan if needed.  Applying the monitor   Shave hair from upper left chest.  Hold abrader disc by orange tab. Rub abrader in 40 strokes over the upper left chest as  indicated in your monitor instructions.  Clean area with 4 enclosed alcohol pads. Let dry.  Apply patch as indicated in monitor instructions. Patch will be placed under collarbone on left  side of chest with arrow pointing upward.  Rub patch adhesive wings for 2 minutes. Remove white label marked "1". Remove the white  label marked "2". Rub patch adhesive wings for 2 additional minutes.  While looking in a mirror, press and  release button in center of patch. A small green light will  flash 3-4 times. This will be your only indicator that the monitor has been turned on.  Do not shower for the first 24 hours. You may shower after the first 24 hours.  Press the button if you feel a symptom. You will hear a small click. Record Date, Time and  Symptom in the Patient Logbook.  When you are ready to remove the patch, follow instructions on the last 2 pages of Patient  Logbook. Stick patch monitor onto the last page of Patient Logbook.  Place Patient Logbook in the blue and white box. Use locking tab on box and tape box closed  securely. The blue and white box has prepaid postage on it. Please place it in the mailbox as  soon as possible. Your physician should have your test results approximately 7 days after the  monitor has been mailed back to [facility].  Call [facility] Customer Care at [phone] if you have questions regarding  your ZIO XT patch monitor. Call them immediately if you see an orange light blinking on your  monitor.  If your monitor falls off in less than 4 days, contact our Monitor department at [phone].  If your monitor becomes loose or falls off after 4 days call [employer] at [phone] for  suggestions on securing your monitor   Important Information About Sugar

## 2022-02-09 ENCOUNTER — Telehealth (HOSPITAL_COMMUNITY): Payer: Self-pay | Admitting: *Deleted

## 2022-02-09 NOTE — Telephone Encounter (Signed)
Patient given detailed instructions per Myocardial Perfusion Study Information Sheet for the test on 02/17/22 at 1015. Patient notified to arrive 15 minutes early and that it is imperative to arrive on time for appointment to keep from having the test rescheduled.  If you need to cancel or reschedule your appointment, please call the office within 24 hours of your appointment. . Patient verbalized understanding.Dakin Madani, Adelene Idler

## 2022-02-16 DIAGNOSIS — I480 Paroxysmal atrial fibrillation: Secondary | ICD-10-CM | POA: Diagnosis not present

## 2022-02-17 ENCOUNTER — Ambulatory Visit (HOSPITAL_COMMUNITY): Payer: Medicare Other | Attending: Cardiology

## 2022-02-17 ENCOUNTER — Ambulatory Visit (HOSPITAL_BASED_OUTPATIENT_CLINIC_OR_DEPARTMENT_OTHER): Payer: Medicare Other

## 2022-02-17 DIAGNOSIS — R0789 Other chest pain: Secondary | ICD-10-CM

## 2022-02-17 DIAGNOSIS — I441 Atrioventricular block, second degree: Secondary | ICD-10-CM

## 2022-02-17 HISTORY — DX: Atrioventricular block, second degree: I44.1

## 2022-02-17 LAB — ECHOCARDIOGRAM COMPLETE
AR max vel: 2.19 cm2
AV Area VTI: 2.25 cm2
AV Area mean vel: 2.07 cm2
AV Mean grad: 8.4 mmHg
AV Peak grad: 14.7 mmHg
Ao pk vel: 1.91 m/s
Area-P 1/2: 2.74 cm2
Height: 62 in
S' Lateral: 3.2 cm
Weight: 3792 oz

## 2022-02-17 MED ORDER — PERFLUTREN LIPID MICROSPHERE
1.0000 mL | INTRAVENOUS | Status: AC | PRN
  Administered 2022-02-17: 2 mL via INTRAVENOUS

## 2022-02-17 MED ORDER — REGADENOSON 0.4 MG/5ML IV SOLN
0.4000 mg | Freq: Once | INTRAVENOUS | Status: AC
  Administered 2022-02-17: 0.4 mg via INTRAVENOUS

## 2022-02-17 MED ORDER — TECHNETIUM TC 99M TETROFOSMIN IV KIT
31.1000 | PACK | Freq: Once | INTRAVENOUS | Status: AC | PRN
  Administered 2022-02-17: 31.1 via INTRAVENOUS

## 2022-02-18 ENCOUNTER — Encounter (HOSPITAL_COMMUNITY): Payer: Medicare Other | Attending: Nurse Practitioner

## 2022-02-18 LAB — MYOCARDIAL PERFUSION IMAGING
LV dias vol: 72 mL (ref 46–106)
LV sys vol: 38 mL
Nuc Stress EF: 47 %
Peak HR: 99 {beats}/min
Rest HR: 78 {beats}/min
Rest Nuclear Isotope Dose: 31.2 mCi
SDS: 0
SRS: 10
SSS: 10
ST Depression (mm): 0 mm
Stress Nuclear Isotope Dose: 31.1 mCi
TID: 0.86

## 2022-02-18 MED ORDER — TECHNETIUM TC 99M TETROFOSMIN IV KIT
31.1000 | PACK | Freq: Once | INTRAVENOUS | Status: AC | PRN
  Administered 2022-02-18: 31.1 via INTRAVENOUS
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
## 2022-03-16 ENCOUNTER — Other Ambulatory Visit: Payer: Self-pay | Admitting: Nurse Practitioner

## 2022-03-16 DIAGNOSIS — I2510 Atherosclerotic heart disease of native coronary artery without angina pectoris: Secondary | ICD-10-CM

## 2022-03-16 DIAGNOSIS — I10 Essential (primary) hypertension: Secondary | ICD-10-CM

## 2022-03-17 ENCOUNTER — Telehealth: Payer: Self-pay

## 2022-03-17 NOTE — Telephone Encounter (Signed)
Called patient to gather more information from prior call. Unable to left a voice message as the phone gave me an error message.   OK for PEC/Nurse Triage to gather more information if patient calls back.

## 2022-03-17 NOTE — Telephone Encounter (Signed)
Copied from CRM [id number]. Topic: General - Inquiry >> Mar 17, 2022  1:21 PM Lyman Speller wrote: Reason for CRM: Pt would like a call from [facility] about her cardiology report/ please advise

## 2022-03-17 NOTE — Telephone Encounter (Signed)
Requested Prescriptions  Pending Prescriptions Disp Refills  . losartan (COZAAR) 100 MG tablet [Pharmacy Med Name: Losartan Potassium 100 MG Oral Tablet] 90 tablet 1    Sig: Take 1 tablet by mouth once daily     Cardiovascular:  Angiotensin Receptor Blockers Failed - 03/16/2022  2:23 PM      Failed - Last BP in normal range    BP Readings from Last 1 Encounters:  01/27/22 (!) 178/98         Passed - Cr in normal range and within 180 days    Creat  Date Value Ref Range Status  07/23/2015 0.70 0.50 - 0.99 mg/dL Final   Creatinine, Ser  Date Value Ref Range Status  12/04/2021 0.81 0.57 - 1.00 mg/dL Final         Passed - K in normal range and within 180 days    Potassium  Date Value Ref Range Status  12/04/2021 3.6 3.5 - 5.2 mmol/L Final         Passed - Patient is not pregnant      Passed - Valid encounter within last 6 months    Recent Outpatient Visits          3 months ago Chronic diastolic heart failure (HCC)   [facility] [place], Jolene T, NP   5 months ago Chronic right shoulder pain   [facility] [place], Dorie Rank, NP   7 months ago Essential hypertension   [facility] [place], Lauren A, NP   9 months ago Neck pain   [facility] [place], Lauren A, NP   10 months ago Neck pain   [facility] [place], Corrie Dandy T, NP      Future Appointments            In 1 month Katrinka Blazing, Barry Dienes, MD [facility] [employer], [facility]   In 1 month  [facility], [facility]   In 2 months [employer], Dorie Rank, NP [employer], [facility]           . rosuvastatin (CRESTOR) 40 MG tablet [Pharmacy Med Name: Rosuvastatin Calcium 40 MG Oral Tablet] 90 tablet 1    Sig: Take 1 tablet by mouth once daily     Cardiovascular:  Antilipid - Statins 2 Failed - 03/16/2022  2:23 PM      Failed - Lipid Panel in normal range within the last 12 months    Cholesterol, Total  Date Value Ref Range Status  12/04/2021  174 100 - 199 mg/dL Final   Cholesterol Piccolo, Waived  Date Value Ref Range Status  12/28/2017 206 (H) <200 mg/dL Final    Comment:                            Desirable                <200                         Borderline High      200- 239                         High                     >239    LDL Chol Calc (NIH)  Date Value Ref Range Status  12/04/2021 110 (H)  0 - 99 mg/dL Final   HDL  Date Value Ref Range Status  12/04/2021 42 >39 mg/dL Final   Triglycerides  Date Value Ref Range Status  12/04/2021 124 0 - 149 mg/dL Final   Triglycerides Piccolo,Waived  Date Value Ref Range Status  12/28/2017 136 <150 mg/dL Final    Comment:                            Normal                   <150                         Borderline High     150 - 199                         High                200 - 499                         Very High                >499          Passed - Cr in normal range and within 360 days    Creat  Date Value Ref Range Status  07/23/2015 0.70 0.50 - 0.99 mg/dL Final   Creatinine, Ser  Date Value Ref Range Status  12/04/2021 0.81 0.57 - 1.00 mg/dL Final         Passed - Patient is not pregnant      Passed - Valid encounter within last 12 months    Recent Outpatient Visits          3 months ago Chronic diastolic heart failure (HCC)   [facility] [place], Jolene T, NP   5 months ago Chronic right shoulder pain   [facility] [employer], [place] T, NP   7 months ago Essential hypertension   [facility] [place], Lauren A, NP   9 months ago Neck pain   [facility] [place], Lauren A, NP   10 months ago Neck pain   [facility] [place], Corrie Dandy T, NP      Future Appointments            In 1 month Katrinka Blazing, Barry Dienes, MD [facility] [employer], [facility]   In 1 month  [facility], [facility]   In 2 months [place], Ray T, NP [facility], [facility]           .  nitroGLYCERIN (NITROSTAT) 0.4 MG SL tablet [employer] Med Name: Nitroglycerin 0.4 MG Sublingual Tablet Sublingual] 25 tablet 0    Sig: DISSOLVE ONE TABLET UNDER THE TONGUE EVERY 5 MINUTES AS NEEDED FOR CHEST PAIN.  DO NOT EXCEED A TOTAL OF 3 DOSES IN 15 MINUTES     Cardiovascular:  Nitrates Failed - 03/16/2022  2:23 PM      Failed - Last BP in normal range    BP Readings from Last 1 Encounters:  01/27/22 (!) 178/98         Passed - Last Heart Rate in normal range    Pulse Readings from Last 1 Encounters:  01/27/22 66         Passed - Valid encounter within last 12 months  Recent Outpatient Visits          3 months ago Chronic diastolic heart failure (HCC)   [facility] [place], Limon T, NP   5 months ago Chronic right shoulder pain   [facility] [place], Dorie Rank, NP   7 months ago Essential hypertension   [facility] [place], Lauren A, NP   9 months ago Neck pain   [facility] [place], Lauren A, NP   10 months ago Neck pain   [facility] Marjie Skiff, NP      Future Appointments            In 1 month Katrinka Blazing, Barry Dienes, MD [facility] [employer], [facility]   In 1 month  [facility], [facility]   In 2 months [employer], Dorie Rank, NP [facility], [facility]

## 2022-03-22 NOTE — Telephone Encounter (Signed)
Called patient to make her aware of Jolene's recommendations. Unable to leave message for patient as there was an error message stating "call cannot be completed at this time."  OK for PEC/Nurse Triage to give Jolene's note from previous call if patient calls back.

## 2022-03-22 NOTE — Telephone Encounter (Signed)
Copied from CRM [id number]. Topic: General - Inquiry >> Mar 17, 2022  1:21 PM Lyman Speller wrote: Reason for CRM: Pt would like a call from [facility] about her cardiology report/ please advise >> Mar 22, 2022 12:24 PM Teressa P wrote: Pt called back about her test results.  She said she called last week and still has not received a call back.

## 2022-03-23 ENCOUNTER — Telehealth: Payer: Self-pay | Admitting: Nurse Practitioner

## 2022-03-23 ENCOUNTER — Ambulatory Visit: Payer: Self-pay

## 2022-03-23 NOTE — Telephone Encounter (Signed)
  Chief Complaint: Blood in mouth in the morning Symptoms: Pt spits out blood in the morning Frequency: 1year - a lot more today Pertinent Negatives: Patient denies pain Disposition: [] ED /[] Urgent Care (no appt availability in office) / [x] Appointment(In office/virtual)/ []  [facility]/ [] Home Care/ [] Refused Recommended Disposition /[] [facility]/ []  Follow-up with PCP Additional Notes: PT states that she has been spitting out blood for 1 year in the morning. Pt states that this morning it was a lot more than usual. Pt states that she and PCP spoke about this at last appt. PCP looked into her mouth but saw no cause for blood. Pt is unsure what is causing this. She talks about a filling in a tooth falling out, and a tooth pulled out and stuck in throat as possible causes. Pt states that she has blood in her mouth in the morning that she spits out prior to brushing her teeth. Pt has also requesting requested a referral for ENT.  Reason for Disposition  Bleeding from mouth is a chronic symptom (recurrent or ongoing AND present > 4 weeks)  Answer Assessment - Initial Assessment Questions 1. SYMPTOM: "What's the main symptom you're concerned about?" (e.g., chapped lips, dry mouth, lump, sores)     Bleeding 2. ONSET: "When did the  bleeding  start?"     1 year - not everyday 3. PAIN: "Is there any pain?" If Yes, ask: "How bad is it?" (Scale: 1-10; mild, moderate, severe)   - MILD (1-3):  doesn't interfere with eating or normal activities   - MODERATE (4-7): interferes with eating some solids and normal activities   - SEVERE (8-10):  excruciating pain, interferes with most normal activities   - SEVERE DYSPHAGIA: can't swallow liquids, drooling     no 4. CAUSE: "What do you think is causing the symptoms?"     Filling came out of tooth 3 months ago 5. OTHER SYMPTOMS: "Do you have any other symptoms?" (e.g., fever, sore throat, toothache, swelling)     no 6. PREGNANCY: "Is  there any chance you are pregnant?" "When was your last menstrual period?"     no  Protocols used: Mouth Symptoms-A-AH

## 2022-03-23 NOTE — Telephone Encounter (Signed)
Referral Request - Has patient seen PCP for this complaint? yes *If NO, is insurance requiring patient see PCP for this issue before PCP can refer them? Referral for which specialty: throat DR Preferred provider/office: N?A Reason for referral: voice hoarse/says spit up blood, thinks its coming from her throat

## 2022-03-24 NOTE — Telephone Encounter (Signed)
Attempted to call patient twice to provide her with Jolene's recommendations. Unable to leave message as there was an error message when calling patient.   OK for PEC/Nurse Triage to give note if patient calls back.

## 2022-03-25 ENCOUNTER — Ambulatory Visit (INDEPENDENT_AMBULATORY_CARE_PROVIDER_SITE_OTHER): Payer: Medicare Other | Admitting: Nurse Practitioner

## 2022-03-25 ENCOUNTER — Encounter: Payer: Self-pay | Admitting: Nurse Practitioner

## 2022-03-25 DIAGNOSIS — I10 Essential (primary) hypertension: Secondary | ICD-10-CM | POA: Diagnosis not present

## 2022-03-25 DIAGNOSIS — K219 Gastro-esophageal reflux disease without esophagitis: Secondary | ICD-10-CM | POA: Diagnosis not present

## 2022-03-25 DIAGNOSIS — K5901 Slow transit constipation: Secondary | ICD-10-CM

## 2022-03-25 DIAGNOSIS — I5032 Chronic diastolic (congestive) heart failure: Secondary | ICD-10-CM

## 2022-03-25 DIAGNOSIS — E782 Mixed hyperlipidemia: Secondary | ICD-10-CM

## 2022-03-25 DIAGNOSIS — K5900 Constipation, unspecified: Secondary | ICD-10-CM | POA: Insufficient documentation

## 2022-03-25 MED ORDER — ISOSORBIDE MONONITRATE ER 60 MG PO TB24: 60.0000 mg | ORAL_TABLET | Freq: Every day | ORAL | 4 refills | Status: DC

## 2022-03-25 MED ORDER — FAMOTIDINE 20 MG PO TABS: 20.0000 mg | ORAL_TABLET | Freq: Two times a day (BID) | ORAL | 4 refills | Status: DC

## 2022-03-25 NOTE — Assessment & Plan Note (Signed)
Chronic, ongoing.  Continue current medication regimen and adjust as needed.  Lipid panel up to date.  Have recommended she take this daily to help with prevention.  Discussed this at length with patient.

## 2022-03-25 NOTE — Assessment & Plan Note (Signed)
Chronic, ongoing.  Euvolemic on exam today.  Continue current medication regimen and collaboration with cardiology.  Discussed diet choices and to cut back on sodium intake, she eats a higher sodium diet with lots of spicier foods.   - Reminded to call for an overnight weight gain of >2 pounds or a weekly weight weight of >5 pounds - not adding salt to his food and has been reading food labels. Reviewed the importance of keeping daily sodium intake to <2000mg daily  - No Ibuprofen products 

## 2022-03-25 NOTE — Patient Instructions (Addendum)
METAMUCIL EVERY DAY + TAKE COLACE 100 MG TWICE A DAY CONSISTENTLY TO HELP PASS BOWELS.  Plus get lots of green leafy vegetables and prune juice in diet.  Food Choices for Gastroesophageal Reflux Disease, Adult When you have gastroesophageal reflux disease (GERD), the foods you eat and your eating habits are very important. Choosing the right foods can help ease your discomfort. Think about working with a food expert (dietitian) to help you make good choices. What are tips for following this plan? Reading food labels Look for foods that are low in saturated fat. Foods that may help with your symptoms include: Foods that have less than 5% of daily value (DV) of fat. Foods that have 0 grams of trans fat. Cooking Do not fry your food. Cook your food by baking, steaming, grilling, or broiling. These are all methods that do not need a lot of fat for cooking. To add flavor, try to use herbs that are low in spice and acidity. Meal planning  Choose healthy foods that are low in fat, such as: Fruits and vegetables. Whole grains. Low-fat dairy products. Lean meats, fish, and poultry. Eat small meals often instead of eating 3 large meals each day. Eat your meals slowly in a place where you are relaxed. Avoid bending over or lying down until 2-3 hours after eating. Limit high-fat foods such as fatty meats or fried foods. Limit your intake of fatty foods, such as oils, butter, and shortening. Avoid the following as told by your doctor: Foods that cause symptoms. These may be different for different people. Keep a food diary to keep track of foods that cause symptoms. Alcohol. Drinking a lot of liquid with meals. Eating meals during the 2-3 hours before bed. Lifestyle Stay at a healthy weight. Ask your doctor what weight is healthy for you. If you need to lose weight, work with your doctor to do so safely. Exercise for at least 30 minutes on 5 or more days each week, or as told by your doctor. Wear  loose-fitting clothes. Do not smoke or use any products that contain nicotine or tobacco. If you need help quitting, ask your doctor. Sleep with the head of your bed higher than your feet. Use a wedge under the mattress or blocks under the bed frame to raise the head of the bed. Chew sugar-free gum after meals. What foods should eat?  Eat a healthy, well-balanced diet of fruits, vegetables, whole grains, low-fat dairy products, lean meats, fish, and poultry. Each person is different. Foods that may cause symptoms in one person may not cause any symptoms in another person. Work with your doctor to find foods that are safe for you. The items listed above may not be a complete list of what you can eat and drink. Contact a food expert for more options. What foods should I avoid? Limiting some of these foods may help in managing the symptoms of GERD. Everyone is different. Talk with a food expert or your doctor to help you find the exact foods to avoid, if any. Fruits Any fruits prepared with added fat. Any fruits that cause symptoms. For some people, this may include citrus fruits, such as oranges, grapefruit, pineapple, and lemons. Vegetables Deep-fried vegetables. [place] fries. Any vegetables prepared with added fat. Any vegetables that cause symptoms. For some people, this may include tomatoes and tomato products, chili peppers, onions and garlic, and horseradish. Grains Pastries or quick breads with added fat. Meats and other proteins High-fat meats, such as fatty  beef or pork, hot dogs, ribs, ham, sausage, salami, and bacon. Fried meat or protein, including fried fish and fried chicken. Nuts and nut butters, in large amounts. Dairy Whole milk and chocolate milk. Sour cream. Cream. Ice cream. Cream cheese. Milkshakes. Fats and oils Butter. Margarine. Shortening. Ghee. Beverages Coffee and tea, with or without caffeine. Carbonated beverages. Sodas. Energy drinks. Fruit juice made with acidic  fruits, such as orange or grapefruit. Tomato juice. Alcoholic drinks. Sweets and desserts Chocolate and cocoa. Donuts. Seasonings and condiments Pepper. Peppermint and spearmint. Added salt. Any condiments, herbs, or seasonings that cause symptoms. For some people, this may include curry, hot sauce, or vinegar-based salad dressings. The items listed above may not be a complete list of what you should not eat and drink. Contact a food expert for more options. Questions to ask your doctor Diet and lifestyle changes are often the first steps that are taken to manage symptoms of GERD. If diet and lifestyle changes do not help, talk with your doctor about taking medicines. Where to find more information International Foundation for Gastrointestinal Disorders: aboutgerd.org Summary When you have GERD, food and lifestyle choices are very important in easing your symptoms. Eat small meals often instead of 3 large meals a day. Eat your meals slowly and in a place where you are relaxed. Avoid bending over or lying down until 2-3 hours after eating. Limit high-fat foods such as fatty meats or fried foods. This information is not intended to replace advice given to you by your health care provider. Make sure you discuss any questions you have with your health care provider. Document Revised: 02/25/2020 Document Reviewed: 02/25/2020 Elsevier Patient Education  2023 [employer].

## 2022-03-25 NOTE — Telephone Encounter (Signed)
Patient is being seen in office today, and can discuss with provider today.

## 2022-03-25 NOTE — Progress Notes (Signed)
BP (!) 146/84 (BP Location: Left Arm)   Pulse 70   Temp 98 F (36.7 C) (Oral)   Wt 237 lb 14.4 oz (107.9 kg)   LMP  (LMP Unknown)   SpO2 99%   BMI 43.51 kg/m    Subjective:    Patient ID: Jennifer Grant, female    DOB: Sep 28, 1950, 71 y.o.   MRN: [id number]  HPI: Jennifer Grant is a 71 y.o. female  Chief Complaint  Patient presents with   Sore Throat    Patient says when she got up morning before yesterday, she noticed dark blood mixed in her with her mucus. Patient says it was just that one day that she noticed and has not noticed it anymore. Patient says it happens every so often. Patient says this morning her throat was bitter.   Medication Management    Patient says when she takes her prescription of Imdur, she was prescribed to take 2 tablets and she is only taking 1 tablet, as the 2 tablets cause her issues with her feet. Patient says the doctor told her to cut back.    HYPERTENSION / HYPERLIPIDEMIA/HF Currently taking Losartan, Amlodipine, HCTZ, Isosorbide Mono (they increased this to 2 tablets recent visit with cardiology, but this bothered her feet so she is taking 1 tablet), Plavix, and ASA + Rosuvastatin  Last saw cardiology on 01/27/22.  Has history of sleep study noting OSA, but she does not wear CPAP.  No recent NTG use.  She does continue to endorse occasional poor diet choices -- eats lots of spicy food.     June 2023 echo EF 50-55%, chronic diastolic HF diagnosis on chart. History of MI in 2004.   Satisfied with current treatment? yes Duration of hypertension: chronic BP monitoring frequency: rarely BP range: 130/80 BP medication side effects: no Duration of hyperlipidemia: chronic Cholesterol medication side effects: no Cholesterol supplements: none Medication compliance: good compliance Aspirin: yes Recent stressors: no Recurrent headaches: no Visual changes: no Palpitations: no Dyspnea: none Chest pain: no Lower extremity edema:  no Dizzy/lightheaded: no  The ASCVD Risk score (Arnett DK, et al., 2019) failed to calculate for the following reasons:   The patient has a prior MI or stroke diagnosis  THROAT ISSUES Started to have issues after they drilled her tooth at dentist, then the chipped tooth came off and this went in her throat which has caused hoarseness and occasional pink tinge in spit in morning.  Her stomach feels bloated at times.  Throat was bitter when she woke-up this morning.  Does feel like burning in throat at times, takes lemon juice for this.    Her stomach feels hot inside, feels her BP is high because she is having some constipation.  Does not notice this stomach heat all the time.  At times goes 4 days without a BM.  Does strain to get BM out.  Reports getting good fluid intake at home.  Not eating much in green leafy vegetables or prunes. Fever: no Cough:  two weeks ago had a dry cough Shortness of breath: no Wheezing:  occasional from thing in throat Chest pain: no Chest tightness: no Chest congestion: no Nasal congestion: no Runny nose: no Post nasal drip: no Sneezing: no Sore throat:  not sore -- feels like something stuck there when she coughs Swollen glands: no Sinus pressure: no Headache: no Face pain: no Toothache: no Ear pain: none Ear pressure: none Eyes red/itching:no Eye drainage/crusting: no  Vomiting: no Rash:  no Fatigue: no Context: stable Treatments attempted: none    Relevant past medical, surgical, family and social history reviewed and updated as indicated. Interim medical history since our last visit reviewed. Allergies and medications reviewed and updated.  Review of Systems  Constitutional:  Negative for activity change, appetite change, diaphoresis, fatigue and fever.  Respiratory:  Negative for cough, chest tightness and shortness of breath.   Cardiovascular:  Negative for chest pain, palpitations and leg swelling.  Gastrointestinal: Negative.    Endocrine: Negative for polydipsia, polyphagia and polyuria.  Neurological: Negative.   Psychiatric/Behavioral: Negative.      Per HPI unless specifically indicated above     Objective:    BP (!) 146/84 (BP Location: Left Arm)   Pulse 70   Temp 98 F (36.7 C) (Oral)   Wt 237 lb 14.4 oz (107.9 kg)   LMP  (LMP Unknown)   SpO2 99%   BMI 43.51 kg/m   Wt Readings from Last 3 Encounters:  03/25/22 237 lb 14.4 oz (107.9 kg)  02/17/22 237 lb (107.5 kg)  01/27/22 237 lb 3.2 oz (107.6 kg)    Physical Exam Vitals and nursing note reviewed.  Constitutional:      General: She is awake. She is not in acute distress.    Appearance: She is well-developed and well-groomed. She is morbidly obese. She is not ill-appearing.  HENT:     Head: Normocephalic.     Right Ear: Hearing normal.     Left Ear: Hearing normal.  Eyes:     General: Lids are normal.        Right eye: No discharge.        Left eye: No discharge.     Conjunctiva/sclera: Conjunctivae normal.     Pupils: Pupils are equal, round, and reactive to light.  Neck:     Thyroid: No thyromegaly.     Vascular: No carotid bruit.  Cardiovascular:     Rate and Rhythm: Normal rate and regular rhythm.     Heart sounds: Normal heart sounds. No murmur heard.    No gallop.  Pulmonary:     Effort: Pulmonary effort is normal. No accessory muscle usage or respiratory distress.     Breath sounds: Normal breath sounds.  Abdominal:     General: Bowel sounds are normal.     Palpations: Abdomen is soft.     Tenderness: There is no abdominal tenderness.  Musculoskeletal:     Cervical back: Normal range of motion and neck supple.     Right knee:     Instability Tests: Posterior drawer test negative. Medial McMurray test negative and lateral McMurray test negative.     Left knee: Normal.     Right lower leg: Edema (trace) present.     Left lower leg: Edema (trace) present.  Skin:    General: Skin is warm and dry.  Neurological:      Mental Status: She is alert and oriented to person, place, and time.  Psychiatric:        Attention and Perception: Attention normal.        Mood and Affect: Mood normal.        Speech: Speech normal.        Behavior: Behavior normal. Behavior is cooperative.        Thought Content: Thought content normal.    Results for orders placed or performed in visit on 02/17/22  ECHOCARDIOGRAM COMPLETE  Result Value Ref Range   Weight 3,792 oz  Height 62 in   Area-P 1/2 2.74 cm2   S' Lateral 3.20 cm   AV Area mean vel 2.07 cm2   AR max vel 2.19 cm2   AV Area VTI 2.25 cm2   Ao pk vel 1.91 m/s   AV Mean grad 8.4 mmHg   AV Peak grad 14.7 mmHg      Assessment & Plan:   Problem List Items Addressed This Visit       Cardiovascular and Mediastinum   Chronic diastolic heart failure (HCC)    Chronic, ongoing.  Euvolemic on exam today.  Continue current medication regimen and collaboration with cardiology.  Discussed diet choices and to cut back on sodium intake, she eats a higher sodium diet with lots of spicier foods.   - Reminded to call for an overnight weight gain of >2 pounds or a weekly weight weight of >5 pounds - not adding salt to his food and has been reading food labels. Reviewed the importance of keeping daily sodium intake to 2000mg  daily  - No Ibuprofen products      Relevant Medications   isosorbide mononitrate (IMDUR) 60 MG 24 hr tablet   Essential hypertension    Chronic, ongoing.  BP above goal on check today, but has not taken medications yet this morning.  Does eat a lot of sodium and does not consistently take medication.  Recommend ensuring medication taken daily + checking BP at home at least a few mornings a week.  Continue collaboration with cardiology and review notes.  Continue current medication regimen at this time, suspect adjustments will need to be made in future.  CCM referral is in place.  Labs up to date.  Continue to collaborate with cardiology.         Relevant Medications   isosorbide mononitrate (IMDUR) 60 MG 24 hr tablet     Digestive   GERD without esophagitis    Ongoing issue, diet is mainly [place] dishes with lots of spices.  Will start Pepcid 20 MG BID and recommend focus on slight diet changes when possible.  If ongoing issues will consider a referral to GI for further assessment.      Relevant Medications   famotidine (PEPCID) 20 MG tablet     Other   Constipation    Chronic issue, recommend starting daily Metamucil and Colace.  May take Mag Citrate today for cleansing then start daily bowel regimen.  Recommend continue good fluid intake + add in lots of green leafy vegetables and prunes daily.  If ongoing issues consider referral to GI.      Hyperlipidemia    Chronic, ongoing.  Continue current medication regimen and adjust as needed.  Lipid panel up to date.  Have recommended she take this daily to help with prevention.  Discussed this at length with patient.      Relevant Medications   isosorbide mononitrate (IMDUR) 60 MG 24 hr tablet     Follow up plan: Return for as scheduled in October.

## 2022-03-25 NOTE — Assessment & Plan Note (Signed)
Chronic, ongoing.  BP above goal on check today, but has not taken medications yet this morning.  Does eat a lot of sodium and does not consistently take medication.  Recommend ensuring medication taken daily + checking BP at home at least a few mornings a week.  Continue collaboration with cardiology and review notes.  Continue current medication regimen at this time, suspect adjustments will need to be made in future.  CCM referral is in place.  Labs up to date.  Continue to collaborate with cardiology.

## 2022-03-25 NOTE — Assessment & Plan Note (Signed)
Chronic issue, recommend starting daily Metamucil and Colace.  May take Mag Citrate today for cleansing then start daily bowel regimen.  Recommend continue good fluid intake + add in lots of green leafy vegetables and prunes daily.  If ongoing issues consider referral to GI.

## 2022-03-25 NOTE — Assessment & Plan Note (Signed)
Ongoing issue, diet is mainly [place] dishes with lots of spices.  Will start Pepcid 20 MG BID and recommend focus on slight diet changes when possible.  If ongoing issues will consider a referral to GI for further assessment.

## 2022-03-25 NOTE — Telephone Encounter (Signed)
Spoke to patient in office.

## 2022-03-31 ENCOUNTER — Encounter: Payer: Self-pay | Admitting: Interventional Cardiology

## 2022-04-09 ENCOUNTER — Telehealth: Payer: Self-pay

## 2022-04-09 MED ORDER — METOPROLOL SUCCINATE ER 25 MG PO TB24: 25.0000 mg | ORAL_TABLET | Freq: Every day | ORAL | 3 refills | Status: DC

## 2022-04-09 NOTE — Telephone Encounter (Signed)
-----   Message from Lyn Records, MD sent at 02/26/2022  8:01 AM EDT ----- Let the patient know the study shows episodes of supraventricular tachycardia and account for the fluttering and rapid heartbeat.  I would recommend low-dose beta-blocker, metoprolol succinate 25 mg/day.  No atrial fibrillation was identified. A copy will be sent to Patrcia Dolly, DO

## 2022-04-09 NOTE — Telephone Encounter (Signed)
Discussed results of heart monitor with patient.  Per Dr. Katrinka Blazing: Let the patient know the study shows episodes of supraventricular tachycardia and account for the fluttering and rapid heartbeat.  I would recommend low-dose beta-blocker, metoprolol succinate 25 mg/day.  No atrial fibrillation was identified.  Patient verbalized understanding. Patient also states she was not able to tolerate Imdur 120mg  QD and has been taking only 60mg  QD with no issues.  Metoprolol succinate 25mg  QD sent to pharmacy of choice.

## 2022-04-28 NOTE — Progress Notes (Signed)
Cardiology Office Note:    Date:  04/29/2022   ID:  Jennifer Grant, DOB 1951-06-25, MRN [id number]  PCP:  Marjie Skiff, NP  Cardiologist:  Lesleigh Noe, MD   Referring MD: Marjie Skiff, NP   Chief Complaint  Patient presents with   Hypertension   Coronary Artery Disease   Congestive Heart Failure   Cardiac Valve Problem    History of Present Illness:    Jennifer Grant is a 71 y.o. female with a hx of coronary artery disease (LCx, RCA, OM1 &2 RPDA 2004) and h/o stent thrombosis(RCA re-stented 07/2015), primary hypertension, hyperlipidemia, mitral valve prolapse, and chronic diastolic HF.   She is doing okay.  She denies chest pain.  She has not had blood pressure medicine yet today.  She does have some peripheral edema.  She denies orthopnea and PND.  No palpitations.  No need for sublingual nitroglycerin.  Has been off amlodipine now for several days.  She feels it causes phlegm and also feels that it is doing her no good anymore because she has been on it for years.  We had discussion about this and the importance of staying on the medication.  Past Medical History:  Diagnosis Date   CAD (coronary artery disease)    a. 2004 s/p MI w/ PCI/stenting to the LCX, OM1, OM2, RCA, RPDA; b. 05/2015 MI/PCI: 100 OM1 (2.75x20 & 3.0x8 Promus Premier DES'), PTCA OM2; c. 07/2015 Cath/Staged PCI: LM 60, LAD 75d, LCX 64m ISR, OM1 ok, OM2 25 ISR, RCA 50p, 54m/d, 85d (3.0x28 Promus Premier DES).   COVID 2 months ago   Diastolic dysfunction    a. 05/2015 Echo: EF 50-55%, severe lateral HK. Gr1 DD, mildly dil LA. Mildly reduced RV fxn.   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    "got booster shot a few times"   GERD (gastroesophageal reflux disease)    Hyperlipidemia    Hypertensive heart disease    Menopausal state    Morbid obesity (HCC)    Noncompliance    Sleep apnea     Past Surgical History:  Procedure Laterality Date   CARDIAC CATHETERIZATION N/A 06/08/2015   Procedure: Left Heart Cath  and Coronary Angiography;  Surgeon: Lyn Records, MD;  Location: [facility] INVASIVE CV LAB;  Service: Cardiovascular;  Laterality: N/A;   CARDIAC CATHETERIZATION N/A 06/08/2015   Procedure: Coronary Stent Intervention;  Surgeon: Lyn Records, MD;  Location: [facility] INVASIVE CV LAB;  Service: Cardiovascular;  Laterality: N/A;   CARDIAC CATHETERIZATION N/A 07/16/2015   Procedure: Coronary Stent Intervention;  Surgeon: Lyn Records, MD;  Location: [facility] INVASIVE CV LAB;  Service: Cardiovascular;  Laterality: N/A;   COLONOSCOPY N/A 02/17/2021   Procedure: COLONOSCOPY;  Surgeon: Midge Minium, MD;  Location: [facility] ENDOSCOPY;  Service: Endoscopy;  Laterality: N/A;   CORONARY ANGIOPLASTY WITH STENT PLACEMENT     "6 stents before 05/2015"   KNEE ARTHROSCOPY Left 1990's   TUBAL LIGATION  ~ 1978    Current Medications: Current Meds  Medication Sig   amLODipine (NORVASC) 10 MG tablet Take 1 tablet (10 mg total) by mouth daily.   aspirin EC 81 MG tablet Take 81 mg by mouth daily.   cholecalciferol (VITAMIN D3) 25 MCG (1000 UNIT) tablet Take 1,000 Units by mouth daily.   clopidogrel (PLAVIX) 75 MG tablet Take 1 tablet by mouth once daily   diclofenac Sodium (VOLTAREN) 1 % GEL Apply 2 g topically 4 (four) times daily. Apply to right knee  famotidine (PEPCID) 20 MG tablet Take 1 tablet (20 mg total) by mouth 2 (two) times daily.   hydrochlorothiazide (HYDRODIURIL) 25 MG tablet Take 1 tablet by mouth once daily   isosorbide mononitrate (IMDUR) 60 MG 24 hr tablet Take 1 tablet (60 mg total) by mouth daily.   lidocaine (LIDODERM) 5 % Place 1 patch onto the skin daily. Remove & Discard patch within 12 hours or as directed by MD   losartan (COZAAR) 100 MG tablet Take 1 tablet by mouth once daily   metoprolol succinate (TOPROL XL) 25 MG 24 hr tablet Take 1 tablet (25 mg total) by mouth daily.   nitroGLYCERIN (NITROSTAT) 0.4 MG SL tablet DISSOLVE ONE TABLET UNDER THE TONGUE EVERY 5 MINUTES AS NEEDED FOR CHEST PAIN.  DO NOT  EXCEED A TOTAL OF 3 DOSES IN 15 MINUTES   potassium chloride SA (KLOR-CON) 20 MEQ tablet Take 1 tablet by mouth once daily   rosuvastatin (CRESTOR) 40 MG tablet Take 1 tablet by mouth once daily   spironolactone (ALDACTONE) 25 MG tablet Take 0.5 tablets (12.5 mg total) by mouth daily. (Patient taking differently: Take 25 mg by mouth every other day.)   UNABLE TO FIND Neo-Cell     Allergies:   Brilinta [ticagrelor], Lipitor [atorvastatin], Latex, Penicillins, and Tekturna [aliskiren]   Social History   Socioeconomic History   Marital status: Widowed    Spouse name: Not on file   Number of children: Not on file   Years of education: Not on file   Highest education level: 11th grade  Occupational History   Occupation: retired  Tobacco Use   Smoking status: Never   Smokeless tobacco: Never  Vaping Use   Vaping Use: Never used  Substance and Sexual Activity   Alcohol use: No   Drug use: No   Sexual activity: Not Currently    Birth control/protection: Surgical  Other Topics Concern   Not on file  Social History Narrative   Not on file   Social Determinants of Health   Financial Resource Strain: Medium Risk (05/01/2021)   Overall Financial Resource Strain (CARDIA)    Difficulty of Paying Living Expenses: Somewhat hard  Food Insecurity: Food Insecurity Present (05/01/2021)   Hunger Vital Sign    Worried About [place] in the Last Year: Sometimes true    [place] in the Last Year: Sometimes true  Transportation Needs: No Transportation Needs (05/01/2021)   PRAPARE - Hydrologist (Medical): No    Lack of Transportation (Non-Medical): No  Physical Activity: Inactive (05/01/2021)   Exercise Vital Sign    Days of Exercise per Week: 0 days    Minutes of Exercise per Session: 0 min  Stress: No Stress Concern Present (05/01/2021)   [place]    Feeling of Stress : Not at all   Social Connections: Somewhat Isolated (04/14/2018)   Social Connection and Isolation Panel [NHANES]    Frequency of Communication with Friends and Family: More than three times a week    Frequency of Social Gatherings with Friends and Family: More than three times a week    Attends Religious Services: More than 4 times per year    Active Member of [employer] or Organizations: No    Attends Archivist Meetings: Never    Marital Status: Widowed     Family History: The patient's family history includes Healthy in her brother and mother;  Kidney disease in her father; Prostate cancer in her father. There is no history of Cancer, Diabetes, or Heart disease.  ROS:   Please see the history of present illness.    In good spirits.  Denies nitroglycerin use, palpitations, syncope, has occasional lower extremity swelling.  All other systems reviewed and are negative.  EKGs/Labs/Other Studies Reviewed:    The following studies were reviewed today: No new imaging  EKG:  EKG normal sinus rhythm, normal PR interval, left axis deviation.  No changes noted compared to prior.  Recent Labs: 06/08/2021: Hemoglobin 14.3; Platelets 242 12/04/2021: ALT 17; BUN 10; Creatinine, Ser 0.81; Potassium 3.6; Sodium 143; TSH 2.810  Recent Lipid Panel    Component Value Date/Time   CHOL 174 12/04/2021 1214   CHOL 206 (H) 12/28/2017 1356   TRIG 124 12/04/2021 1214   TRIG 136 12/28/2017 1356   HDL 42 12/04/2021 1214   CHOLHDL 4.5 (H) 08/09/2018 1521   CHOLHDL 3.8 07/16/2015 0733   VLDL 27 12/28/2017 1356   LDLCALC 110 (H) 12/04/2021 1214    Physical Exam:    VS:  BP (!) 170/102   Pulse 65   Ht 5\' 2"  (1.575 m)   Wt 235 lb (106.6 kg)   LMP  (LMP Unknown)   SpO2 97%   BMI 42.98 kg/m     Wt Readings from Last 3 Encounters:  04/29/22 235 lb (106.6 kg)  03/25/22 237 lb 14.4 oz (107.9 kg)  02/17/22 237 lb (107.5 kg)    Repeat blood pressure 160/100 mmHg. GEN: Overweight. No acute distress HEENT:  Normal NECK: No JVD. LYMPHATICS: No lymphadenopathy CARDIAC: No murmur. RRR no gallop, or edema. VASCULAR:  Normal Pulses. No bruits. RESPIRATORY:  Clear to auscultation without rales, wheezing or rhonchi  ABDOMEN: Soft, non-tender, non-distended, No pulsatile mass, MUSCULOSKELETAL: No deformity  SKIN: Warm and dry NEUROLOGIC:  Alert and oriented x 3 PSYCHIATRIC:  Normal affect   ASSESSMENT:    1. 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 Paroxysmal atrial fibrillation (HCC)   3. Essential hypertension   4. Chronic diastolic heart failure (HCC)   5. Hyperlipidemia, unspecified hyperlipidemia type    PLAN:    In order of problems listed above:  Secondary prevention reviewed.  Weight loss, exercise, lipid-lowering, were all discussed.  Last LDL was 110.  She does need better LDL control.  She had probably had Zetia but she is resistant to the idea of taking additional medication. Continue beta-blocker therapy.  No documented atrial fibrillation.  She had PAF while hospitalized for her MI.  Continue aspirin therapy until or unless she has another episode of A-fib that occurs spontaneously. Very poor control.  Some issues with medication compliance and sodium intake.  Continue current therapy.  Blood pressure was extremely elevated on admission this morning.  She had not yet taken any of her medication and in addition had not taken amlodipine for more than a week.  She took her medication while in the room.  She will go to the drugstore and get amlodipine and resume it. No evidence of volume overload.  Consider SGLT2.  Also have room to increase spironolactone to 25 mg/day. Consider adding Zetia.  We will have to encourage the patient to take an additional medication.  Overall education and awareness concerning primary/secondary risk prevention was discussed in detail: LDL less than 70, hemoglobin A1c less than 7, blood pressure target  less than 130/80 mmHg, >150  minutes of moderate aerobic activity per week, avoidance of smoking, weight control (via diet and exercise), and continued surveillance/management of/for obstructive sleep apnea.    Medication Adjustments/Labs and Tests Ordered: Current medicines are reviewed at length with the patient today.  Concerns regarding medicines are outlined above.  Orders Placed This Encounter  Procedures   EKG 12-Lead   No orders of the defined types were placed in this encounter.   Patient Instructions  Medication Instructions:  Your physician recommends that you continue on your current medications as directed. Please refer to the Current Medication list given to you today.  *If you need a refill on your cardiac medications before your next appointment, please call your pharmacy*   Lab Work: None ordered  If you have labs (blood work) drawn today and your tests are completely normal, you will receive your results only by: [place] (if you have MyChart) OR A paper copy in the mail If you have any lab test that is abnormal or we need to change your treatment, we will call you to review the results.   Testing/Procedures: None ordered   Follow-Up: At [facility], you and your health needs are our priority.  As part of our continuing mission to provide you with exceptional heart care, we have created designated Provider Care Teams.  These Care Teams include your primary Cardiologist (physician) and Advanced Practice Providers (APPs -  Physician Assistants and Nurse Practitioners) who all work together to provide you with the care you need, when you need it.  We recommend signing up for the patient portal called "MyChart".  Sign up information is provided on this After Visit Summary.  MyChart is used to connect with patients for Virtual Visits (Telemedicine).  Patients are able to view lab/test results, encounter notes, upcoming appointments, etc.  Non-urgent  messages can be sent to your provider as well.   To learn more about what you can do with MyChart, go to [email or web address].    Your next appointment:   6 month(s)  The format for your next appointment:   In Person  Provider:   Dr. Johney Frame (transitioning from Dr. Tamala Julian)     Other Instructions   Important Information About Sugar         Signed, Sinclair Grooms, MD  04/29/2022 9:26 AM    [place]

## 2022-04-29 ENCOUNTER — Ambulatory Visit: Payer: Medicare Other | Attending: Interventional Cardiology | Admitting: Interventional Cardiology

## 2022-04-29 ENCOUNTER — Encounter: Payer: Self-pay | Admitting: Interventional Cardiology

## 2022-04-29 VITALS — BP 160/100 | HR 65 | Ht 62.0 in | Wt 235.0 lb

## 2022-04-29 DIAGNOSIS — I25118 Atherosclerotic heart disease of native coronary artery with other forms of angina pectoris: Secondary | ICD-10-CM | POA: Diagnosis not present

## 2022-04-29 DIAGNOSIS — I5032 Chronic diastolic (congestive) heart failure: Secondary | ICD-10-CM

## 2022-04-29 DIAGNOSIS — E785 Hyperlipidemia, unspecified: Secondary | ICD-10-CM

## 2022-04-29 DIAGNOSIS — I10 Essential (primary) hypertension: Secondary | ICD-10-CM

## 2022-04-29 DIAGNOSIS — I480 Paroxysmal atrial fibrillation: Secondary | ICD-10-CM | POA: Diagnosis not present

## 2022-04-29 NOTE — Patient Instructions (Signed)
Medication Instructions:  Your physician recommends that you continue on your current medications as directed. Please refer to the Current Medication list given to you today.  *If you need a refill on your cardiac medications before your next appointment, please call your pharmacy*   Lab Work: None ordered  If you have labs (blood work) drawn today and your tests are completely normal, you will receive your results only by: MyChart Message (if you have MyChart) OR A paper copy in the mail If you have any lab test that is abnormal or we need to change your treatment, we will call you to review the results.   Testing/Procedures: None ordered   Follow-Up: At [facility], you and your health needs are our priority.  As part of our continuing mission to provide you with exceptional heart care, we have created designated Provider Care Teams.  These Care Teams include your primary Cardiologist (physician) and Advanced Practice Providers (APPs -  Physician Assistants and Nurse Practitioners) who all work together to provide you with the care you need, when you need it.  We recommend signing up for the patient portal called "MyChart".  Sign up information is provided on this After Visit Summary.  MyChart is used to connect with patients for Virtual Visits (Telemedicine).  Patients are able to view lab/test results, encounter notes, upcoming appointments, etc.  Non-urgent messages can be sent to your provider as well.   To learn more about what you can do with MyChart, go to [email or web address].    Your next appointment:   6 month(s)  The format for your next appointment:   In Person  Provider:   Dr. Shari Prows (transitioning from Dr. Katrinka Blazing)     Other Instructions   Important Information About Sugar

## 2022-05-04 ENCOUNTER — Ambulatory Visit (INDEPENDENT_AMBULATORY_CARE_PROVIDER_SITE_OTHER): Payer: Medicare Other | Admitting: *Deleted

## 2022-05-04 DIAGNOSIS — Z0000 Encounter for general adult medical examination without abnormal findings: Secondary | ICD-10-CM | POA: Diagnosis not present

## 2022-05-04 NOTE — Patient Instructions (Signed)
Jennifer Grant , Thank you for taking time to come for your Medicare Wellness Visit. I appreciate your ongoing commitment to your health goals. Please review the following plan we discussed and let me know if I can assist you in the future.   Screening recommendations/referrals: Colonoscopy: no longer required Mammogram: Education provided Bone Density: Education provided Recommended yearly ophthalmology/optometry visit for glaucoma screening and checkup Recommended yearly dental visit for hygiene and checkup  Vaccinations: Influenza vaccine: Education provided Pneumococcal vaccine: up to date Tdap vaccine: up to date Shingles vaccine: Education provided    Advanced directives: Education provided  Conditions/risks identified:     Preventive Care 65 Years and Older, Female Preventive care refers to lifestyle choices and visits with your health care provider that can promote health and wellness. What does preventive care include? A yearly physical exam. This is also called an annual well check. Dental exams once or twice a year. Routine eye exams. Ask your health care provider how often you should have your eyes checked. Personal lifestyle choices, including: Daily care of your teeth and gums. Regular physical activity. Eating a healthy diet. Avoiding tobacco and drug use. Limiting alcohol use. Practicing safe sex. Taking low-dose aspirin every day. Taking vitamin and mineral supplements as recommended by your health care provider. What happens during an annual well check? The services and screenings done by your health care provider during your annual well check will depend on your age, overall health, lifestyle risk factors, and family history of disease. Counseling  Your health care provider may ask you questions about your: Alcohol use. Tobacco use. Drug use. Emotional well-being. Home and relationship well-being. Sexual activity. Eating habits. History of falls. Memory  and ability to understand (cognition). Work and work Astronomer. Reproductive health. Screening  You may have the following tests or measurements: Height, weight, and BMI. Blood pressure. Lipid and cholesterol levels. These may be checked every 5 years, or more frequently if you are over 33 years old. Skin check. Lung cancer screening. You may have this screening every year starting at age 88 if you have a 30-pack-year history of smoking and currently smoke or have quit within the past 15 years. Fecal occult blood test (FOBT) of the stool. You may have this test every year starting at age 30. Flexible sigmoidoscopy or colonoscopy. You may have a sigmoidoscopy every 5 years or a colonoscopy every 10 years starting at age 47. Hepatitis C blood test. Hepatitis B blood test. Sexually transmitted disease (STD) testing. Diabetes screening. This is done by checking your blood sugar (glucose) after you have not eaten for a while (fasting). You may have this done every 1-3 years. Bone density scan. This is done to screen for osteoporosis. You may have this done starting at age 48. Mammogram. This may be done every 1-2 years. Talk to your health care provider about how often you should have regular mammograms. Talk with your health care provider about your test results, treatment options, and if necessary, the need for more tests. Vaccines  Your health care provider may recommend certain vaccines, such as: Influenza vaccine. This is recommended every year. Tetanus, diphtheria, and acellular pertussis (Tdap, Td) vaccine. You may need a Td booster every 10 years. Zoster vaccine. You may need this after age 51. Pneumococcal 13-valent conjugate (PCV13) vaccine. One dose is recommended after age 49. Pneumococcal polysaccharide (PPSV23) vaccine. One dose is recommended after age 30. Talk to your health care provider about which screenings and vaccines you need and how  often you need them. This  information is not intended to replace advice given to you by your health care provider. Make sure you discuss any questions you have with your health care provider. Document Released: 09/12/2015 Document Revised: 05/05/2016 Document Reviewed: 06/17/2015 Elsevier Interactive Patient Education  2017 [place] Prevention in the Home Falls can cause injuries. They can happen to people of all ages. There are many things you can do to make your home safe and to help prevent falls. What can I do on the outside of my home? Regularly fix the edges of walkways and driveways and fix any cracks. Remove anything that might make you trip as you walk through a door, such as a raised step or threshold. Trim any bushes or trees on the path to your home. Use bright outdoor lighting. Clear any walking paths of anything that might make someone trip, such as rocks or tools. Regularly check to see if handrails are loose or broken. Make sure that both sides of any steps have handrails. Any raised decks and porches should have guardrails on the edges. Have any leaves, snow, or ice cleared regularly. Use sand or salt on walking paths during winter. Clean up any spills in your garage right away. This includes oil or grease spills. What can I do in the bathroom? Use night lights. Install grab bars by the toilet and in the tub and shower. Do not use towel bars as grab bars. Use non-skid mats or decals in the tub or shower. If you need to sit down in the shower, use a plastic, non-slip stool. Keep the floor dry. Clean up any water that spills on the floor as soon as it happens. Remove soap buildup in the tub or shower regularly. Attach bath mats securely with double-sided non-slip rug tape. Do not have throw rugs and other things on the floor that can make you trip. What can I do in the bedroom? Use night lights. Make sure that you have a light by your bed that is easy to reach. Do not use any sheets or  blankets that are too big for your bed. They should not hang down onto the floor. Have a firm chair that has side arms. You can use this for support while you get dressed. Do not have throw rugs and other things on the floor that can make you trip. What can I do in the kitchen? Clean up any spills right away. Avoid walking on wet floors. Keep items that you use a lot in easy-to-reach places. If you need to reach something above you, use a strong step stool that has a grab bar. Keep electrical cords out of the way. Do not use floor polish or wax that makes floors slippery. If you must use wax, use non-skid floor wax. Do not have throw rugs and other things on the floor that can make you trip. What can I do with my stairs? Do not leave any items on the stairs. Make sure that there are handrails on both sides of the stairs and use them. Fix handrails that are broken or loose. Make sure that handrails are as long as the stairways. Check any carpeting to make sure that it is firmly attached to the stairs. Fix any carpet that is loose or worn. Avoid having throw rugs at the top or bottom of the stairs. If you do have throw rugs, attach them to the floor with carpet tape. Make sure that you have a  light switch at the top of the stairs and the bottom of the stairs. If you do not have them, ask someone to add them for you. What else can I do to help prevent falls? Wear shoes that: Do not have high heels. Have rubber bottoms. Are comfortable and fit you well. Are closed at the toe. Do not wear sandals. If you use a stepladder: Make sure that it is fully opened. Do not climb a closed stepladder. Make sure that both sides of the stepladder are locked into place. Ask someone to hold it for you, if possible. Clearly mark and make sure that you can see: Any grab bars or handrails. First and last steps. Where the edge of each step is. Use tools that help you move around (mobility aids) if they are  needed. These include: Canes. Walkers. Scooters. Crutches. Turn on the lights when you go into a dark area. Replace any light bulbs as soon as they burn out. Set up your furniture so you have a clear path. Avoid moving your furniture around. If any of your floors are uneven, fix them. If there are any pets around you, be aware of where they are. Review your medicines with your doctor. Some medicines can make you feel dizzy. This can increase your chance of falling. Ask your doctor what other things that you can do to help prevent falls. This information is not intended to replace advice given to you by your health care provider. Make sure you discuss any questions you have with your health care provider. Document Released: 06/12/2009 Document Revised: 01/22/2016 Document Reviewed: 09/20/2014 Elsevier Interactive Patient Education  2017 [employer].

## 2022-05-04 NOTE — Progress Notes (Signed)
Subjective:   Jennifer Grant is a 71 y.o. female who presents for Medicare Annual (Subsequent) preventive examination.  I connected with  [name] on 05/04/22 by a telephone enabled telemedicine application and verified that I am speaking with the correct person using two identifiers.   I discussed the limitations of evaluation and management by telemedicine. The patient expressed understanding and agreed to proceed.  Patient location: home  Provider location: Telephone-health-home    Review of Systems     Cardiac Risk Factors include: advanced age (>39men, >43 women);obesity (BMI >30kg/m2);hypertension     Objective:    Today's Vitals   There is no height or weight on file to calculate BMI.     05/04/2022    8:36 AM 05/01/2021    8:44 AM 04/16/2021    9:07 AM 02/17/2021    8:25 AM 06/22/2020   10:33 PM 04/28/2020    3:24 PM 04/19/2019    1:21 PM  Advanced Directives  Does Patient Have a Medical Advance Directive? No No No No No No No  Would patient like information on creating a medical advance directive? No - Patient declined  No - Patient declined No - Patient declined No - Patient declined      Current Medications (verified) Outpatient Encounter Medications as of 05/04/2022  Medication Sig   amLODipine (NORVASC) 10 MG tablet Take 1 tablet (10 mg total) by mouth daily.   aspirin EC 81 MG tablet Take 81 mg by mouth daily.   cholecalciferol (VITAMIN D3) 25 MCG (1000 UNIT) tablet Take 1,000 Units by mouth daily.   clopidogrel (PLAVIX) 75 MG tablet Take 1 tablet by mouth once daily   diclofenac Sodium (VOLTAREN) 1 % GEL Apply 2 g topically 4 (four) times daily. Apply to right knee   famotidine (PEPCID) 20 MG tablet Take 1 tablet (20 mg total) by mouth 2 (two) times daily.   hydrochlorothiazide (HYDRODIURIL) 25 MG tablet Take 1 tablet by mouth once daily   isosorbide mononitrate (IMDUR) 60 MG 24 hr tablet Take 1 tablet (60 mg total) by mouth daily.   lidocaine  (LIDODERM) 5 % Place 1 patch onto the skin daily. Remove & Discard patch within 12 hours or as directed by MD   losartan (COZAAR) 100 MG tablet Take 1 tablet by mouth once daily   nitroGLYCERIN (NITROSTAT) 0.4 MG SL tablet DISSOLVE ONE TABLET UNDER THE TONGUE EVERY 5 MINUTES AS NEEDED FOR CHEST PAIN.  DO NOT EXCEED A TOTAL OF 3 DOSES IN 15 MINUTES   potassium chloride SA (KLOR-CON) 20 MEQ tablet Take 1 tablet by mouth once daily   rosuvastatin (CRESTOR) 40 MG tablet Take 1 tablet by mouth once daily   spironolactone (ALDACTONE) 25 MG tablet Take 0.5 tablets (12.5 mg total) by mouth daily. (Patient taking differently: Take 25 mg by mouth every other day.)   UNABLE TO FIND Neo-Cell   metoprolol succinate (TOPROL XL) 25 MG 24 hr tablet Take 1 tablet (25 mg total) by mouth daily. (Patient not taking: Reported on 05/04/2022)   No facility-administered encounter medications on file as of 05/04/2022.    Allergies (verified) Brilinta [ticagrelor], Lipitor [atorvastatin], Latex, Penicillins, and Tekturna [aliskiren]   History: Past Medical History:  Diagnosis Date   CAD (coronary artery disease)    a. 2004 s/p MI w/ PCI/stenting to the LCX, OM1, OM2, RCA, RPDA; b. 05/2015 MI/PCI: 100 OM1 (2.75x20 & 3.0x8 Promus Premier DES'), PTCA OM2; c. 07/2015 Cath/Staged PCI: LM 60, LAD 75d,  LCX 76m ISR, OM1 ok, OM2 25 ISR, RCA 50p, 37m/d, 85d (3.0x28 Promus Premier DES).   COVID 2 months ago   Diastolic dysfunction    a. 05/2015 Echo: EF 50-55%, severe lateral HK. Gr1 DD, mildly dil LA. Mildly reduced RV fxn.   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    "got booster shot a few times"   GERD (gastroesophageal reflux disease)    Hyperlipidemia    Hypertensive heart disease    Menopausal state    Morbid obesity (HCC)    Noncompliance    Sleep apnea    Past Surgical History:  Procedure Laterality Date   CARDIAC CATHETERIZATION N/A 06/08/2015   Procedure: Left Heart Cath and Coronary Angiography;  Surgeon: Lyn Records, MD;  Location:  [facility] INVASIVE CV LAB;  Service: Cardiovascular;  Laterality: N/A;   CARDIAC CATHETERIZATION N/A 06/08/2015   Procedure: Coronary Stent Intervention;  Surgeon: Lyn Records, MD;  Location: [facility] INVASIVE CV LAB;  Service: Cardiovascular;  Laterality: N/A;   CARDIAC CATHETERIZATION N/A 07/16/2015   Procedure: Coronary Stent Intervention;  Surgeon: Lyn Records, MD;  Location: [facility] INVASIVE CV LAB;  Service: Cardiovascular;  Laterality: N/A;   COLONOSCOPY N/A 02/17/2021   Procedure: COLONOSCOPY;  Surgeon: Midge Minium, MD;  Location: [facility] ENDOSCOPY;  Service: Endoscopy;  Laterality: N/A;   CORONARY ANGIOPLASTY WITH STENT PLACEMENT     "6 stents before 05/2015"   KNEE ARTHROSCOPY Left 1990's   TUBAL LIGATION  ~ 1978   Family History  Problem Relation Age of Onset   Healthy Mother    Prostate cancer Father    Kidney disease Father    Healthy Brother    Cancer Neg Hx    Diabetes Neg Hx    Heart disease Neg Hx    Social History   Socioeconomic History   Marital status: Widowed    Spouse name: Not on file   Number of children: Not on file   Years of education: Not on file   Highest education level: 11th grade  Occupational History   Occupation: retired  Tobacco Use   Smoking status: Never   Smokeless tobacco: Never  Vaping Use   Vaping Use: Never used  Substance and Sexual Activity   Alcohol use: No   Drug use: No   Sexual activity: Not Currently    Birth control/protection: Surgical  Other Topics Concern   Not on file  Social History Narrative   Not on file   Social Determinants of Health   Financial Resource Strain: Medium Risk (05/04/2022)   Overall Financial Resource Strain (CARDIA)    Difficulty of Paying Living Expenses: Somewhat hard  Food Insecurity: Food Insecurity Present (05/01/2021)   Hunger Vital Sign    Worried About Running Out of Food in the Last Year: Sometimes true    Ran Out of Food in the Last Year: Sometimes true  Transportation Needs: No Transportation Needs  (05/04/2022)   PRAPARE - Administrator, Civil Service (Medical): No    Lack of Transportation (Non-Medical): No  Physical Activity: Inactive (05/04/2022)   Exercise Vital Sign    Days of Exercise per Week: 0 days    Minutes of Exercise per Session: 0 min  Stress: No Stress Concern Present (05/04/2022)   [employer] of Occupational Health - Occupational Stress Questionnaire    Feeling of Stress : Not at all  Social Connections: Moderately Isolated (05/04/2022)   Social Connection and Isolation Panel [NHANES]    Frequency of Communication with  Friends and Family: Three times a week    Frequency of Social Gatherings with Friends and Family: Three times a week    Attends Religious Services: More than 4 times per year    Active Member of Clubs or Organizations: No    Attends Banker Meetings: Never    Marital Status: Widowed    Tobacco Counseling Counseling given: Not Answered   Clinical Intake:  Pre-visit preparation completed: Yes  Pain : No/denies pain     Diabetes: No  How often do you need to have someone help you when you read instructions, pamphlets, or other written materials from your doctor or pharmacy?: 1 - Never  Diabetic?  Interpreter Needed?: No  Information entered by :: Remi Haggard LPN   Activities of Daily Living    05/04/2022    8:53 AM  In your present state of health, do you have any difficulty performing the following activities:  Hearing? 0  Vision? 0  Difficulty concentrating or making decisions? 0  Walking or climbing stairs? 0  Dressing or bathing? 0  Doing errands, shopping? 1  Using the Toilet? N  In the past six months, have you accidently leaked urine? N  Do you have problems with loss of bowel control? N  Managing your Medications? N  Housekeeping or managing your Housekeeping? N    Patient Care Team: Marjie Skiff, NP as PCP - General (Nurse Practitioner) Lyn Records, MD as PCP - Cardiology  (Cardiology)  Indicate any recent Medical Services you may have received from other than [facility] providers in the past year (date may be approximate).     Assessment:   This is a routine wellness examination for Jennifer Grant.  Hearing/Vision screen Hearing Screening - Comments:: No trouble hearing Vision Screening - Comments:: Not up to date [facility]  Dietary issues and exercise activities discussed: Current Exercise Habits: The patient does not participate in regular exercise at present   Goals Addressed             This Visit's Progress    Patient Stated       Increase water intake       Depression Screen    05/04/2022    9:01 AM 05/04/2022    8:49 AM 05/01/2021    8:45 AM 10/03/2020    1:21 PM 04/28/2020    3:25 PM 09/04/2019   10:26 AM 04/19/2019    1:21 PM  PHQ 2/9 Scores  PHQ - 2 Score 0 0 0 0 0 0 0  PHQ- 9 Score    0  0     Fall Risk    05/04/2022    8:36 AM 05/01/2021    8:45 AM 04/20/2021    1:04 PM 10/03/2020    1:21 PM 04/28/2020    3:25 PM  Fall Risk   Falls in the past year? 0 0 0 1 0  Number falls in past yr: 0  0    Injury with Fall? 0  0    Risk for fall due to :  Medication side effect No Fall Risks  Medication side effect  Follow up Falls evaluation completed;Education provided;Falls prevention discussed Falls evaluation completed;Education provided;Falls prevention discussed Falls evaluation completed  Falls evaluation completed;Education provided;Falls prevention discussed    FALL RISK PREVENTION PERTAINING TO THE HOME:  Any stairs in or around the home? Yes  If so, are there any without handrails? No  Home free of loose throw rugs in walkways, pet  beds, electrical cords, etc? yes Adequate lighting in your home to reduce risk of falls  yes  ASSISTIVE DEVICES UTILIZED TO PREVENT FALLS:  Life alert? No  Use of a cane, walker or w/c? No  Grab bars in the bathroom? No  Shower chair or bench in shower? No  Elevated toilet seat or a handicapped toilet?  No   TIMED UP AND GO:  Was the test performed? No .      Cognitive Function:        05/04/2022    8:37 AM 05/01/2021    8:48 AM 04/28/2020    3:27 PM 04/14/2018    1:13 PM 03/11/2017    2:52 PM  6CIT Screen  What Year? 0 points 0 points 0 points 0 points 0 points  What month? 0 points 0 points 0 points 0 points 0 points  What time? 0 points 0 points 0 points 0 points 0 points  Count back from 20 0 points 0 points 2 points 0 points 0 points  Months in reverse 2 points 0 points 0 points 0 points 0 points  Repeat phrase 0 points 8 points 8 points 0 points 0 points  Total Score 2 points 8 points 10 points 0 points 0 points    Immunizations Immunization History  Administered Date(s) Administered   Influenza, High Dose Seasonal PF 08/09/2018   Influenza,inj,Quad PF,6+ Mos 06/18/2015   Influenza-Unspecified 08/12/2014   Pneumococcal Conjugate-13 08/12/2014   Pneumococcal Polysaccharide-23 03/04/2010, 03/24/2016   Td 08/30/2004   Tdap 03/24/2016    TDAP status: Up to date  Flu Vaccine status: Declined, Education has been provided regarding the importance of this vaccine but patient still declined. Advised may receive this vaccine at local pharmacy or Health Dept. Aware to provide a copy of the vaccination record if obtained from local pharmacy or Health Dept. Verbalized acceptance and understanding.  Pneumococcal vaccine status: Up to date  Covid-19 vaccine status: Declined, Education has been provided regarding the importance of this vaccine but patient still declined. Advised may receive this vaccine at local pharmacy or Health Dept.or vaccine clinic. Aware to provide a copy of the vaccination record if obtained from local pharmacy or Health Dept. Verbalized acceptance and understanding.  Qualifies for Shingles Vaccine? Yes   Zostavax completed No   Shingrix Completed?: No.    Education has been provided regarding the importance of this vaccine. Patient has been advised to call  insurance company to determine out of pocket expense if they have not yet received this vaccine. Advised may also receive vaccine at local pharmacy or Health Dept. Verbalized acceptance and understanding.  Screening Tests Health Maintenance  Topic Date Due   MAMMOGRAM  01/12/2019   Zoster Vaccines- Shingrix (1 of 2) 08/03/2022 (Originally 10/31/1998)   INFLUENZA VACCINE  11/28/2022 (Originally 03/30/2022)   COLONOSCOPY (Pts [id number] Insurance coverage will need to be confirmed)  02/17/2026   TETANUS/TDAP  03/24/2026   Pneumonia Vaccine 8865+ Years old  Completed   DEXA SCAN  Completed   Hepatitis C Screening  Completed   HPV VACCINES  Aged Out   COVID-19 Vaccine  Discontinued    Health Maintenance  Health Maintenance Due  Topic Date Due   MAMMOGRAM  01/12/2019    Colorectal cancer screening: No longer required.   Mammogram status: Ordered  . Pt provided with contact info and advised to call to schedule appt.  Phone number given to schedule Bone Density status: Ordered  . Pt provided with contact info and  advised to call to schedule appt.  Lung Cancer Screening: (Low Dose CT Chest recommended if Age 67-80 years, 30 pack-year currently smoking OR have quit w/in 15years.) does not qualify.   Lung Cancer Screening Referral:   Additional Screening:  Hepatitis C Screening: does not qualify; Completed 2017  Vision Screening: Recommended annual ophthalmology exams for early detection of glaucoma and other disorders of the eye. Is the patient up to date with their annual eye exam?  No  Who is the provider or what is the name of the office in which the patient attends annual eye exams? [facility]  will call to schedule If pt is not established with a provider, would they like to be referred to a provider to establish care? No .   Dental Screening: Recommended annual dental exams for proper oral hygiene  Community Resource Referral / Chronic Care Management: CRR required this visit?   No   CCM required this visit?  No      Plan:     I have personally reviewed and noted the following in the patient's chart:   Medical and social history Use of alcohol, tobacco or illicit drugs  Current medications and supplements including opioid prescriptions. Patient is not currently taking opioid prescriptions. Functional ability and status Nutritional status Physical activity Advanced directives List of other physicians Hospitalizations, surgeries, and ER visits in previous 12 months Vitals Screenings to include cognitive, depression, and falls Referrals and appointments  In addition, I have reviewed and discussed with patient certain preventive protocols, quality metrics, and best practice recommendations. A written personalized care plan for preventive services as well as general preventive health recommendations were provided to patient.     Remi Haggard, LPN   [id number]   Nurse Notes:

## 2022-05-07 ENCOUNTER — Ambulatory Visit: Payer: Medicare Other

## 2022-06-06 NOTE — Patient Instructions (Signed)

## 2022-06-07 ENCOUNTER — Ambulatory Visit (INDEPENDENT_AMBULATORY_CARE_PROVIDER_SITE_OTHER): Payer: Medicare Other | Admitting: Nurse Practitioner

## 2022-06-07 ENCOUNTER — Encounter: Payer: Self-pay | Admitting: Nurse Practitioner

## 2022-06-07 VITALS — BP 128/81 | HR 84 | Temp 98.0°F | Ht 62.0 in | Wt 233.6 lb

## 2022-06-07 DIAGNOSIS — R7309 Other abnormal glucose: Secondary | ICD-10-CM

## 2022-06-07 DIAGNOSIS — Z6841 Body Mass Index (BMI) 40.0 and over, adult: Secondary | ICD-10-CM

## 2022-06-07 DIAGNOSIS — I25118 Atherosclerotic heart disease of native coronary artery with other forms of angina pectoris: Secondary | ICD-10-CM

## 2022-06-07 DIAGNOSIS — I252 Old myocardial infarction: Secondary | ICD-10-CM

## 2022-06-07 DIAGNOSIS — I700 Atherosclerosis of aorta: Secondary | ICD-10-CM

## 2022-06-07 DIAGNOSIS — Z0000 Encounter for general adult medical examination without abnormal findings: Secondary | ICD-10-CM

## 2022-06-07 DIAGNOSIS — E782 Mixed hyperlipidemia: Secondary | ICD-10-CM | POA: Diagnosis not present

## 2022-06-07 DIAGNOSIS — Z91148 Patient's other noncompliance with medication regimen for other reason: Secondary | ICD-10-CM

## 2022-06-07 DIAGNOSIS — E559 Vitamin D deficiency, unspecified: Secondary | ICD-10-CM

## 2022-06-07 DIAGNOSIS — I10 Essential (primary) hypertension: Secondary | ICD-10-CM

## 2022-06-07 DIAGNOSIS — Z23 Encounter for immunization: Secondary | ICD-10-CM

## 2022-06-07 DIAGNOSIS — I471 Supraventricular tachycardia, unspecified: Secondary | ICD-10-CM | POA: Diagnosis not present

## 2022-06-07 DIAGNOSIS — K219 Gastro-esophageal reflux disease without esophagitis: Secondary | ICD-10-CM | POA: Diagnosis not present

## 2022-06-07 DIAGNOSIS — I5032 Chronic diastolic (congestive) heart failure: Secondary | ICD-10-CM | POA: Diagnosis not present

## 2022-06-07 MED ORDER — LIDOCAINE 5 % EX PTCH: 1.0000 | MEDICATED_PATCH | CUTANEOUS | 5 refills | Status: DC

## 2022-06-07 MED ORDER — AMLODIPINE BESYLATE 10 MG PO TABS: 10.0000 mg | ORAL_TABLET | Freq: Every day | ORAL | 4 refills | Status: DC

## 2022-06-07 MED ORDER — ISOSORBIDE MONONITRATE ER 60 MG PO TB24: 60.0000 mg | ORAL_TABLET | Freq: Every day | ORAL | 4 refills | Status: DC

## 2022-06-07 MED ORDER — HYDROCHLOROTHIAZIDE 25 MG PO TABS: 25.0000 mg | ORAL_TABLET | Freq: Every day | ORAL | 4 refills | Status: DC

## 2022-06-07 MED ORDER — METOPROLOL SUCCINATE ER 25 MG PO TB24: 25.0000 mg | ORAL_TABLET | Freq: Every day | ORAL | 4 refills | Status: DC

## 2022-06-07 MED ORDER — ROSUVASTATIN CALCIUM 40 MG PO TABS: 40.0000 mg | ORAL_TABLET | Freq: Every day | ORAL | 4 refills | Status: DC

## 2022-06-07 MED ORDER — LOSARTAN POTASSIUM 100 MG PO TABS: 100.0000 mg | ORAL_TABLET | Freq: Every day | ORAL | 4 refills | Status: DC

## 2022-06-07 MED ORDER — SPIRONOLACTONE 25 MG PO TABS: 25.0000 mg | ORAL_TABLET | ORAL | 4 refills | Status: DC

## 2022-06-07 NOTE — Assessment & Plan Note (Signed)
A1c check today and urine ALB is 27 December 2021, continue Losartan for kidney protection and recommend patient focus heavily on diet changes and regular activity.

## 2022-06-07 NOTE — Progress Notes (Signed)
BP 128/81   Pulse 84   Temp 98 F (36.7 C) (Oral)   Ht 5\' 2"  (1.575 m)   Wt 233 lb 9.6 oz (106 kg)   LMP  (LMP Unknown)   SpO2 98%   BMI 42.73 kg/m    Subjective:    Patient ID: , female    DOB: 19-Feb-1951, 71 y.o.   MRN: [id number]  HPI: Jennifer Grant is a 71 y.o. female presenting on 06/07/2022 for comprehensive medical examination. Current medical complaints include:none  She currently lives with: daughter Menopausal Symptoms: no  HYPERTENSION / HYPERLIPIDEMIA/HF Currently taking Losartan, Amlodipine, HCTZ, Isosorbide Mono, Plavix, and ASA + Rosuvastatin.  Is to be taking Metoprolol XL 25 MG daily due to PAF while in hospital for MI = makes her feel sick and is not taking - tried taking for a long time.  Last saw cardiology on 04/29/22 = they discussed with her adding on Zetia and SGLT2, both of which she refuses as does not want more medications.    Has history of sleep study noting OSA, but she does not wear CPAP.  No recent NTG use.  Continues to endorse poor diet choices -- eats lots of spicy food and high sodium.     June 2023 echo EF 50-55%, chronic diastolic HF diagnosis on chart. History of MI in 2004.   Satisfied with current treatment? yes Duration of hypertension: chronic BP monitoring frequency: rarely BP range: <130/80 on average she reports BP medication side effects: no Duration of hyperlipidemia: chronic Cholesterol medication side effects: no Cholesterol supplements: none Medication compliance: good compliance Aspirin: yes Recent stressors: no Recurrent headaches: no Visual changes: no Palpitations: no Dyspnea: none Chest pain: no Lower extremity edema: no Dizzy/lightheaded: no  The ASCVD Risk score (Arnett DK, et al., 2019) failed to calculate for the following reasons:   The patient has a prior MI or stroke diagnosis  GERD Takes Pepcid only as needed, not taking often.  Recent A1c 5.4%, history of elevations in past. GERD  control status: stable Satisfied with current treatment? yes Heartburn frequency:  Medication side effects: no  Medication compliance: stable Dysphagia: no Odynophagia:  no Hematemesis: no Blood in stool: no EGD: no      05/04/2022    9:01 AM 05/04/2022    8:49 AM 05/01/2021    8:45 AM 10/03/2020    1:21 PM 04/28/2020    3:25 PM  Depression screen PHQ 2/9  Decreased Interest 0 0 0 0 0  Down, Depressed, Hopeless 0 0 0 0 0  PHQ - 2 Score 0 0 0 0 0  Altered sleeping    0   Tired, decreased energy    0   Change in appetite    0   Feeling bad or failure about yourself     0   Trouble concentrating    0   Moving slowly or fidgety/restless    0   Suicidal thoughts    0   PHQ-9 Score    0     Functional Status Survey: Is the patient deaf or have difficulty hearing?: No Does the patient have difficulty seeing, even when wearing glasses/contacts?: No Does the patient have difficulty concentrating, remembering, or making decisions?: No Does the patient have difficulty walking or climbing stairs?: No Does the patient have difficulty dressing or bathing?: No Does the patient have difficulty doing errands alone such as visiting a doctor's office or shopping?: No  04/16/2021    8:53 AM 04/20/2021    1:04 PM 05/01/2021    8:45 AM 05/04/2022    8:36 AM 06/07/2022    1:24 PM  Fall Risk  Falls in the past year?  0 0 0 0  Was there an injury with Fall?  0  0 0  Fall Risk Category Calculator  0  0 0  Fall Risk Category  Low  Low Low  Patient Fall Risk Level Low fall risk  Low fall risk Low fall risk Low fall risk  Patient at Risk for Falls Due to  No Fall Risks Medication side effect  No Fall Risks  Fall risk Follow up  Falls evaluation completed Falls evaluation completed;Education provided;Falls prevention discussed Falls evaluation completed;Education provided;Falls prevention discussed Falls prevention discussed    Past Medical History:  Past Medical History:  Diagnosis Date   CAD  (coronary artery disease)    a. 2004 s/p MI w/ PCI/stenting to the LCX, OM1, OM2, RCA, RPDA; b. 05/2015 MI/PCI: 100 OM1 (2.75x20 & 3.0x8 Promus Premier DES'), PTCA OM2; c. 07/2015 Cath/Staged PCI: LM 60, LAD 75d, LCX 6162m ISR, OM1 ok, OM2 25 ISR, RCA 50p, 7175m/d, 85d (3.0x28 Promus Premier DES).   COVID 2 months ago   Diastolic dysfunction    a. 05/2015 Echo: EF 50-55%, severe lateral HK. Gr1 DD, mildly dil LA. Mildly reduced RV fxn.   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    "got booster shot a few times"   GERD (gastroesophageal reflux disease)    Hyperlipidemia    Hypertensive heart disease    Menopausal state    Morbid obesity (HCC)    Noncompliance    Sleep apnea     Surgical History:  Past Surgical History:  Procedure Laterality Date   CARDIAC CATHETERIZATION N/A 06/08/2015   Procedure: Left Heart Cath and Coronary Angiography;  Surgeon: Lyn RecordsHenry W Smith, MD;  [facility];  Service: Cardiovascular;  Laterality: N/A;   CARDIAC CATHETERIZATION N/A 06/08/2015   Procedure: Coronary Stent Intervention;  Surgeon: Lyn RecordsHenry W Smith, MD;  [facility];  Service: Cardiovascular;  Laterality: N/A;   CARDIAC CATHETERIZATION N/A 07/16/2015   Procedure: Coronary Stent Intervention;  Surgeon: Lyn RecordsHenry W Smith, MD;  [facility];  Service: Cardiovascular;  Laterality: N/A;   COLONOSCOPY N/A 02/17/2021   Procedure: COLONOSCOPY;  Surgeon: Midge MiniumWohl, Darren, MD;  Location: [facility] ENDOSCOPY;  Service: Endoscopy;  Laterality: N/A;   CORONARY ANGIOPLASTY WITH STENT PLACEMENT     "6 stents before 05/2015"   KNEE ARTHROSCOPY Left 1990's   TUBAL LIGATION  ~ 1978    Medications:  Current Outpatient Medications on File Prior to Visit  Medication Sig   aspirin EC 81 MG tablet Take 81 mg by mouth daily.   cholecalciferol (VITAMIN D3) 25 MCG (1000 UNIT) tablet Take 1,000 Units by mouth daily.   clopidogrel (PLAVIX) 75 MG tablet Take 1 tablet by mouth once daily   diclofenac Sodium (VOLTAREN) 1 %  GEL Apply 2 g topically 4 (four) times daily. Apply to right knee   famotidine (PEPCID) 20 MG tablet Take 1 tablet (20 mg total) by mouth 2 (two) times daily.   nitroGLYCERIN (NITROSTAT) 0.4 MG SL tablet DISSOLVE ONE TABLET UNDER THE TONGUE EVERY 5 MINUTES AS NEEDED FOR CHEST PAIN.  DO NOT EXCEED A TOTAL OF 3 DOSES IN 15 MINUTES   potassium chloride SA (KLOR-CON) 20 MEQ tablet Take 1 tablet by mouth once daily   UNABLE  TO FIND Neo-Cell   No current facility-administered medications on file prior to visit.    Allergies:  Allergies  Allergen Reactions   Brilinta [Ticagrelor] Shortness Of Breath   Lipitor [Atorvastatin] Itching    Mouth itching, cough   Latex Other (See Comments)    Gloves make hands look black after prolonged use   Penicillins Itching and Swelling    Tongue itching and lip swelling Has patient had a PCN reaction causing immediate rash, facial/tongue/throat swelling, SOB or lightheadedness with hypotension: yes Has patient had a PCN reaction causing severe rash involving mucus membranes or skin necrosis: No Has patient had a PCN reaction that required hospitalization No Has patient had a PCN reaction occurring within the last 10 years: No If all of the above answers are "NO", then may proceed with Cephalosporin use.   Tekturna [Aliskiren] Rash    Social History:  Social History   Socioeconomic History   Marital status: Widowed    Spouse name: Not on file   Number of children: Not on file   Years of education: Not on file   Highest education level: 11th grade  Occupational History   Occupation: retired  Tobacco Use   Smoking status: Never   Smokeless tobacco: Never  Vaping Use   Vaping Use: Never used  Substance and Sexual Activity   Alcohol use: No   Drug use: No   Sexual activity: Not Currently    Birth control/protection: Surgical  Other Topics Concern   Not on file  Social History Narrative   Not on file   Social Determinants of Health    Financial Resource Strain: Medium Risk (05/04/2022)   Overall Financial Resource Strain (CARDIA)    Difficulty of Paying Living Expenses: Somewhat hard  Food Insecurity: Food Insecurity Present (05/01/2021)   Hunger Vital Sign    Worried About Running Out of Food in the Last Year: Sometimes true    Ran Out of Food in the Last Year: Sometimes true  Transportation Needs: No Transportation Needs (05/04/2022)   PRAPARE - Administrator, Civil Service (Medical): No    Lack of Transportation (Non-Medical): No  Physical Activity: Inactive (05/04/2022)   Exercise Vital Sign    Days of Exercise per Week: 0 days    Minutes of Exercise per Session: 0 min  Stress: No Stress Concern Present (05/04/2022)   [employer] of Occupational Health - Occupational Stress Questionnaire    Feeling of Stress : Not at all  Social Connections: Moderately Isolated (05/04/2022)   Social Connection and Isolation Panel [NHANES]    Frequency of Communication with Friends and Family: Three times a week    Frequency of Social Gatherings with Friends and Family: Three times a week    Attends Religious Services: More than 4 times per year    Active Member of Clubs or Organizations: No    Attends Banker Meetings: Never    Marital Status: Widowed  Intimate Partner Violence: Not At Risk (05/04/2022)   Humiliation, Afraid, Rape, and Kick questionnaire    Fear of Current or Ex-Partner: No    Emotionally Abused: No    Physically Abused: No    Sexually Abused: No   Social History   Tobacco Use  Smoking Status Never  Smokeless Tobacco Never   Social History   Substance and Sexual Activity  Alcohol Use No    Family History:  Family History  Problem Relation Age of Onset   Healthy Mother  Prostate cancer Father    Kidney disease Father    Healthy Brother    Cancer Neg Hx    Diabetes Neg Hx    Heart disease Neg Hx     Past medical history, surgical history, medications, allergies,  family history and social history reviewed with patient today and changes made to appropriate areas of the chart.   ROS All other ROS negative except what is listed above and in the HPI.      Objective:    BP 128/81   Pulse 84   Temp 98 F (36.7 C) (Oral)   Ht 5\' 2"  (1.575 m)   Wt 233 lb 9.6 oz (106 kg)   LMP  (LMP Unknown)   SpO2 98%   BMI 42.73 kg/m   Wt Readings from Last 3 Encounters:  06/07/22 233 lb 9.6 oz (106 kg)  04/29/22 235 lb (106.6 kg)  03/25/22 237 lb 14.4 oz (107.9 kg)    Physical Exam Vitals and nursing note reviewed.  Constitutional:      General: She is awake. She is not in acute distress.    Appearance: She is well-developed and well-groomed. She is obese. She is not ill-appearing or toxic-appearing.  HENT:     Head: Normocephalic and atraumatic.     Right Ear: Hearing, tympanic membrane, ear canal and external ear normal. No drainage.     Left Ear: Hearing, tympanic membrane, ear canal and external ear normal. No drainage.     Nose: Nose normal.     Right Sinus: No maxillary sinus tenderness or frontal sinus tenderness.     Left Sinus: No maxillary sinus tenderness or frontal sinus tenderness.     Mouth/Throat:     Mouth: Mucous membranes are moist.     Pharynx: Oropharynx is clear. Uvula midline. No pharyngeal swelling, oropharyngeal exudate or posterior oropharyngeal erythema.  Eyes:     General: Lids are normal.        Right eye: No discharge.        Left eye: No discharge.     Extraocular Movements: Extraocular movements intact.     Conjunctiva/sclera: Conjunctivae normal.     Pupils: Pupils are equal, round, and reactive to light.     Visual Fields: Right eye visual fields normal and left eye visual fields normal.  Neck:     Thyroid: No thyromegaly.     Vascular: No carotid bruit.     Trachea: Trachea normal.  Cardiovascular:     Rate and Rhythm: Normal rate and regular rhythm.     Heart sounds: Normal heart sounds. No murmur heard.    No  gallop.  Pulmonary:     Effort: Pulmonary effort is normal. No accessory muscle usage or respiratory distress.     Breath sounds: Normal breath sounds.  Abdominal:     General: Bowel sounds are normal.     Palpations: Abdomen is soft. There is no hepatomegaly or splenomegaly.     Tenderness: There is no abdominal tenderness.  Musculoskeletal:        General: Normal range of motion.     Cervical back: Normal range of motion and neck supple.     Right lower leg: No edema.     Left lower leg: No edema.  Lymphadenopathy:     Head:     Right side of head: No submental, submandibular, tonsillar, preauricular or posterior auricular adenopathy.     Left side of head: No submental, submandibular, tonsillar, preauricular or posterior  auricular adenopathy.     Cervical: No cervical adenopathy.  Skin:    General: Skin is warm and dry.     Capillary Refill: Capillary refill takes less than 2 seconds.     Findings: No rash.  Neurological:     Mental Status: She is alert and oriented to person, place, and time.     Gait: Gait is intact.     Deep Tendon Reflexes: Reflexes are normal and symmetric.     Reflex Scores:      Brachioradialis reflexes are 2+ on the right side and 2+ on the left side.      Patellar reflexes are 2+ on the right side and 2+ on the left side. Psychiatric:        Attention and Perception: Attention normal.        Mood and Affect: Mood normal.        Speech: Speech normal.        Behavior: Behavior normal. Behavior is cooperative.        Thought Content: Thought content normal.        Judgment: Judgment normal.    Results for orders placed or performed in visit on 02/17/22  ECHOCARDIOGRAM COMPLETE  Result Value Ref Range   Weight 3,792 oz   Height 62 in   Area-P 1/2 2.74 cm2   S' Lateral 3.20 cm   AV Area mean vel 2.07 cm2   AR max vel 2.19 cm2   AV Area VTI 2.25 cm2   Ao pk vel 1.91 m/s   AV Mean grad 8.4 mmHg   AV Peak grad 14.7 mmHg      Assessment &  Plan:   Problem List Items Addressed This Visit       Cardiovascular and Mediastinum   Aortic atherosclerosis (HCC)    Noted on CXR 04/16/21. Check lipid panel today.  Continue preventative statin and ASA.      Relevant Medications   amLODipine (NORVASC) 10 MG tablet   hydrochlorothiazide (HYDRODIURIL) 25 MG tablet   isosorbide mononitrate (IMDUR) 60 MG 24 hr tablet   losartan (COZAAR) 100 MG tablet   spironolactone (ALDACTONE) 25 MG tablet   rosuvastatin (CRESTOR) 40 MG tablet   Atherosclerotic heart disease of native coronary artery with other forms of angina pectoris (HCC)    Chronic, ongoing.  Continue current medication regimen and collaboration with cardiology.  No recent NTG use, will send in refill as needed.      Relevant Medications   amLODipine (NORVASC) 10 MG tablet   hydrochlorothiazide (HYDRODIURIL) 25 MG tablet   isosorbide mononitrate (IMDUR) 60 MG 24 hr tablet   losartan (COZAAR) 100 MG tablet   spironolactone (ALDACTONE) 25 MG tablet   rosuvastatin (CRESTOR) 40 MG tablet   Other Relevant Orders   Comprehensive metabolic panel   Chronic diastolic heart failure (HCC) - Primary    Chronic, ongoing.  Euvolemic on exam today.  Continue current medication regimen and collaboration with cardiology.  Discussed diet choices and to cut back on sodium intake, she eats a higher sodium diet with lots of spicier foods.  Is not taking Metoprolol, did not tolerate. - Reminded to call for an overnight weight gain of >2 pounds or a weekly weight weight of >5 pounds - not adding salt to his food and has been reading food labels. Reviewed the importance of keeping daily sodium intake to 2000mg  daily  - No Ibuprofen products      Relevant Medications   amLODipine (NORVASC)  10 MG tablet   hydrochlorothiazide (HYDRODIURIL) 25 MG tablet   isosorbide mononitrate (IMDUR) 60 MG 24 hr tablet   losartan (COZAAR) 100 MG tablet   spironolactone (ALDACTONE) 25 MG tablet   rosuvastatin  (CRESTOR) 40 MG tablet   Other Relevant Orders   Comprehensive metabolic panel   Essential hypertension    Chronic, ongoing.  BP at goal in office today, took all medication this morning.  Is cutting back on sodium.  Does not consistently take medication.  Recommend ensuring medication taken daily + checking BP at home at least a few mornings a week.  Continue collaboration with cardiology and review notes.  Continue current medication regimen at this time, suspect adjustments will need to be made in future.  Labs: CMP and lipid.  Refills sent in.      Relevant Medications   amLODipine (NORVASC) 10 MG tablet   hydrochlorothiazide (HYDRODIURIL) 25 MG tablet   isosorbide mononitrate (IMDUR) 60 MG 24 hr tablet   losartan (COZAAR) 100 MG tablet   spironolactone (ALDACTONE) 25 MG tablet   rosuvastatin (CRESTOR) 40 MG tablet   Other Relevant Orders   Comprehensive metabolic panel   Old MI (myocardial infarction)    In 2004, continue current medication regimen for prevention and collaboration with cardiology.      Relevant Medications   amLODipine (NORVASC) 10 MG tablet   hydrochlorothiazide (HYDRODIURIL) 25 MG tablet   isosorbide mononitrate (IMDUR) 60 MG 24 hr tablet   losartan (COZAAR) 100 MG tablet   spironolactone (ALDACTONE) 25 MG tablet   rosuvastatin (CRESTOR) 40 MG tablet   SVT (supraventricular tachycardia)    Followed by cardiology, continue this collaboration.  Is not taking Metoprolol, reports she has tried off and on for years and did not tolerate.      Relevant Medications   amLODipine (NORVASC) 10 MG tablet   hydrochlorothiazide (HYDRODIURIL) 25 MG tablet   isosorbide mononitrate (IMDUR) 60 MG 24 hr tablet   losartan (COZAAR) 100 MG tablet   spironolactone (ALDACTONE) 25 MG tablet   rosuvastatin (CRESTOR) 40 MG tablet   Other Relevant Orders   Comprehensive metabolic panel     Digestive   GERD without esophagitis    Ongoing issue, stable at this time with diet  changes.  No medications at this time.  If ongoing issues will consider a referral to GI for further assessment.        Other   BMI 40.0-44.9, adult [facility])    Refer to morbid obesity plan of care.      Elevated hemoglobin A1c    A1c check today and urine ALB is 27 December 2021, continue Losartan for kidney protection and recommend patient focus heavily on diet changes and regular activity.      Relevant Orders   HgB A1c   Hyperlipidemia    Chronic, ongoing.  Continue current medication regimen and adjust as needed.  Lipid panel today.  Have recommended she take this daily to help with prevention.  Discussed this at length with patient.      Relevant Medications   amLODipine (NORVASC) 10 MG tablet   hydrochlorothiazide (HYDRODIURIL) 25 MG tablet   isosorbide mononitrate (IMDUR) 60 MG 24 hr tablet   losartan (COZAAR) 100 MG tablet   spironolactone (ALDACTONE) 25 MG tablet   rosuvastatin (CRESTOR) 40 MG tablet   Other Relevant Orders   Comprehensive metabolic panel   Lipid Panel w/o Chol/HDL Ratio   Morbid obesity (HCC)    BMI 42.73 with HTN,  OLD MI, HLD. Recommended eating smaller high protein, low fat meals more frequently and exercising 30 mins a day 5 times a week with a goal of 10-15lb weight loss in the next 3 months. Patient voiced their understanding and motivation to adhere to these recommendations.       Noncompliance with medication regimen    Ongoing issue, will continue to involve CCM team to assist.      Vitamin D deficiency    Ongoing, recommend continue daily supplement and adjust as needed.  Recheck level next visit.  Recommend she obtain DEXA scan, is scheduled upcoming.      Other Visit Diagnoses     Flu vaccine need       Flu vaccine in office today.   Relevant Orders   Flu Vaccine QUAD High Dose(Fluad)   Encounter for annual physical exam       Annual physical today with labs and health maintenance reviewed, discussed with patient.        Follow  up plan: Return in about 6 months (around 12/07/2022) for HTN/HLD and HF.   LABORATORY TESTING:  - Pap smear: not applicable  IMMUNIZATIONS:   - Tdap: Tetanus vaccination status reviewed: last tetanus booster within 10 years. - Influenza: Administered today - Pneumovax: Up to date - Prevnar: Up to date - COVID: Refused - HPV: Not applicable - Shingrix vaccine: Refused  SCREENING: -Mammogram: Up to date  - Colonoscopy: Up to date  - Bone Density: Up to date  -Hearing Test: Not applicable  -Spirometry: Not applicable   PATIENT COUNSELING:   Advised to take 1 mg of folate supplement per day if capable of pregnancy.   Sexuality: Discussed sexually transmitted diseases, partner selection, use of condoms, avoidance of unintended pregnancy  and contraceptive alternatives.   Advised to avoid cigarette smoking.  I discussed with the patient that most people either abstain from alcohol or drink within safe limits (<=14/week and <=4 drinks/occasion for males, <=7/weeks and <= 3 drinks/occasion for females) and that the risk for alcohol disorders and other health effects rises proportionally with the number of drinks per week and how often a drinker exceeds daily limits.  Discussed cessation/primary prevention of drug use and availability of treatment for abuse.   Diet: Encouraged to adjust caloric intake to maintain  or achieve ideal body weight, to reduce intake of dietary saturated fat and total fat, to limit sodium intake by avoiding high sodium foods and not adding table salt, and to maintain adequate dietary potassium and calcium preferably from fresh fruits, vegetables, and low-fat dairy products.    Stressed the importance of regular exercise  Injury prevention: Discussed safety belts, safety helmets, smoke detector, smoking near bedding or upholstery.   Dental health: Discussed importance of regular tooth brushing, flossing, and dental visits.    NEXT PREVENTATIVE PHYSICAL DUE  IN 1 YEAR. Return in about 6 months (around 12/07/2022) for HTN/HLD and HF.

## 2022-06-07 NOTE — Assessment & Plan Note (Signed)
Ongoing, recommend continue daily supplement and adjust as needed.  Recheck level next visit.  Recommend she obtain DEXA scan, is scheduled upcoming.

## 2022-06-07 NOTE — Assessment & Plan Note (Signed)
Chronic, ongoing.  Continue current medication regimen and collaboration with cardiology.  No recent NTG use, will send in refill as needed. 

## 2022-06-07 NOTE — Assessment & Plan Note (Signed)
Refer to morbid obesity plan of care. 

## 2022-06-07 NOTE — Assessment & Plan Note (Signed)
Chronic, ongoing.  Continue current medication regimen and adjust as needed.  Lipid panel today.  Have recommended she take this daily to help with prevention.  Discussed this at length with patient. 

## 2022-06-07 NOTE — Assessment & Plan Note (Signed)
Ongoing issue, will continue to involve CCM team to assist. 

## 2022-06-07 NOTE — Assessment & Plan Note (Signed)
Noted on CXR 04/16/21. Check lipid panel today.  Continue preventative statin and ASA.

## 2022-06-07 NOTE — Assessment & Plan Note (Signed)
Chronic, ongoing.  Euvolemic on exam today.  Continue current medication regimen and collaboration with cardiology.  Discussed diet choices and to cut back on sodium intake, she eats a higher sodium diet with lots of spicier foods.  Is not taking Metoprolol, did not tolerate. - Reminded to call for an overnight weight gain of >2 pounds or a weekly weight weight of >5 pounds - not adding salt to his food and has been reading food labels. Reviewed the importance of keeping daily sodium intake to 2000mg  daily  - No Ibuprofen products

## 2022-06-07 NOTE — Assessment & Plan Note (Signed)
In 2004, continue current medication regimen for prevention and collaboration with cardiology.

## 2022-06-07 NOTE — Assessment & Plan Note (Signed)
BMI 42.73 with HTN, OLD MI, HLD. Recommended eating smaller high protein, low fat meals more frequently and exercising 30 mins a day 5 times a week with a goal of 10-15lb weight loss in the next 3 months. Patient voiced their understanding and motivation to adhere to these recommendations.

## 2022-06-07 NOTE — Assessment & Plan Note (Signed)
Ongoing issue, stable at this time with diet changes.  No medications at this time.  If ongoing issues will consider a referral to GI for further assessment.

## 2022-06-07 NOTE — Assessment & Plan Note (Signed)
Followed by cardiology, continue this collaboration.  Is not taking Metoprolol, reports she has tried off and on for years and did not tolerate. 

## 2022-06-07 NOTE — Assessment & Plan Note (Signed)
Chronic, ongoing.  BP at goal in office today, took all medication this morning.  Is cutting back on sodium.  Does not consistently take medication.  Recommend ensuring medication taken daily + checking BP at home at least a few mornings a week.  Continue collaboration with cardiology and review notes.  Continue current medication regimen at this time, suspect adjustments will need to be made in future.  Labs: CMP and lipid.  Refills sent in.

## 2022-06-08 LAB — LIPID PANEL W/O CHOL/HDL RATIO
Cholesterol, Total: 191 mg/dL (ref 100–199)
HDL: 39 mg/dL — ABNORMAL LOW (ref >39)
LDL Chol Calc (NIH): 126 mg/dL — ABNORMAL HIGH (ref 0–99)
Triglycerides: 146 mg/dL (ref 0–149)
VLDL Cholesterol Cal: 26 mg/dL (ref 5–40)

## 2022-06-08 LAB — COMPREHENSIVE METABOLIC PANEL
ALT: 15 IU/L (ref 0–32)
AST: 24 IU/L (ref 0–40)
Albumin/Globulin Ratio: 1.4 (ref 1.2–2.2)
Albumin: 4.6 g/dL (ref 3.8–4.8)
Alkaline Phosphatase: 63 IU/L (ref 44–121)
BUN/Creatinine Ratio: 16 (ref 12–28)
BUN: 11 mg/dL (ref 8–27)
Bilirubin Total: 0.6 mg/dL (ref 0.0–1.2)
CO2: 23 mmol/L (ref 20–29)
Calcium: 10.3 mg/dL (ref 8.7–10.3)
Chloride: 98 mmol/L (ref 96–106)
Creatinine, Ser: 0.67 mg/dL (ref 0.57–1.00)
Globulin, Total: 3.4 g/dL (ref 1.5–4.5)
Glucose: 89 mg/dL (ref 70–99)
Potassium: 4.2 mmol/L (ref 3.5–5.2)
Sodium: 138 mmol/L (ref 134–144)
Total Protein: 8 g/dL (ref 6.0–8.5)
eGFR: 92 mL/min/{1.73_m2} (ref >59)

## 2022-06-08 LAB — HEMOGLOBIN A1C
Est. average glucose Bld gHb Est-mCnc: 126 mg/dL
Hgb A1c MFr Bld: 6 % — ABNORMAL HIGH (ref 4.8–5.6)

## 2022-06-08 NOTE — Progress Notes (Signed)
Good morning, please let Ruba know her labs have returned.  Her cholesterol continues to show elevation in LDL -- are you taking your Rosuvastatin daily?  Please ensure you are and I highly recommend we add on Zetia with this to help lower more and prevent heart attack.  Are you okay with starting this?  I know your heart doctor recommended this too.  A1c continues to show prediabetes at 6%, continue focus on diet and activity -- next visit we may add on a medication to help this and your heart.  Kidney and liver function remain stable.  Any questions? Keep being awesome!!  Thank you for allowing me to participate in your care.  I appreciate you. Kindest regards, Neddie Steedman

## 2022-06-13 ENCOUNTER — Other Ambulatory Visit: Payer: Self-pay | Admitting: Nurse Practitioner

## 2022-06-13 DIAGNOSIS — I2510 Atherosclerotic heart disease of native coronary artery without angina pectoris: Secondary | ICD-10-CM

## 2022-06-14 NOTE — Telephone Encounter (Signed)
Requested Prescriptions  Pending Prescriptions Disp Refills  . nitroGLYCERIN (NITROSTAT) 0.4 MG SL tablet [Pharmacy Med Name: Nitroglycerin 0.4 MG Sublingual Tablet Sublingual] 25 tablet 1    Sig: DISSOLVE ONE TABLET UNDER THE TONGUE EVERY 5 MINUTES AS NEEDED FOR CHEST PAIN.  DO NOT EXCEED A TOTAL OF 3 DOSES IN 15 MINUTES     Cardiovascular:  Nitrates Passed - 06/13/2022  2:32 PM      Passed - Last BP in normal range    BP Readings from Last 1 Encounters:  06/07/22 128/81         Passed - Last Heart Rate in normal range    Pulse Readings from Last 1 Encounters:  06/07/22 84         Passed - Valid encounter within last 12 months    Recent Outpatient Visits          1 week ago Chronic diastolic heart failure ([place])   [place], Jolene T, NP   2 months ago Chronic diastolic heart failure ([place])   [place], Jolene T, NP   6 months ago Chronic diastolic heart failure ([place])   [place], Jolene T, NP   8 months ago Chronic right shoulder pain   [place] Cannady, Barbaraann Faster, NP   10 months ago Essential hypertension   [place], Scheryl Darter, NP      Future Appointments            In 4 months [place], Greer Ee, MD [place]. [facility], [facility]   In 5 months [place], Barbaraann Faster, NP [employer], [facility]           . potassium chloride SA (KLOR-CON M) 20 MEQ tablet [Pharmacy Med Name: Potassium Chloride Crys ER 20 MEQ Oral Tablet Extended Release] 90 tablet 0    Sig: TAKE 1  BY MOUTH ONCE DAILY     Endocrinology:  Minerals - Potassium Supplementation Passed - 06/13/2022  2:32 PM      Passed - K in normal range and within 360 days    Potassium  Date Value Ref Range Status  06/07/2022 4.2 3.5 - 5.2 mmol/L Final         Passed - Cr in normal range and within 360 days    Creat  Date Value Ref Range Status   07/23/2015 0.70 0.50 - 0.99 mg/dL Final   Creatinine, Ser  Date Value Ref Range Status  06/07/2022 0.67 0.57 - 1.00 mg/dL Final         Passed - Valid encounter within last 12 months    Recent Outpatient Visits          1 week ago Chronic diastolic heart failure ([place])   [place], Jolene T, NP   2 months ago Chronic diastolic heart failure ([place])   [place] Cannady, Jolene T, NP   6 months ago Chronic diastolic heart failure ([place])   [place], Jolene T, NP   8 months ago Chronic right shoulder pain   [facility] [place], Barbaraann Faster, NP   10 months ago Essential hypertension   [place], Scheryl Darter, NP      Future Appointments            In 4 months Pemberton, Greer Ee, MD [place]. [facility]  [address] [place], [facility]   In 5 months [place], Dorie Rank, NP [employer], [facility]           . clopidogrel (PLAVIX) 75 MG tablet [Pharmacy Med Name: Clopidogrel Bisulfate 75 MG Oral Tablet] 90 tablet 0    Sig: Take 1 tablet by mouth once daily     Hematology: Antiplatelets - clopidogrel Failed - 06/13/2022  2:32 PM      Failed - HCT in normal range and within 180 days    Hematocrit  Date Value Ref Range Status  06/08/2021 45.0 34.0 - 46.6 % Final         Failed - HGB in normal range and within 180 days    Hemoglobin  Date Value Ref Range Status  06/08/2021 14.3 11.1 - 15.9 g/dL Final         Failed - PLT in normal range and within 180 days    Platelets  Date Value Ref Range Status  06/08/2021 242 150 - 450 x10E3/uL Final         Passed - Cr in normal range and within 360 days    Creat  Date Value Ref Range Status  07/23/2015 0.70 0.50 - 0.99 mg/dL Final   Creatinine, Ser  Date Value Ref Range Status  06/07/2022 0.67 0.57 - 1.00 mg/dL Final         Passed - Valid encounter within last 6 months    Recent Outpatient Visits           1 week ago Chronic diastolic heart failure (HCC)   [facility] [place], Jolene T, NP   2 months ago Chronic diastolic heart failure (HCC)   [facility] [place], Jolene T, NP   6 months ago Chronic diastolic heart failure (HCC)   [facility] [place], Jolene T, NP   8 months ago Chronic right shoulder pain   [facility] [place], Corrie Dandy T, NP   10 months ago Essential hypertension   [facility] McElwe[place], Jake [place], NP      Future Appointments            In 4 months Pemberton, Kathlynn Grate, MD [facility] [address] A Dept Of [place]. [facility], [facility]   In 5 months [place], Dorie Rank, NP [employer], [facility]

## 2022-06-14 NOTE — Telephone Encounter (Signed)
Requested medications are due for refill today.  yes  Requested medications are on the active medications list.  yes  Last refill. 01/15/2021 #90 4 rf  Future visit scheduled.   yes  Notes to clinic.  Labs are expired.    Requested Prescriptions  Pending Prescriptions Disp Refills   clopidogrel (PLAVIX) 75 MG tablet [Pharmacy Med Name: Clopidogrel Bisulfate 75 MG Oral Tablet] 90 tablet 0    Sig: Take 1 tablet by mouth once daily     Hematology: Antiplatelets - clopidogrel Failed - 06/13/2022  2:32 PM      Failed - HCT in normal range and within 180 days    Hematocrit  Date Value Ref Range Status  06/08/2021 45.0 34.0 - 46.6 % Final         Failed - HGB in normal range and within 180 days    Hemoglobin  Date Value Ref Range Status  06/08/2021 14.3 11.1 - 15.9 g/dL Final         Failed - PLT in normal range and within 180 days    Platelets  Date Value Ref Range Status  06/08/2021 242 150 - 450 x10E3/uL Final         Passed - Cr in normal range and within 360 days    Creat  Date Value Ref Range Status  07/23/2015 0.70 0.50 - 0.99 mg/dL Final   Creatinine, Ser  Date Value Ref Range Status  06/07/2022 0.67 0.57 - 1.00 mg/dL Final         Passed - Valid encounter within last 6 months    Recent Outpatient Visits           1 week ago Chronic diastolic heart failure ([place])   [place], Jolene T, NP   2 months ago Chronic diastolic heart failure ([place])   [place], Jolene T, NP   6 months ago Chronic diastolic heart failure ([place])   [place], Jolene T, NP   8 months ago Chronic right shoulder pain   [place], Henrine Screws T, NP   10 months ago Essential hypertension   [place], Scheryl Darter, NP       Future Appointments             In 4 months Pemberton, Greer Ee, MD [place]. [facility], [facility]   In 5  months [place], Isabela T, NP [employer], [facility]            Signed Prescriptions Disp Refills   nitroGLYCERIN (NITROSTAT) 0.4 MG SL tablet 25 tablet 1    Sig: DISSOLVE ONE TABLET UNDER THE TONGUE EVERY 5 MINUTES AS NEEDED FOR CHEST PAIN.  DO NOT EXCEED A TOTAL OF 3 DOSES IN 15 MINUTES     Cardiovascular:  Nitrates Passed - 06/13/2022  2:32 PM      Passed - Last BP in normal range    BP Readings from Last 1 Encounters:  06/07/22 128/81         Passed - Last Heart Rate in normal range    Pulse Readings from Last 1 Encounters:  06/07/22 84         Passed - Valid encounter within last 12 months    Recent Outpatient Visits           1 week ago Chronic diastolic heart failure [facility])   [facility] [place], [place] T,  NP   2 months ago Chronic diastolic heart failure ([place])   [place], Dearing T, NP   6 months ago Chronic diastolic heart failure ([place])   [place], [place] T, NP   8 months ago Chronic right shoulder pain   [place], Barbaraann Faster, NP   10 months ago Essential hypertension   [place], Scheryl Darter, NP       Future Appointments             In 4 months Pember[place], Greer Ee, MD [place]. [facility], [facility]   In 5 months [place], [place] T, NP [employer], [facility]             potassium chloride SA (KLOR-CON M) 20 MEQ tablet 90 tablet 3    Sig: TAKE 1  BY MOUTH ONCE DAILY     Endocrinology:  Minerals - Potassium Supplementation Passed - 06/13/2022  2:32 PM      Passed - K in normal range and within 360 days    Potassium  Date Value Ref Range Status  06/07/2022 4.2 3.5 - 5.2 mmol/L Final         Passed - Cr in normal range and within 360 days    Creat  Date Value Ref Range Status  07/23/2015 0.70 0.50 - 0.99 mg/dL Final   Creatinine, Ser  Date Value Ref Range Status  06/07/2022 0.67  0.57 - 1.00 mg/dL Final         Passed - Valid encounter within last 12 months    Recent Outpatient Visits           1 week ago Chronic diastolic heart failure ([place])   [place], Jolene T, NP   2 months ago Chronic diastolic heart failure ([place])   [place], Jolene T, NP   6 months ago Chronic diastolic heart failure ([place])   [place], Jolene T, NP   8 months ago Chronic right shoulder pain   [place], Barbaraann Faster, NP   10 months ago Essential hypertension   [place], Scheryl Darter, NP       Future Appointments             In 4 months [place], Greer Ee, MD [place]. [facility], [facility]   In 5 months [place], Barbaraann Faster, NP [employer], [facility]

## 2022-06-15 ENCOUNTER — Ambulatory Visit
Admission: RE | Admit: 2022-06-15 | Discharge: 2022-06-15 | Disposition: A | Discharge status: Home / Self Care | Payer: Medicare Other | Source: Ambulatory Visit | Attending: Nurse Practitioner | Admitting: Nurse Practitioner

## 2022-06-15 DIAGNOSIS — Z1231 Encounter for screening mammogram for malignant neoplasm of breast: Secondary | ICD-10-CM | POA: Insufficient documentation

## 2022-06-15 DIAGNOSIS — Z780 Asymptomatic menopausal state: Secondary | ICD-10-CM | POA: Diagnosis not present

## 2022-06-15 NOTE — Progress Notes (Signed)
Please let Hildegard know her bone density returned and is normal.  Do not need to repeat for 10 years:)  [place] news!!

## 2022-06-16 NOTE — Progress Notes (Signed)
Please let Jennifer Grant know her mammogram was normal and we can repeat in one year!!  [place] news!!

## 2022-06-22 ENCOUNTER — Emergency Department: Payer: Medicare Other

## 2022-06-22 ENCOUNTER — Observation Stay
Admission: EM | Admit: 2022-06-22 | Discharge: 2022-06-23 | Disposition: A | Discharge status: Home / Self Care | Payer: Medicare Other | Attending: Hospitalist | Admitting: Hospitalist

## 2022-06-22 ENCOUNTER — Observation Stay (HOSPITAL_BASED_OUTPATIENT_CLINIC_OR_DEPARTMENT_OTHER)
Admit: 2022-06-22 | Discharge: 2022-06-22 | Disposition: A | Discharge status: Home / Self Care | Payer: Medicare Other | Attending: Internal Medicine | Admitting: Internal Medicine

## 2022-06-22 ENCOUNTER — Other Ambulatory Visit: Payer: Self-pay

## 2022-06-22 ENCOUNTER — Observation Stay: Payer: Medicare Other

## 2022-06-22 DIAGNOSIS — G459 Transient cerebral ischemic attack, unspecified: Secondary | ICD-10-CM | POA: Diagnosis not present

## 2022-06-22 DIAGNOSIS — I2510 Atherosclerotic heart disease of native coronary artery without angina pectoris: Secondary | ICD-10-CM | POA: Diagnosis not present

## 2022-06-22 DIAGNOSIS — I10 Essential (primary) hypertension: Secondary | ICD-10-CM | POA: Diagnosis present

## 2022-06-22 DIAGNOSIS — Z8616 Personal history of COVID-19: Secondary | ICD-10-CM | POA: Diagnosis not present

## 2022-06-22 DIAGNOSIS — I5032 Chronic diastolic (congestive) heart failure: Secondary | ICD-10-CM | POA: Diagnosis not present

## 2022-06-22 DIAGNOSIS — R202 Paresthesia of skin: Secondary | ICD-10-CM | POA: Insufficient documentation

## 2022-06-22 DIAGNOSIS — Z91040 Latex allergy status: Secondary | ICD-10-CM | POA: Insufficient documentation

## 2022-06-22 DIAGNOSIS — I5030 Unspecified diastolic (congestive) heart failure: Secondary | ICD-10-CM | POA: Diagnosis present

## 2022-06-22 DIAGNOSIS — Z955 Presence of coronary angioplasty implant and graft: Secondary | ICD-10-CM | POA: Diagnosis not present

## 2022-06-22 DIAGNOSIS — I480 Paroxysmal atrial fibrillation: Secondary | ICD-10-CM | POA: Insufficient documentation

## 2022-06-22 DIAGNOSIS — Z6841 Body Mass Index (BMI) 40.0 and over, adult: Secondary | ICD-10-CM

## 2022-06-22 DIAGNOSIS — I639 Cerebral infarction, unspecified: Secondary | ICD-10-CM | POA: Diagnosis not present

## 2022-06-22 DIAGNOSIS — Z7982 Long term (current) use of aspirin: Secondary | ICD-10-CM | POA: Diagnosis not present

## 2022-06-22 DIAGNOSIS — Z79899 Other long term (current) drug therapy: Secondary | ICD-10-CM | POA: Diagnosis not present

## 2022-06-22 DIAGNOSIS — Z7902 Long term (current) use of antithrombotics/antiplatelets: Secondary | ICD-10-CM | POA: Insufficient documentation

## 2022-06-22 DIAGNOSIS — E785 Hyperlipidemia, unspecified: Secondary | ICD-10-CM | POA: Diagnosis present

## 2022-06-22 DIAGNOSIS — I700 Atherosclerosis of aorta: Secondary | ICD-10-CM | POA: Diagnosis present

## 2022-06-22 DIAGNOSIS — I471 Supraventricular tachycardia, unspecified: Secondary | ICD-10-CM

## 2022-06-22 DIAGNOSIS — K219 Gastro-esophageal reflux disease without esophagitis: Secondary | ICD-10-CM | POA: Diagnosis present

## 2022-06-22 DIAGNOSIS — R2990 Unspecified symptoms and signs involving the nervous system: Secondary | ICD-10-CM

## 2022-06-22 DIAGNOSIS — E66813 Obesity, class 3: Secondary | ICD-10-CM

## 2022-06-22 DIAGNOSIS — R29818 Other symptoms and signs involving the nervous system: Secondary | ICD-10-CM | POA: Diagnosis not present

## 2022-06-22 DIAGNOSIS — R079 Chest pain, unspecified: Secondary | ICD-10-CM | POA: Diagnosis not present

## 2022-06-22 DIAGNOSIS — Z0389 Encounter for observation for other suspected diseases and conditions ruled out: Secondary | ICD-10-CM | POA: Diagnosis not present

## 2022-06-22 DIAGNOSIS — I110 Hypertensive heart disease with heart failure: Secondary | ICD-10-CM | POA: Diagnosis not present

## 2022-06-22 HISTORY — DX: Transient cerebral ischemic attack, unspecified: G45.9

## 2022-06-22 LAB — CBC WITH DIFFERENTIAL/PLATELET
Abs Immature Granulocytes: 0.01 10*3/uL (ref 0.00–0.07)
Basophils Absolute: 0 10*3/uL (ref 0.0–0.1)
Basophils Relative: 1 %
Eosinophils Absolute: 0.1 10*3/uL (ref 0.0–0.5)
Eosinophils Relative: 2 %
HCT: 43.9 % (ref 36.0–46.0)
Hemoglobin: 13.7 g/dL (ref 12.0–15.0)
Immature Granulocytes: 0 %
Lymphocytes Relative: 29 %
Lymphs Abs: 1.6 10*3/uL (ref 0.7–4.0)
MCH: 25.4 pg — ABNORMAL LOW (ref 26.0–34.0)
MCHC: 31.2 g/dL (ref 30.0–36.0)
MCV: 81.3 fL (ref 80.0–100.0)
Monocytes Absolute: 0.4 10*3/uL (ref 0.1–1.0)
Monocytes Relative: 8 %
Neutro Abs: 3.4 10*3/uL (ref 1.7–7.7)
Neutrophils Relative %: 60 %
Platelets: 224 10*3/uL (ref 150–400)
RBC: 5.4 MIL/uL — ABNORMAL HIGH (ref 3.87–5.11)
RDW: 14.7 % (ref 11.5–15.5)
WBC: 5.6 10*3/uL (ref 4.0–10.5)
nRBC: 0 % (ref 0.0–0.2)

## 2022-06-22 LAB — URINALYSIS, ROUTINE W REFLEX MICROSCOPIC
Bilirubin Urine: NEGATIVE
Glucose, UA: NEGATIVE mg/dL
Ketones, ur: NEGATIVE mg/dL
Nitrite: NEGATIVE
Protein, ur: NEGATIVE mg/dL
Specific Gravity, Urine: 1.006 (ref 1.005–1.030)
pH: 7 (ref 5.0–8.0)

## 2022-06-22 LAB — BASIC METABOLIC PANEL
Anion gap: 11 (ref 5–15)
BUN: 13 mg/dL (ref 8–23)
CO2: 25 mmol/L (ref 22–32)
Calcium: 10.1 mg/dL (ref 8.9–10.3)
Chloride: 99 mmol/L (ref 98–111)
Creatinine, Ser: 0.74 mg/dL (ref 0.44–1.00)
GFR, Estimated: >60 mL/min (ref >60)
Glucose, Bld: 89 mg/dL (ref 70–99)
Potassium: 3.7 mmol/L (ref 3.5–5.1)
Sodium: 135 mmol/L (ref 135–145)

## 2022-06-22 LAB — ETHANOL: Alcohol, Ethyl (B): <10 mg/dL (ref <10)

## 2022-06-22 LAB — PROTIME-INR
INR: 1.1 (ref 0.8–1.2)
Prothrombin Time: 14.1 seconds (ref 11.4–15.2)

## 2022-06-22 LAB — URINE DRUG SCREEN, QUALITATIVE (ARMC ONLY)
Amphetamines, Ur Screen: NOT DETECTED
Barbiturates, Ur Screen: NOT DETECTED
Benzodiazepine, Ur Scrn: NOT DETECTED
Cannabinoid 50 Ng, Ur ~~LOC~~: NOT DETECTED
Cocaine Metabolite,Ur ~~LOC~~: NOT DETECTED
MDMA (Ecstasy)Ur Screen: NOT DETECTED
Methadone Scn, Ur: NOT DETECTED
Opiate, Ur Screen: NOT DETECTED
Phencyclidine (PCP) Ur S: NOT DETECTED
Tricyclic, Ur Screen: NOT DETECTED

## 2022-06-22 LAB — CBG MONITORING, ED: Glucose-Capillary: 78 mg/dL (ref 70–99)

## 2022-06-22 LAB — APTT: aPTT: 31 seconds (ref 24–36)

## 2022-06-22 LAB — TROPONIN I (HIGH SENSITIVITY)
Troponin I (High Sensitivity): 12 ng/L (ref <18)
Troponin I (High Sensitivity): 15 ng/L (ref <18)

## 2022-06-22 MED ORDER — ACETAMINOPHEN 160 MG/5ML PO SOLN: 650.0000 mg | ORAL | Status: DC | PRN

## 2022-06-22 MED ORDER — SODIUM CHLORIDE 0.9 % IV SOLN: INTRAVENOUS | Status: DC

## 2022-06-22 MED ORDER — IOHEXOL 350 MG/ML SOLN
75.0000 mL | Freq: Once | INTRAVENOUS | Status: AC | PRN
  Administered 2022-06-22: 75 mL via INTRAVENOUS

## 2022-06-22 MED ORDER — ACETAMINOPHEN 650 MG RE SUPP: 650.0000 mg | RECTAL | Status: DC | PRN

## 2022-06-22 MED ORDER — STROKE: EARLY STAGES OF RECOVERY BOOK: Freq: Once | Status: DC

## 2022-06-22 MED ORDER — ROSUVASTATIN CALCIUM 20 MG PO TABS
40.0000 mg | ORAL_TABLET | Freq: Every day | ORAL | Status: DC
  Administered 2022-06-23: 40 mg via ORAL
  Filled 2022-06-22: qty 2

## 2022-06-22 MED ORDER — LIDOCAINE 5 % EX PTCH: 1.0000 | MEDICATED_PATCH | Freq: Every day | CUTANEOUS | Status: DC | PRN

## 2022-06-22 MED ORDER — ASPIRIN 81 MG PO TBEC
81.0000 mg | DELAYED_RELEASE_TABLET | Freq: Every day | ORAL | Status: DC
  Administered 2022-06-22 – 2022-06-23 (×2): 81 mg via ORAL
  Filled 2022-06-22 (×2): qty 1

## 2022-06-22 MED ORDER — FAMOTIDINE 20 MG PO TABS
20.0000 mg | ORAL_TABLET | Freq: Two times a day (BID) | ORAL | Status: DC
  Administered 2022-06-22 – 2022-06-23 (×2): 20 mg via ORAL
  Filled 2022-06-22 (×2): qty 1

## 2022-06-22 MED ORDER — ACETAMINOPHEN 325 MG PO TABS: 650.0000 mg | ORAL_TABLET | ORAL | Status: DC | PRN

## 2022-06-22 MED ORDER — NITROGLYCERIN 0.4 MG SL SUBL: 0.4000 mg | SUBLINGUAL_TABLET | SUBLINGUAL | Status: DC | PRN

## 2022-06-22 MED ORDER — DICLOFENAC SODIUM 1 % EX GEL: 2.0000 g | Freq: Four times a day (QID) | CUTANEOUS | Status: DC | PRN

## 2022-06-22 MED ORDER — AMLODIPINE BESYLATE 10 MG PO TABS
10.0000 mg | ORAL_TABLET | Freq: Every day | ORAL | Status: DC
  Administered 2022-06-23: 10 mg via ORAL
  Filled 2022-06-22: qty 1

## 2022-06-22 MED ORDER — ENOXAPARIN SODIUM 40 MG/0.4ML IJ SOSY
40.0000 mg | PREFILLED_SYRINGE | INTRAMUSCULAR | Status: DC
  Administered 2022-06-22: 40 mg via SUBCUTANEOUS
  Filled 2022-06-22: qty 0.4

## 2022-06-22 MED ORDER — ASPIRIN 81 MG PO TBEC: 81.0000 mg | DELAYED_RELEASE_TABLET | Freq: Every day | ORAL | Status: DC

## 2022-06-22 MED ORDER — SPIRONOLACTONE 25 MG PO TABS: 25.0000 mg | ORAL_TABLET | ORAL | Status: DC

## 2022-06-22 MED ORDER — LOSARTAN POTASSIUM 50 MG PO TABS
100.0000 mg | ORAL_TABLET | Freq: Every day | ORAL | Status: DC
  Administered 2022-06-23: 100 mg via ORAL
  Filled 2022-06-22: qty 2

## 2022-06-22 MED ORDER — HYDROCHLOROTHIAZIDE 25 MG PO TABS
25.0000 mg | ORAL_TABLET | Freq: Every day | ORAL | Status: DC
  Administered 2022-06-23: 25 mg via ORAL
  Filled 2022-06-22: qty 1

## 2022-06-22 MED ORDER — POTASSIUM CHLORIDE CRYS ER 20 MEQ PO TBCR
20.0000 meq | EXTENDED_RELEASE_TABLET | Freq: Every day | ORAL | Status: DC
  Administered 2022-06-23: 20 meq via ORAL
  Filled 2022-06-22: qty 1

## 2022-06-22 MED ORDER — PERFLUTREN LIPID MICROSPHERE
1.0000 mL | INTRAVENOUS | Status: AC | PRN
  Administered 2022-06-22: 2 mL via INTRAVENOUS

## 2022-06-22 MED ORDER — SENNOSIDES-DOCUSATE SODIUM 8.6-50 MG PO TABS: 1.0000 | ORAL_TABLET | Freq: Every evening | ORAL | Status: DC | PRN

## 2022-06-22 MED ORDER — VITAMIN D 25 MCG (1000 UNIT) PO TABS
1000.0000 [IU] | ORAL_TABLET | Freq: Every day | ORAL | Status: DC
  Administered 2022-06-23: 1000 [IU] via ORAL
  Filled 2022-06-22: qty 1

## 2022-06-22 MED ORDER — ASPIRIN 81 MG PO CHEW
324.0000 mg | CHEWABLE_TABLET | Freq: Once | ORAL | Status: AC
  Administered 2022-06-22: 324 mg via ORAL
  Filled 2022-06-22: qty 4

## 2022-06-22 MED ORDER — CLOPIDOGREL BISULFATE 75 MG PO TABS
75.0000 mg | ORAL_TABLET | Freq: Every day | ORAL | Status: DC
  Administered 2022-06-23: 75 mg via ORAL
  Filled 2022-06-22: qty 1

## 2022-06-22 MED ORDER — ISOSORBIDE MONONITRATE ER 30 MG PO TB24
60.0000 mg | ORAL_TABLET | Freq: Every day | ORAL | Status: DC
  Administered 2022-06-23: 60 mg via ORAL
  Filled 2022-06-22: qty 2

## 2022-06-22 NOTE — H&P (Signed)
History and Physical    Patient: Jennifer Grant:[id number] DOB: 02-08-1951 DOA: 06/22/2022 DOS: the patient was seen and examined on 06/22/2022 PCP: Marjie Skiff, NP  Patient coming from: Home  Chief Complaint:  Chief Complaint  Patient presents with   Chest Pain   HPI: Jennifer Grant is a 71 y.o. female with medical history significant of coronary artery disease, morbid obesity, diastolic dysfunction, hyperlipidemia, obstructive sleep apnea, paroxysmal atrial fibrillation, things who presented to the ER today with sudden onset of burning feeling over the right side of her face around the ER and then went down her upper chest, right arm and into her right leg.  Symptoms mainly confined to the right side.  Patient took her medications but still had continued symptoms for about 20 to 30 minutes.  Came to the ER where she was seen and evaluated.  She did have some mild weakness of the right arm and right leg which have now all disappeared.  A code stroke was called for.  Due to patient's risk factors for CVA she is being admitted for TIA work-up.  Review of Systems: As mentioned in the history of present illness. All other systems reviewed and are negative. Past Medical History:  Diagnosis Date   CAD (coronary artery disease)    a. 2004 s/p MI w/ PCI/stenting to the LCX, OM1, OM2, RCA, RPDA; b. 05/2015 MI/PCI: 100 OM1 (2.75x20 & 3.0x8 Promus Premier DES'), PTCA OM2; c. 07/2015 Cath/Staged PCI: LM 60, LAD 75d, LCX 59m ISR, OM1 ok, OM2 25 ISR, RCA 50p, 44m/d, 85d (3.0x28 Promus Premier DES).   COVID 2 months ago   Diastolic dysfunction    a. 05/2015 Echo: EF 50-55%, severe lateral HK. Gr1 DD, mildly dil LA. Mildly reduced RV fxn.   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    "got booster shot a few times"   GERD (gastroesophageal reflux disease)    Hyperlipidemia    Hypertensive heart disease    Menopausal state    Morbid obesity (HCC)    Noncompliance    Sleep apnea    Past Surgical History:   Procedure Laterality Date   CARDIAC CATHETERIZATION N/A 06/08/2015   Procedure: Left Heart Cath and Coronary Angiography;  Surgeon: Lyn Records, MD;  [facility] INVASIVE CV LAB;  Service: Cardiovascular;  Laterality: N/A;   CARDIAC CATHETERIZATION N/A 06/08/2015   Procedure: Coronary Stent Intervention;  Surgeon: Lyn Records, MD;  Location[facility] INVASIVE CV LAB;  Service: Cardiovascular;  Laterality: N/A;   CARDIAC CATHETERIZATION N/A 07/16/2015   Procedure: Coronary Stent Intervention;  Surgeon: Lyn Records, MD;  [facility] INVAS[facility] CV LAB;  Service: Cardiovascular;  Laterality: N/A;   COLONOSCOPY N/A 02/17/2021   Procedure: COLONOSCOPY;  Surgeon: Midge Minium, MD;  Location: [facility] ENDOSCOPY;  Service: Endoscopy;  Laterality: N/A;   CORONARY ANGIOPLASTY WITH STENT PLACEMENT     "6 stents before 05/2015"   KNEE ARTHROSCOPY Left 1990's   TUBAL LIGATION  ~ 1978   Social History:  reports that she has never smoked. She has never used smokeless tobacco. She reports that she does not drink alcohol and does not use drugs.  Allergies  Allergen Reactions   Brilinta [Ticagrelor] Shortness Of Breath   Lipitor [Atorvastatin] Itching    Mouth itching, cough   Latex Other (See Comments)    Gloves make hands look black after prolonged use   Penicillins Itching and Swelling    Tongue itching and lip swelling Has patient had a PCN  reaction causing immediate rash, facial/tongue/throat swelling, SOB or lightheadedness with hypotension: yes Has patient had a PCN reaction causing severe rash involving mucus membranes or skin necrosis: No Has patient had a PCN reaction that required hospitalization No Has patient had a PCN reaction occurring within the last 10 years: No If all of the above answers are "NO", then may proceed with Cephalosporin use.   Tekturna [Aliskiren] Rash    Family History  Problem Relation Age of Onset   Healthy Mother    Prostate cancer Father    Kidney disease Father     Healthy Brother    Cancer Neg Hx    Diabetes Neg Hx    Heart disease Neg Hx     Prior to Admission medications   Medication Sig Start Date End Date Taking? Authorizing Provider  clopidogrel (PLAVIX) 75 MG tablet Take 1 tablet by mouth once daily 06/14/22   Cannady, Jolene T, NP  amLODipine (NORVASC) 10 MG tablet Take 1 tablet (10 mg total) by mouth daily. 06/07/22   Aura Dials T, NP  aspirin EC 81 MG tablet Take 81 mg by mouth daily.    [provider]  cholecalciferol (VITAMIN D3) 25 MCG (1000 UNIT) tablet Take 1,000 Units by mouth daily.    [provider]  diclofenac Sodium (VOLTAREN) 1 % GEL Apply 2 g topically 4 (four) times daily. Apply to right knee 04/28/21   McElwee, Lauren A, NP  famotidine (PEPCID) 20 MG tablet Take 1 tablet (20 mg total) by mouth 2 (two) times daily. 03/25/22   Cannady, Corrie Dandy T, NP  hydrochlorothiazide (HYDRODIURIL) 25 MG tablet Take 1 tablet (25 mg total) by mouth daily. 06/07/22   Cannady, Corrie Dandy T, NP  isosorbide mononitrate (IMDUR) 60 MG 24 hr tablet Take 1 tablet (60 mg total) by mouth daily. 06/07/22   Cannady, Corrie Dandy T, NP  lidocaine (LIDODERM) 5 % Place 1 patch onto the skin daily. Remove & Discard patch within 12 hours or as directed by MD 06/07/22   Aura Dials T, NP  losartan (COZAAR) 100 MG tablet Take 1 tablet (100 mg total) by mouth daily. 06/07/22   Cannady, Corrie Dandy T, NP  nitroGLYCERIN (NITROSTAT) 0.4 MG SL tablet DISSOLVE ONE TABLET UNDER THE TONGUE EVERY 5 MINUTES AS NEEDED FOR CHEST PAIN.  DO NOT EXCEED A TOTAL OF 3 DOSES IN 15 MINUTES 06/14/22   Cannady, Jolene T, NP  potassium chloride SA (KLOR-CON M) 20 MEQ tablet TAKE 1  BY MOUTH ONCE DAILY 06/14/22   Cannady, Jolene T, NP  rosuvastatin (CRESTOR) 40 MG tablet Take 1 tablet (40 mg total) by mouth daily. 06/07/22   Cannady, Corrie Dandy T, NP  spironolactone (ALDACTONE) 25 MG tablet Take 1 tablet (25 mg total) by mouth every other day. 06/07/22   Marjie Skiff, NP  UNABLE TO FIND  Neo-Cell    [provider]    Physical Exam: Vitals:   06/22/22 1545 06/22/22 1622 06/22/22 1630 06/22/22 1730  BP: (!) 185/96 (!) 151/90 (!) 145/86 127/89  Pulse: 90 74 76 70  Resp: 18 16 12 16   Temp:    98.1 F (36.7 C)  TempSrc:    Oral  SpO2: 98% 99% 98% 95%   Constitutional: Morbidly obese, NAD, calm, comfortable Eyes: PERRL, lids and conjunctivae normal ENMT: Mucous membranes are moist. Posterior pharynx clear of any exudate or lesions.Normal dentition.  Neck: normal, supple, no masses, no thyromegaly Respiratory: clear to auscultation bilaterally, no wheezing, no crackles. Normal respiratory effort.  No accessory muscle use.  Cardiovascular: Regular rate and rhythm, no murmurs / rubs / gallops. No extremity edema. 2+ pedal pulses. No carotid bruits.  Abdomen: no tenderness, no masses palpated. No hepatosplenomegaly. Bowel sounds positive.  Musculoskeletal: Good range of motion, no joint swelling or tenderness, Skin: no rashes, lesions, ulcers. No induration Neurologic: CN 2-12 grossly intact. Sensation intact, DTR normal. Strength 5/5 in all 4.  Psychiatric: Normal judgment and insight. Alert and oriented x 3. Normal mood   Data Reviewed:  Temperature 98.9 blood pressure 198/89, pulse 90 respiratory 20 oxygen sat 99% room air.  CBC and chemistry appear all to be within normal.  Urinalysis negative.  Urine drug screen is negative.  CT angiogram head and neck is normal chest x-ray negative head CT without contrast negative.   Assessment and Plan:  #1 TIA: Patient will be admitted.  MRI of the brain ordered.  Echocardiogram, aspirin and statin.  Neurology already consulted.  We will continue with supportive care.  Depending on the results we will treat further.  Continue PT OT.  #2 essential hypertension: Continue blood pressure control using home regimen and other medications.  #3 morbid obesity: Dietary counseling.  #4 coronary artery disease: Continues home  regimen.  #5 chronic diastolic heart failure: Compensated.   Advance Care Planning:   Code Status: Prior full code  Consults: Neurology  Family Communication: Daughter at bedside  Severity of Illness: The appropriate patient status for this patient is OBSERVATION. Observation status is judged to be reasonable and necessary in order to provide the required intensity of service to ensure the patient's safety. The patient's presenting symptoms, physical exam findings, and initial radiographic and laboratory data in the context of their medical condition is felt to place them at decreased risk for further clinical deterioration. Furthermore, it is anticipated that the patient will be medically stable for discharge from the hospital within 2 midnights of admission.   AuthorBarbette Merino, MD 06/22/2022 6:21 PM  For on call review [email or web address].

## 2022-06-22 NOTE — Code Documentation (Signed)
Stroke Response Nurse Documentation Code Documentation  Jennifer Grant is a 71 y.o. female arriving to [facility] via [employer] on 06/22/2022 with past medical hx of HLD, GERD, CAD, obesity, sleep apnea, diastolic dysfunction. On No antithrombotic. Code stroke was activated by ED.   Patient from home where she was LKW at 1400 and now complaining of right sided burning. Patient was bending over at 1400 when she started experiencing right sided burning from face and chest down to the leg. Code stroke called by EDP when patient reported experiencing weakness of right arm and leg with the burning sensation.   Stroke team at the bedside upon code stroke arrival. Patient cleared for CT by Dr. Jacqualine Code. Patient to CT with team. NIHSS 2, see documentation for details and code stroke times. Patient with disoriented and right decreased sensation on exam. The following imaging was completed:  CT Head. Patient is not a candidate for IV Thrombolytic due to too mild to treat, per MD. Patient is not a candidate for IR due to LVO not suspected, per MD.   Patient with symptoms completely resolved, back to baseline, with an NIH of 0, including no burning sensation that patient was previously complaining of. EDP Quale updated.     Care Plan: Q2 NIHSS + vital signs, RN and patient educated to re-activate code stroke if symptoms reoccur.   Bedside handoff with ED RN Tammy.    Charise Carwin  Stroke Response RN

## 2022-06-22 NOTE — ED Provider Notes (Signed)
[facility] Provider Note    Event Date/Time   First MD Initiated Contact with Patient 06/22/22 1525     (approximate)   History   Chest Pain   HPI  Jennifer Grant is a 71 y.o. female on review of primary care notes from October has a history of heart failure hypertension hyperlipidemia as well as paroxysmal A-fib, previous MI sleep apnea and an EF of 50 to 55% with diastolic heart failure   Regarding notation of paroxysmal A-fib from primary care note, not anticoagulated  At 2 PM today, patient was at home and she suddenly started to feel what she describes as a burning feeling over the right side of her face around her ear and then went down around her upper chest right arm and into her right leg over the course about a minute.  She reports the symptoms did not occur at all on her left side.  When this started she decided to take all of her blood pressure medicine which she had not yet taken today, she took those but did not take her aspirin.  Symptoms went on for about 20 to 30 minutes and have started to improve now.  At this point she continues to have a burning feeling in her right arm and her right side of her face but the feelings of weakness involving the right arm and right leg have improved, except a very slight feeling of weakness in just the right arm only now.  No headache.  No chest pain.    Physical Exam   Triage Vital Signs: ED Triage Vitals  Enc Vitals Group     BP 06/22/22 1513 (!) 198/89     Pulse Rate 06/22/22 1513 80     Resp 06/22/22 1513 18     Temp 06/22/22 1513 97.9 F (36.6 C)     Temp src --      SpO2 06/22/22 1513 98 %     Weight --      Height --      Head Circumference --      Peak Flow --      Pain Score 06/22/22 1511 4     Pain Loc --      Pain Edu? --      Excl. in GC? --     Most recent vital signs: Vitals:   06/22/22 1630 06/22/22 1730  BP: (!) 145/86 127/89  Pulse: 76 70  Resp: 12 16  Temp:  98.1 F  (36.7 C)  SpO2: 98% 95%     General: Awake, no distress.  CV:  Good peripheral perfusion.  Normal heart tones Resp:  Normal effort.  Clear lungs bilaterally Abd:  No distention.  Other:  NIHSS ZERO, patient does however note a burning paresthesia type symptoms but able to discriminate touch normally over the right side with normal strength nonetheless.   ED Results / Procedures / Treatments   Labs (all labs ordered are listed, but only abnormal results are displayed) Labs Reviewed  URINALYSIS, ROUTINE W REFLEX MICROSCOPIC - Abnormal; Notable for the following components:      Result Value   Color, Urine YELLOW (*)    APPearance HAZY (*)    Hgb urine dipstick SMALL (*)    Leukocytes,Ua SMALL (*)    Bacteria, UA RARE (*)    All other components within normal limits  CBC WITH DIFFERENTIAL/PLATELET - Abnormal; Notable for the following components:   RBC 5.40 (*)  MCH 25.4 (*)    All other components within normal limits  BASIC METABOLIC PANEL  ETHANOL  PROTIME-INR  APTT  URINE DRUG SCREEN, QUALITATIVE ([facility] ONLY)  CBG MONITORING, ED  TROPONIN I (HIGH SENSITIVITY)  TROPONIN I (HIGH SENSITIVITY)     EKG  And interpreted by me at 1520 heart rate 90 QRS 100 QTc 430 Normal sinus rhythm, 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  Occasional PVC .  Mild nonspecific T wave abnormality, no frank evidence of ischemic change to noted  RADIOLOGY  chest x-ray personally interpreted by me as negative for acute gross pathology   CT HEAD CODE STROKE WO CONTRAST  Result Date: 06/22/2022 CLINICAL DATA:  Code stroke. Transient ischemic attack. Right-sided paresthesia. Weakness on the left. EXAM: CT HEAD WITHOUT CONTRAST TECHNIQUE: Contiguous axial images were obtained from the base of the skull through the vertex without intravenous contrast. RADIATION DOSE REDUCTION: This exam was performed according to the departmental dose-optimization program which includes automated exposure control,  adjustment of the mA and/or kV according to patient size and/or use of iterative reconstruction technique. COMPARISON:  None FINDINGS: Brain: No sign of acute infarction, mass lesion, hemorrhage, hydrocephalus or extra-axial collection. Mild chronic small-vessel ischemic change of the cerebral hemispheric white matter. Vascular: No acute hyperdense vessel. Skull: Negative Sinuses/Orbits: Clear/normal Other: None ASPECTS ([place] Stroke Program Early CT Score) - Ganglionic level infarction (caudate, lentiform nuclei, internal capsule, insula, M1-M3 cortex): 7 - Supraganglionic infarction (M4-M6 cortex): 3 Total score (0-10 with 10 being normal): 10 IMPRESSION: 1. No acute CT finding. Mild chronic small-vessel ischemic change of the cerebral hemispheric white matter. 2. Aspects is 10. These results were communicated to Dr. Curly Shores at 4:21 pm on 06/22/2022 by text page via the [facility] messaging system. Electronically Signed   By: Nelson Chimes M.D.   On: 06/22/2022 16:23   DG Chest 2 View  Result Date: 06/22/2022 CLINICAL DATA:  Chest pain.  head, right arm and face burning. EXAM: CHEST - 2 VIEW COMPARISON:  Chest x-ray 04/16/2021, chest x-ray 10/09/2020 FINDINGS: The heart and mediastinal contours are unchanged. Aortic calcification. Suggestion of coronary artery stent. No focal consolidation. No pulmonary edema. No pleural effusion. No pneumothorax. No acute osseous abnormality. IMPRESSION: 1. No active cardiopulmonary disease. 2.  Aortic Atherosclerosis (ICD10-I70.0). Electronically Signed   By: Iven Finn M.D.   On: 06/22/2022 15:37       PROCEDURES:  Critical Care performed: Yes, see critical care procedure note(s)  CRITICAL CARE Performed by: Delman Kitten   Total critical care time: 30 minutes  Critical care time was exclusive of separately billable procedures and treating other patients.  Critical care was necessary to treat or prevent imminent or life-threatening deterioration.  Critical  care was time spent personally by me on the following activities: development of treatment plan with patient and/or surrogate as well as nursing, discussions with consultants, evaluation of patient's response to treatment, examination of patient, obtaining history from patient or surrogate, ordering and performing treatments and interventions, ordering and review of laboratory studies, ordering and review of radiographic studies, pulse oximetry and re-evaluation of patient's condition.  Procedures   MEDICATIONS ORDERED IN ED: Medications  aspirin chewable tablet 324 mg (324 mg Oral Given 06/22/22 1710)     IMPRESSION / MDM / ASSESSMENT AND PLAN / ED COURSE  I reviewed the triage vital signs and the nursing notes.  Differential diagnosis includes, but is not limited to, possible stroke, TIA, peripheral paresthesia, electrolyte abnormality, ACS though this seems less likely for other etiologies strongly considered.  Differential is broad but based on initial presentation code stroke initiated  ----------------------------------------- 3:53 PM on 06/22/2022 ----------------------------------------- After examining the patient, and a thorough ER assessment he made the decision to activate a code stroke.  She still has feeling of a burning paresthesia over the right arm and right side of the face and subjectively feels a slight feeling of weakness in her right arm but reports it is much better and her symptoms have improved including no weakness in her leg and the burning feeling or paresthesia in her leg has improved markedly from when it was when it started about 2 PM today.  Differential diagnosis remains broad, but acute stroke or hemorrhage does remain on the differential and I wish to exclude that given we are in the acute stroke window.  I have consulted neurology for this via the acute stroke process  Patient's presentation is most consistent with acute  presentation with potential threat to life or bodily function.  The patient is on the cardiac monitor to evaluate for evidence of arrhythmia and/or significant heart rate changes.    ----------------------------------------- 4:52 PM on 06/22/2022 ----------------------------------------- Neurology, Dr. Curly Shores, has seen the patient and is advising at this time symptoms seem most concerning for TIA.  We will proceed with oral aspirin, and anticipate admission but currently pending labs and further work-up including cardiac work-up given her previous history, but today symptoms seem to be primarily right-sided with a paresthesia or burning component.  All neurologic symptoms including the burning, paresthesia, and weakness are now resolved  ----------------------------------------- 6:15 PM on 06/22/2022 ----------------------------------------- Consulted with hospitalist.  Patient will be admitted to the service of Dr. Jonelle Sidle.  Currently asymptomatic  Labs interpreted as grossly normal CBC and metabolic panel with initial troponin normal.  Urinalysis small amount of blood rare bacteria but no clear evidence to suggest UTI.  FINAL CLINICAL IMPRESSION(S) / ED DIAGNOSES   Final diagnoses:  Paresthesia  TIA (transient ischemic attack)     Rx / DC Orders   ED Discharge Orders     None        Note:  This document was prepared using Dragon voice recognition software and may include unintentional dictation errors.   Delman Kitten, MD 06/22/22 1816

## 2022-06-22 NOTE — ED Notes (Signed)
Code  stroke  called  to  Maudie Mercury  at  [employer]

## 2022-06-22 NOTE — Consult Note (Signed)
CODE STROKE- PHARMACY COMMUNICATION   Time CODE STROKE called/page received:[id number]  Time response to CODE STROKE was made (in person or via phone): in person  Time Stroke Kit retrieved from [place] (only if needed):n/a  Name of Provider/Nurse contacted:Dr Bhagaht  Past Medical History:  Diagnosis Date   CAD (coronary artery disease)    a. 2004 s/p MI w/ PCI/stenting to the LCX, OM1, OM2, RCA, RPDA; b. 05/2015 MI/PCI: 100 OM1 (2.75x20 & 3.0x8 Promus Premier DES'), PTCA OM2; c. 07/2015 Cath/Staged PCI: LM 78, LAD 75d, LCX 68mISR, OM1 ok, OM2 25 ISR, RCA 50p, 931m, 85d (3.0x28 Promus Premier DES).   COVID 2 months ago   Diastolic dysfunction    a. 05/2015 Echo: EF 50-55%, severe lateral HK. Gr1 DD, mildly dil LA. Mildly reduced RV fxn.   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    "got booster shot a few times"   GERD (gastroesophageal reflux disease)    Hyperlipidemia    Hypertensive heart disease    Menopausal state    Morbid obesity ([place]   Noncompliance    Sleep apnea    Prior to Admission medications   Medication Sig Start Date End Date Taking? Authorizing Provider  clopidogrel (PLAVIX) 75 MG tablet Take 1 tablet by mouth once daily 06/14/22   Cannady, Jolene T, NP  amLODipine (NORVASC) 10 MG tablet Take 1 tablet (10 mg total) by mouth daily. 06/07/22   CaMarnee Guarneri, NP  aspirin EC 81 MG tablet Take 81 mg by mouth daily.    [provider]  cholecalciferol (VITAMIN D3) 25 MCG (1000 UNIT) tablet Take 1,000 Units by mouth daily.    [provider]  diclofenac Sodium (VOLTAREN) 1 % GEL Apply 2 g topically 4 (four) times daily. Apply to right knee 04/28/21   McElwee, Lauren A, NP  famotidine (PEPCID) 20 MG tablet Take 1 tablet (20 mg total) by mouth 2 (two) times daily. 03/25/22   Cannady, JoHenrine Screws, NP  hydrochlorothiazide (HYDRODIURIL) 25 MG tablet Take 1 tablet (25 mg total) by mouth daily. 06/07/22   Cannady, JoHenrine Screws, NP  isosorbide mononitrate (IMDUR) 60 MG 24 hr tablet Take 1 tablet  (60 mg total) by mouth daily. 06/07/22   Cannady, JoHenrine Screws, NP  lidocaine (LIDODERM) 5 % Place 1 patch onto the skin daily. Remove & Discard patch within 12 hours or as directed by MD 06/07/22   CaMarnee Guarneri, NP  losartan (COZAAR) 100 MG tablet Take 1 tablet (100 mg total) by mouth daily. 06/07/22   Cannady, JoHenrine Screws, NP  nitroGLYCERIN (NITROSTAT) 0.4 MG SL tablet DISSOLVE ONE TABLET UNDER THE TONGUE EVERY 5 MINUTES AS NEEDED FOR CHEST PAIN.  DO NOT EXCEED A TOTAL OF 3 DOSES IN 15 MINUTES 06/14/22   Cannady, Jolene T, NP  potassium chloride SA (KLOR-CON M) 20 MEQ tablet TAKE 1  BY MOUTH ONCE DAILY 06/14/22   Cannady, Jolene T, NP  rosuvastatin (CRESTOR) 40 MG tablet Take 1 tablet (40 mg total) by mouth daily. 06/07/22   Cannady, JoHenrine Screws, NP  spironolactone (ALDACTONE) 25 MG tablet Take 1 tablet (25 mg total) by mouth every other day. 06/07/22   CaVenita LickNP  UNABLE TO FIND Neo-Cell    [provider]    Elayjah Chaney Rodriguez-Guzman PharmD, BCPS 06/22/2022 5:03 PM

## 2022-06-22 NOTE — Consult Note (Signed)
Neurology Consultation Reason for Consult: Code stroke Requesting Physician: Sharyn Creamer   CC: Burning pain of the left side   History is obtained from: Patient and chart review  HPI: Jennifer Grant is a 71 y.o. female with a past medical history significant for coronary artery disease s/p multiple stents, hyperlipidemia, hypertension, obesity, sleep apnea, nonadherence to medications  She was in her usual state of health today, had bent down to pick something up when she began to have burning pain in the right side of her head that progressed down the right side of her body into her right leg and perhaps made her feel slightly weak on the right side as well.  She became concerned and decided to take her daily medications and then presented to the ED for further evaluation.  Symptoms were rapidly resolving during my evaluation.  She notes that she has never had this burning sensation before except when she had her heart attack and denies any other recent transient neurological symptoms or other recent concerns on full review of systems.  Daughter at bedside notes that her mother does not like to take medications which patient agrees is an accurate statement.  LKW: 2 PM Thrombolytic given?: No, too mild to treat  Checklist of contraindications was reviewed and negative. Risks, benefits and alternatives were discussed, and patient would be considered for intervention if her symptoms worsen within the treatment window IA performed?: No, exam not consistent with LVO Premorbid modified rankin scale:      0 - No symptoms.    ROS: All other review of systems was negative except as noted in the HPI.   Past Medical History:  Diagnosis Date   CAD (coronary artery disease)    a. 2004 s/p MI w/ PCI/stenting to the LCX, OM1, OM2, RCA, RPDA; b. 05/2015 MI/PCI: 100 OM1 (2.75x20 & 3.0x8 Promus Premier DES'), PTCA OM2; c. 07/2015 Cath/Staged PCI: LM 60, LAD 75d, LCX 42m ISR, OM1 ok, OM2 25 ISR, RCA 50p,  41m/d, 85d (3.0x28 Promus Premier DES).   COVID 2 months ago   Diastolic dysfunction    a. 05/2015 Echo: EF 50-55%, severe lateral HK. Gr1 DD, mildly dil LA. Mildly reduced RV fxn.   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    "got booster shot a few times"   GERD (gastroesophageal reflux disease)    Hyperlipidemia    Hypertensive heart disease    Menopausal state    Morbid obesity (HCC)    Noncompliance    Sleep apnea    Past Surgical History:  Procedure Laterality Date   CARDIAC CATHETERIZATION N/A 06/08/2015   Procedure: Left Heart Cath and Coronary Angiography;  Surgeon: Lyn Records, MD;  Location: [facility] INVASIVE CV LAB;  Service: Cardiovascular;  Laterality: N/A;   CARDIAC CATHETERIZATION N/A 06/08/2015   Procedure: Coronary Stent Intervention;  Surgeon: Lyn Records, MD;  Location: [facility] INVASIVE CV LAB;  Service: Cardiovascular;  Laterality: N/A;   CARDIAC CATHETERIZATION N/A 07/16/2015   Procedure: Coronary Stent Intervention;  Surgeon: Lyn Records, MD;  Location: [facility] INVASIVE CV LAB;  Service: Cardiovascular;  Laterality: N/A;   COLONOSCOPY N/A 02/17/2021   Procedure: COLONOSCOPY;  Surgeon: Midge Minium, MD;  Location: [facility] ENDOSCOPY;  Service: Endoscopy;  Laterality: N/A;   CORONARY ANGIOPLASTY WITH STENT PLACEMENT     "6 stents before 05/2015"   KNEE ARTHROSCOPY Left 1990's   TUBAL LIGATION  ~ 1978   Current Outpatient Medications  Medication Instructions   amLODipine (NORVASC) 10 mg, Oral, Daily  aspirin EC 81 mg, Oral, Daily   cholecalciferol (VITAMIN D3) 1,000 Units, Oral, Daily   clopidogrel (PLAVIX) 75 MG tablet Take 1 tablet by mouth once daily   diclofenac Sodium (VOLTAREN) 2 g, Topical, 4 times daily, Apply to right knee   famotidine (PEPCID) 20 mg, Oral, 2 times daily   hydrochlorothiazide (HYDRODIURIL) 25 mg, Oral, Daily   isosorbide mononitrate (IMDUR) 60 mg, Oral, Daily   lidocaine (LIDODERM) 5 % 1 patch, Transdermal, Every 24 hours, Remove & Discard patch within 12 hours or as  directed by MD   losartan (COZAAR) 100 mg, Oral, Daily   nitroGLYCERIN (NITROSTAT) 0.4 MG SL tablet DISSOLVE ONE TABLET UNDER THE TONGUE EVERY 5 MINUTES AS NEEDED FOR CHEST PAIN.  DO NOT EXCEED A TOTAL OF 3 DOSES IN 15 MINUTES   potassium chloride SA (KLOR-CON M) 20 MEQ tablet TAKE 1  BY MOUTH ONCE DAILY   rosuvastatin (CRESTOR) 40 mg, Oral, Daily   spironolactone (ALDACTONE) 25 mg, Oral, Every other day   UNABLE TO FIND Neo-Cell      Family History  Problem Relation Age of Onset   Healthy Mother    Prostate cancer Father    Kidney disease Father    Healthy Brother    Cancer Neg Hx    Diabetes Neg Hx    Heart disease Neg Hx      Social History:  reports that she has never smoked. She has never used smokeless tobacco. She reports that she does not drink alcohol and does not use drugs.   Exam: Current vital signs: BP (!) 198/89   Pulse 80   Temp 97.9 F (36.6 C)   Resp 18   LMP  (LMP Unknown)   SpO2 98%  Vital signs in last 24 hours: Temp:  [97.9 F (36.6 C)] 97.9 F (36.6 C) (10/24 1513) Pulse Rate:  [80] 80 (10/24 1513) Resp:  [18] 18 (10/24 1513) BP: (198)/(89) 198/89 (10/24 1513) SpO2:  [98 %] 98 % (10/24 1513)   Physical Exam  Constitutional: Appears well-developed and well-nourished.  Psych: Affect appropriate to situation, mildly anxious but cooperative and pleasant Eyes: No scleral injection HENT: No oropharyngeal obstruction.  MSK: no joint deformities.  Cardiovascular: Perfusing extremities well Respiratory: Effort normal, non-labored breathing GI: Soft.  No distension. There is no tenderness.  Skin: Warm dry and intact visible skin  Neuro: Mental Status: Patient is awake, alert, oriented to person, place, month, year, and situation, other than reporting her age is 20 which daughter confirms is standard in [place] culture Patient is able to give a clear and coherent history. No signs of aphasia or neglect, within limits of slight difficulty  understanding each other's accents Cranial Nerves: II: Visual Fields are full. Pupils are equal, round, and reactive to light.   III,IV, VI: EOMI without ptosis or diploplia.  V: Facial sensation is symmetric to temperature and light touch.  Burning paresthesia that she presented with is rapidly resolving (present only in the face and then fully resolved) VII: Facial movement is symmetric.  VIII: hearing is intact to voice X: Uvula elevates symmetrically XI: Shoulder shrug is symmetric. XII: tongue is midline without atrophy or fasciculations.  Motor: Tone is normal. Bulk is normal. 5/5 strength was present in all four extremities.  Normal fine finger tapping bilaterally.  Negative orbit.  No pronator drift Sensory: Sensation is symmetric to light touch and temperature in the arms and legs. Deep Tendon Reflexes: 2+ and symmetric in the biceps, difficult elicit  patellar reflexes Cerebellar: FNF and HKS are intact bilaterally Gait:  Able to stand on heels and toes.  Bears weight equally on both legs.  Normal casual gait when ambulating from scanner to stretcher  NIHSS total 2 Score breakdown: One-point for not correctly reporting age (baseline/cultural), one-point for initial sensory changes which resolved Performed at 3:57 PM    I have reviewed labs in epic and the results pertinent to this consultation are:  Basic Metabolic Panel: Recent Labs  Lab 06/22/22 1544  NA 135  K 3.7  CL 99  CO2 25  GLUCOSE 89  BUN 13  CREATININE 0.74  CALCIUM 10.1    CBC: Recent Labs  Lab 06/22/22 1544  WBC 5.6  NEUTROABS 3.4  HGB 13.7  HCT 43.9  MCV 81.3  PLT 224    Coagulation Studies: Recent Labs    06/22/22 1544  LABPROT 14.1  INR 1.1     Latest Reference Range & Units Most Recent  Troponin I (High Sensitivity) <18 ng/L 12 06/22/22 15:44   Lab Results  Component Value Date   HGBA1C 6.0 (H) 06/07/2022   Lab Results  Component Value Date   CHOL 191 06/07/2022   HDL  39 (L) 06/07/2022   LDLCALC 126 (H) 06/07/2022   TRIG 146 06/07/2022   CHOLHDL 4.5 (H) 08/09/2018    02/17/2022 ECHO  1. Left ventricular ejection fraction, by estimation, is 50 to 55%. The  left ventricle has low normal function. The left ventricle demonstrates  regional wall motion abnormalities (see scoring diagram/findings for  description). There is mild concentric  left ventricular hypertrophy. Left ventricular diastolic parameters are  indeterminate. There is hypokinesis of the left ventricular, mid-apical  anterolateral wall and apical segment.   2. Right ventricular systolic function is normal. The right ventricular  size is normal.   3. Left atrial size was mildly dilated.   4. The mitral valve is grossly normal. Trivial mitral valve  regurgitation. No evidence of mitral stenosis. Moderate mitral annular  calcification.   5. The aortic valve is grossly normal. There is moderate calcification of  the aortic valve. There is moderate thickening of the aortic valve. Aortic  valve regurgitation is trivial. Aortic valve sclerosis/calcification is  present, without any evidence of   aortic stenosis.   6. Aortic dilatation noted. There is mild dilatation of the ascending  aorta, measuring 42 mm.   7. The inferior vena cava is normal in size with greater than 50%  respiratory variability, suggesting right atrial pressure of 3 mmHg.   I have reviewed the images obtained:  Head CT personally reviewed, agree with radiology no acute intracranial process   Impression: Overall patient's symptoms are fairly atypical given burning paresthesias described as a jacksonian march but then also associated with some weakness with rapid resolution.  Differential diagnosis includes recrudescence/thalamic dysfunction in the setting of hypertensive emergency (resolving, initial blood pressure 198/89), possibly TIA/stroke, possibly focal seizure given that this is the second stereotyped event she has  experienced  Initial recommendations: - Too mild to treat, symptoms resolved after head CT - Q30 min checks while in the window (6:30 PM), reactivate for new or recurrent symptoms - Cardiac workup per EDP - MRI brain to be ordered after 6:30 PM (now ordered)   Additional recommendations: - Continue dual antiplatelet therapy - No need to repeat lipid panel or A1c given recent results above - If MRI brain is negative may hold off on repeat echocardiogram given recent echocardiogram, but given  left atrial enlargement if no arrhythmia is captured here would discharge with an event monitor; if MRI is positive for an embolic appearing stroke would want to repeat ECHO - CTA head and neck  - Continue counseling on the importance of medication adherence, diet, exercise, weight loss - Neurology will follow   Brooke Dare MD-PhD [facility] [phone] [facility] coverage for [facility] is from 8 AM to 4 AM in-house and 4 PM to 8 PM by telephone/video. 8 PM to 8 AM emergent questions or overnight urgent questions should be addressed to Teleneurology On-call or [facility] neurohospitalist; contact information can be found on AMION  CRITICAL CARE Performed by: Gordy Councilman   Total critical care time: 40 minutes  Critical care time was exclusive of separately billable procedures and treating other patients.  Critical care was necessary to treat or prevent imminent or life-threatening deterioration, evaluation for acute neurological changes with potential to treat with thrombolytic or intra-arterial thrombectomy  Critical care was time spent personally by me on the following activities: development of treatment plan with patient and/or surrogate as well as nursing, discussions with consultants, evaluation of patient's response to treatment, examination of patient, obtaining history from patient or surrogate, ordering and performing treatments and interventions, ordering and  review of laboratory studies, ordering and review of radiographic studies, pulse oximetry and re-evaluation of patient's condition.

## 2022-06-22 NOTE — ED Notes (Signed)
Pt going to CT a this time

## 2022-06-22 NOTE — ED Notes (Signed)
Neurologist at bedside. 

## 2022-06-22 NOTE — ED Triage Notes (Addendum)
Pt comes with c/o  head, right arm and face burning. Pt states she is a heart pt. Pt states in past she hasn't had CP when she has had issue with heart.   Pt states it is like numbness that is going on.

## 2022-06-22 NOTE — Progress Notes (Signed)
Stroke cart activated at 1558. Dr Maurine Minister already at bedside assessing pt. Pt to CT at 1606 and back at 1618. Dr Maurine Minister stated No TNK at 1622.  Linden Dolin, Tele-Stroke RN

## 2022-06-23 ENCOUNTER — Inpatient Hospital Stay
Admit: 2022-06-23 | Discharge: 2022-06-23 | Disposition: A | Discharge status: Home / Self Care | Payer: Medicare Other | Attending: Physician Assistant | Admitting: Physician Assistant

## 2022-06-23 DIAGNOSIS — G459 Transient cerebral ischemic attack, unspecified: Secondary | ICD-10-CM | POA: Diagnosis not present

## 2022-06-23 DIAGNOSIS — R2990 Unspecified symptoms and signs involving the nervous system: Secondary | ICD-10-CM | POA: Diagnosis not present

## 2022-06-23 DIAGNOSIS — I471 Supraventricular tachycardia, unspecified: Secondary | ICD-10-CM

## 2022-06-23 LAB — LIPID PANEL
Cholesterol: 105 mg/dL (ref 0–200)
HDL: 28 mg/dL — ABNORMAL LOW (ref >40)
LDL Cholesterol: 64 mg/dL (ref 0–99)
Total CHOL/HDL Ratio: 3.8 RATIO
Triglycerides: 65 mg/dL (ref <150)
VLDL: 13 mg/dL (ref 0–40)

## 2022-06-23 LAB — ECHOCARDIOGRAM COMPLETE
AR max vel: 1.54 cm2
AV Area VTI: 1.79 cm2
AV Area mean vel: 1.43 cm2
AV Mean grad: 7.6 mmHg
AV Peak grad: 15.8 mmHg
Ao pk vel: 1.99 m/s
Area-P 1/2: 3.08 cm2
S' Lateral: 3.1 cm

## 2022-06-23 NOTE — Progress Notes (Signed)
Md notified of patients c/o feeling like face is swelling and right flank and anterior RUQ pain.

## 2022-06-23 NOTE — Procedures (Signed)
Patient Name: Jennifer Grant  MRN: [id number]  Epilepsy Attending: Lora Havens  Referring Physician/Provider: Lorenza Chick, MD  Date: 06/23/2022 Duration: 45.35 mins  Patient history: 71 year old female with burning pain on left side.  EEG to evaluate for seizure.  Level of alertness: Awake, asleep  AEDs during EEG study: None  Technical aspects: This EEG study was done with scalp electrodes positioned according to the 10-20 International system of electrode placement. Electrical activity was reviewed with band pass filter of 1-70Hz , sensitivity of 7 uV/mm, display speed of 64mm/sec with a 60Hz  notched filter applied as appropriate. EEG data were recorded continuously and digitally stored.  Video monitoring was available and reviewed as appropriate.  Description: The posterior dominant rhythm consists of 9 Hz activity of moderate voltage (25-35 uV) seen predominantly in posterior head regions, symmetric and reactive to eye opening and eye closing. Sleep was characterized by vertex waves, sleep spindles (12 to 14 Hz), maximal frontocentral region. Physiologic photic driving was not seen during photic stimulation.  Hyperventilation was not performed.     IMPRESSION: This study is within normal limits. No seizures or epileptiform discharges were seen throughout the recording.  A normal interictal EEG does not exclude the diagnosis of epilepsy.  Sakara Lehtinen Barbra Sarks

## 2022-06-23 NOTE — Progress Notes (Signed)
Order for Zio patch place. RT aware to place today. Please hold patient discharge until Zio patch placed. RN and hospitalist aware.

## 2022-06-23 NOTE — Progress Notes (Signed)
Discharged to home with all belongings. AVS given verbalized understanding of all contents

## 2022-06-23 NOTE — TOC Transition Note (Signed)
Transition of Care [facility]) - CM/SW Discharge Note   Patient Details  Name: Jennifer Grant MRN: [id number] Date of Birth: 04-02-1951  Transition of Care [facility]) CM/SW Contact:  Colen Darling, [place] Phone Number: 06/23/2022, 10:40 AM   Clinical Narrative:     SW provided the patient with [facility] resources including information on food and financial assistance.  Final next level of care: Home/Self Care     Patient Goals and CMS Choice      Home  Discharge Placement               Home        Discharge Plan and Services                                     Social Determinants of Health (SDOH) Interventions   Provided resource lists  Readmission Risk Interventions     No data to display

## 2022-06-23 NOTE — Progress Notes (Signed)
To EEG via bed.

## 2022-06-23 NOTE — Progress Notes (Signed)
Neurology Progress Note  Patient ID: Jennifer Grant is a 71 y.o. with PMHx of  has a past medical history of CAD (coronary artery disease), COVID (2 months ago), Diastolic dysfunction, 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, GERD (gastroesophageal reflux disease), Hyperlipidemia, Hypertensive heart disease, Menopausal state, Morbid obesity ([place]), Noncompliance, and Sleep apnea.  Subjective: Had a recurrent episode of burning pain just in the face today associated with feeling that her face was swelling.  She reports this is the third such episode and that perhaps it improves by drinking water   Exam: Vitals:   06/23/22 0011 06/23/22 0331  BP: 109/70 131/87  Pulse: 67 60  Resp: 18 16  Temp: 97.7 F (36.5 C) 97.7 F (36.5 C)  SpO2: 98% 95%   Gen: In bed, comfortable  Resp: non-labored breathing, no grossly audible wheezing Cardiac: Perfusing extremities well  Abd: soft, nt  Neuro: MS: Awake, alert, fluent speech, follows simple commands, oriented to person, place, age, situation CN: EOMI, face symmetric, tongue midline Motor: Using all 4 extremities spontaneously and equally.  No pronator drift  Sensory: Intact to light touch throughout face and extremities  Pertinent Data:  CTA personally reviewed, agree with radiology that there is no clinically significant stenosis, on my review there is significant tortuosity of her blood vessels consistent with chronically uncontrolled hypertension  MRI brain personally reviewed, agree with radiology that there is no acute intracranial process, but she does have significant chronic white matter changes consistent with her uncontrolled small vessel risk factors  EEG: Normal  Impression: Unclear etiology of the burning pain symptoms that she has.  MRI cervical spine previously did demonstrate some right-sided neuroforaminal stenosis which could contribute to symptoms in the face but would not explain symptoms into her arm and leg.  EEG is negative but given the  [place] march she describes she continues to have recurrent symptoms she could consider a trial of antiseizure medications (she continues to wish to minimize medications)  Recommendations: -Medication adherence, extended conversation had with patient and family at bedside -Initially recommended Zio patch but this has been recently completed, and I reviewed the report which was negative for atrial fibrillation -Patient prefers outpatient follow-up with [facility] clinic neurology, should be given number to call for appointment [phone]   Lesleigh Noe MD-PhD [facility] [phone]   Greater than 35 minutes were spent in care of this patient today the majority of which was at bedside in discussion of the results as above

## 2022-06-23 NOTE — Progress Notes (Signed)
Back from EEG , test performed yesterday. Patient denies c/o. Daughters at bedside.

## 2022-06-23 NOTE — Progress Notes (Signed)
Eeg done 

## 2022-06-23 NOTE — Discharge Summary (Signed)
Physician Discharge Summary   Jennifer Grant  female DOB: 19-Oct-1950  ZOX:[id number]  PCP: Marjie Skiff, NP  Admit date: 06/22/2022 Discharge date: 06/23/2022  Admitted From: home Disposition:  home CODE STATUS: Full code   Hospital Course:  For full details, please see H&P, progress notes, consult notes and ancillary notes.  Briefly,  Jennifer Grant is a 71 y.o. female with medical history significant of coronary artery disease, morbid obesity, diastolic dysfunction, hyperlipidemia, obstructive sleep apnea, paroxysmal atrial fibrillation, who presented to the ER today with sudden onset of burning feeling over the right side of her face around the ER and then went down her upper chest, right arm and into her right leg.  #1 right-sided burning pain symptoms --stroke workup neg.  Pt has had long-term cardiac monitor back in Jun 2023 that was neg for Afib.  Neuro consulted, and noted symptoms described suggested jacksonian march.  Spot EEG neg.   --if pt continues to have recurrent symptoms she could consider a trial of antiseizure medications (she continues to wish to minimize medications) --Patient prefers outpatient follow-up with [facility] clinic neurology --cont home ASA 81 and statin   #2 essential hypertension:  Continue blood pressure control using home regimen as below in med list.   #3 morbid obesity, BMI 39.79   #4 coronary artery disease:  --cont home ASA 81 and statin   #5 chronic diastolic heart failure:  Compensated.  Home potassium supplement d/c'ed since pt is supposed to be on losartan and aldactone (though may not be compliant before)   Discharge Diagnoses:  Principal Problem:   TIA (transient ischemic attack) Active Problems:   Essential hypertension   Hyperlipidemia   Chronic diastolic heart failure (HCC)   BMI 40.0-44.9, adult (HCC)   Aortic atherosclerosis (HCC)   GERD without esophagitis     Discharge Instructions:  Allergies as  of 06/23/2022       Reactions   Brilinta [ticagrelor] Shortness Of Breath   Lipitor [atorvastatin] Itching   Mouth itching, cough   Latex Other (See Comments)   Gloves make hands look black after prolonged use   Penicillins Itching, Swelling   Tongue itching and lip swelling Has patient had a PCN reaction causing immediate rash, facial/tongue/throat swelling, SOB or lightheadedness with hypotension: yes Has patient had a PCN reaction causing severe rash involving mucus membranes or skin necrosis: No Has patient had a PCN reaction that required hospitalization No Has patient had a PCN reaction occurring within the last 10 years: No If all of the above answers are "NO", then may proceed with Cephalosporin use.   Tekturna [aliskiren] Rash        Medication List     STOP taking these medications    potassium chloride SA 20 MEQ tablet Commonly known as: KLOR-CON M       TAKE these medications    amLODipine 10 MG tablet Commonly known as: NORVASC Take 1 tablet (10 mg total) by mouth daily.   aspirin EC 81 MG tablet Take 81 mg by mouth daily.   cholecalciferol 25 MCG (1000 UNIT) tablet Commonly known as: VITAMIN D3 Take 1,000 Units by mouth daily.   clopidogrel 75 MG tablet Commonly known as: PLAVIX Take 1 tablet by mouth once daily   diclofenac Sodium 1 % Gel Commonly known as: Voltaren Apply 2 g topically 4 (four) times daily. Apply to right knee   famotidine 20 MG tablet Commonly known as: Pepcid Take 1 tablet (20 mg total)  by mouth 2 (two) times daily.   hydrochlorothiazide 25 MG tablet Commonly known as: HYDRODIURIL Take 1 tablet (25 mg total) by mouth daily.   isosorbide mononitrate 60 MG 24 hr tablet Commonly known as: IMDUR Take 1 tablet (60 mg total) by mouth daily.   lidocaine 5 % Commonly known as: Lidoderm Place 1 patch onto the skin daily. Remove & Discard patch within 12 hours or as directed by MD   losartan 100 MG tablet Commonly known as:  COZAAR Take 1 tablet (100 mg total) by mouth daily.   nitroGLYCERIN 0.4 MG SL tablet Commonly known as: NITROSTAT DISSOLVE ONE TABLET UNDER THE TONGUE EVERY 5 MINUTES AS NEEDED FOR CHEST PAIN.  DO NOT EXCEED A TOTAL OF 3 DOSES IN 15 MINUTES   rosuvastatin 40 MG tablet Commonly known as: CRESTOR Take 1 tablet (40 mg total) by mouth daily.   spironolactone 25 MG tablet Commonly known as: ALDACTONE Take 1 tablet (25 mg total) by mouth every other day.   UNABLE TO FIND Neo-Cell         Follow-up Information     Marjie Skiff, NP. Go on 06/28/2022.   Specialty: Nurse Practitioner Why: @ 2pm Contact information: [address] [place] [address] [phone]                 Allergies  Allergen Reactions   Brilinta [Ticagrelor] Shortness Of Breath   Lipitor [Atorvastatin] Itching    Mouth itching, cough   Latex Other (See Comments)    Gloves make hands look black after prolonged use   Penicillins Itching and Swelling    Tongue itching and lip swelling Has patient had a PCN reaction causing immediate rash, facial/tongue/throat swelling, SOB or lightheadedness with hypotension: yes Has patient had a PCN reaction causing severe rash involving mucus membranes or skin necrosis: No Has patient had a PCN reaction that required hospitalization No Has patient had a PCN reaction occurring within the last 10 years: No If all of the above answers are "NO", then may proceed with Cephalosporin use.   Tekturna [Aliskiren] Rash     The results of significant diagnostics from this hospitalization (including imaging, microbiology, ancillary and laboratory) are listed below for reference.   Consultations:   Procedures/Studies: EEG adult  Result Date: 07-07-22 Charlsie Quest, MD     07/07/22  2:10 PM Patient Name: Jennifer Grant MRN: [id number] Epilepsy Attending: Charlsie Quest Referring Physician/Provider: Gordy Councilman, MD Date: 2022-07-07 Duration: 45.35  mins Patient history: 71 year old female with burning pain on left side.  EEG to evaluate for seizure. Level of alertness: Awake, asleep AEDs during EEG study: None Technical aspects: This EEG study was done with scalp electrodes positioned according to the 10-20 International system of electrode placement. Electrical activity was reviewed with band pass filter of 1-70Hz , sensitivity of 7 uV/mm, display speed of 99mm/sec with a  notched filter applied as appropriate. EEG data were recorded continuously and digitally stored.  Video monitoring was available and reviewed as appropriate. Description: The posterior dominant rhythm consists of 9 Hz activity of moderate voltage (25-35 uV) seen predominantly in posterior head regions, symmetric and reactive to eye opening and eye closing. Sleep was characterized by vertex waves, sleep spindles (12 to 14 Hz), maximal frontocentral region. Physiologic photic driving was not seen during photic stimulation.  Hyperventilation was not performed.   IMPRESSION: This study is within normal limits. No seizures or epileptiform discharges were seen throughout the recording. A  normal interictal EEG does not exclude the diagnosis of epilepsy. Charlsie Questriyanka O Yadav   ECHOCARDIOGRAM COMPLETE  Result Date: 06/23/2022    ECHOCARDIOGRAM REPORT   Patient Name:   Jennifer Grant Date of Exam: 06/22/2022 Medical Rec #:  [id number]        Height:       63.0 in Accession #:    [id number]       Weight:       224.6 lb Date of Birth:  10/12/1950         BSA:          2.032 m Patient Age:    71 years         BP:           153/85 mmHg Patient Gender: F                HR:           74 bpm. Exam Location:  [facility] Procedure: 2D Echo, Cardiac Doppler, Color Doppler and Intracardiac            Opacification Agent Indications:     G45.9 TIA  History:         Patient has prior history of Echocardiogram examinations, most                  recent 02/17/2022. COPD; Risk Factors:Dyslipidemia and Sleep                   Apnea. Hypertensive heart disease. COVID-19.  Sonographer:     Sedonia SmallNaTashia Rodgers-Jones RDCS Referring Phys:  [id number] Rometta EmeryMOHAMMAD L GARBA Diagnosing Phys: Yvonne Kendallhristopher End MD  Sonographer Comments: Technically difficult study due to poor echo windows. IMPRESSIONS  1. Left ventricular ejection fraction, by estimation, is >55%. The left ventricle has normal function. Left ventricular endocardial border not optimally defined to evaluate regional wall motion. There is mild left ventricular hypertrophy. Left ventricular diastolic parameters are consistent with Grade I diastolic dysfunction (impaired relaxation).  2. Right ventricular systolic function is normal. The right ventricular size is normal.  3. The mitral valve was not well visualized. No evidence of mitral valve regurgitation. No evidence of mitral stenosis. Moderate mitral annular calcification.  4. The aortic valve was not well visualized. Aortic valve regurgitation is trivial.  5. There is mild dilatation of the ascending aorta, measuring 42 mm.  6. The inferior vena cava is normal in size with greater than 50% respiratory variability, suggesting right atrial pressure of 3 mmHg. FINDINGS  Left Ventricle: Left ventricular ejection fraction, by estimation, is >55%. The left ventricle has normal function. Left ventricular endocardial border not optimally defined to evaluate regional wall motion. Definity contrast agent was given IV to delineate the left ventricular endocardial borders. The left ventricular internal cavity size was normal in size. There is mild left ventricular hypertrophy. Left ventricular diastolic parameters are consistent with Grade I diastolic dysfunction (impaired relaxation). Right Ventricle: The right ventricular size is normal. No increase in right ventricular wall thickness. Right ventricular systolic function is normal. Left Atrium: Left atrial size was normal in size. Right Atrium: Right atrial size was not well visualized. Pericardium:  There is no evidence of pericardial effusion. Mitral Valve: The mitral valve was not well visualized. Moderate mitral annular calcification. No evidence of mitral valve regurgitation. No evidence of mitral valve stenosis. Tricuspid Valve: The tricuspid valve is not well visualized. Tricuspid valve regurgitation is trivial. Aortic Valve: The aortic valve was not well visualized. Aortic  valve regurgitation is trivial. Aortic valve mean gradient measures 7.6 mmHg. Aortic valve peak gradient measures 15.8 mmHg. Aortic valve area, by VTI measures 1.79 cm. Pulmonic Valve: The pulmonic valve was not well visualized. Pulmonic valve regurgitation is not visualized. No evidence of pulmonic stenosis. Aorta: The aortic root is normal in size and structure. There is mild dilatation of the ascending aorta, measuring 42 mm. Pulmonary Artery: The pulmonary artery is not well seen. Venous: The inferior vena cava is normal in size with greater than 50% respiratory variability, suggesting right atrial pressure of 3 mmHg. IAS/Shunts: The interatrial septum was not well visualized.  LEFT VENTRICLE PLAX 2D LVIDd:         5.00 cm   Diastology LVIDs:         3.10 cm   LV e' medial:    5.44 cm/s LV PW:         1.20 cm   LV E/e' medial:  10.8 LV IVS:        1.20 cm   LV e' lateral:   5.28 cm/s LVOT diam:     1.90 cm   LV E/e' lateral: 11.1 LV SV:         54 LV SV Index:   27 LVOT Area:     2.84 cm  RIGHT VENTRICLE             IVC RV S prime:     17.55 cm/s  IVC diam: 0.80 cm LEFT ATRIUM             Index LA diam:        4.60 cm 2.26 cm/m LA Vol (A2C):   46.1 ml 22.69 ml/m LA Vol (A4C):   28.6 ml 14.08 ml/m LA Biplane Vol: 38.8 ml 19.10 ml/m  AORTIC VALVE AV Area (Vmax):    1.54 cm AV Area (Vmean):   1.43 cm AV Area (VTI):     1.79 cm AV Vmax:           198.92 cm/s AV Vmean:          129.675 cm/s AV VTI:            0.301 m AV Peak Grad:      15.8 mmHg AV Mean Grad:      7.6 mmHg LVOT Vmax:         108.00 cm/s LVOT Vmean:         65.467 cm/s LVOT VTI:          0.190 m LVOT/AV VTI ratio: 0.63  AORTA Ao Root diam: 3.10 cm Ao Asc diam:  4.20 cm MITRAL VALVE MV Area (PHT): 3.08 cm    SHUNTS MV Decel Time: 246 msec    Systemic VTI:  0.19 m MV E velocity: 58.70 cm/s  Systemic Diam: 1.90 cm MV A velocity: 69.20 cm/s MV E/A ratio:  0.85 Christopher End MD Electronically signed by Yvonne Kendall MD Signature Date/Time: 06/23/2022/7:06:15 AM    Final    MR BRAIN WO CONTRAST  Result Date: 06/22/2022 CLINICAL DATA:  Stroke suspected EXAM: MRI HEAD WITHOUT CONTRAST TECHNIQUE: Multiplanar, multiecho pulse sequences of the brain and surrounding structures were obtained without intravenous contrast. COMPARISON:  No prior MRI, correlation is made with CT head 06/22/2022 FINDINGS: Brain: No restricted diffusion to suggest acute or subacute infarct. No acute hemorrhage, mass, mass effect, or midline shift. No hydrocephalus or extra-axial collection. T2 hyperintense signal in the periventricular white matter, likely the sequela of mild-to-moderate chronic small  vessel ischemic disease. Empty sella. Normal craniocervical junction. Vascular: Normal arterial flow voids. Skull and upper cervical spine: Normal marrow signal. Sinuses/Orbits: No acute finding. Other: The mastoids are well aerated. IMPRESSION: No acute intracranial process. No evidence of acute or subacute infarct. Electronically Signed   By: Wiliam Ke M.D.   On: 06/22/2022 23:56   CT ANGIO HEAD NECK W WO CM  Result Date: 06/22/2022 CLINICAL DATA:  Stroke/TIA, determine embolic source EXAM: CT ANGIOGRAPHY HEAD AND NECK TECHNIQUE: Multidetector CT imaging of the head and neck was performed using the standard protocol during bolus administration of intravenous contrast. Multiplanar CT image reconstructions and MIPs were obtained to evaluate the vascular anatomy. Carotid stenosis measurements (when applicable) are obtained utilizing NASCET criteria, using the distal internal carotid  diameter as the denominator. RADIATION DOSE REDUCTION: This exam was performed according to the departmental dose-optimization program which includes automated exposure control, adjustment of the mA and/or kV according to patient size and/or use of iterative reconstruction technique. CONTRAST:  13mL OMNIPAQUE IOHEXOL 350 MG/ML SOLN COMPARISON:  None Available. FINDINGS: CTA NECK FINDINGS Aortic arch: Great vessel origins are patent without significant stenosis. Right carotid system: No evidence of dissection, stenosis (50% or greater), or occlusion. Mild atherosclerosis. Retropharyngeal course. Left carotid system: No evidence of dissection, stenosis (50% or greater), or occlusion. Retropharyngeal course. Vertebral arteries: Left dominant. No evidence of dissection, stenosis (50% or greater), or occlusion. Skeleton: No acute fracture. Other neck: No acute findings. Upper chest: Visualized lung apices are clear. Review of the MIP images confirms the above findings CTA HEAD FINDINGS Anterior circulation: Bilateral intracranial ICAs, MCAs, and ACAs are patent without proximal hemodynamically significant stenosis. Posterior circulation: Bilateral intradural vertebral arteries are patent. Mild atherosclerotic narrowing of the left intradural vertebral artery. Mild narrowing of the distal basilar artery. Bilateral posterior cerebral arteries are patent without proximal hemodynamically significant stenosis. Venous sinuses: As permitted by contrast timing, patent. Review of the MIP images confirms the above findings IMPRESSION: No emergent large vessel occlusion or proximal hemodynamically significant stenosis. Electronically Signed   By: Feliberto Harts M.D.   On: 06/22/2022 19:54   CT HEAD CODE STROKE WO CONTRAST  Result Date: 06/22/2022 CLINICAL DATA:  Code stroke. Transient ischemic attack. Right-sided paresthesia. Weakness on the left. EXAM: CT HEAD WITHOUT CONTRAST TECHNIQUE: Contiguous axial images were  obtained from the base of the skull through the vertex without intravenous contrast. RADIATION DOSE REDUCTION: This exam was performed according to the departmental dose-optimization program which includes automated exposure control, adjustment of the mA and/or kV according to patient size and/or use of iterative reconstruction technique. COMPARISON:  None FINDINGS: Brain: No sign of acute infarction, mass lesion, hemorrhage, hydrocephalus or extra-axial collection. Mild chronic small-vessel ischemic change of the cerebral hemispheric white matter. Vascular: No acute hyperdense vessel. Skull: Negative Sinuses/Orbits: Clear/normal Other: None ASPECTS (Alberta Stroke Program Early CT Score) - Ganglionic level infarction (caudate, lentiform nuclei, internal capsule, insula, M1-M3 cortex): 7 - Supraganglionic infarction (M4-M6 cortex): 3 Total score (0-10 with 10 being normal): 10 IMPRESSION: 1. No acute CT finding. Mild chronic small-vessel ischemic change of the cerebral hemispheric white matter. 2. Aspects is 10. These results were communicated to Dr. Iver Nestle at 4:21 pm on 06/22/2022 by text page via the [facility] messaging system. Electronically Signed   By: Paulina Fusi M.D.   On: 06/22/2022 16:23   DG Chest 2 View  Result Date: 06/22/2022 CLINICAL DATA:  Chest pain.  head, right arm and face burning. EXAM: CHEST -  2 VIEW COMPARISON:  Chest x-ray 04/16/2021, chest x-ray 10/09/2020 FINDINGS: The heart and mediastinal contours are unchanged. Aortic calcification. Suggestion of coronary artery stent. No focal consolidation. No pulmonary edema. No pleural effusion. No pneumothorax. No acute osseous abnormality. IMPRESSION: 1. No active cardiopulmonary disease. 2.  Aortic Atherosclerosis (ICD10-I70.0). Electronically Signed   By: Iven Finn M.D.   On: 06/22/2022 15:37   MM 3D SCREEN BREAST BILATERAL  Result Date: 06/16/2022 CLINICAL DATA:  Screening. EXAM: DIGITAL SCREENING BILATERAL MAMMOGRAM WITH  TOMOSYNTHESIS AND CAD TECHNIQUE: Bilateral screening digital craniocaudal and mediolateral oblique mammograms were obtained. Bilateral screening digital breast tomosynthesis was performed. The images were evaluated with computer-aided detection. COMPARISON:  Previous exam(s). ACR Breast Density Category b: There are scattered areas of fibroglandular density. FINDINGS: There are no findings suspicious for malignancy. IMPRESSION: No mammographic evidence of malignancy. A result letter of this screening mammogram will be mailed directly to the patient. RECOMMENDATION: Screening mammogram in one year. (Code:SM-B-01Y) BI-RADS CATEGORY  1: Negative. Electronically Signed   By: Everlean Alstrom M.D.   On: 06/16/2022 12:40   DG Bone Density  Result Date: 06/15/2022 EXAM: DUAL X-RAY ABSORPTIOMETRY (DXA) FOR BONE MINERAL DENSITY IMPRESSION: Your patient Raneshia Derick completed a BMD test on 06/15/2022 using the [employer] (software version: 14.10) manufactured by [employer]. The following summarizes the results of our evaluation. Technologist: [facility] PATIENT BIOGRAPHICAL: Name: Arlayne, Liggins Patient ID: [id number] Birth Date: 1951/08/18 Height: 62.0 in. Gender: Female Exam Date: 06/15/2022 Weight: 233.6 lbs. Indications: Advanced Age, Postmenopausal Fractures: Treatments: Calcium, Vitamin D DENSITOMETRY RESULTS: Site         Region     Measured Date Measured Age WHO Classification Young Adult T-score BMD         %Change vs. Previous Significant Change (*) AP Spine L1-L3 4/15/[age] Normal 3.3 1.589 g/cm2 DualFemur Neck Right 06/15/2020 73.6 Normal 1.0 1.183 g/cm2 Left Forearm Radius 33% 06[age]15/2020 73.6 Normal 1.2 0.981 g/cm2 ASSESSMENT: The BMD measured at Femur Neck Right is 1.183 g/cm2 with a T-score of 1.0. This patient is considered normal according to [facility]) criteria. The scan quality is good. L-4 was excluded due to degenerative changes. World Social worker [facility]) criteria for post-menopausal, Caucasian Women: Normal:                   T-score at or above -1 SD Osteopenia/low bone mass: T-score between -1 and -2.5 SD Osteoporosis:             T-score at or below -2.5 SD RECOMMENDATIONS: 1. All patients should optimize calcium and vitamin D intake. 2. Consider FDA-approved medical therapies in postmenopausal women and men aged 45 years and older, based on the following: a. A hip or vertebral(clinical or morphometric) fracture b. T-score < -2.5 at the femoral neck or spine after appropriate evaluation to exclude secondary causes c. Low bone mass (T-score between -1.0 and -2.5 at the femoral neck or spine) and a 10-year probability of a hip fracture > 3% or a 10-year probability of a major osteoporosis-related fracture > 20% based on the [place]-adapted WHO algorithm 3. Clinician judgment and/or patient preferences may indicate treatment for people with 10-year fracture probabilities above or below these levels FOLLOW-UP: People with diagnosed cases of osteoporosis or at high risk for fracture should have regular bone mineral density tests. For patients eligible for Medicare, routine testing is allowed once every 2 years. The testing frequency can be increased to one  year for patients who have rapidly progressing disease, those who are receiving or discontinuing medical therapy to restore bone mass, or have additional risk factors. I have reviewed this report, and agree with the above findings. [facility] Electronically Signed   By: Romona Curls M.D.   On: 06/15/2022 11:07      Labs: BNP (last 3 results) No results for input(s): "BNP" in the last 8760 hours. Basic Metabolic Panel: Recent Labs  Lab 06/22/22 1544  NA 135  K 3.7  CL 99  CO2 25  GLUCOSE 89  BUN 13  CREATININE 0.74  CALCIUM 10.1   Liver Function Tests: No results for input(s): "AST", "ALT", "ALKPHOS", "BILITOT", "PROT", "ALBUMIN" in the last 168 hours. No results  for input(s): "LIPASE", "AMYLASE" in the last 168 hours. No results for input(s): "AMMONIA" in the last 168 hours. CBC: Recent Labs  Lab 06/22/22 1544  WBC 5.6  NEUTROABS 3.4  HGB 13.7  HCT 43.9  MCV 81.3  PLT 224   Cardiac Enzymes: No results for input(s): "CKTOTAL", "CKMB", "CKMBINDEX", "TROPONINI" in the last 168 hours. BNP: Invalid input(s): "POCBNP" CBG: Recent Labs  Lab 06/22/22 1609  GLUCAP 78   D-Dimer No results for input(s): "DDIMER" in the last 72 hours. Hgb A1c No results for input(s): "HGBA1C" in the last 72 hours. Lipid Profile Recent Labs    06/23/22 0504  CHOL 105  HDL 28*  LDLCALC 64  TRIG 65  CHOLHDL 3.8   Thyroid function studies No results for input(s): "TSH", "T4TOTAL", "T3FREE", "THYROIDAB" in the last 72 hours.  Invalid input(s): "FREET3" Anemia work up No results for input(s): "VITAMINB12", "FOLATE", "FERRITIN", "TIBC", "IRON", "RETICCTPCT" in the last 72 hours. Urinalysis    Component Value Date/Time   COLORURINE YELLOW (A) 06/22/2022 1644   APPEARANCEUR HAZY (A) 06/22/2022 1644   APPEARANCEUR Clear 10/03/2020 1427   LABSPEC 1.006 06/22/2022 1644   PHURINE 7.0 06/22/2022 1644   GLUCOSEU NEGATIVE 06/22/2022 1644   HGBUR SMALL (A) 06/22/2022 1644   BILIRUBINUR NEGATIVE 06/22/2022 1644   BILIRUBINUR Negative 10/03/2020 1427   KETONESUR NEGATIVE 06/22/2022 1644   PROTEINUR NEGATIVE 06/22/2022 1644   NITRITE NEGATIVE 06/22/2022 1644   LEUKOCYTESUR SMALL (A) 06/22/2022 1644   Sepsis Labs Recent Labs  Lab 06/22/22 1544  WBC 5.6   Microbiology No results found for this or any previous visit (from the past 240 hour(s)).   Total time spend on discharging this patient, including the last patient exam, discussing the hospital stay, instructions for ongoing care as it relates to all pertinent caregivers, as well as preparing the medical discharge records, prescriptions, and/or referrals as applicable, is 45 minutes.    Darlin Priestly,  MD  [facility] 06/23/2022, 3:29 PM

## 2022-06-23 NOTE — Evaluation (Signed)
Physical Therapy Evaluation Patient Details Name: Jennifer Grant MRN: [id number] DOB: February 12, 1951 Today's Date: 06/23/2022  History of Present Illness  Pt is a 71 y.o. female with medical history significant of coronary artery disease, morbid obesity, diastolic dysfunction, hyperlipidemia, obstructive sleep apnea, paroxysmal atrial fibrillation, things who presented to the ER today with sudden onset of burning feeling over the right side of her face that went down her upper chest, right arm and into her right leg.  MD assessment includes: TIA and essential HTN.   Clinical Impression  Pt independent/Mod Ind with all functional mobility tasks including gait without an AD and with ascending/descending stairs with railings.  Pt presented with very good stability throughout the session and was able to complete SLS times of >/= 10 sec on each LE.  Pt presented with no deficits in strength, sensation, or coordination and reported feeling at her functional baseline. No skilled PT needs identified at this time. Will complete PT orders at this time but will reassess pt pending a change in status upon receipt of new PT orders.          Recommendations for follow up therapy are one component of a multi-disciplinary discharge planning process, led by the attending physician.  Recommendations may be updated based on patient status, additional functional criteria and insurance authorization.  Follow Up Recommendations No PT follow up      Assistance Recommended at Discharge None  Patient can return home with the following  Assist for transportation    Equipment Recommendations None recommended by PT  Recommendations for Other Services       Functional Status Assessment Patient has not had a recent decline in their functional status     Precautions / Restrictions Precautions Precautions: None Restrictions Weight Bearing Restrictions: No      Mobility  Bed Mobility Overal bed mobility:  Independent                  Transfers Overall transfer level: Independent                 General transfer comment: Good control and stability    Ambulation/Gait Ambulation/Gait assistance: Independent Gait Distance (Feet): 250 Feet Assistive device: None Gait Pattern/deviations: WFL(Within Functional Limits) Gait velocity: WNL     General Gait Details: Steady with amb without LOB or buckling including during start/stops and 90 deg turns  Stairs Stairs: Yes Stairs assistance: Modified independent (Device/Increase time) Stair Management: Two rails, Alternating pattern, Forwards Number of Stairs: 4 General stair comments: Good eccentric and concentric control and stability  Wheelchair Mobility    Modified Rankin (Stroke Patients Only)       Balance Overall balance assessment: No apparent balance deficits (not formally assessed)                                           Pertinent Vitals/Pain Pain Assessment Pain Assessment: No/denies pain    Home Living Family/patient expects to be discharged to:: Private residence Living Arrangements: Children;Other relatives Available Help at Discharge: Family;Available 24 hours/day Type of Home: House Home Access: Stairs to enter Entrance Stairs-Rails: Right;Left;Can reach both Entrance Stairs-Number of Steps: 2   Home Layout: Two level;Able to live on main level with bedroom/bathroom Home Equipment: None      Prior Function Prior Level of Function : Independent/Modified Independent  Mobility Comments: Ind amb community distances without AD, no fall history ADLs Comments: Ind with ADLs     Hand Dominance        Extremity/Trunk Assessment   Upper Extremity Assessment Upper Extremity Assessment: Overall WFL for tasks assessed;RUE deficits/detail;LUE deficits/detail RUE Deficits / Details: Strength WNL RUE Sensation: WNL RUE Coordination: WNL LUE Deficits / Details:  Strength WNL LUE Sensation: WNL LUE Coordination: WNL    Lower Extremity Assessment Lower Extremity Assessment: Overall WFL for tasks assessed;RLE deficits/detail;LLE deficits/detail RLE Deficits / Details: Strength WNL RLE Sensation: WNL RLE Coordination: WNL LLE Deficits / Details: Strength WNL LLE Sensation: WNL LLE Coordination: WNL       Communication   Communication: No difficulties  Cognition Arousal/Alertness: Awake/alert Behavior During Therapy: WFL for tasks assessed/performed Overall Cognitive Status: Within Functional Limits for tasks assessed                                          General Comments General comments (skin integrity, edema, etc.): Pt steady with feet together and eyes closed; SLS time >/= 10 sec on each LE    Exercises     Assessment/Plan    PT Assessment Patient does not need any further PT services  PT Problem List         PT Treatment Interventions      PT Goals (Current goals can be found in the Care Plan section)  Acute Rehab PT Goals PT Goal Formulation: All assessment and education complete, DC therapy    Frequency       Co-evaluation               AM-PAC PT "6 Clicks" Mobility  Outcome Measure Help needed turning from your back to your side while in a flat bed without using bedrails?: None Help needed moving from lying on your back to sitting on the side of a flat bed without using bedrails?: None Help needed moving to and from a bed to a chair (including a wheelchair)?: None Help needed standing up from a chair using your arms (e.g., wheelchair or bedside chair)?: None Help needed to walk in hospital room?: None Help needed climbing 3-5 steps with a railing? : None 6 Click Score: 24    End of Session Equipment Utilized During Treatment: Gait belt Activity Tolerance: Patient tolerated treatment well Patient left: in chair;with call bell/phone within reach Nurse Communication: Mobility status PT  Visit Diagnosis: Muscle weakness (generalized) (M62.81)    Time: 7989-2119 PT Time Calculation (min) (ACUTE ONLY): 19 min   Charges:   PT Evaluation $PT Eval Low Complexity: 1 Low         D. Scott Ammar Moffatt PT, DPT 06/23/22, 10:48 AM

## 2022-06-23 NOTE — Progress Notes (Signed)
To EEG again for test, miscommunication. Test not previously completed.

## 2022-06-23 NOTE — Progress Notes (Signed)
SLP Cancellation Note  Patient Details Name: Jennifer Grant MRN: [id number] DOB: [birth year]   Cancelled treatment:       Reason Eval/Treat Not Completed: SLP screened, no needs identified, will sign off  Tasnim Balentine B. Rutherford Nail, M.S., CCC-SLP, [place] Pathologist Certified Brain Injury Specialist [facility]  [facility] [phone] Ascom [phone] Fax [phone]  Stormy Fabian 06/23/2022, 9:58 AM

## 2022-06-23 NOTE — Plan of Care (Signed)
Goals met adequate for discharge

## 2022-06-23 NOTE — Progress Notes (Signed)
Returned to room ,AAO x4. Denies c/o.

## 2022-06-23 NOTE — Progress Notes (Signed)
OT Cancellation Note  Patient Details Name: Jennifer Grant MRN: [id number] DOB: 1950/12/22   Cancelled Treatment:    Reason Eval/Treat Not Completed: OT screened, no needs identified, will sign off. OT orders received, chart reviewed. Pt reports completing functional mobility to the bathroom and toileting tasks with no physical assistance. Pt denies any concerns from an OT standpoint. Seems to be at functional baseline, pt agrees. No skilled OT needs indicated at this time. Please re-consult if there are any acute changes.   Doneta Public 06/23/2022, 11:53 AM

## 2022-06-24 ENCOUNTER — Telehealth: Payer: Self-pay

## 2022-06-24 NOTE — Telephone Encounter (Signed)
Noted  

## 2022-06-24 NOTE — Telephone Encounter (Signed)
Transition Care Management Unsuccessful Follow-up Telephone Call  Date of discharge and from where:  [facility] 06-23-22 Dx: TIA  Attempts:  1st Attempt  Reason for unsuccessful TCM follow-up call:  Unable to leave message   Juanda Crumble LPN [place] [phone]   Transition Care Management Follow-up Telephone Call Date of discharge and from where: [facility] 06-23-22 Dx: TIA How have you been since you were released from the hospital? Doing good Any questions or concerns? No  Items Reviewed: Did the pt receive and understand the discharge instructions provided? Yes  Medications obtained and verified? Yes  Other? No  Any new allergies since your discharge? No  Dietary orders reviewed? Yes Do you have support at home? Yes   Home Care and Equipment/Supplies: Were home health services ordered? no If so, what is the name of the agency? na  Has the agency set up a time to come to the patient's home? not applicable Were any new equipment or medical supplies ordered?  No What is the name of the medical supply agency? na Were you able to get the supplies/equipment? not applicable Do you have any questions related to the use of the equipment or supplies? No  Functional Questionnaire: (I = Independent and D = Dependent) ADLs: I  Bathing/Dressing- I  Meal Prep- I  Eating- I  Maintaining continence- I  Transferring/Ambulation- I  Managing Meds- I  Follow up appointments reviewed:  PCP Hospital f/u appt confirmed? Yes  Scheduled to see Dr Ned Card on 06-28-22 @ [facility] f/u appt confirmed? No . Are transportation arrangements needed? No  If their condition worsens, is the pt aware to call PCP or go to the Emergency Dept.? Yes Was the patient provided with contact information for the PCP's office or ED? Yes Was to pt encouraged to call back with questions or concerns? Yes    Juanda Crumble LPN [place] Direct Dial [phone]

## 2022-06-27 NOTE — Patient Instructions (Signed)
Transient Ischemic Attack A transient ischemic attack (TIA) causes the same symptoms as a stroke, but the symptoms go away quickly. A TIA happens when blood flow to the brain is blocked. Having a TIA means you may be at risk for a stroke. A TIA is a medical emergency. What are the causes? A TIA is caused by a blocked artery in the head or neck. This means the brain does not get the blood supply it needs. A blockage can be caused by: Fatty buildup in an artery in the head or neck. A blood clot. A tear in an artery. Irritation and swelling (inflammation) of an artery. Sometimes the cause is not known. What increases the risk? Certain things may make you more likely to have a TIA. Some of these are things that you can change, such as: Using products that have nicotine or tobacco. Not being active. Drinking too much alcohol. Using recreational drugs. Health conditions that may increase your risk include: High blood pressure. High cholesterol. Diabetes. Heart disease. A heartbeat that is not regular (atrial fibrillation). Sickle cell disease. Problems with blood clotting. Other risk factors include: Being over the age of 60. Being female. Being very overweight. Sleep problems (sleep apnea). Having a family history of stroke. Having had blood clots, stroke, TIA, or heart attack in the past. What are the signs or symptoms? The symptoms of a TIA are like those of a stroke. They can include: Weakness or loss of feeling in your face, arm, or leg. This often happens on one side of your body. Trouble walking. Trouble moving your arms or legs. Trouble talking or understanding what people are saying. Problems with how you see. Feeling dizzy. Feeling confused. Loss of balance or coordination. Feeling like you may vomit (nausea) or vomiting. Having a very bad headache. If you can, note what time you started to have symptoms. Tell your doctor. How is this treated? The goal of treatment is to  lower the risk for a stroke. This may include: Changes to diet and lifestyle, such as getting regular exercise and stopping smoking. Taking medicines to: Thin the blood. Lower blood pressure. Lower cholesterol. Treating other health conditions, such as diabetes. If testing shows that an artery in your brain is narrow, your doctor may recommend a procedure to: Take the blockage out of your artery. Open or widen an artery in your neck (carotid angioplasty and stenting). Follow these instructions at home: Medicines Take over-the-counter and prescription medicines only as told by your doctor. If you were told to take aspirin or another medicine to thin your blood, use it exactly as told by your doctor. Taking too much of the medicine can cause bleeding. Taking too little of the medicine may not work to treat the problem. Eating and drinking  Eat 5 or more servings of fruits and vegetables each day. Follow instructions from your doctor about your diet. You may need to follow a certain diet to help lower your risk of a stroke. You may need to: Eat a diet that is low in fat and salt. Eat foods with a lot of fiber. Limit carbohydrates and sugar. If you drink alcohol: Limit how much you have to: 0-1 drink a day for women who are not pregnant. 0-2 drinks a day for men. Know how much alcohol is in a drink. In the [place] one drink equals one 12 oz bottle of beer (355 mL), one 5 oz glass of wine (148 mL), or one 1 oz glass of hard   liquor (44 mL). General instructions Keep a healthy weight. Try to get at least 30 minutes of exercise on most days. Get treatment if you have sleep problems. Do not smoke or use any products that contain nicotine or tobacco. If you need help quitting, ask your doctor. Do not use drugs. Keep all follow-up visits. Your doctor will want to know if you have any more symptoms and to check blood labs if any medicines were prescribed. Where to find more  information American Stroke Association: stroke.org Get help right away if: You have chest pain. You have a heartbeat that is not regular. You have any signs of a stroke. "BE FAST" is an easy way to remember the main warning signs: B - Balance. Dizziness, sudden trouble walking, or loss of balance. E - Eyes. Trouble seeing or a change in how you see. F - Face. Sudden weakness or loss of feeling of the face. The face or eyelid may droop on one side. A - Arms. Weakness or loss of feeling in an arm. This happens all of a sudden and most often on one side of the body. S - Speech. Sudden trouble speaking, slurred speech, or trouble understanding what people say. T - Time. Time to call emergency services. Write down what time symptoms started. You have other signs of a stroke, such as: A sudden, very bad headache with no known cause. Feeling like you may vomit. Vomiting. A seizure. These symptoms may be an emergency. Get help right away. Call 911. Do not wait to see if the symptoms will go away. Do not drive yourself to the hospital. This information is not intended to replace advice given to you by your health care provider. Make sure you discuss any questions you have with your health care provider. Document Revised: 01/29/2022 Document Reviewed: 01/29/2022 Elsevier Patient Education  2023 [employer].  

## 2022-06-28 ENCOUNTER — Ambulatory Visit (INDEPENDENT_AMBULATORY_CARE_PROVIDER_SITE_OTHER): Payer: Medicare Other | Admitting: Nurse Practitioner

## 2022-06-28 ENCOUNTER — Encounter: Payer: Self-pay | Admitting: Nurse Practitioner

## 2022-06-28 VITALS — BP 136/82 | HR 70 | Temp 98.2°F | Ht 63.0 in | Wt 224.1 lb

## 2022-06-28 DIAGNOSIS — G459 Transient cerebral ischemic attack, unspecified: Secondary | ICD-10-CM

## 2022-06-28 DIAGNOSIS — I25118 Atherosclerotic heart disease of native coronary artery with other forms of angina pectoris: Secondary | ICD-10-CM | POA: Diagnosis not present

## 2022-06-28 DIAGNOSIS — I10 Essential (primary) hypertension: Secondary | ICD-10-CM | POA: Diagnosis not present

## 2022-06-28 DIAGNOSIS — I5032 Chronic diastolic (congestive) heart failure: Secondary | ICD-10-CM

## 2022-06-28 DIAGNOSIS — H1032 Unspecified acute conjunctivitis, left eye: Secondary | ICD-10-CM

## 2022-06-28 DIAGNOSIS — I471 Supraventricular tachycardia, unspecified: Secondary | ICD-10-CM | POA: Diagnosis not present

## 2022-06-28 DIAGNOSIS — I252 Old myocardial infarction: Secondary | ICD-10-CM

## 2022-06-28 DIAGNOSIS — E782 Mixed hyperlipidemia: Secondary | ICD-10-CM | POA: Diagnosis not present

## 2022-06-28 DIAGNOSIS — Z91148 Patient's other noncompliance with medication regimen for other reason: Secondary | ICD-10-CM

## 2022-06-28 DIAGNOSIS — Z6841 Body Mass Index (BMI) 40.0 and over, adult: Secondary | ICD-10-CM

## 2022-06-28 MED ORDER — ERYTHROMYCIN 5 MG/GM OP OINT: 1.0000 | TOPICAL_OINTMENT | Freq: Every day | OPHTHALMIC | 0 refills | Status: AC

## 2022-06-28 NOTE — Assessment & Plan Note (Signed)
Ongoing issue, will continue to involve CCM team to assist.

## 2022-06-28 NOTE — Assessment & Plan Note (Signed)
Followed by cardiology, continue this collaboration.  Is not taking Metoprolol, reports she has tried off and on for years and did not tolerate.

## 2022-06-28 NOTE — Assessment & Plan Note (Signed)
One event -- will place referral to [facility] neurology as recommended.  She denies any symptoms or recurrence today.

## 2022-06-28 NOTE — Assessment & Plan Note (Signed)
Chronic, ongoing.  Euvolemic on exam today.  Continue current medication regimen and collaboration with cardiology.  Discussed diet choices and to cut back on sodium intake, she eats a higher sodium diet with lots of spicier foods.   - Reminded to call for an overnight weight gain of >2 pounds or a weekly weight weight of >5 pounds - not adding salt to his food and has been reading food labels. Reviewed the importance of keeping daily sodium intake to 2000mg  daily  - No Ibuprofen products

## 2022-06-28 NOTE — Assessment & Plan Note (Signed)
BMI 39.70 with HTN, OLD MI, HLD. Recommended eating smaller high protein, low fat meals more frequently and exercising 30 mins a day 5 times a week with a goal of 10-15lb weight loss in the next 3 months. Patient voiced their understanding and motivation to adhere to these recommendations.

## 2022-06-28 NOTE — Assessment & Plan Note (Signed)
Chronic, ongoing.  BP at goal in office on recheck.  Is cutting back on sodium.  Does not consistently take medication.  Recommend ensuring medication taken daily + checking BP at home at least a few mornings a week.  Continue collaboration with cardiology and review notes.  Continue current medication regimen at this time, suspect adjustments will need to be made in future.  Labs: BMP and CBC.

## 2022-06-28 NOTE — Progress Notes (Signed)
BP 136/82 (BP Location: Left Arm, Patient Position: Sitting)   Pulse 70   Temp 98.2 F (36.8 C) (Oral)   Ht 5\' 3"  (1.6 m)   Wt 224 lb 1.6 oz (101.7 kg)   LMP  (LMP Unknown)   SpO2 95%   BMI 39.70 kg/m    Subjective:    Patient ID: , female    DOB: 12-24-1950, 71 y.o.   MRN: [id number]  HPI: Jennifer Grant is a 71 y.o. female  Chief Complaint  Patient presents with   Eye Problem    Patient says she recently washed her hair over the weekend. Patient says it feels as if something is in it and says it feels as if gravel is in it. Patient says she has not been able to open it fully. Patient says her daughter got her an eye wash and says it has not really help.   Hospitalization Follow-up    TIA.   Transition of Care Hospital Follow up.  She reports going to hospital due to burning that started in right side of face, running down right arm, right upper back.  Reports she had not taken medication at the time yet.  They did work-up in hospital with no stroke seen.  EEG negative. She is to follow-up with neurology, [facility] -- she has not scheduled.   "Hospital Course:  For full details, please see H&P, progress notes, consult notes and ancillary notes.   Jennifer Grant is a 71 y.o. female with medical history significant of coronary artery disease, morbid obesity, diastolic dysfunction, hyperlipidemia, obstructive sleep apnea, paroxysmal atrial fibrillation, who presented to the ER today with sudden onset of burning feeling over the right side of her face around the ER and then went down her upper chest, right arm and into her right leg.   #1 right-sided burning pain symptoms --stroke workup neg.  Pt has had long-term cardiac monitor back in Jun 2023 that was neg for Afib.  Neuro consulted, and noted symptoms described suggested jacksonian march.  Spot EEG neg.   --if pt continues to have recurrent symptoms she could consider a trial of antiseizure medications  (she continues to wish to minimize medications) --Patient prefers outpatient follow-up with [facility] neurology --cont home ASA 81 and statin   #2 essential hypertension:  Continue blood pressure control using home regimen as below in med list.   #3 morbid obesity, BMI 39.79   #4 coronary artery disease:  --cont home ASA 81 and statin   #5 chronic diastolic heart failure:  Compensated.   Home potassium supplement d/c'ed since pt is supposed to be on losartan and aldactone (though may not be compliant before)"  Hospital/Facility: [facility] D/C Physician: Dr. [facility] Date: 06/23/22  Records Requested: 06/28/22 Records Received: 06/28/22 Records Reviewed: 06/28/22  Diagnoses on Discharge: TIA  Date of interactive Contact within 48 hours of discharge:  Contact was through: phone  Date of 7 day or 14 day face-to-face visit:    within 7 days  Outpatient Encounter Medications as of 06/28/2022  Medication Sig Note   amLODipine (NORVASC) 10 MG tablet Take 1 tablet (10 mg total) by mouth daily.    aspirin EC 81 MG tablet Take 81 mg by mouth daily.    cholecalciferol (VITAMIN D3) 25 MCG (1000 UNIT) tablet Take 1,000 Units by mouth daily.    clopidogrel (PLAVIX) 75 MG tablet Take 1 tablet by mouth once daily    diclofenac Sodium (VOLTAREN)  1 % GEL Apply 2 g topically 4 (four) times daily. Apply to right knee    erythromycin ophthalmic ointment Place 1 Application into both eyes at bedtime for 7 days.    famotidine (PEPCID) 20 MG tablet Take 1 tablet (20 mg total) by mouth 2 (two) times daily.    hydrochlorothiazide (HYDRODIURIL) 25 MG tablet Take 1 tablet (25 mg total) by mouth daily.    isosorbide mononitrate (IMDUR) 60 MG 24 hr tablet Take 1 tablet (60 mg total) by mouth daily.    lidocaine (LIDODERM) 5 % Place 1 patch onto the skin daily. Remove & Discard patch within 12 hours or as directed by MD    losartan (COZAAR) 100 MG tablet Take 1 tablet (100 mg total) by mouth daily.     nitroGLYCERIN (NITROSTAT) 0.4 MG SL tablet DISSOLVE ONE TABLET UNDER THE TONGUE EVERY 5 MINUTES AS NEEDED FOR CHEST PAIN.  DO NOT EXCEED A TOTAL OF 3 DOSES IN 15 MINUTES 06/22/2022: Pt requesting Rx to take home   rosuvastatin (CRESTOR) 40 MG tablet Take 1 tablet (40 mg total) by mouth daily.    spironolactone (ALDACTONE) 25 MG tablet Take 1 tablet (25 mg total) by mouth every other day.    UNABLE TO FIND Neo-Cell    No facility-administered encounter medications on file as of 06/28/2022.    Diagnostic Tests Reviewed/Disposition: recent labs and imaging reviewed  Consults: neurology  Discharge Instructions: To follow-up with PCP and neurology  Disease/illness Education: Reviewed with patient at bedside  Home Health/Community Services Discussions/Referrals: None  Establishment or re-establishment of referral orders for community resources: None  Discussion with other health care providers: Reviewed all recent notes  Assessment and Support of treatment regimen adherence: Reviewed with patient at bedside  Appointments Coordinated with: Reviewed with patient at bedside  Education for self-management, independent living, and ADLs:  Reviewed with patient at bedside  HYPERTENSION / HYPERLIPIDEMIA/HF Currently taking Losartan, Amlodipine, HCTZ, Isosorbide Mono, Plavix, and ASA + Rosuvastatin.  Is to be taking Metoprolol XL 25 MG daily due to PAF while in hospital for MI = makes her feel sick and is not taking - tried taking for a long time. Saw cardiology on 04/29/22 = they discussed with her adding on Zetia and SGLT2, both of which she refuses as does not want more medications.     No recent NTG use.  Continues to endorse poor diet choices -- eats lots of spicy food and high sodium.     June 2023 echo EF 50-55%, chronic diastolic HF diagnosis on chart. History of MI in 2004.   Satisfied with current treatment? yes Duration of hypertension: chronic BP monitoring frequency: rarely BP  range: <130/80 on average she reports BP medication side effects: no Duration of hyperlipidemia: chronic Cholesterol medication side effects: no Cholesterol supplements: none Medication compliance: good compliance Aspirin: yes Recent stressors: no Recurrent headaches: no Visual changes: no Palpitations: no Dyspnea: none Chest pain: no Lower extremity edema: no Dizzy/lightheaded: no  The ASCVD Risk score (Arnett DK, et al., 2019) failed to calculate for the following reasons:   The patient has a prior MI or stroke diagnosis  EYE PAIN Feels like gravel in it, started 2 days ago.  She thinks she got shampoo in eye and that irritated it.  Has been rinsing eyes out with water and eye wash her daughter purchased. Duration:  days Involved eye:  left Onset: gradual Severity: mild  Quality: aching and burning Foreign body sensation:yes Visual impairment: no Eye  redness: yes Discharge: no Crusting or matting of eyelids: yes Swelling:  mild Photophobia:  mild Itching: yes Tearing: yes Headache: no Floaters: no URI symptoms: no Contact lens use: no Close contacts with similar problems: no Eye trauma: no Aggravating factors: unknown Alleviating factors: eye rinse Status: fluctuating Treatments attempted: eye rinse  Relevant past medical, surgical, family and social history reviewed and updated as indicated. Interim medical history since our last visit reviewed. Allergies and medications reviewed and updated.  Review of Systems  Constitutional:  Negative for activity change, appetite change, diaphoresis, fatigue and fever.  Eyes:  Positive for photophobia, discharge, redness and itching. Negative for pain.  Respiratory:  Negative for cough, chest tightness and shortness of breath.   Cardiovascular:  Negative for chest pain, palpitations and leg swelling.  Gastrointestinal: Negative.   Endocrine: Negative for polydipsia, polyphagia and polyuria.  Neurological: Negative.    Psychiatric/Behavioral: Negative.      Per HPI unless specifically indicated above     Objective:    BP 136/82 (BP Location: Left Arm, Patient Position: Sitting)   Pulse 70   Temp 98.2 F (36.8 C) (Oral)   Ht 5\' 3"  (1.6 m)   Wt 224 lb 1.6 oz (101.7 kg)   LMP  (LMP Unknown)   SpO2 95%   BMI 39.70 kg/m   Wt Readings from Last 3 Encounters:  06/28/22 224 lb 1.6 oz (101.7 kg)  06/22/22 224 lb 10.4 oz (101.9 kg)  06/07/22 233 lb 9.6 oz (106 kg)    Physical Exam Vitals and nursing note reviewed.  Constitutional:      General: She is awake. She is not in acute distress.    Appearance: She is well-developed and well-groomed. She is morbidly obese. She is not ill-appearing.  HENT:     Head: Normocephalic.     Right Ear: Hearing normal.     Left Ear: Hearing normal.  Eyes:     General: Lids are normal.        Right eye: No discharge.        Left eye: No discharge.     Conjunctiva/sclera: Conjunctivae normal.     Pupils: Pupils are equal, round, and reactive to light.  Neck:     Thyroid: No thyromegaly.     Vascular: No carotid bruit.  Cardiovascular:     Rate and Rhythm: Normal rate and regular rhythm.     Heart sounds: Normal heart sounds. No murmur heard.    No gallop.  Pulmonary:     Effort: Pulmonary effort is normal. No accessory muscle usage or respiratory distress.     Breath sounds: Normal breath sounds.  Abdominal:     General: Bowel sounds are normal.     Palpations: Abdomen is soft.     Tenderness: There is no abdominal tenderness.  Musculoskeletal:     Cervical back: Normal range of motion and neck supple.     Right knee:     Instability Tests: Posterior drawer test negative. Medial McMurray test negative and lateral McMurray test negative.     Left knee: Normal.     Right lower leg: Edema (trace) present.     Left lower leg: Edema (trace) present.  Skin:    General: Skin is warm and dry.  Neurological:     Mental Status: She is alert and oriented to  person, place, and time.  Psychiatric:        Attention and Perception: Attention normal.  Mood and Affect: Mood normal.        Speech: Speech normal.        Behavior: Behavior normal. Behavior is cooperative.        Thought Content: Thought content normal.    Results for orders placed or performed during the hospital encounter of 06/22/22  Basic metabolic panel  Result Value Ref Range   Sodium 135 135 - 145 mmol/L   Potassium 3.7 3.5 - 5.1 mmol/L   Chloride 99 98 - 111 mmol/L   CO2 25 22 - 32 mmol/L   Glucose, Bld 89 70 - 99 mg/dL   BUN 13 8 - 23 mg/dL   Creatinine, Ser 4.090.74 0.44 - 1.00 mg/dL   Calcium 81.110.1 8.9 - 91.410.3 mg/dL   GFR, Estimated >78>60 >29>60 mL/min   Anion gap 11 5 - 15  Ethanol  Result Value Ref Range   Alcohol, Ethyl (B) <10 <10 mg/dL  Protime-INR  Result Value Ref Range   Prothrombin Time 14.1 11.4 - 15.2 seconds   INR 1.1 0.8 - 1.2  APTT  Result Value Ref Range   aPTT 31 24 - 36 seconds  Urine Drug Screen, Qualitative  Result Value Ref Range   Tricyclic, Ur Screen NONE DETECTED NONE DETECTED   Amphetamines, Ur Screen NONE DETECTED NONE DETECTED   MDMA (Ecstasy)Ur Screen NONE DETECTED NONE DETECTED   Cocaine Metabolite,Ur [place] NONE DETECTED NONE DETECTED   Opiate, Ur Screen NONE DETECTED NONE DETECTED   Phencyclidine (PCP) Ur S NONE DETECTED NONE DETECTED   Cannabinoid 50 Ng, Ur [place] NONE DETECTED NONE DETECTED   Barbiturates, Ur Screen NONE DETECTED NONE DETECTED   Benzodiazepine, Ur Scrn NONE DETECTED NONE DETECTED   Methadone Scn, Ur NONE DETECTED NONE DETECTED  Urinalysis, Routine w reflex microscopic  Result Value Ref Range   Color, Urine YELLOW (A) YELLOW   APPearance HAZY (A) CLEAR   Specific Gravity, Urine 1.006 1.005 - 1.030   pH 7.0 5.0 - 8.0   Glucose, UA NEGATIVE NEGATIVE mg/dL   Hgb urine dipstick SMALL (A) NEGATIVE   Bilirubin Urine NEGATIVE NEGATIVE   Ketones, ur NEGATIVE NEGATIVE mg/dL   Protein, ur NEGATIVE NEGATIVE mg/dL   Nitrite  NEGATIVE NEGATIVE   Leukocytes,Ua SMALL (A) NEGATIVE   RBC / HPF 0-5 0 - 5 RBC/hpf   WBC, UA 0-5 0 - 5 WBC/hpf   Bacteria, UA RARE (A) NONE SEEN   Squamous Epithelial / LPF 11-20 0 - 5   Mucus PRESENT   CBC with Differential/Platelet  Result Value Ref Range   WBC 5.6 4.0 - 10.5 K/uL   RBC 5.40 (H) 3.87 - 5.11 MIL/uL   Hemoglobin 13.7 12.0 - 15.0 g/dL   HCT 56.243.9 13.036.0 - 86.546.0 %   MCV 81.3 80.0 - 100.0 fL   MCH 25.4 (L) 26.0 - 34.0 pg   MCHC 31.2 30.0 - 36.0 g/dL   RDW 78.414.7 69.611.5 - 29.515.5 %   Platelets 224 150 - 400 K/uL   nRBC 0.0 0.0 - 0.2 %   Neutrophils Relative % 60 %   Neutro Abs 3.4 1.7 - 7.7 K/uL   Lymphocytes Relative 29 %   Lymphs Abs 1.6 0.7 - 4.0 K/uL   Monocytes Relative 8 %   Monocytes Absolute 0.4 0.1 - 1.0 K/uL   Eosinophils Relative 2 %   Eosinophils Absolute 0.1 0.0 - 0.5 K/uL   Basophils Relative 1 %   Basophils Absolute 0.0 0.0 - 0.1 K/uL   Immature  Granulocytes 0 %   Abs Immature Granulocytes 0.01 0.00 - 0.07 K/uL  Lipid panel  Result Value Ref Range   Cholesterol 105 0 - 200 mg/dL   Triglycerides 65 <932 mg/dL   HDL 28 (L) >67 mg/dL   Total CHOL/HDL Ratio 3.8 RATIO   VLDL 13 0 - 40 mg/dL   LDL Cholesterol 64 0 - 99 mg/dL  CBG monitoring, ED  Result Value Ref Range   Glucose-Capillary 78 70 - 99 mg/dL  ECHOCARDIOGRAM COMPLETE  Result Value Ref Range   BP 153/89 mmHg   S' Lateral 3.10 cm   Area-P 1/2 3.08 cm2   AV Area VTI 1.79 cm2   AR max vel 1.54 cm2   AV Area mean vel 1.43 cm2   Ao pk vel 1.99 m/s   AV Peak grad 15.8 mmHg   AV Mean grad 7.6 mmHg  Troponin I (High Sensitivity)  Result Value Ref Range   Troponin I (High Sensitivity) 12 <18 ng/L  Troponin I (High Sensitivity)  Result Value Ref Range   Troponin I (High Sensitivity) 15 <18 ng/L      Assessment & Plan:   Problem List Items Addressed This Visit       Cardiovascular and Mediastinum   Atherosclerotic heart disease of native coronary artery with other forms of angina pectoris  (HCC)    Chronic, ongoing.  Continue current medication regimen and collaboration with cardiology.  No recent NTG use, will send in refill as needed.      Relevant Orders   Basic metabolic panel   Chronic diastolic heart failure (HCC) - Primary    Chronic, ongoing.  Euvolemic on exam today.  Continue current medication regimen and collaboration with cardiology.  Discussed diet choices and to cut back on sodium intake, she eats a higher sodium diet with lots of spicier foods.   - Reminded to call for an overnight weight gain of >2 pounds or a weekly weight weight of >5 pounds - not adding salt to his food and has been reading food labels. Reviewed the importance of keeping daily sodium intake to 2000mg  daily  - No Ibuprofen products      Relevant Orders   Basic metabolic panel   CBC with Differential/Platelet   Essential hypertension    Chronic, ongoing.  BP at goal in office on recheck.  Is cutting back on sodium.  Does not consistently take medication.  Recommend ensuring medication taken daily + checking BP at home at least a few mornings a week.  Continue collaboration with cardiology and review notes.  Continue current medication regimen at this time, suspect adjustments will need to be made in future.  Labs: BMP and CBC.        Relevant Orders   Basic metabolic panel   SVT (supraventricular tachycardia)    Followed by cardiology, continue this collaboration.  Is not taking Metoprolol, reports she has tried off and on for years and did not tolerate.      Relevant Orders   Basic metabolic panel   TIA (transient ischemic attack)    One event -- will place referral to [facility] neurology as recommended.  She denies any symptoms or recurrence today.        Relevant Orders   Ambulatory referral to Neurology     Other   Hyperlipidemia    Chronic, ongoing.  Continue current medication regimen and adjust as needed.  Lipid panel today.  Have recommended she take this daily to help  with prevention.  Discussed this at length with patient.      Morbid obesity (HCC)    BMI 39.70 with HTN, OLD MI, HLD. Recommended eating smaller high protein, low fat meals more frequently and exercising 30 mins a day 5 times a week with a goal of 10-15lb weight loss in the next 3 months. Patient voiced their understanding and motivation to adhere to these recommendations.       Noncompliance with medication regimen    Ongoing issue, will continue to involve CCM team to assist.      Other Visit Diagnoses     Acute bacterial conjunctivitis of left eye       Erythromycin ointment sent in to pharmacy and educated patient on this.        Follow up plan: Return for as schedule April 9th,2023.

## 2022-06-28 NOTE — Assessment & Plan Note (Signed)
Chronic, ongoing.  Continue current medication regimen and adjust as needed.  Lipid panel today.  Have recommended she take this daily to help with prevention.  Discussed this at length with patient.

## 2022-06-28 NOTE — Assessment & Plan Note (Signed)
Chronic, ongoing.  Continue current medication regimen and collaboration with cardiology.  No recent NTG use, will send in refill as needed. 

## 2022-06-29 LAB — CBC WITH DIFFERENTIAL/PLATELET
Basophils Absolute: 0.1 10*3/uL (ref 0.0–0.2)
Basos: 1 %
EOS (ABSOLUTE): 0.2 10*3/uL (ref 0.0–0.4)
Eos: 3 %
Hematocrit: 44.7 % (ref 34.0–46.6)
Hemoglobin: 14.6 g/dL (ref 11.1–15.9)
Immature Grans (Abs): 0 10*3/uL (ref 0.0–0.1)
Immature Granulocytes: 0 %
Lymphocytes Absolute: 2.6 10*3/uL (ref 0.7–3.1)
Lymphs: 40 %
MCH: 26.1 pg — ABNORMAL LOW (ref 26.6–33.0)
MCHC: 32.7 g/dL (ref 31.5–35.7)
MCV: 80 fL (ref 79–97)
Monocytes Absolute: 0.4 10*3/uL (ref 0.1–0.9)
Monocytes: 7 %
Neutrophils Absolute: 3.2 10*3/uL (ref 1.4–7.0)
Neutrophils: 49 %
Platelets: 244 10*3/uL (ref 150–450)
RBC: 5.59 x10E6/uL — ABNORMAL HIGH (ref 3.77–5.28)
RDW: 14.6 % (ref 11.7–15.4)
WBC: 6.5 10*3/uL (ref 3.4–10.8)

## 2022-06-29 LAB — BASIC METABOLIC PANEL
BUN/Creatinine Ratio: 10 — ABNORMAL LOW (ref 12–28)
BUN: 8 mg/dL (ref 8–27)
CO2: 23 mmol/L (ref 20–29)
Calcium: 11 mg/dL — ABNORMAL HIGH (ref 8.7–10.3)
Chloride: 97 mmol/L (ref 96–106)
Creatinine, Ser: 0.81 mg/dL (ref 0.57–1.00)
Glucose: 86 mg/dL (ref 70–99)
Potassium: 4 mmol/L (ref 3.5–5.2)
Sodium: 138 mmol/L (ref 134–144)
eGFR: 77 mL/min/{1.73_m2} (ref >59)

## 2022-06-29 NOTE — Progress Notes (Signed)
Contacted via [place] -- but please call to see if she received: Good afternoon Jennifer Grant, your labs have returned and overall remain stable.  Calcium level mildly elevated, recommend if taking any TUMS or calcium supplements cut back on these to every other day.  Any questions? Keep being stellar!!  Thank you for allowing me to participate in your care.  I appreciate you. Kindest regards, Felicity Penix

## 2022-07-04 ENCOUNTER — Emergency Department
Admission: EM | Admit: 2022-07-04 | Discharge: 2022-07-04 | Disposition: A | Discharge status: Home / Self Care | Payer: Medicare Other | Attending: Emergency Medicine | Admitting: Emergency Medicine

## 2022-07-04 ENCOUNTER — Emergency Department: Payer: Medicare Other

## 2022-07-04 ENCOUNTER — Other Ambulatory Visit: Payer: Self-pay

## 2022-07-04 DIAGNOSIS — I4891 Unspecified atrial fibrillation: Secondary | ICD-10-CM | POA: Diagnosis not present

## 2022-07-04 DIAGNOSIS — Z743 Need for continuous supervision: Secondary | ICD-10-CM | POA: Diagnosis not present

## 2022-07-04 DIAGNOSIS — R0689 Other abnormalities of breathing: Secondary | ICD-10-CM | POA: Diagnosis not present

## 2022-07-04 DIAGNOSIS — I10 Essential (primary) hypertension: Secondary | ICD-10-CM | POA: Diagnosis not present

## 2022-07-04 DIAGNOSIS — R6889 Other general symptoms and signs: Secondary | ICD-10-CM | POA: Diagnosis not present

## 2022-07-04 DIAGNOSIS — I2510 Atherosclerotic heart disease of native coronary artery without angina pectoris: Secondary | ICD-10-CM | POA: Insufficient documentation

## 2022-07-04 DIAGNOSIS — M79651 Pain in right thigh: Secondary | ICD-10-CM | POA: Insufficient documentation

## 2022-07-04 DIAGNOSIS — M79661 Pain in right lower leg: Secondary | ICD-10-CM | POA: Diagnosis not present

## 2022-07-04 DIAGNOSIS — I499 Cardiac arrhythmia, unspecified: Secondary | ICD-10-CM | POA: Diagnosis not present

## 2022-07-04 DIAGNOSIS — R079 Chest pain, unspecified: Secondary | ICD-10-CM | POA: Diagnosis not present

## 2022-07-04 LAB — CBC WITH DIFFERENTIAL/PLATELET
Abs Immature Granulocytes: 0.01 10*3/uL (ref 0.00–0.07)
Basophils Absolute: 0.1 10*3/uL (ref 0.0–0.1)
Basophils Relative: 1 %
Eosinophils Absolute: 0.1 10*3/uL (ref 0.0–0.5)
Eosinophils Relative: 2 %
HCT: 45.5 % (ref 36.0–46.0)
Hemoglobin: 14.6 g/dL (ref 12.0–15.0)
Immature Granulocytes: 0 %
Lymphocytes Relative: 36 %
Lymphs Abs: 2.1 10*3/uL (ref 0.7–4.0)
MCH: 25.6 pg — ABNORMAL LOW (ref 26.0–34.0)
MCHC: 32.1 g/dL (ref 30.0–36.0)
MCV: 79.8 fL — ABNORMAL LOW (ref 80.0–100.0)
Monocytes Absolute: 0.5 10*3/uL (ref 0.1–1.0)
Monocytes Relative: 8 %
Neutro Abs: 3 10*3/uL (ref 1.7–7.7)
Neutrophils Relative %: 53 %
Platelets: 230 10*3/uL (ref 150–400)
RBC: 5.7 MIL/uL — ABNORMAL HIGH (ref 3.87–5.11)
RDW: 14.4 % (ref 11.5–15.5)
WBC: 5.7 10*3/uL (ref 4.0–10.5)
nRBC: 0 % (ref 0.0–0.2)

## 2022-07-04 LAB — BASIC METABOLIC PANEL
Anion gap: 9 (ref 5–15)
BUN: 16 mg/dL (ref 8–23)
CO2: 27 mmol/L (ref 22–32)
Calcium: 10.8 mg/dL — ABNORMAL HIGH (ref 8.9–10.3)
Chloride: 99 mmol/L (ref 98–111)
Creatinine, Ser: 0.8 mg/dL (ref 0.44–1.00)
GFR, Estimated: >60 mL/min (ref >60)
Glucose, Bld: 118 mg/dL — ABNORMAL HIGH (ref 70–99)
Potassium: 3.8 mmol/L (ref 3.5–5.1)
Sodium: 135 mmol/L (ref 135–145)

## 2022-07-04 LAB — TROPONIN I (HIGH SENSITIVITY): Troponin I (High Sensitivity): 13 ng/L (ref <18)

## 2022-07-04 NOTE — ED Provider Triage Note (Signed)
Emergency Medicine Provider Triage Evaluation Note  Jennifer Grant, a 71 y.o. female  was evaluated in triage.  Pt complains of pain to the right leg in the groin.  Patient reports history of A-fib and some leg tenderness and pain at the groin.  Review of Systems  Positive: Right groin pain  Negative: CP. SOB  Physical Exam  BP (!) 135/108   Pulse 87   Temp 97.9 F (36.6 C)   Resp 16   Wt 101.6 kg   LMP  (LMP Unknown)   SpO2 96%   BMI 39.68 kg/m  Gen:   Awake, no distress  NAD Resp:  Normal effort CTA MSK:   Moves extremities without difficulty  CVS:  RRR  Medical Decision Making  Medically screening exam initiated at 5:06 PM.  Appropriate orders placed.  Jennifer Grant was informed that the remainder of the evaluation will be completed by another provider, this initial triage assessment does not replace that evaluation, and the importance of remaining in the ED until their evaluation is complete.  Patient to the ED for evaluation of right groin pain.  Patient reports fullness and pressure in the area yesterday.   Jennifer Needles, PA-C 07/04/22 1710

## 2022-07-04 NOTE — Discharge Instructions (Signed)
Your pain is likely due to a muscle strain or spasm, or possibly a nerve pain in one of the nerves coming from the spine.  There are no signs of a blood clot or infection.  You can take ibuprofen if needed for the pain.  Follow-up with your ocular doctor.  Return to the ER for new, worsening, or persistent severe pain, any weakness or numbness in the leg, swelling to the area, or any other new or worsening symptoms that concern you.

## 2022-07-04 NOTE — ED Triage Notes (Signed)
Pt presents via EMS c/o pain to right leg. Reports pain at where stents are on leg. 12 lead + a fib RVR. 140 per EMS. Hx a fib. Took meds this am. EMS report HTN, hx of same. Denies chest pain.

## 2022-07-04 NOTE — ED Provider Notes (Signed)
[facility] Provider Note    Event Date/Time   First MD Initiated Contact with Patient 07/04/22 2030     (approximate)   History   Leg Pain   HPI  Jennifer Grant is a 71 y.o. female with a history of CAD, diastolic dysfunction, hyperlipidemia, OSA, and paroxysmal atrial fibrillation atrial fibrillation who presents with right proximal leg pain, acute onset earlier today, now resolved.  The patient reports a sharp pain that briefly felt like an explosion in the area.  It has now subsided.  The pain did not radiate down the leg.  She reports no associated weakness or numbness.  She denies any prior history of this pain.  She states the pain is in the same area where the catheters were inserted when she had cardiac stents placed previously although the last stent was more than 5 years ago.  She was seen here a few weeks ago for tingling on the right side but that felt different.  The patient denies any swelling to the leg.  She has no rash or skin lesions.  She denies any fever or chills.  She has normal range of motion to the hip and knee and denies any acute injury.  I reviewed past medical records.  Per the discharge summary from 10/25 of this year she was admitted at that time for a burning sensation to the right side of her face, chest, arm, and leg.  She was ruled out for stroke.  The symptoms were thought to be jacksonian march.   Physical Exam   Triage Vital Signs: ED Triage Vitals [07/04/22 1626]  Enc Vitals Group     BP (!) 135/108     Pulse Rate 87     Resp 16     Temp 97.9 F (36.6 C)     Temp src      SpO2 96 %     Weight 224 lb (101.6 kg)     Height      Head Circumference      Peak Flow      Pain Score 0     Pain Loc      Pain Edu?      Excl. in GC?     Most recent vital signs: Vitals:   07/04/22 2005 07/04/22 2045  BP: (!) 140/95 (!) 134/96  Pulse: 78 67  Resp: 15 16  Temp:  98.3 F (36.8 C)  SpO2: 98% 100%      General: Awake, no distress.  CV:  Good peripheral perfusion.  Resp:  Normal effort.  Abd:  No distention.  Other:  Right medial proximal thigh soft and nontender with no erythema, induration, or abnormal warmth.  No inguinal lymphadenopathy.  No significant swelling.  Full range of motion at the hip and knee.  2+ DP pulse, normal cap refill, motor and sensory intact distally.   ED Results / Procedures / Treatments   Labs (all labs ordered are listed, but only abnormal results are displayed) Labs Reviewed  CBC WITH DIFFERENTIAL/PLATELET - Abnormal; Notable for the following components:      Result Value   RBC 5.70 (*)    MCV 79.8 (*)    MCH 25.6 (*)    All other components within normal limits  BASIC METABOLIC PANEL - Abnormal; Notable for the following components:   Glucose, Bld 118 (*)    Calcium 10.8 (*)    All other components within normal limits  TROPONIN I (HIGH  SENSITIVITY)     EKG  ED ECG REPORT I, Arta Silence, the attending physician, personally viewed and interpreted this ECG.  Date: 07/04/2022 EKG Time: 1637 Rate: 91 Rhythm: normal sinus rhythm QRS Axis: normal Intervals: normal ST/T Wave abnormalities: normal Narrative Interpretation: no evidence of acute ischemia    RADIOLOGY  Chest x-ray: I independently viewed and interpreted the images; there is no focal consolidation or edema  US venous RLE: No acute DVT  PROCEDURES:  Critical Care performed: No  Procedures   MEDICATIONS ORDERED IN ED: Medications - No data to display   IMPRESSION / MDM / [place] / ED COURSE  I reviewed the triage vital signs and the nursing notes.  71 year old female with PMH as noted above presents with pain to her right proximal thigh which is now resolved.  Physical exam reveals no acute findings.  Differential diagnosis includes, but is not limited to, muscle strain or spasm, sciatica or other radicular pain, less likely DVT.  There is  no evidence of other vascular cause.  There is no evidence of cellulitis or abscess and there is no lymphadenopathy on exam.  DVT study is negative.  Lab work-up is unremarkable with no leukocytosis or significant electrolyte abnormalities.  Based on the examination and this work-up there is no indication for further ED treatment.  The symptoms have resolved.  I counseled the patient on the results of the work-up and the likely causes of her pain.  She is reassured.  At this time she is stable for discharge home.  Return precautions provided, and she expressed understanding.  Patient's presentation is most consistent with acute complicated illness / injury requiring diagnostic workup.     FINAL CLINICAL IMPRESSION(S) / ED DIAGNOSES   Final diagnoses:  Right thigh pain     Rx / DC Orders   ED Discharge Orders     None        Note:  This document was prepared using Dragon voice recognition software and may include unintentional dictation errors.    Arta Silence, MD 07/04/22 2209

## 2022-07-06 ENCOUNTER — Telehealth: Payer: Self-pay

## 2022-07-06 NOTE — Telephone Encounter (Signed)
Received transferred call from [facility] to speak with patient requesting a referral to an eye doctor. Patient states that both of her eyes are hurting, red, and hard to open. Patient says that she believes both eyes are infected although it was only the left eye that had something in it. Patient gave phone to her daughter Alric Ran to reiterate the patients eye concerns. Patient's daughter stated that she has made an eye appointment for her mother today at 10:15 but is not sure that this is the right thing to do. Patient's daughter would like a call back and for the provider to advise if taking her mother to the eye doctor is what their next steps should be. Please call to advise.

## 2022-07-06 NOTE — Telephone Encounter (Signed)
Spoke with Irena Reichmann, CMA and she advised that patient should go to her eye appointment as scheduled.  Called patient and she has agreed to go to appointment.

## 2022-07-07 ENCOUNTER — Telehealth: Payer: Self-pay | Admitting: *Deleted

## 2022-07-07 NOTE — Patient Outreach (Signed)
  Care Coordination [facility] Note Transition Care Management Follow-up Telephone Call Date of discharge and from where: [facility] [phone] How have you been since you were released from the hospital? I am feeling better. My eyes are better too.  Any questions or concerns? No  Items Reviewed: Did the pt receive and understand the discharge instructions provided? Yes  Medications obtained and verified? Yes  Other? No  Any new allergies since your discharge? No  Dietary orders reviewed? No Do you have support at home? Yes   Home Care and Equipment/Supplies: Were home health services ordered? not applicable If so, what is the name of the agency? N/a  Has the agency set up a time to come to the patient's home? not applicable Were any new equipment or medical supplies ordered?  No What is the name of the medical supply agency? N/a Were you able to get the supplies/equipment? no Do you have any questions related to the use of the equipment or supplies? No  Functional Questionnaire: (I = Independent and D = Dependent) ADLs: I  Bathing/Dressing- I  Meal Prep- I  Eating- I  Maintaining continence- I  Transferring/Ambulation- I  Managing Meds- I  Follow up appointments reviewed:  PCP Hospital f/u appt confirmed? No  Follow up with Aura Dials NP as needed Specialist Hospital f/u appt confirmed? No  . Are transportation arrangements needed? No  If their condition worsens, is the pt aware to call PCP or go to the Emergency Dept.? Yes Was the patient provided with contact information for the PCP's office or ED? Yes Was to pt encouraged to call back with questions or concerns? Yes  SDOH assessments and interventions completed:   Yes SDOH Screenings   Food Insecurity: No Food Insecurity (07/07/2022)  Housing: Low Risk  (07/07/2022)  Transportation Needs: No Transportation Needs (07/07/2022)  Utilities: Not At Risk (05/04/2022)  Alcohol Screen: Low Risk  (05/04/2022)  Depression (PHQ2-9): Low  Risk  (05/04/2022)  Financial Resource Strain: Medium Risk (05/04/2022)  Physical Activity: Inactive (05/04/2022)  Social Connections: Moderately Isolated (05/04/2022)  Stress: No Stress Concern Present (05/04/2022)  Tobacco Use: Low Risk  (07/04/2022)    Care Coordination Interventions Activated:  Yes   Goals Addressed             This Visit's Progress    Develop Plan of Care for Management of Hypertension           Care Coordination Interventions:  Referred for Care Coordination Services:  RN Care Coordinator  [place]  [id number] 10:00 AM  Encounter Outcome:  Pt. Visit Completed    Gean Maidens BSN RN [facility] Care Management [phone]

## 2022-07-21 ENCOUNTER — Ambulatory Visit
Admission: EM | Admit: 2022-07-21 | Discharge: 2022-07-21 | Disposition: A | Discharge status: Home / Self Care | Payer: Medicare Other | Attending: Family Medicine | Admitting: Family Medicine

## 2022-07-21 ENCOUNTER — Ambulatory Visit: Payer: Self-pay | Admitting: *Deleted

## 2022-07-21 DIAGNOSIS — R079 Chest pain, unspecified: Secondary | ICD-10-CM | POA: Diagnosis not present

## 2022-07-21 DIAGNOSIS — R002 Palpitations: Secondary | ICD-10-CM | POA: Diagnosis not present

## 2022-07-21 NOTE — Discharge Instructions (Addendum)
EKG showed 1 premature beat on it and had a heart rate of 79  If the symptoms become more persistent or if you have associated chest pain or shortness of breath or dizziness, please proceed to the emergency room  Please check your blood pressure once or twice a day for a few days  Please follow-up with your cardiology clinic

## 2022-07-21 NOTE — Telephone Encounter (Signed)
  Chief Complaint: heart "flutter"  Symptoms: heart is fluttering- only lasts seconds- but has happened twice today Frequency: Noticed change since Monday Pertinent Negatives: Patient denies chest pain Disposition: [] ED /[x] Urgent Care (no appt availability in office) / [] Appointment(In office/virtual)/ []  [facility]/ [] Home Care/ [] Refused Recommended Disposition /[] [place] [facility]/ []  Follow-up with PCP Additional Notes: No open appointment- advised UC for evaluation given patient's recent hx. Patient does not want to go to [facility] ED.

## 2022-07-21 NOTE — Telephone Encounter (Signed)
Noted, agree with need for UC visit

## 2022-07-21 NOTE — ED Triage Notes (Signed)
Patient to Urgent Care with complaints of heart palpations- describes feeling like a butterfly in her chest. Reports sensation is intermittent, when it resolves she feels some squeezing pain on the left side of her chest, non-radiating. This lasts for approx 3-5 seconds. Denies any SHOB.   Symptoms started Monday.  Reports hx of cardiac issues (multiple stent placements).

## 2022-07-21 NOTE — Telephone Encounter (Signed)
Summary: heart fluttering   Patient called in says has fluttering in heart at times, and it concerns her.         Reason for Disposition  Age > 60 years  (Exception: Brief heartbeat symptoms that went away and now feels well.)  Answer Assessment - Initial Assessment Questions 1. DESCRIPTION: "Please describe your heart rate or heartbeat that you are having" (e.g., fast/slow, regular/irregular, skipped or extra beats, "palpitations")     "Flutter" like a butterfly 2. ONSET: "When did it start?" (Minutes, hours or days)      Since Monday 3. DURATION: "How long does it last" (e.g., seconds, minutes, hours)     3-4 seconds- twice today 4. PATTERN "Does it come and go, or has it been constant since it started?"  "Does it get worse with exertion?"   "Are you feeling it now?"     Comes and goes, sitting down, no 5. TAP: "Using your hand, can you tap out what you are feeling on a chair or table in front of you, so that I can hear?" (Note: not all patients can do this)       na 6. HEART RATE: "Can you tell me your heart rate?" "How many beats in 15 seconds?"  (Note: not all patients can do this)       Does not check 7. RECURRENT SYMPTOM: "Have you ever had this before?" If Yes, ask: "When was the last time?" and "What happened that time?"      Yes- last year- heart monitor last year- prescribed medication  8. CAUSE: "What do you think is causing the palpitations?"     unsure 9. CARDIAC HISTORY: "Do you have any history of heart disease?" (e.g., heart attack, angina, bypass surgery, angioplasty, arrhythmia)      TIA, heart disease  10. OTHER SYMPTOMS: "Do you have any other symptoms?" (e.g., dizziness, chest pain, sweating, difficulty breathing)       no  Protocols used: Heart Rate and Heartbeat Questions-A-AH

## 2022-07-21 NOTE — ED Provider Notes (Signed)
Renaldo Fiddler    CSN: [id number] Arrival date & time: 07/21/22  1523      History   Chief Complaint Chief Complaint  Patient presents with   Palpitations    HPI Jennifer Grant is a 71 y.o. female.    Palpitations  Here for some palpitations that have been going on for about 2 days.  For the most part they are very brief and a fluttering in her chest.  She does not get dizzy or have any chest pain with that she has not had any shortness of breath.  No syncope  She has had some palpitations 2 or 3 times in the past also.  She does have a history of heart disease.  She has a cardiologist in the area. Past Medical History:  Diagnosis Date   CAD (coronary artery disease)    a. 2004 s/p MI w/ PCI/stenting to the LCX, OM1, OM2, RCA, RPDA; b. 05/2015 MI/PCI: 100 OM1 (2.75x20 & 3.0x8 Promus Premier DES'), PTCA OM2; c. 07/2015 Cath/Staged PCI: LM 60, LAD 75d, LCX 22m ISR, OM1 ok, OM2 25 ISR, RCA 50p, 21m/d, 85d (3.0x28 Promus Premier DES).   COVID 2 months ago   Diastolic dysfunction    a. 05/2015 Echo: EF 50-55%, severe lateral HK. Gr1 DD, mildly dil LA. Mildly reduced RV fxn.   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    "got booster shot a few times"   GERD (gastroesophageal reflux disease)    Hyperlipidemia    Hypertensive heart disease    Menopausal state    Morbid obesity (HCC)    Noncompliance    Sleep apnea     Patient Active Problem List   Diagnosis Date Noted   Chest pain 07/21/2022   TIA (transient ischemic attack) 06/22/2022   GERD without esophagitis 03/25/2022   SVT (supraventricular tachycardia) 12/04/2021   Chronic right shoulder pain 10/05/2021   Aortic atherosclerosis (HCC) 06/06/2021   Neck pain 05/15/2021   Chronic pain of right knee 05/15/2021   Personal history of colonic polyps    Chronic bilateral low back pain with right-sided sciatica 10/03/2020   Vitamin D deficiency 09/05/2019   Elevated hemoglobin A1c 09/02/2019   Morbid obesity (HCC) 08/09/2018    Advanced care planning/counseling discussion 12/23/2016   Noncompliance with medication regimen 10/07/2016   Chronic diastolic heart failure (HCC) 06/19/2015   Old MI (myocardial infarction) 06/08/2015   Essential hypertension 06/08/2015   Hyperlipidemia 06/08/2015   Atherosclerotic heart disease of native coronary artery with other forms of angina pectoris (HCC) 06/08/2015   Cystocele without uterine prolapse 03/14/2015    Past Surgical History:  Procedure Laterality Date   CARDIAC CATHETERIZATION N/A 06/08/2015   Procedure: Left Heart Cath and Coronary Angiography;  Surgeon: Lyn Records, MD;  Location: [facility] INVASIVE CV LAB;  Service: Cardiovascular;  Laterality: N/A;   CARDIAC CATHETERIZATION N/A 06/08/2015   Procedure: Coronary Stent Intervention;  Surgeon: Lyn Records, MD;  Location: [facility] INVASIVE CV LAB;  Service: Cardiovascular;  Laterality: N/A;   CARDIAC CATHETERIZATION N/A 07/16/2015   Procedure: Coronary Stent Intervention;  Surgeon: Lyn Records, MD;  Location: [facility] INVASIVE CV LAB;  Service: Cardiovascular;  Laterality: N/A;   COLONOSCOPY N/A 02/17/2021   Procedure: COLONOSCOPY;  Surgeon: Midge Minium, MD;  Location: [facility] ENDOSCOPY;  Service: Endoscopy;  Laterality: N/A;   CORONARY ANGIOPLASTY WITH STENT PLACEMENT     "6 stents before 05/2015"   KNEE ARTHROSCOPY Left 1990's   TUBAL LIGATION  ~ 1978  OB History     Gravida  5   Para  4   Term  4   Preterm      AB  1   Living  4      SAB  1   IAB      Ectopic      Multiple      Live Births  4            Home Medications    Prior to Admission medications   Medication Sig Start Date End Date Taking? Authorizing Provider  amLODipine (NORVASC) 10 MG tablet Take 1 tablet (10 mg total) by mouth daily. 06/07/22   Aura Dialsannady, Jolene T, NP  aspirin EC 81 MG tablet Take 81 mg by mouth daily.    [provider]  cholecalciferol (VITAMIN D3) 25 MCG (1000 UNIT) tablet Take 1,000 Units by mouth daily.     [provider]  clopidogrel (PLAVIX) 75 MG tablet Take 1 tablet by mouth once daily 06/14/22   Cannady, Corrie DandyJolene T, NP  diclofenac Sodium (VOLTAREN) 1 % GEL Apply 2 g topically 4 (four) times daily. Apply to right knee 04/28/21   McElwee, Lauren A, NP  famotidine (PEPCID) 20 MG tablet Take 1 tablet (20 mg total) by mouth 2 (two) times daily. 03/25/22   Cannady, Corrie DandyJolene T, NP  hydrochlorothiazide (HYDRODIURIL) 25 MG tablet Take 1 tablet (25 mg total) by mouth daily. 06/07/22   Cannady, Corrie DandyJolene T, NP  isosorbide mononitrate (IMDUR) 60 MG 24 hr tablet Take 1 tablet (60 mg total) by mouth daily. 06/07/22   Cannady, Corrie DandyJolene T, NP  lidocaine (LIDODERM) 5 % Place 1 patch onto the skin daily. Remove & Discard patch within 12 hours or as directed by MD 06/07/22   Aura Dialsannady, Jolene T, NP  losartan (COZAAR) 100 MG tablet Take 1 tablet (100 mg total) by mouth daily. 06/07/22   Cannady, Corrie DandyJolene T, NP  nitroGLYCERIN (NITROSTAT) 0.4 MG SL tablet DISSOLVE ONE TABLET UNDER THE TONGUE EVERY 5 MINUTES AS NEEDED FOR CHEST PAIN.  DO NOT EXCEED A TOTAL OF 3 DOSES IN 15 MINUTES 06/14/22   Cannady, Jolene T, NP  rosuvastatin (CRESTOR) 40 MG tablet Take 1 tablet (40 mg total) by mouth daily. 06/07/22   Cannady, Corrie DandyJolene T, NP  spironolactone (ALDACTONE) 25 MG tablet Take 1 tablet (25 mg total) by mouth every other day. 06/07/22   Cannady, Dorie RankJolene T, NP  UNABLE TO FIND Neo-Cell    [provider]    Family History Family History  Problem Relation Age of Onset   Healthy Mother    Prostate cancer Father    Kidney disease Father    Healthy Brother    Cancer Neg Hx    Diabetes Neg Hx    Heart disease Neg Hx     Social History Social History   Tobacco Use   Smoking status: Never   Smokeless tobacco: Never  Vaping Use   Vaping Use: Never used  Substance Use Topics   Alcohol use: No   Drug use: No     Allergies   Brilinta [ticagrelor], Lipitor [atorvastatin], Latex, Penicillins, and Tekturna  [aliskiren]   Review of Systems Review of Systems  Cardiovascular:  Positive for palpitations.     Physical Exam Triage Vital Signs ED Triage Vitals [07/21/22 1600]  Enc Vitals Group     BP (!) 168/98     Pulse Rate 86     Resp 18     Temp  98.2 F (36.8 C)     Temp src      SpO2 96 %     Weight      Height      Head Circumference      Peak Flow      Pain Score      Pain Loc      Pain Edu?      Excl. in GC?    No data found.  Updated Vital Signs BP (!) 168/98   Pulse 86   Temp 98.2 F (36.8 C)   Resp 18   LMP  (LMP Unknown)   SpO2 96%   Visual Acuity Right Eye Distance:   Left Eye Distance:   Bilateral Distance:    Right Eye Near:   Left Eye Near:    Bilateral Near:     Physical Exam Vitals reviewed.  Constitutional:      General: She is not in acute distress.    Appearance: She is not ill-appearing, toxic-appearing or diaphoretic.  Cardiovascular:     Rate and Rhythm: Normal rate and regular rhythm.     Heart sounds: No murmur heard. Pulmonary:     Effort: Pulmonary effort is normal.     Breath sounds: Normal breath sounds.  Neurological:     General: No focal deficit present.     Mental Status: She is alert and oriented to person, place, and time.  Psychiatric:        Behavior: Behavior normal.      UC Treatments / Results  Labs (all labs ordered are listed, but only abnormal results are displayed) Labs Reviewed - No data to display  EKG   Radiology No results found.  Procedures Procedures (including critical care time)  Medications Ordered in UC Medications - No data to display  Initial Impression / Assessment and Plan / UC Course  I have reviewed the triage vital signs and the nursing notes.  Pertinent labs & imaging results that were available during my care of the patient were reviewed by me and considered in my medical decision making (see chart for details).        Her EKG shows normal sinus rhythm with 1 PVC.   Heart rate at the time of the EKG was 79.  There is a nonspecific T wave abnormality, but there is no ST segment elevation or depression.  Her blood pressure is mildly elevated but otherwise her vital signs are reassuring.  Heart rate at triage was 86.  I discussed with her possible reasons for the palpitations, and have asked her to proceed to the emergency room if she starts having any associated symptoms with the fluttering, such as shortness of breath or chest pain or dizziness.  Also she is to go to the emergency room if it becomes more persistent   She will check her blood pressure at home some Final Clinical Impressions(s) / UC Diagnoses   Final diagnoses:  Palpitations     Discharge Instructions      EKG showed 1 premature beat on it and had a heart rate of 79  If the symptoms become more persistent or if you have associated chest pain or shortness of breath or dizziness, please proceed to the emergency room  Please check your blood pressure once or twice a day for a few days  Please follow-up with your cardiology clinic     ED Prescriptions   None    PDMP not reviewed this encounter.   Wei Poplaski,  Janace Aris, MD 07/21/22 [phone]

## 2022-07-28 ENCOUNTER — Ambulatory Visit: Payer: Self-pay | Admitting: *Deleted

## 2022-07-28 ENCOUNTER — Encounter: Payer: Self-pay | Admitting: *Deleted

## 2022-07-28 NOTE — Patient Outreach (Addendum)
  Care Coordination   Initial Visit Note   07/28/2022 Name: Jennifer Grant MRN: [id number] DOB: 03/15/1951  Jennifer Grant is a 71 y.o. year old female who sees [place], Corrie Dandy T, NP for primary care. I spoke with  [name] by phone today.  What matters to the patients health and wellness today?  Urgent care visit on 11/22 for palpitations.  Feeling "much better."    Goals Addressed             This Visit's Progress    Management of Hypertension       Care Coordination Interventions: Evaluation of current treatment plan related to hypertension self management and patient's adherence to plan as established by provider Provided education to patient re: stroke prevention, s/s of heart attack and stroke Reviewed medications with patient and discussed importance of compliance Discussed plans with patient for ongoing care management follow up and provided patient with direct contact information for care management team Reviewed scheduled/upcoming provider appointments including:  Discussed complications of poorly controlled blood pressure such as heart disease, stroke, circulatory complications, vision complications, kidney impairment, sexual dysfunction Assessed social determinant of health barriers Reviewed visit at Urgent care on 11/22 for heart palpitations, denies any since.  Advised per chart that she should follow up with cardiology.  Next appointment scheduled for 3/4, she declines to schedule before then, stating she does not like going to doctors.  State she will go if she feels palpitations again Next PCP visit scheduled for 4/9, uses Medicaid transportation as well as family Report daughter helping to monitor blood pressure, last readings were 120s/70.  Advised to record readings and share with provider.  Encouraged to monitor daily. Discussed food difficulty, provided with local food pantry/resources in her area. Report having white spots on her brain and told she  needed to see neurologist.  Has appointment scheduled with [facility] clinic today but she canceled it. Would like to see neurology in [place].  Offered to help with new referral, she declines, stating she wanted to wait until next year.         SDOH assessments and interventions completed:  Yes  SDOH Interventions Today    Flowsheet Row Most Recent Value  SDOH Interventions   Food Insecurity Interventions Other (Comment)  [Provided local food resources]  Housing Interventions Intervention Not Indicated  Transportation Interventions Intervention Not Indicated  Utilities Interventions Intervention Not Indicated        Care Coordination Interventions:  Yes, provided   Follow up plan: Follow up call scheduled for 1/3    Encounter Outcome:  Pt. Visit Completed   Kemper Durie, RN, MSN, [facility] Care Management Care Management Coordinator [phone]

## 2022-07-28 NOTE — Patient Instructions (Signed)
Visit Information  Thank you for taking time to visit with me today. Please don't hesitate to contact me if I can be of assistance to you before our next scheduled telephone appointment.  Following are the goals we discussed today:  Monitor blood pressure daily. Call cardiology office to schedule urgent care follow up Call PCP office to get new referral to neurology in [facility]  Our next appointment is by telephone on 1/3  Please call the care guide team at [phone] if you need to cancel or reschedule your appointment.   Please call the Suicide and Crisis Lifeline: 988 call the [place] National Suicide Prevention Lifeline: 314-482-3275 or TTY: 346-181-1910 TTY (956)844-0415) to talk to a trained counselor call 1-800-273-TALK (toll free, 24 hour hotline) call 911 if you are experiencing a Mental Health or Behavioral Health Crisis or need someone to talk to.  Patient verbalizes understanding of instructions and care plan provided today and agrees to view in MyChart. Active MyChart status and patient understanding of how to access instructions and care plan via MyChart confirmed with patient.     The patient has been provided with contact information for the care management team and has been advised to call with any health related questions or concerns.   Kemper Durie, RN, MSN, [facility] Care Management Care Management Coordinator [phone]

## 2022-08-03 ENCOUNTER — Ambulatory Visit: Payer: Self-pay

## 2022-08-03 NOTE — Telephone Encounter (Signed)
Pt called asking if PCP or someone from [facility] can call the cardiologists office to make appt earlier than March. Pt denied any palpitation or any other sx such as chest pain, SOB, dizziness. Advised pt to call cardiologist to try and get earlier appt or ask to be put on wait list. Pt verbalized understanding.  Attempted to call pt back to schedule hospital f/u , but no answer. Reason for Disposition  General information question, no triage required and triager able to answer question  Answer Assessment - Initial Assessment Questions 1. REASON FOR CALL or QUESTION: "What is your reason for calling today?" or "How can I best help you?" or "What question do you have that I can help answer?"     Pt called asking if PCP or someone from [facility] can call the cardiologists office to make appt earlier than March. Pt denied any palpitation or any other sx such as chest pain, SOB, dizziness.  Protocols used: Information Only Call - No Triage-A-AH

## 2022-08-04 NOTE — Telephone Encounter (Signed)
Spoke with patient and she does not need a hospital follow up as she went to urgent care and was not admitted. She states she needs a cardiology appointment and they have her on the waiting list. She would like help with getting an earlier appointment with cardiology

## 2022-08-04 NOTE — Telephone Encounter (Signed)
Called and spoke with April at [facility] in [place], Made appt for patient on Friday 08/06/22 at 10:55. Patient was called and verbalized understanding and said that she would be there at this appt.

## 2022-08-05 NOTE — Progress Notes (Signed)
Cardiology Office Note:    Date:  08/06/2022   ID:  Jennifer Grant, DOB 03/24/1951, MRN [id number]  PCP:  Venita Lick, NP  [facility] Cardiologist:  Sinclair Grooms, MD    Referring MD: Venita Lick, NP   Chief Complaint:  Palpitations    Patient Profile: Coronary artery disease  MI in 2004 s/p stent to LCx, OM1, OM2, RCA, RPDA Lat STEMI 05/2015 s/p POBA to OM2; 2.75 x 20 mm and 3 x 8 mm DES to OM1 LHC 06/08/2015: LAD mid and distal 50 and 75 / LCx mid stent patent /OM2 99 ISR, OM1 100 ISR /distal RCA and PDA 70-90 ISR Staged PCI 07/16/15: s/p 3 x 28 mm DES to mid distal RCA Myoview 02/17/2022 apical, apical lateral and anterolateral scar, no ischemia, EF 47, intermediate risk (HFpEF) heart failure with preserved ejection fraction  TTE 06/22/22: EF > 55, mild LVH, G1 DD, normal RVSF, moderate MAC, trivial AI, mild dilation 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 female with the above problem list.  She was last seen by Dr. Tamala Julian 04/29/22.  She was seen in urgent care 07/21/2022 with palpitations.  1 PVC was noted on EKG.  She returns for follow-up.  She is here with her daughter.  She had palpitations described as flutters for about 5 days prior to going to urgent care.  She felt some discomfort with palpitations.  Otherwise, she has not had exertional chest pain.  She does note some exertional shortness of breath which is overall mild.  She has not had orthopnea, PND or leg edema.  She has not had syncope.  I did review her hospitalization in October.  She was admitted with paresthesias on the right side of her face and torso.   Workup was unremarkable.  It appears that she was supposed to have a follow-up cardiac monitor.  This was ordered but never performed.  Her echocardiogram during that admission demonstrated normal ejection fraction, mild diastolic dysfunction and mildly dilated ascending aorta at 42 mm.  EKG: NSR, HR 72, leftward axis, nonspecific ST-T wave changes, QTc 424    Reviewed and updated this encounter:  Tobacco  Allergies  Meds  Problems  Med Hx  Surg Hx  Fam Hx     Review of Systems  Gastrointestinal:  Negative for hematochezia and melena.  Genitourinary:  Negative for hematuria.   Labs/Other Test Reviewed:   Recent Labs: 12/04/2021: TSH 2.810 06/07/2022: ALT 15 07/04/2022: BUN 16; Creatinine, Ser 0.80; Hemoglobin 14.6; Platelets 230; Potassium 3.8; Sodium 135   Recent Lipid Panel Recent Labs    06/23/22 0504  CHOL 105  TRIG 65  HDL 28*  VLDL 13  LDLCALC 64    Risk Assessment/Calculations/Metrics:             Physical Exam:    VS:  BP 124/78   Pulse 72   Ht _0  (1.6 m)   Wt 230 lb 6.4 oz (104.5 kg)   LMP  (LMP Unknown)   SpO2 93%   BMI 40.81 kg/m     Wt Readings from Last 3 Encounters:  08/06/22 230  lb 6.4 oz (104.5 kg)  07/04/22 224 lb (101.6 kg)  06/28/22 224 lb 1.6 oz (101.7 kg)    Constitutional:      Appearance: Healthy appearance. Not in distress.  Neck:     Vascular: JVD normal.  Pulmonary:     Effort: Pulmonary effort is normal.     Breath sounds: No wheezing. No rales.  Cardiovascular:     Normal rate. Regular rhythm. Normal S1. Normal S2.      Murmurs: There is no murmur.  Edema:    Peripheral edema present.    Pretibial: bilateral trace edema of the pretibial area. Abdominal:     Palpations: There is no hepatomegaly.       ASSESSMENT & PLAN:   Palpitations She likely has been having symptomatic PVCs.  She had less than 2% PVCs on her monitoring.  She had 1 PVC noted on electrocardiogram when she went to the urgent care.  She did have  episodes of SVT on her monitor earlier this year.  She was given metoprolol succinate.  She had significant side effects to this and had to stop.  She was supposed to have a follow-up monitor after presenting to the hospital in October with right-sided paresthesias.  This was never done. Obtain BMET, magnesium, TSH Arrange 14-day ZIO monitor as previously planned Prescription for diltiazem 30 mg daily as needed for palpitations Follow-up 6 months  Coronary artery disease of native artery of native heart with stable angina pectoris ([place]) History of myocardial infarction in 2004 and multiple stents to multiple vessels.  She had a lateral STEMI in October 2016 treated with angioplasty to OM2 and DES to OM1.  She had residual distal vessel disease in the LAD and RCA.  This has been managed medically.  She has not exertional chest pain to suggest angina.  She does have some discomfort with her palpitations.  Myoview in June 2023 was negative for ischemia.  I do not think that she needs further ischemic workup.  Continue current medical regimen which includes amlodipine 10 mg daily, aspirin 81 mg daily, Plavix 75 mg daily, Imdur 60 mg daily, Crestor 40 mg daily. F/u in 6 mos.   SVT (supraventricular tachycardia) She had several episodes of SVT on her monitor in May 2023.  She could not tolerate beta-blocker therapy.  As noted, I have prescribed diltiazem 30 mg daily as needed for palpitations.  Essential hypertension Current medications include amlodipine 10 mg daily, HCTZ 25 mg daily, Imdur 60 mg daily, losartan 100 mg daily, spironolactone 25 mg daily.The patient's blood pressure is controlled on her current regimen.  Continue current therapy.   Hyperlipidemia Management includes Crestor 40 mg daily. LDL optimal on most recent lab work.  Continue current Rx.    (HFpEF) heart failure with preserved ejection fraction ([place]) Echo in October 2023 with EF >55.  NYHA II.  Volume status stable.  Continue  spironolactone 25 mg daily, HCTZ 25 mg daily.  Consider SGLT2 inhibitor in the future if symptoms are difficult to manage.  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  Arrange follow-up echo in 1 year.         Dispo:  Return in about 6 months (around 02/05/2023) for Routine Follow Up, w/ Dr. Johney Frame.   Medication Adjustments/Labs and Tests Ordered: Current medicines are reviewed at length with the patient today.  Concerns regarding medicines are outlined above.  Tests Ordered: Orders Placed This Encounter  Procedures   Basic metabolic panel  Magnesium   TSH   LONG TERM MONITOR (3-14 DAYS)   EKG 12-Lead   ECHOCARDIOGRAM COMPLETE   Medication Changes: Meds ordered this encounter  Medications   DISCONTD: diltiazem (CARDIZEM) 30 MG tablet    Sig: Take 1 tablet (30 mg total) by mouth 4 (four) times daily.    Dispense:  30 tablet    Refill:  3   diltiazem (CARDIZEM) 30 MG tablet    Sig: Take 1 tablet (30 mg total) by mouth as needed.    Dispense:  30 tablet    Refill:  3   Signed, Richardson Dopp, PA-C  08/06/2022 1:09 PM    [address], [place], [place]  [address] Phone: [phone]; Fax: [phone]

## 2022-08-06 ENCOUNTER — Ambulatory Visit: Payer: Medicare Other | Attending: Physician Assistant | Admitting: Physician Assistant

## 2022-08-06 ENCOUNTER — Encounter: Payer: Self-pay | Admitting: Physician Assistant

## 2022-08-06 ENCOUNTER — Ambulatory Visit (INDEPENDENT_AMBULATORY_CARE_PROVIDER_SITE_OTHER): Payer: Medicare Other

## 2022-08-06 VITALS — BP 124/78 | HR 72 | Ht 63.0 in | Wt 230.4 lb

## 2022-08-06 DIAGNOSIS — I25118 Atherosclerotic heart disease of native coronary artery with other forms of angina pectoris: Secondary | ICD-10-CM | POA: Diagnosis not present

## 2022-08-06 DIAGNOSIS — I471 Supraventricular tachycardia, unspecified: Secondary | ICD-10-CM

## 2022-08-06 DIAGNOSIS — I10 Essential (primary) hypertension: Secondary | ICD-10-CM

## 2022-08-06 DIAGNOSIS — R002 Palpitations: Secondary | ICD-10-CM | POA: Insufficient documentation

## 2022-08-06 DIAGNOSIS — I77810 Thoracic aortic ectasia: Secondary | ICD-10-CM | POA: Diagnosis not present

## 2022-08-06 DIAGNOSIS — I5032 Chronic diastolic (congestive) heart failure: Secondary | ICD-10-CM

## 2022-08-06 DIAGNOSIS — E782 Mixed hyperlipidemia: Secondary | ICD-10-CM | POA: Diagnosis not present

## 2022-08-06 MED ORDER — DILTIAZEM HCL 30 MG PO TABS: 30.0000 mg | ORAL_TABLET | Freq: Four times a day (QID) | ORAL | 3 refills | Status: DC

## 2022-08-06 MED ORDER — DILTIAZEM HCL 30 MG PO TABS: 30.0000 mg | ORAL_TABLET | ORAL | 3 refills | Status: DC | PRN

## 2022-08-06 NOTE — Patient Instructions (Addendum)
Medication Instructions:  Your physician has recommended you make the following change in your medication:   Diltiazem 30mg  as needed for palpitations  *If you need a refill on your cardiac medications before your next appointment, please call your pharmacy*   Lab Work: BMET, Mag, TSH  If you have labs (blood work) drawn today and your tests are completely normal, you will receive your results only by: MyChart Message (if you have MyChart) OR A paper copy in the mail If you have any lab test that is abnormal or we need to change your treatment, we will call you to review the results.   Testing/Procedures: ECHO in one year   Follow-Up: At [facility], you and your health needs are our priority.  As part of our continuing mission to provide you with exceptional heart care, we have created designated Provider Care Teams.  These Care Teams include your primary Cardiologist (physician) and Advanced Practice Providers (APPs -  Physician Assistants and Nurse Practitioners) who all work together to provide you with the care you need, when you need it.      Your next appointment:   6 month(s)  The format for your next appointment:   In Person  Provider:   Dr. [facility]   Other Shari Prows- Long Term Monitor Instructions  Your physician has requested you wear a ZIO patch monitor for 14 days.  This is a single patch monitor. [employer] supplies one patch monitor per enrollment. Additional stickers are not available. Please do not apply patch if you will be having a Nuclear Stress Test,  Echocardiogram, Cardiac CT, MRI, or Chest Xray during the period you would be wearing the  monitor. The patch cannot be worn during these tests. You cannot remove and re-apply the  ZIO XT patch monitor.  Your ZIO patch monitor will be mailed 3 day USPS to your address on file. It may take 3-5 days  to receive your monitor after you have been enrolled.  Once you have received your monitor, please  review the enclosed instructions. Your monitor  has already been registered assigning a specific monitor serial # to you.  Billing and Patient Assistance Program Information  We have supplied [employer] with any of your insurance information on file for billing purposes. [employer] offers a sliding scale Patient Assistance Program for patients that do not have  insurance, or whose insurance does not completely cover the cost of the ZIO monitor.  You must apply for the Patient Assistance Program to qualify for this discounted rate.  To apply, please call [employer] at [phone], select option 4, select option 2, ask to apply for  Patient Assistance Program. [phone] will ask your household income, and how many people  are in your household. They will quote your out-of-pocket cost based on that information.  [employer] will also be able to set up a 12-month, interest-free payment plan if needed.  Applying the monitor   Shave hair from upper left chest.  Hold abrader disc by orange tab. Rub abrader in 40 strokes over the upper left chest as  indicated in your monitor instructions.  Clean area with 4 enclosed alcohol pads. Let dry.  Apply patch as indicated in monitor instructions. Patch will be placed under collarbone on left  side of chest with arrow pointing upward.  Rub patch adhesive wings for 2 minutes. Remove white label marked "1". Remove the white  label marked "2". Rub patch adhesive wings for 2 additional minutes.  While looking in  a mirror, press and release button in center of patch. A small green light will  flash 3-4 times. This will be your only indicator that the monitor has been turned on.  Do not shower for the first 24 hours. You may shower after the first 24 hours.  Press the button if you feel a symptom. You will hear a small click. Record Date, Time and  Symptom in the Patient Logbook.  When you are ready to remove the patch, follow instructions on the last 2 pages of Patient   Logbook. Stick patch monitor onto the last page of Patient Logbook.  Place Patient Logbook in the blue and white box. Use locking tab on box and tape box closed  securely. The blue and white box has prepaid postage on it. Please place it in the mailbox as  soon as possible. Your physician should have your test results approximately 7 days after the  monitor has been mailed back to [facility]  Call [facility] Customer Care at [phone] if you have questions regarding  your ZIO XT patch monitor. Call them immediately if you see an orange light blinking on your  monitor.  If your monitor falls off in less than 4 days, contact our Monitor department at [phone].  If your monitor becomes loose or falls off after 4 days call [employer] at [phone] for  suggestions on securing your monitor

## 2022-08-06 NOTE — Assessment & Plan Note (Signed)
Management includes Crestor 40 mg daily. LDL optimal on most recent lab work.  Continue current Rx.

## 2022-08-06 NOTE — Assessment & Plan Note (Signed)
She had several episodes of SVT on her monitor in May 2023.  She could not tolerate beta-blocker therapy.  As noted, I have prescribed diltiazem 30 mg daily as needed for palpitations.

## 2022-08-06 NOTE — Assessment & Plan Note (Signed)
42 mm on echo in 05/2022.  Arrange follow-up echo in 1 year.

## 2022-08-06 NOTE — Assessment & Plan Note (Signed)
She likely has been having symptomatic PVCs.  She had less than 2% PVCs on her monitoring.  She had 1 PVC noted on electrocardiogram when she went to the urgent care.  She did have episodes of SVT on her monitor earlier this year.  She was given metoprolol succinate.  She had significant side effects to this and had to stop.  She was supposed to have a follow-up monitor after presenting to the hospital in October with right-sided paresthesias.  This was never done. Obtain BMET, magnesium, TSH Arrange 14-day ZIO monitor as previously planned Prescription for diltiazem 30 mg daily as needed for palpitations Follow-up 6 months

## 2022-08-06 NOTE — Assessment & Plan Note (Signed)
Current medications include amlodipine 10 mg daily, HCTZ 25 mg daily, Imdur 60 mg daily, losartan 100 mg daily, spironolactone 25 mg daily.The patient's blood pressure is controlled on her current regimen.  Continue current therapy.

## 2022-08-06 NOTE — Progress Notes (Unsigned)
Enrolled patient for a 14 day Zio XT monitor to be mailed to patients home  Dr. Smith to read 

## 2022-08-06 NOTE — Assessment & Plan Note (Signed)
History of myocardial infarction in 2004 and multiple stents to multiple vessels.  She had a lateral STEMI in October 2016 treated with angioplasty to OM2 and DES to OM1.  She had residual distal vessel disease in the LAD and RCA.  This has been managed medically.  She has not exertional chest pain to suggest angina.  She does have some discomfort with her palpitations.  Myoview in June 2023 was negative for ischemia.  I do not think that she needs further ischemic workup.  Continue current medical regimen which includes amlodipine 10 mg daily, aspirin 81 mg daily, Plavix 75 mg daily, Imdur 60 mg daily, Crestor 40 mg daily. F/u in 6 mos.

## 2022-08-06 NOTE — Assessment & Plan Note (Signed)
Echo in October 2023 with EF >55.  NYHA II.  Volume status stable.  Continue spironolactone 25 mg daily, HCTZ 25 mg daily.  Consider SGLT2 inhibitor in the future if symptoms are difficult to manage.

## 2022-08-07 LAB — BASIC METABOLIC PANEL
BUN/Creatinine Ratio: 12 (ref 12–28)
BUN: 10 mg/dL (ref 8–27)
CO2: 20 mmol/L (ref 20–29)
Calcium: 10.4 mg/dL — ABNORMAL HIGH (ref 8.7–10.3)
Chloride: 96 mmol/L (ref 96–106)
Creatinine, Ser: 0.85 mg/dL (ref 0.57–1.00)
Glucose: 94 mg/dL (ref 70–99)
Potassium: 4.3 mmol/L (ref 3.5–5.2)
Sodium: 134 mmol/L (ref 134–144)
eGFR: 72 mL/min/{1.73_m2} (ref >59)

## 2022-08-07 LAB — MAGNESIUM: Magnesium: 2.3 mg/dL (ref 1.6–2.3)

## 2022-08-07 LAB — TSH: TSH: 2.58 u[IU]/mL (ref 0.450–4.500)

## 2022-08-09 DIAGNOSIS — R002 Palpitations: Secondary | ICD-10-CM | POA: Diagnosis not present

## 2022-08-09 NOTE — Progress Notes (Signed)
Pt has been made aware of normal result and verbalized understanding.  jw

## 2022-08-25 ENCOUNTER — Telehealth: Payer: Self-pay | Admitting: *Deleted

## 2022-08-25 ENCOUNTER — Telehealth: Payer: Self-pay | Admitting: Nurse Practitioner

## 2022-08-25 NOTE — Patient Outreach (Signed)
  Care Coordination   08/25/2022 Name: Jennifer Grant MRN: [id number] DOB: 11/14/1950   Care Coordination Outreach Attempts:  Notified that patient wanted call back.  Call placed, no answer, voice message left.  Follow Up Plan:  Additional outreach attempts will be made to offer the patient care coordination information and services.   Encounter Outcome:  No Answer   Care Coordination Interventions:  No, not indicated    Kemper Durie, RN, MSN, [facility] Care Management Care Management Coordinator [phone]

## 2022-08-25 NOTE — Telephone Encounter (Signed)
Copied from CRM [id number]. Topic: General - Other >> Aug 25, 2022  9:45 AM Macon Large wrote: Reason for CRM: Pt requests to speak with [facility]. Pt stated she got the device late and she will be sending it back. Pt stated the device was uncomfortable and it caused a burning feeling. Pt also stated that it caused pain from her shoulder down to her hand. GB#[phone]

## 2022-08-27 DIAGNOSIS — R002 Palpitations: Secondary | ICD-10-CM | POA: Diagnosis not present

## 2022-08-31 DIAGNOSIS — I471 Supraventricular tachycardia, unspecified: Secondary | ICD-10-CM

## 2022-08-31 HISTORY — DX: Supraventricular tachycardia, unspecified: I47.10

## 2022-09-01 ENCOUNTER — Encounter: Payer: Self-pay | Admitting: Physician Assistant

## 2022-09-01 ENCOUNTER — Ambulatory Visit: Payer: Self-pay | Admitting: *Deleted

## 2022-09-01 NOTE — Patient Outreach (Signed)
  Care Coordination   09/01/2022 Name: Jennifer Grant MRN: [id number] DOB: 03-15-1951   Care Coordination Outreach Attempts:  An unsuccessful telephone outreach was attempted for a scheduled appointment today.  Follow Up Plan:  Additional outreach attempts will be made to offer the patient care coordination information and services.   Encounter Outcome:  No Answer   Care Coordination Interventions:  No, not indicated    Valente David, RN, MSN, [facility] Care Management Care Management Coordinator [phone]

## 2022-09-02 ENCOUNTER — Telehealth: Payer: Self-pay | Admitting: *Deleted

## 2022-09-02 MED ORDER — ATENOLOL 25 MG PO TABS: ORAL_TABLET | ORAL | 1 refills | Status: DC

## 2022-09-02 NOTE — Telephone Encounter (Signed)
-----   Message from Liliane Shi, [place] sent at 09/01/2022 11:05 AM EST ----- Monitor shows an episode of Supraventricular Tachycardia that lasted 54 minutes. There was no AFib. There were occasional PVCs (burden 2.7%).  She had side effects to Metoprolol. It would be best to try a different beta-blocker. Atenolol is less likely to cause side effects compared to Metoprolol. PLAN:  - Start Atenolol 12.5 mg once daily x 1 week, then 25 mg once daily (can take at bedtime). - Arrange f/u with Dr. Johney Frame or me in 6-8 weeks (needs EKG done at [facility]) - She can continue to take Diltiazem 30 mg - just as needed for palpitations (not daily). Richardson Dopp, PA-C    09/01/2022 10:53 AM

## 2022-09-04 NOTE — Patient Instructions (Signed)

## 2022-09-06 ENCOUNTER — Encounter: Payer: Self-pay | Admitting: Nurse Practitioner

## 2022-09-06 ENCOUNTER — Ambulatory Visit (INDEPENDENT_AMBULATORY_CARE_PROVIDER_SITE_OTHER): Payer: 59 | Admitting: Nurse Practitioner

## 2022-09-06 VITALS — BP 136/84 | HR 80 | Temp 98.0°F | Ht 62.99 in | Wt 236.0 lb

## 2022-09-06 DIAGNOSIS — I10 Essential (primary) hypertension: Secondary | ICD-10-CM

## 2022-09-06 DIAGNOSIS — I25118 Atherosclerotic heart disease of native coronary artery with other forms of angina pectoris: Secondary | ICD-10-CM | POA: Diagnosis not present

## 2022-09-06 DIAGNOSIS — R21 Rash and other nonspecific skin eruption: Secondary | ICD-10-CM | POA: Diagnosis not present

## 2022-09-06 DIAGNOSIS — I5032 Chronic diastolic (congestive) heart failure: Secondary | ICD-10-CM

## 2022-09-06 DIAGNOSIS — M79601 Pain in right arm: Secondary | ICD-10-CM | POA: Diagnosis not present

## 2022-09-06 DIAGNOSIS — I77810 Thoracic aortic ectasia: Secondary | ICD-10-CM

## 2022-09-06 NOTE — Assessment & Plan Note (Signed)
Chronic, ongoing.  Euvolemic on exam.  Continue current medication regimen and collaboration with cardiology.  Discussed diet choices and to cut back on sodium intake, she eats a higher sodium diet with lots of spicier foods.   - Reminded to call for an overnight weight gain of >2 pounds or a weekly weight weight of >5 pounds - not adding salt to his food and has been reading food labels. Reviewed the importance of keeping daily sodium intake to 2000mg  daily  - No Ibuprofen products

## 2022-09-06 NOTE — Assessment & Plan Note (Signed)
Acute post recent blood draw, suspect discomfort from this.  It is improving.  Continue to monitor closely.

## 2022-09-06 NOTE — Assessment & Plan Note (Signed)
Chronic, ongoing.  BP at goal in office on recheck and on home check this morning.  Is cutting back on sodium.  Does not consistently take medication.  Recommend ensuring medication taken daily + checking BP at home at least a few mornings a week.  Continue collaboration with cardiology and review notes.  Continue current medication regimen at this time, suspect adjustments will need to be made in future.  Labs: next visit.

## 2022-09-06 NOTE — Assessment & Plan Note (Signed)
BMI 41.82 with HTN, OLD MI, HLD. Recommended eating smaller high protein, low fat meals more frequently and exercising 30 mins a day 5 times a week with a goal of 10-15lb weight loss in the next 3 months. Patient voiced their understanding and motivation to adhere to these recommendations.

## 2022-09-06 NOTE — Assessment & Plan Note (Signed)
Acute and improved. No further rash present.  From [place].

## 2022-09-06 NOTE — Assessment & Plan Note (Signed)
Ongoing, continue collaboration with cardiology. Recent noted reviewed.

## 2022-09-06 NOTE — Progress Notes (Signed)
BP 136/84 (BP Location: Left Arm, Patient Position: Sitting, Cuff Size: Large)   Pulse 80   Temp 98 F (36.7 C) (Oral)   Ht 5' 2.99" (1.6 m)   Wt 236 lb (107 kg)   LMP  (LMP Unknown)   SpO2 99%   BMI 41.82 kg/m    Subjective:    Patient ID: Jennifer Grant, female    DOB: 12/19/1950, 72 y.o.   MRN: [id number]  HPI: LANDON TRUAX is a 72 y.o. female  Chief Complaint  Patient presents with   Joint Swelling    Swelling in the right anterior elbow. Only  after she has blood drawn   Chest Pain    Patient states that heart monitor gave her pain and a rash under pads,patient states that she took it off 2 days early.   BP this morning at home 137/70 per patient.  Reports it went up this morning due to stressors upon leaving house, due to someone aggravating her.  RASH Follows with cardiology, last visit 08/06/22 with echo ordered for one year and Zio monitor.  She took off the Zio 2 days early due to rash and soreness.  Zio ordered as they suspect she is having symptomatic PVCs.  Was given Metoprolol in past for SVT, but did not tolerate this.  At recent cardiology visit was given Diltiazem 30 MG to take as needed daily for palpitations., however this was changed to Atenolol after Zio noted SVT for 54 minutes + occasional PVCs.  She has not started as of yet.  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 Followed for atrial fib and CAD. Duration:  weeks  Location:  chest   Itching: no Burning: yes Redness: yes Oozing: no Scaling: no Blisters: some bumps where there Painful: no Fevers: no Change in detergents/soaps/personal care products: no Recent illness: no Recent travel:no History of same: no Context: better Alleviating factors:  Castor oil which helped Treatments attempted: Castor oil which helped Shortness of breath: no  Throat/tongue swelling: no Myalgias/arthralgias: no   ARM PAIN Reports that during recent blood draw  her interior right elbow becomes swollen and sore (noticed this at cardiology purple and swelling), this causes pain in her right shoulder.  She reports the arm pain became worse upon wearing Zio monitor recently, states the monitor causes rash and pain to arm. Duration: weeks Location: forearm and shoulder Mechanism of injury: unknown Onset: sudden Severity: 10/10 at first, but now 2-3/10 Quality:  dull, aching, burning, and throbbing Frequency: intermittent, improving Radiation: no Aggravating factors: movement  Alleviating factors: APAP and rest  Status: stable Treatments attempted: rest, heat, and APAP  Relief with NSAIDs?:  No NSAIDs Taken Swelling:  only with blood draw Redness: no  Warmth: no Trauma: no Chest pain: no  Shortness of breath: no  Fever: no Decreased sensation: no Paresthesias: no Weakness: no   Relevant past medical, surgical, family and social history reviewed and updated as indicated. Interim medical history since our last visit reviewed. Allergies and medications reviewed and updated.  Review of Systems  Constitutional:  Negative for activity change, appetite change, diaphoresis, fatigue and fever.  Respiratory:  Negative for cough, chest tightness and shortness of breath.   Cardiovascular:  Negative for chest pain, palpitations and leg swelling.  Gastrointestinal: Negative.   Endocrine: Negative for polydipsia, polyphagia and polyuria.  Neurological: Negative.   Psychiatric/Behavioral: Negative.      Per HPI unless specifically  indicated above     Objective:    BP 136/84 (BP Location: Left Arm, Patient Position: Sitting, Cuff Size: Large)   Pulse 80   Temp 98 F (36.7 C) (Oral)   Ht 5' 2.99" (1.6 m)   Wt 236 lb (107 kg)   LMP  (LMP Unknown)   SpO2 99%   BMI 41.82 kg/m   Wt Readings from Last 3 Encounters:  09/06/22 236 lb (107 kg)  08/06/22 230 lb 6.4 oz (104.5 kg)  07/04/22 224 lb (101.6 kg)    Physical Exam Vitals and nursing note  reviewed.  Constitutional:      General: She is awake. She is not in acute distress.    Appearance: She is well-developed and well-groomed. She is morbidly obese. She is not ill-appearing.  HENT:     Head: Normocephalic.     Right Ear: Hearing normal.     Left Ear: Hearing normal.  Eyes:     General: Lids are normal.        Right eye: No discharge.        Left eye: No discharge.     Conjunctiva/sclera: Conjunctivae normal.     Pupils: Pupils are equal, round, and reactive to light.  Neck:     Thyroid: No thyromegaly.     Vascular: No carotid bruit.  Cardiovascular:     Rate and Rhythm: Normal rate and regular rhythm.     Heart sounds: Normal heart sounds. No murmur heard.    No gallop.  Pulmonary:     Effort: Pulmonary effort is normal. No accessory muscle usage or respiratory distress.     Breath sounds: Normal breath sounds.  Abdominal:     General: Bowel sounds are normal.     Palpations: Abdomen is soft.     Tenderness: There is no abdominal tenderness.  Musculoskeletal:     Right shoulder: Normal.     Left shoulder: Normal.     Right wrist: Normal.     Left wrist: Normal.     Cervical back: Normal range of motion and neck supple.     Right lower leg: Edema (trace) present.     Left lower leg: Edema (trace) present.  Skin:    General: Skin is warm and dry.  Neurological:     Mental Status: She is alert and oriented to person, place, and time.  Psychiatric:        Attention and Perception: Attention normal.        Mood and Affect: Mood normal.        Speech: Speech normal.        Behavior: Behavior normal. Behavior is cooperative.        Thought Content: Thought content normal.    Results for orders placed or performed in visit on 89/37/34  Basic metabolic panel  Result Value Ref Range   Glucose 94 70 - 99 mg/dL   BUN 10 8 - 27 mg/dL   Creatinine, Ser 0.85 0.57 - 1.00 mg/dL   eGFR 72 >59 mL/min/1.73   BUN/Creatinine Ratio 12 12 - 28   Sodium 134 134 - 144  mmol/L   Potassium 4.3 3.5 - 5.2 mmol/L   Chloride 96 96 - 106 mmol/L   CO2 20 20 - 29 mmol/L   Calcium 10.4 (H) 8.7 - 10.3 mg/dL  Magnesium  Result Value Ref Range   Magnesium 2.3 1.6 - 2.3 mg/dL  TSH  Result Value Ref Range   TSH 2.580 0.450 -  4.500 uIU/mL      Assessment & Plan:   Problem List Items Addressed This Visit       Cardiovascular and Mediastinum   (HFpEF) heart failure with preserved ejection fraction (HCC) - Primary (Chronic)    Chronic, ongoing.  Euvolemic on exam.  Continue current medication regimen and collaboration with cardiology.  Discussed diet choices and to cut back on sodium intake, she eats a higher sodium diet with lots of spicier foods.   - Reminded to call for an overnight weight gain of >2 pounds or a weekly weight weight of >5 pounds - not adding salt to his food and has been reading food labels. Reviewed the importance of keeping daily sodium intake to 2000mg  daily  - No Ibuprofen products      Coronary artery disease of native artery of native heart with stable angina pectoris (HCC) (Chronic)    Chronic, ongoing.  Continue current medication regimen and collaboration with cardiology.  No recent NTG use, will send in refill as needed.      Essential hypertension (Chronic)    Chronic, ongoing.  BP at goal in office on recheck and on home check this morning.  Is cutting back on sodium.  Does not consistently take medication.  Recommend ensuring medication taken daily + checking BP at home at least a few mornings a week.  Continue collaboration with cardiology and review notes.  Continue current medication regimen at this time, suspect adjustments will need to be made in future.  Labs: next visit.      Ascending aorta dilation (HCC)    Ongoing, continue collaboration with cardiology. Recent noted reviewed.        Musculoskeletal and Integument   Rash    Acute and improved. No further rash present.  From Zio.        Other   Morbid obesity  (HCC)    BMI 41.82 with HTN, OLD MI, HLD. Recommended eating smaller high protein, low fat meals more frequently and exercising 30 mins a day 5 times a week with a goal of 10-15lb weight loss in the next 3 months. Patient voiced their understanding and motivation to adhere to these recommendations.       Right arm pain    Acute post recent blood draw, suspect discomfort from this.  It is improving.  Continue to monitor closely.        Follow up plan: Return for as scheduled on April 9th.

## 2022-09-06 NOTE — Assessment & Plan Note (Signed)
Chronic, ongoing.  Continue current medication regimen and collaboration with cardiology.  No recent NTG use, will send in refill as needed.

## 2022-09-08 ENCOUNTER — Telehealth: Payer: Self-pay | Admitting: *Deleted

## 2022-09-08 NOTE — Progress Notes (Signed)
  Care Coordination Note  09/08/2022 Name: Jennifer Grant MRN: [id number] DOB: 08/09/1951  Jennifer Grant is a 72 y.o. year old female who is a primary care patient of Venita Lick, NP and is actively engaged with the care management team. I reached out to [name] by phone today to assist with re-scheduling a follow up visit with the RN Case Manager  Follow up plan: Unsuccessful telephone outreach attempt made. A HIPAA compliant phone message was left for the patient providing contact information and requesting a return call.   Julian Hy, [place] Direct Dial: [phone]

## 2022-09-13 NOTE — Progress Notes (Signed)
  Care Coordination Note  09/13/2022 Name: Jennifer Grant MRN: [id number] DOB: 1951-08-15  Jennifer Grant is a 72 y.o. year old female who is a primary care patient of Venita Lick, NP and is actively engaged with the care management team. I reached out to [name] by phone today to assist with re-scheduling a follow up visit with the RN Case Manager  Follow up plan: We have been unable to make contact with the patient for follow up.    Julian Hy, [place] Direct Dial: [phone]

## 2022-10-14 NOTE — Progress Notes (Deleted)
Cardiology Office Note:    Date:  10/14/2022   ID:  Jennifer Grant, DOB June 08, 1951, MRN [id number]  PCP:  Venita Lick, NP  [facility] Cardiologist:  Sinclair Grooms, MD (Inactive) { Click to update primary MD,subspecialty MD or APP then REFRESH:1}  *** Referring MD: Venita Lick, NP   Patient Profile: Coronary artery disease  MI in 2004 s/p stent to LCx, OM1, OM2, RCA, RPDA Lat STEMI 05/2015 s/p POBA to OM2; 2.75 x 20 mm and 3 x 8 mm DES to OM1 LHC 06/08/2015: LAD mid and distal 50 and 75 / LCx mid stent patent /OM2 99 ISR, OM1 100 ISR /distal RCA and PDA 70-90 ISR Staged PCI 07/16/15: s/p 3 x 28 mm DES to mid distal RCA Myoview 02/17/2022 apical, apical lateral and anterolateral scar, no ischemia, EF 47, intermediate risk (HFpEF) heart failure with preserved ejection fraction  TTE 06/22/22: EF > 55, mild LVH, G1 DD, normal RVSF, moderate MAC, trivial AI, mild dilation of ascending aorta (42 mm), RAP 3 Paroxysmal atrial fibrillation  Occurred in setting of MI >> no anticoag unless recurrence  Supraventricular Tachycardia  ZIO monitor 01/27/2022: NSR, 12 episodes of SVT (longest greater than 16 minutes); [place] 1 Monitor 08/06/2022: SVT >150 bpm lasting 54 minutes; PVC burden 2.7% Hypertension  Hyperlipidemia  MVP  GERD Obesity  OSA     History of Present Illness:   Jennifer Grant is a 72 y.o. female with the above problem list.  She was last seen 08/06/2022 for palpitations.  Zio patch monitor demonstrated an episode of SVT lasting 54 minutes.  She had intolerance to metoprolol in the past.  I placed her on atenolol.  She returns for follow-up of SVT.***  {EKG done?:28833::"EKG: ***"}  Subjective    Reviewed and updated this encounter:***     ROS  Objective   Labs/Other Test Reviewed:   Recent Labs: 06/07/2022: ALT 15 07/04/2022: Hemoglobin 14.6; Platelets 230 08/06/2022: BUN 10; Creatinine, Ser 0.85; Magnesium 2.3; Potassium 4.3; Sodium 134;  TSH 2.580   Recent Lipid Panel Recent Labs    06/23/22 0504  CHOL 105  TRIG 65  HDL 28*  VLDL 13  LDLCALC 64     Risk Assessment/Calculations/Metrics:   {Does this patient have ATRIAL FIBRILLATION?:[id number]}     No BP recorded.  {Refresh Note OR Click here to enter BP  :1}***   Physical Exam:   VS:  LMP  (LMP Unknown)    Wt Readings from Last 3 Encounters:  09/06/22 236 lb (107 kg)  08/06/22 230 lb 6.4 oz (104.5 kg)  07/04/22 224 lb (101.6 kg)    Physical Exam *** Assessment & Plan    ASSESSMENT & PLAN:   No problem-specific Assessment & Plan notes found for this encounter.       {  Palpitations She likely has been having symptomatic PVCs.  She had less than 2% PVCs on her monitoring.  She had 1 PVC noted on electrocardiogram when she went to the urgent care.  She did have episodes of SVT on her monitor earlier this year.  She was given metoprolol succinate.  She had significant side effects to this and had to stop.  She was supposed to have a follow-up monitor after presenting to the hospital in October with right-sided paresthesias.  This was never done. Obtain BMET, magnesium, TSH Arrange 14-day ZIO monitor as previously planned Prescription for diltiazem 30 mg daily as needed for palpitations Follow-up 6  months   Coronary artery disease of native artery of native heart with stable angina pectoris ([place]) History of myocardial infarction in 2004 and multiple stents to multiple vessels.  She had a lateral STEMI in October 2016 treated with angioplasty to OM2 and DES to OM1.  She had residual distal vessel disease in the LAD and RCA.  This has been managed medically.  She has not exertional chest pain to suggest angina.  She does have some discomfort with her palpitations.  Myoview in June 2023 was negative for ischemia.  I do not think that she needs further ischemic workup.  Continue current medical regimen which includes amlodipine 10 mg daily, aspirin 81 mg daily, Plavix 75  mg daily, Imdur 60 mg daily, Crestor 40 mg daily. F/u in 6 mos.    SVT (supraventricular tachycardia) She had several episodes of SVT on her monitor in May 2023.  She could not tolerate beta-blocker therapy.  As noted, I have prescribed diltiazem 30 mg daily as needed for palpitations.   Essential hypertension Current medications include amlodipine 10 mg daily, HCTZ 25 mg daily, Imdur 60 mg daily, losartan 100 mg daily, spironolactone 25 mg daily.The patient's blood pressure is controlled on her current regimen.  Continue current therapy.    Hyperlipidemia Management includes Crestor 40 mg daily. LDL optimal on most recent lab work.  Continue current Rx.     (HFpEF) heart failure with preserved ejection fraction ([place]) Echo in October 2023 with EF >55.  NYHA II.  Volume status stable.  Continue spironolactone 25 mg daily, HCTZ 25 mg daily.  Consider SGLT2 inhibitor in the future if symptoms are difficult to manage.   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  Arrange follow-up echo in 1 year.    :1}   {Are you ordering a CV Procedure (e.g. stress test, cath, DCCV, TEE, etc)?   Press F2        :[id number]   Dispo:  No follow-ups on file.   Signed, Richardson Dopp, PA-C  10/14/2022 10:33 PM    [address], [place], [place]  [address] Phone: [phone]; Fax: [phone]

## 2022-10-15 ENCOUNTER — Ambulatory Visit: Payer: 59 | Attending: Physician Assistant | Admitting: Physician Assistant

## 2022-10-15 DIAGNOSIS — I471 Supraventricular tachycardia, unspecified: Secondary | ICD-10-CM

## 2022-11-01 ENCOUNTER — Ambulatory Visit: Payer: Medicare Other | Admitting: Cardiology

## 2022-11-01 ENCOUNTER — Telehealth: Payer: Self-pay | Admitting: Nurse Practitioner

## 2022-11-01 DIAGNOSIS — I252 Old myocardial infarction: Secondary | ICD-10-CM

## 2022-11-01 DIAGNOSIS — I25118 Atherosclerotic heart disease of native coronary artery with other forms of angina pectoris: Secondary | ICD-10-CM

## 2022-11-01 DIAGNOSIS — I5032 Chronic diastolic (congestive) heart failure: Secondary | ICD-10-CM

## 2022-11-01 DIAGNOSIS — I471 Supraventricular tachycardia, unspecified: Secondary | ICD-10-CM

## 2022-11-01 NOTE — Telephone Encounter (Signed)
Patient called to see if she can have a referral placed to [place] at [place] [place],Dr. Pamelia Hoit, MD office. Patient states that her current cardiologist is retiring. Please advise patient if request can be done, [phone].

## 2022-11-01 NOTE — Addendum Note (Signed)
Addended by: Marnee Guarneri T on: 11/01/2022 11:11 AM   Modules accepted: Orders

## 2022-11-03 ENCOUNTER — Telehealth: Payer: Self-pay | Admitting: Cardiology

## 2022-11-03 NOTE — Telephone Encounter (Signed)
Ok with me 

## 2022-11-03 NOTE — Telephone Encounter (Signed)
Pt requesting provider switch to Dr. Fletcher Anon in the [place] office being that it is closer to her home. Please advise

## 2022-11-10 NOTE — Telephone Encounter (Signed)
Pt has been scheduled.  °

## 2022-11-29 ENCOUNTER — Other Ambulatory Visit: Payer: Self-pay | Admitting: Nurse Practitioner

## 2022-11-29 DIAGNOSIS — I2510 Atherosclerotic heart disease of native coronary artery without angina pectoris: Secondary | ICD-10-CM

## 2022-11-30 NOTE — Telephone Encounter (Signed)
Requested Prescriptions  Pending Prescriptions Disp Refills   nitroGLYCERIN (NITROSTAT) 0.4 MG SL tablet [Pharmacy Med Name: Nitroglycerin 0.4 MG Sublingual Tablet Sublingual] 25 tablet 0    Sig: DISSOLVE ONE TABLET UNDER THE TONGUE EVERY 5 MINUTES AS NEEDED FOR CHEST PAIN.  DO NOT EXCEED A TOTAL OF 3 DOSES IN 15 MINUTES     Cardiovascular:  Nitrates Passed - 11/29/2022  4:46 PM      Passed - Last BP in normal range    BP Readings from Last 1 Encounters:  09/06/22 136/84         Passed - Last Heart Rate in normal range    Pulse Readings from Last 1 Encounters:  09/06/22 80         Passed - Valid encounter within last 12 months    Recent Outpatient Visits           2 months ago Chronic heart failure with preserved ejection fraction ([place])   [place], [place] T, NP   5 months ago Chronic diastolic heart failure ([place])   [place] [employer], [place] T, NP   5 months ago Chronic diastolic heart failure ([place])   [place], Henrine Screws T, NP   8 months ago Chronic diastolic heart failure ([place])   [place], [place] T, NP   12 months ago Chronic diastolic heart failure ([place])   [place], Barbaraann Faster, NP       Future Appointments             In 1 week [place], Barbaraann Faster, NP [place], [facility]   In 1 month Arida, Mertie Clause, MD [place] [facility]

## 2022-12-01 ENCOUNTER — Ambulatory Visit: Payer: Self-pay

## 2022-12-01 NOTE — Telephone Encounter (Signed)
  Chief Complaint: medication assistance  Symptoms: NA Frequency: today Pertinent Negatives: NA Disposition: [] ED /[] Urgent Care (no appt availability in office) / [] Appointment(In office/virtual)/ []  [facility]/ [x] Home Care/ [] Refused Recommended Disposition /[] [facility]/ []  Follow-up with PCP Additional Notes: pt went to pharmacy and picked up isosorbide mononitrate 60mg  and said her label said 2 tabs daily. Pt didn't know her dose had increased. I reviewed medication and advised pt that rx is supposed to be just 1 tab QD. Pt verbalized understanding and said her daughter was on phone with pharmacy now. Also wanted to know about upcoming appts. Pt provided with upcoming appt dates and times. No further assistance needed.   Summary: Pt concerned and has questions about Rx for isosorbide mononitrate   Pt has concerns about the instructions for the isosorbide mononitrate (IMDUR) 60 MG 24 hr tablet. Pt confused about the Rx because she said there is one for 60 mg and another for 90 mg.     Reason for Disposition  Caller has medicine question only, adult not sick, AND triager answers question  Answer Assessment - Initial Assessment Questions 1. NAME of MEDICINE: "What medicine(s) are you calling about?"     Isosorbide 60mg   2. QUESTION: "What is your question?" (e.g., double dose of medicine, side effect)     Picked up from pharmacy and label said to take twice a day  3. PRESCRIBER: "Who prescribed the medicine?" Reason: if prescribed by specialist, call should be referred to that group.     Jolene, NP 4. SYMPTOMS: "Do you have any symptoms?" If Yes, ask: "What symptoms are you having?"  "How bad are the symptoms (e.g., mild, moderate, severe)     NA  Protocols used: Medication Question Call-A-AH

## 2022-12-05 NOTE — Patient Instructions (Signed)
Heart Failure Action Plan A heart failure action plan helps you understand what to do when you have symptoms of heart failure. Your action plan is a color-coded plan that lists the symptoms to watch for and indicates what actions to take. If you have symptoms in the red zone, you need medical care right away. If you have symptoms in the yellow zone, you are having problems. If you have symptoms in the green zone, you are doing well. Follow the plan that was created by you and your health care provider. Review your plan each time you visit your health care provider. Red zone These signs and symptoms mean you should get medical help right away: You have trouble breathing when resting. You have a dry cough that is getting worse. You have swelling or pain in your legs or abdomen that is getting worse. You suddenly gain more than 2-3 lb (0.9-1.4 kg) in 24 hours, or more than 5 lb (2.3 kg) in a week. This amount may be more or less depending on your condition. You have trouble staying awake or you feel confused. You have chest pain. You do not have an appetite. You pass out. You have worsening sadness or depression. If you have any of these symptoms, call your local emergency services (911 in the [place] right away. Do not drive yourself to the hospital. Yellow zone These signs and symptoms mean your condition may be getting worse and you should make some changes: You have trouble breathing when you are active, or you need to sleep with your head raised on extra pillows to help you breathe. You have swelling in your legs or abdomen. You gain 2-3 lb (0.9-1.4 kg) in 24 hours, or 5 lb (2.3 kg) in a week. This amount may be more or less depending on your condition. You get tired easily. You have trouble sleeping. You have a dry cough. If you have any of these symptoms: Contact your health care provider within the next day. Your health care provider may adjust your medicines. Green zone These signs  mean you are doing well and can continue what you are doing: You do not have shortness of breath. You have very little swelling or no new swelling. Your weight is stable (no gain or loss). You have a normal activity level. You do not have chest pain or any other new symptoms. Follow these instructions at home: Take over-the-counter and prescription medicines only as told by your health care provider. Weigh yourself daily. Your target weight is __________ lb (__________ kg). Call your health care provider if you gain more than __________ lb (__________ kg) in 24 hours, or more than __________ lb (__________ kg) in a week. Health care provider name: _____________________________________________________ Health care provider phone number: _____________________________________________________ Eat a heart-healthy diet. Work with a diet and nutrition specialist (dietitian) to create an eating plan that is best for you. Keep all follow-up visits. This is important. Where to find more information American Heart Association: Summary A heart failure action plan helps you understand what to do when you have symptoms of heart failure. Follow the action plan that was created by you and your health care provider. Get help right away if you have any symptoms in the red zone. This information is not intended to replace advice given to you by your health care provider. Make sure you discuss any questions you have with your health care provider. Document Revised: 11/24/2021 Document Reviewed: 03/31/2020 Elsevier Patient Education  2023 Elsevier Inc.  

## 2022-12-07 ENCOUNTER — Encounter: Payer: Self-pay | Admitting: Nurse Practitioner

## 2022-12-07 ENCOUNTER — Ambulatory Visit (INDEPENDENT_AMBULATORY_CARE_PROVIDER_SITE_OTHER): Payer: 59 | Admitting: Nurse Practitioner

## 2022-12-07 VITALS — BP 128/87 | HR 69 | Temp 98.0°F | Ht 62.99 in | Wt 236.2 lb

## 2022-12-07 DIAGNOSIS — I77810 Thoracic aortic ectasia: Secondary | ICD-10-CM

## 2022-12-07 DIAGNOSIS — I252 Old myocardial infarction: Secondary | ICD-10-CM

## 2022-12-07 DIAGNOSIS — I10 Essential (primary) hypertension: Secondary | ICD-10-CM

## 2022-12-07 DIAGNOSIS — R7309 Other abnormal glucose: Secondary | ICD-10-CM | POA: Diagnosis not present

## 2022-12-07 DIAGNOSIS — Z91148 Patient's other noncompliance with medication regimen for other reason: Secondary | ICD-10-CM

## 2022-12-07 DIAGNOSIS — E559 Vitamin D deficiency, unspecified: Secondary | ICD-10-CM

## 2022-12-07 DIAGNOSIS — K219 Gastro-esophageal reflux disease without esophagitis: Secondary | ICD-10-CM

## 2022-12-07 DIAGNOSIS — I700 Atherosclerosis of aorta: Secondary | ICD-10-CM | POA: Diagnosis not present

## 2022-12-07 DIAGNOSIS — I5032 Chronic diastolic (congestive) heart failure: Secondary | ICD-10-CM

## 2022-12-07 DIAGNOSIS — I471 Supraventricular tachycardia, unspecified: Secondary | ICD-10-CM

## 2022-12-07 DIAGNOSIS — I25118 Atherosclerotic heart disease of native coronary artery with other forms of angina pectoris: Secondary | ICD-10-CM | POA: Diagnosis not present

## 2022-12-07 DIAGNOSIS — E782 Mixed hyperlipidemia: Secondary | ICD-10-CM | POA: Diagnosis not present

## 2022-12-07 LAB — MICROALBUMIN, URINE WAIVED
Creatinine, Urine Waived: 10 mg/dL (ref 10–300)
Microalb, Ur Waived: 10 mg/L (ref 0–19)

## 2022-12-07 LAB — BAYER DCA HB A1C WAIVED: HB A1C (BAYER DCA - WAIVED): 6.3 % — ABNORMAL HIGH (ref 4.8–5.6)

## 2022-12-07 MED ORDER — FAMOTIDINE 40 MG/5ML PO SUSR: 20.0000 mg | Freq: Every day | ORAL | 4 refills | Status: DC

## 2022-12-07 NOTE — Assessment & Plan Note (Signed)
A1c 6% recent check -- recheck today along with urine ALB, continue Losartan for kidney protection and recommend patient focus heavily on diet changes and regular activity.

## 2022-12-07 NOTE — Assessment & Plan Note (Signed)
BMI 41.85 with HTN, OLD MI, HLD. Recommended eating smaller high protein, low fat meals more frequently and exercising 30 mins a day 5 times a week with a goal of 10-15lb weight loss in the next 3 months. Patient voiced their understanding and motivation to adhere to these recommendations.

## 2022-12-07 NOTE — Assessment & Plan Note (Signed)
Chronic, ongoing.  BP at goal in office today.  Is working on cutting back on sodium.  Does not consistently take medication, but is improving in this.  Recommend ensuring medication taken daily + checking BP at home at least a few mornings a week.  Continue collaboration with cardiology and review notes.  Continue current medication regimen at this time, suspect adjustments will need to be made in future if any elevations in BP.  Labs: CMP and urine ALB. Continue to collaborate with cardiology.

## 2022-12-07 NOTE — Assessment & Plan Note (Signed)
Ongoing issue, is not taking Pepcid as instructed -- on review this has least interaction risk with her Plavix.  Discussed diet choices and to cut back on spicy food intake, she culturally eats a higher sodium diet with lots of spicier foods.  Would prefer liquid medication as takes lots of pills at baseline, will send in Pepcid liquid script for 20 MG daily to start and may increase to 20 MG twice a day if needed.  Discussed dietary choices and educated on reflux.  Return in 6 weeks for follow-up and consider GI referral in future if worsening or ongoing.

## 2022-12-07 NOTE — Assessment & Plan Note (Signed)
Chronic, ongoing.  Euvolemic on exam.  Continue current medication regimen and collaboration with cardiology.  Discussed diet choices and to cut back on sodium intake, she culturally eats a higher sodium diet with lots of spicier foods.   - Reminded to call for an overnight weight gain of >2 pounds or a weekly weight weight of >5 pounds - not adding salt to his food and has been reading food labels. Reviewed the importance of keeping daily sodium intake to 2000mg  daily  - No Ibuprofen products

## 2022-12-07 NOTE — Assessment & Plan Note (Signed)
Chronic, ongoing.  Continue current medication regimen and collaboration with cardiology.  No recent NTG use, will send in refill as needed. 

## 2022-12-07 NOTE — Assessment & Plan Note (Signed)
Ongoing issue, will continue to involve CCM team to assist. °

## 2022-12-07 NOTE — Assessment & Plan Note (Addendum)
Stable.  Followed by cardiology, continue this collaboration.  Tried taking Metoprolol, reports she never could tolerate.

## 2022-12-07 NOTE — Progress Notes (Signed)
BP 128/87   Pulse 69   Temp 98 F (36.7 C) (Oral)   Ht 5' 2.99" (1.6 m)   Wt 236 lb 3.2 oz (107.1 kg)   LMP  (LMP Unknown)   SpO2 99%   BMI 41.85 kg/m    Subjective:    Patient ID: Jennifer Grant, female    DOB: 09/29/1950, 72 y.o.   MRN: [id number]  HPI: Jennifer Grant is a 72 y.o. female  Chief Complaint  Patient presents with   Hypertension   Hyperlipidemia   Congestive Heart Failure   Sore Throat    Patient states that her esophagus is painful, wants an ENT referral    HYPERTENSION / HYPERLIPIDEMIA/HF Currently taking Losartan, Amlodipine, HCTZ, Isosorbide Mono, Spironolactone, Plavix, and ASA + Rosuvastatin. Tried Metoprolol XL 25 MG daily in past due to PAF while in hospital for MI this made her feel sick and tired all the time, she tried for several months and had to stop with cardiology.  Last saw cardiology on 08/06/22 = they discussed with her adding on Zetia and SGLT2, both of which she refused as does not want more medications.  Returns on 01/27/23.  Has Diltiazem to take as needed for elevation in BP, but has never taken.  Has history of sleep study noting OSA, but refuses CPAP.  No recent NTG use. Continues to endorse poor diet choices -- eats lots of spicy food and high sodium.     October 2023 echo EF >55% with normal LVH, chronic diastolic HF diagnosis on chart. History of MI in 2004.   Satisfied with current treatment? yes Duration of hypertension: chronic BP monitoring frequency: rarely BP range: <130/80 on average she reports BP medication side effects: no Duration of hyperlipidemia: chronic Cholesterol medication side effects: no Cholesterol supplements: none Medication compliance: good compliance Aspirin: yes Recent stressors: no Recurrent headaches: no Visual changes: no Palpitations: no Dyspnea: none Chest pain: no Lower extremity edema: no Dizzy/lightheaded: no  The ASCVD Risk score (Arnett DK, et al., 2019) failed to calculate for the  following reasons:   The patient has a prior MI or stroke diagnosis  GERD Takes occasional Pepcid -- not daily -- does not notice much benefit when takes it.  Feels like her esophagus is sore and would like referral to ENT, on further discussion she reports pain is down in esophagus and sternal area.  Does not notice it all the time, just every once and awhile.   GERD control status: exacerbated Satisfied with current treatment? yes Heartburn frequency: daily Medication side effects: no  Medication compliance: stable Dysphagia: no Odynophagia:  no Hematemesis: no Blood in stool: no EGD: no   PREDIABETES Recent A1c trending up to 6% in October 2023, does endorse poor diet choices at times.  Vitamin D level low last check, is taking supplement. Polydipsia/polyuria: no Visual disturbance: no Chest pain: no Paresthesias: no  Relevant past medical, surgical, family and social history reviewed and updated as indicated. Interim medical history since our last visit reviewed. Allergies and medications reviewed and updated.  Review of Systems  Constitutional:  Negative for activity change, appetite change, diaphoresis, fatigue and fever.  Respiratory:  Negative for cough, chest tightness and shortness of breath.   Cardiovascular:  Negative for chest pain, palpitations and leg swelling.  Gastrointestinal:  Positive for abdominal pain (epigastric). Negative for abdominal distention, constipation, diarrhea, nausea and vomiting.  Endocrine: Negative for cold intolerance, heat intolerance, polydipsia, polyphagia and polyuria.  Neurological: Negative.  Psychiatric/Behavioral: Negative.     Per HPI unless specifically indicated above     Objective:    BP 128/87   Pulse 69   Temp 98 F (36.7 C) (Oral)   Ht 5' 2.99" (1.6 m)   Wt 236 lb 3.2 oz (107.1 kg)   LMP  (LMP Unknown)   SpO2 99%   BMI 41.85 kg/m   Wt Readings from Last 3 Encounters:  12/07/22 236 lb 3.2 oz (107.1 kg)  09/06/22  236 lb (107 kg)  08/06/22 230 lb 6.4 oz (104.5 kg)    Physical Exam Vitals and nursing note reviewed.  Constitutional:      General: She is awake. She is not in acute distress.    Appearance: She is well-developed and well-groomed. She is morbidly obese. She is not ill-appearing.  HENT:     Head: Normocephalic.     Right Ear: Hearing normal.     Left Ear: Hearing normal.  Eyes:     General: Lids are normal.        Right eye: No discharge.        Left eye: No discharge.     Conjunctiva/sclera: Conjunctivae normal.     Pupils: Pupils are equal, round, and reactive to light.  Neck:     Thyroid: No thyromegaly.     Vascular: No carotid bruit.  Cardiovascular:     Rate and Rhythm: Normal rate and regular rhythm.     Heart sounds: Normal heart sounds. No murmur heard.    No gallop.  Pulmonary:     Effort: Pulmonary effort is normal. No accessory muscle usage or respiratory distress.     Breath sounds: Normal breath sounds.  Abdominal:     General: Bowel sounds are normal. There is no distension.     Palpations: Abdomen is soft. There is no hepatomegaly.     Tenderness: There is abdominal tenderness in the epigastric area. There is no right CVA tenderness or left CVA tenderness.     Hernia: No hernia is present.  Musculoskeletal:     Right shoulder: Normal.     Left shoulder: Normal.     Right wrist: Normal.     Left wrist: Normal.     Cervical back: Normal range of motion and neck supple.     Right lower leg: Edema (trace) present.     Left lower leg: Edema (trace) present.  Skin:    General: Skin is warm and dry.  Neurological:     Mental Status: She is alert and oriented to person, place, and time.  Psychiatric:        Attention and Perception: Attention normal.        Mood and Affect: Mood normal.        Speech: Speech normal.        Behavior: Behavior normal. Behavior is cooperative.        Thought Content: Thought content normal.    Results for orders placed or  performed in visit on 08/06/22  Basic metabolic panel  Result Value Ref Range   Glucose 94 70 - 99 mg/dL   BUN 10 8 - 27 mg/dL   Creatinine, Ser 6.38 0.57 - 1.00 mg/dL   eGFR 72 >75 IE/PPI/9.51   BUN/Creatinine Ratio 12 12 - 28   Sodium 134 134 - 144 mmol/L   Potassium 4.3 3.5 - 5.2 mmol/L   Chloride 96 96 - 106 mmol/L   CO2 20 20 - 29 mmol/L   Calcium  10.4 (H) 8.7 - 10.3 mg/dL  Magnesium  Result Value Ref Range   Magnesium 2.3 1.6 - 2.3 mg/dL  TSH  Result Value Ref Range   TSH 2.580 0.450 - 4.500 uIU/mL      Assessment & Plan:   Problem List Items Addressed This Visit       Cardiovascular and Mediastinum   (HFpEF) heart failure with preserved ejection fraction - Primary (Chronic)    Chronic, ongoing.  Euvolemic on exam.  Continue current medication regimen and collaboration with cardiology.  Discussed diet choices and to cut back on sodium intake, she culturally eats a higher sodium diet with lots of spicier foods.   - Reminded to call for an overnight weight gain of >2 pounds or a weekly weight weight of >5 pounds - not adding salt to his food and has been reading food labels. 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  Continue current medication regimen and collaboration with cardiology.  No recent NTG use, will send in refill as needed.      Relevant Orders   Comprehensive metabolic panel   Lipid Panel w/o Chol/HDL Ratio   Essential hypertension (Chronic)    Chronic, ongoing.  BP at goal in office today.  Is working on cutting back on sodium.  Does not consistently take medication, but is improving in this.  Recommend ensuring medication taken daily + checking BP at home at least a  few mornings a week.  Continue collaboration with cardiology and review notes.  Continue current medication regimen at this time, suspect adjustments will need to be made in future if any elevations in BP.  Labs: CMP and urine ALB. Continue to collaborate with cardiology.      Relevant Orders   Comprehensive metabolic panel   SVT (supraventricular tachycardia) (Chronic)    Stable.  Followed by cardiology, continue this collaboration.  Tried taking Metoprolol, reports she never could tolerate.      Relevant Orders   Comprehensive metabolic panel   Aortic atherosclerosis    Chronic, ongoing.  Noted on CXR 04/16/21. Check lipid panel today.  Continue preventative statin and ASA.      Relevant Orders   Comprehensive metabolic panel   Lipid Panel w/o Chol/HDL Ratio   Ascending aorta dilation    Ongoing, continue collaboration with cardiology. Recent noted reviewed.      Old MI (myocardial infarction)    In 2004, continue current medication regimen for prevention and collaboration with cardiology.      Relevant Orders   Comprehensive metabolic panel   Lipid Panel w/o Chol/HDL Ratio     Digestive   GERD without esophagitis    Ongoing issue, is not taking Pepcid as instructed -- on review this has least interaction risk with her Plavix.  Discussed diet choices and to cut back on spicy food intake, she culturally eats a higher sodium diet with lots of spicier foods.  Would prefer liquid medication as takes lots of pills at baseline, will send in Pepcid liquid script for 20 MG daily to start and may increase to 20 MG twice a day if needed.  Discussed dietary choices and educated on reflux.  Return in 6  weeks for follow-up and consider GI referral in future if worsening or ongoing.      Relevant Medications   famotidine (PEPCID) 40 MG/5ML suspension     Other   Hyperlipidemia (Chronic)    Chronic, ongoing.  Continue current medication regimen and adjust as needed.  Lipid panel today.   Have recommended she take this daily to help with prevention.  Discussed this at length with patient.      Relevant Orders   Comprehensive metabolic panel   Lipid Panel w/o Chol/HDL Ratio   Elevated hemoglobin A1c    A1c 6% recent check -- recheck today along with urine ALB, continue Losartan for kidney protection and recommend patient focus heavily on diet changes and regular activity.      Relevant Orders   Bayer DCA Hb A1c Waived   Microalbumin, Urine Waived   Morbid obesity    BMI 41.85 with HTN, OLD MI, HLD. Recommended eating smaller high protein, low fat meals more frequently and exercising 30 mins a day 5 times a week with a goal of 10-15lb weight loss in the next 3 months. Patient voiced their understanding and motivation to adhere to these recommendations.       Noncompliance with medication regimen    Ongoing issue, will continue to involve CCM team to assist.      Vitamin D deficiency    Ongoing, recommend continue daily supplement and adjust as needed.  Recheck level today.  DEXA was normal range in October 2023, repeat in 10 years, sooner if fracture presents.      Relevant Orders   VITAMIN D 25 Hydroxy (Vit-D Deficiency, Fractures)     Follow up plan: Return in about 6 weeks (around 01/18/2023) for GERD -- added liquid Pepcid.

## 2022-12-07 NOTE — Assessment & Plan Note (Signed)
In 2004, continue current medication regimen for prevention and collaboration with cardiology. 

## 2022-12-07 NOTE — Assessment & Plan Note (Signed)
Chronic, ongoing.  Continue current medication regimen and adjust as needed.  Lipid panel today.  Have recommended she take this daily to help with prevention.  Discussed this at length with patient. 

## 2022-12-07 NOTE — Assessment & Plan Note (Signed)
Ongoing, continue collaboration with cardiology. Recent noted reviewed. 

## 2022-12-07 NOTE — Assessment & Plan Note (Signed)
Ongoing, recommend continue daily supplement and adjust as needed.  Recheck level today.  DEXA was normal range in October 2023, repeat in 10 years, sooner if fracture presents.

## 2022-12-07 NOTE — Assessment & Plan Note (Signed)
Chronic, ongoing.  Noted on CXR 04/16/21. Check lipid panel today.  Continue preventative statin and ASA.

## 2022-12-08 LAB — LIPID PANEL W/O CHOL/HDL RATIO
Cholesterol, Total: 144 mg/dL (ref 100–199)
HDL: 41 mg/dL (ref >39)
LDL Chol Calc (NIH): 86 mg/dL (ref 0–99)
Triglycerides: 89 mg/dL (ref 0–149)
VLDL Cholesterol Cal: 17 mg/dL (ref 5–40)

## 2022-12-08 LAB — COMPREHENSIVE METABOLIC PANEL
ALT: 18 IU/L (ref 0–32)
AST: 19 IU/L (ref 0–40)
Albumin/Globulin Ratio: 1.1 — ABNORMAL LOW (ref 1.2–2.2)
Albumin: 4.3 g/dL (ref 3.8–4.8)
Alkaline Phosphatase: 64 IU/L (ref 44–121)
BUN/Creatinine Ratio: 13 (ref 12–28)
BUN: 10 mg/dL (ref 8–27)
Bilirubin Total: 0.6 mg/dL (ref 0.0–1.2)
CO2: 22 mmol/L (ref 20–29)
Calcium: 10.8 mg/dL — ABNORMAL HIGH (ref 8.7–10.3)
Chloride: 96 mmol/L (ref 96–106)
Creatinine, Ser: 0.79 mg/dL (ref 0.57–1.00)
Globulin, Total: 4 g/dL (ref 1.5–4.5)
Glucose: 88 mg/dL (ref 70–99)
Potassium: 3.8 mmol/L (ref 3.5–5.2)
Sodium: 137 mmol/L (ref 134–144)
Total Protein: 8.3 g/dL (ref 6.0–8.5)
eGFR: 78 mL/min/{1.73_m2} (ref >59)

## 2022-12-08 LAB — VITAMIN D 25 HYDROXY (VIT D DEFICIENCY, FRACTURES): Vit D, 25-Hydroxy: 34.7 ng/mL (ref 30.0–100.0)

## 2022-12-14 NOTE — Progress Notes (Unsigned)
GYNECOLOGY PROGRESS NOTE  Subjective:    Patient ID: Jennifer Grant, female    DOB: 07/25/1951, 72 y.o.   MRN: [id number]  HPI  Patient is a 72 y.o. [id number] female who presents for follow-up of pelvic organ prolapse.  Patient was last seen in 2022.  Was debating between surgical intervention and pessary use.  She initially chose to trial a pessary.  Went through 2 pessaries before a suitable option was noted.  However soon after you she developed a rash in the groin area and thought it may have been due to the pessary so she stopped using.  Desire to look into a pessary made from a different material however today I informed her that no other such pessaries existed at this time.  Currently she is experiencing some pelvic pain and back pain.  Pelvic pain is more specific to the groin area.    Of note, patient notes that she underwent a colonoscopy and endoscopy in 2022 for GI complaints.  Reports that she was told after her surgery that "they almost lost her" for a short period of time during the procedure.  At this time she does not think that surgery may be a viable option.   Past Medical History:  Diagnosis Date   CAD (coronary artery disease)    a. 2004 s/p MI w/ PCI/stenting to the LCX, OM1, OM2, RCA, RPDA; b. 05/2015 MI/PCI: 100 OM1 (2.75x20 & 3.0x8 Promus Premier DES'), PTCA OM2; c. 07/2015 Cath/Staged PCI: LM 60, LAD 75d, LCX 66m ISR, OM1 ok, OM2 25 ISR, RCA 50p, 57m/d, 85d (3.0x28 Promus Premier DES).   COVID 2 months ago   Diastolic dysfunction    a. 05/2015 Echo: EF 50-55%, severe lateral HK. Gr1 DD, mildly dil LA. Mildly reduced RV fxn.   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    "got booster shot a few times"   GERD (gastroesophageal reflux disease)    Hyperlipidemia    Hypertensive heart disease    Menopausal state    Morbid obesity    Noncompliance    Sleep apnea    SVT (supraventricular tachycardia) 12/04/2021   Monitor 08/2022: NSR, PSVT (HR > 150 x 54 mins), PVC 2.7%    Family History   Problem Relation Age of Onset   Healthy Mother    Prostate cancer Father    Kidney disease Father    Healthy Brother    Cancer Neg Hx    Diabetes Neg Hx    Heart disease Neg Hx     Past Surgical History:  Procedure Laterality Date   CARDIAC CATHETERIZATION N/A 06/08/2015   Procedure: Left Heart Cath and Coronary Angiography;  Surgeon: Lyn Records, MD;  Location: [facility] INVASIVE CV LAB;  Service: Cardiovascular;  Laterality: N/A;   CARDIAC CATHETERIZATION N/A 06/08/2015   Procedure: Coronary Stent Intervention;  Surgeon: Lyn Records, MD;  Location: [facility] INVASIVE CV LAB;  Service: Cardiovascular;  Laterality: N/A;   CARDIAC CATHETERIZATION N/A 07/16/2015   Procedure: Coronary Stent Intervention;  Surgeon: Lyn Records, MD;  Location: [facility] INVASIVE CV LAB;  Service: Cardiovascular;  Laterality: N/A;   COLONOSCOPY N/A 02/17/2021   Procedure: COLONOSCOPY;  Surgeon: Midge Minium, MD;  Location: [facility] ENDOSCOPY;  Service: Endoscopy;  Laterality: N/A;   CORONARY ANGIOPLASTY WITH STENT PLACEMENT     "6 stents before 05/2015"   KNEE ARTHROSCOPY Left 1990's   TUBAL LIGATION  ~ 1978    Social History   Socioeconomic History   Marital status: Widowed  Spouse name: Not on file   Number of children: Not on file   Years of education: Not on file   Highest education level: 11th grade  Occupational History   Occupation: retired  Tobacco Use   Smoking status: Never   Smokeless tobacco: Never  Vaping Use   Vaping Use: Never used  Substance and Sexual Activity   Alcohol use: No   Drug use: No   Sexual activity: Not Currently    Birth control/protection: Surgical  Other Topics Concern   Not on file  Social History Narrative   Not on file   Social Determinants of Health   Financial Resource Strain: Medium Risk (05/04/2022)   Overall Financial Resource Strain (CARDIA)    Difficulty of Paying Living Expenses: Somewhat hard  Food Insecurity: Food Insecurity Present (07/28/2022)   Hunger  Vital Sign    Worried About Running Out of Food in the Last Year: Sometimes true    Ran Out of Food in the Last Year: Sometimes true  Transportation Needs: No Transportation Needs (07/28/2022)   PRAPARE - Administrator, Civil Service (Medical): No    Lack of Transportation (Non-Medical): No  Physical Activity: Inactive (05/04/2022)   Exercise Vital Sign    Days of Exercise per Week: 0 days    Minutes of Exercise per Session: 0 min  Stress: No Stress Concern Present (05/04/2022)   [employer] of Occupational Health - Occupational Stress Questionnaire    Feeling of Stress : Not at all  Social Connections: Moderately Isolated (05/04/2022)   Social Connection and Isolation Panel [NHANES]    Frequency of Communication with Friends and Family: Three times a week    Frequency of Social Gatherings with Friends and Family: Three times a week    Attends Religious Services: More than 4 times per year    Active Member of Clubs or Organizations: No    Attends Banker Meetings: Never    Marital Status: Widowed  Intimate Partner Violence: Not At Risk (05/04/2022)   Humiliation, Afraid, Rape, and Kick questionnaire    Fear of Current or Ex-Partner: No    Emotionally Abused: No    Physically Abused: No    Sexually Abused: No    Current Outpatient Medications on File Prior to Visit  Medication Sig Dispense Refill   amLODipine (NORVASC) 10 MG tablet Take 1 tablet (10 mg total) by mouth daily. 90 tablet 4   aspirin EC 81 MG tablet Take 81 mg by mouth daily.     cholecalciferol (VITAMIN D3) 25 MCG (1000 UNIT) tablet Take 1,000 Units by mouth daily.     clopidogrel (PLAVIX) 75 MG tablet Take 1 tablet by mouth once daily 90 tablet 4   diltiazem (CARDIZEM) 30 MG tablet Take 1 tablet (30 mg total) by mouth as needed. 30 tablet 3   famotidine (PEPCID) 40 MG/5ML suspension Take 2.5 mLs (20 mg total) by mouth daily. 150 mL 4   hydrochlorothiazide (HYDRODIURIL) 25 MG tablet Take 1  tablet (25 mg total) by mouth daily. 90 tablet 4   isosorbide mononitrate (IMDUR) 60 MG 24 hr tablet Take 1 tablet (60 mg total) by mouth daily. 90 tablet 4   lidocaine (LIDODERM) 5 % Place 1 patch onto the skin daily. Remove & Discard patch within 12 hours or as directed by MD 30 patch 5   losartan (COZAAR) 100 MG tablet Take 1 tablet (100 mg total) by mouth daily. 90 tablet 4   nitroGLYCERIN (NITROSTAT)  0.4 MG SL tablet DISSOLVE ONE TABLET UNDER THE TONGUE EVERY 5 MINUTES AS NEEDED FOR CHEST PAIN.  DO NOT EXCEED A TOTAL OF 3 DOSES IN 15 MINUTES 25 tablet 0   rosuvastatin (CRESTOR) 40 MG tablet Take 1 tablet (40 mg total) by mouth daily. 90 tablet 4   spironolactone (ALDACTONE) 25 MG tablet Take 1 tablet (25 mg total) by mouth every other day. 45 tablet 4   UNABLE TO FIND Neo-Cell     No current facility-administered medications on file prior to visit.    Allergies  Allergen Reactions   Brilinta [Ticagrelor] Shortness Of Breath   Lipitor [Atorvastatin] Itching    Mouth itching, cough   Latex Other (See Comments)    Gloves make hands look black after prolonged use   Penicillins Itching and Swelling    Tongue itching and lip swelling Has patient had a PCN reaction causing immediate rash, facial/tongue/throat swelling, SOB or lightheadedness with hypotension: yes Has patient had a PCN reaction causing severe rash involving mucus membranes or skin necrosis: No Has patient had a PCN reaction that required hospitalization No Has patient had a PCN reaction occurring within the last 10 years: No If all of the above answers are "NO", then may proceed with Cephalosporin use.   Tekturna [Aliskiren] Rash     Review of Systems Pertinent items noted in HPI and remainder of comprehensive ROS otherwise negative.   Objective:   Blood pressure (!) 146/77, pulse 70, resp. rate 16, height  (1.575 m), weight 238 lb (108 kg). There is no height or weight on file to calculate BMI. General  appearance: alert and no distress Abdomen: soft, non-tender; bowel sounds normal; no masses,  no organomegaly Pelvic:          VULVA: normal appearing vulva with no masses, tenderness or lesions       VAGINA: normal appearing vagina with no discharge, no lesions, atrophic. Grade 3 cystocele, with Grade 1-2 rectocele and incomplete uterine prolapse (Grade 2)        CERVIX: normal appearing cervix without discharge or lesions        UTERUS: uterus is normal size, shape, consistency and non-tender        ADNEXA: normal adnexa in size, nontender and no masses Extremities: extremities normal, atraumatic, no cyanosis or edema Neurologic: Grossly normal   Assessment:   1. 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 Pelvic pain      Plan:   Discussion had with patient regarding her symptoms.  At this time I still believe that it is best that we attempt management of her pelvic organ prolapse with a pessary.  Would attempt to avoid surgical intervention unless truly necessary due to recent history of adverse events under anesthesia.  Patient is willing to retry pessary at this time.  I discussed that this reduction in her prolapse may also help with her pelvic pain.  Patient will schedule appointment to have pessary reinserted sometime next week.   A total of 30 minutes were spent face-to-face with the patient during the encounter with greater than 50% dealing with counseling and coordination of care.   Hildred Laser, MD [facility]

## 2022-12-15 ENCOUNTER — Encounter: Payer: Self-pay | Admitting: Obstetrics and Gynecology

## 2022-12-15 ENCOUNTER — Ambulatory Visit (INDEPENDENT_AMBULATORY_CARE_PROVIDER_SITE_OTHER): Payer: 59 | Admitting: Obstetrics and Gynecology

## 2022-12-15 VITALS — BP 146/77 | HR 70 | Resp 16 | Ht 62.0 in | Wt 238.0 lb

## 2022-12-15 DIAGNOSIS — R102 Pelvic and perineal pain: Secondary | ICD-10-CM

## 2022-12-15 DIAGNOSIS — N812 Incomplete uterovaginal prolapse: Secondary | ICD-10-CM | POA: Diagnosis not present

## 2022-12-17 NOTE — Progress Notes (Deleted)
    GYNECOLOGY PROGRESS NOTE  Subjective:    Patient ID: RUTHELLEN TIPPY, female    DOB: Mar 23, 1951, 72 y.o.   MRN: [id number]  HPI  Patient is a 72 y.o. [id number] female who presents for pelvic organ prolapse. Was debating between surgical intervention and pessary use. Patient is willing to retry pessary and is here today for insertion of size ****  {Common ambulatory SmartLinks:19316}  Review of Systems {ros; complete:30496}   Objective:   There were no vitals taken for this visit. There is no height or weight on file to calculate BMI. General appearance: {general exam:16600} Abdomen: {abdominal exam:16834} Pelvic: {pelvic exam:16852::"cervix normal in appearance","external genitalia normal","no adnexal masses or tenderness","no cervical motion tenderness","rectovaginal septum normal","uterus normal size, shape, and consistency","vagina normal without discharge"} Extremities: {extremity exam:5109} Neurologic: {neuro exam:17854}   Assessment:   1. Pelvic pain   2. 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    Hildred Laser, MD [facility]

## 2022-12-21 ENCOUNTER — Ambulatory Visit: Payer: 59 | Admitting: Obstetrics and Gynecology

## 2022-12-21 DIAGNOSIS — R102 Pelvic and perineal pain: Secondary | ICD-10-CM

## 2022-12-21 DIAGNOSIS — N812 Incomplete uterovaginal prolapse: Secondary | ICD-10-CM

## 2022-12-22 ENCOUNTER — Encounter: Payer: Self-pay | Admitting: Obstetrics and Gynecology

## 2022-12-22 ENCOUNTER — Ambulatory Visit (INDEPENDENT_AMBULATORY_CARE_PROVIDER_SITE_OTHER): Payer: 59 | Admitting: Obstetrics and Gynecology

## 2022-12-22 VITALS — BP 130/79 | HR 67 | Resp 16 | Ht 62.0 in | Wt 235.5 lb

## 2022-12-22 DIAGNOSIS — N812 Incomplete uterovaginal prolapse: Secondary | ICD-10-CM

## 2022-12-22 DIAGNOSIS — R102 Pelvic and perineal pain: Secondary | ICD-10-CM

## 2022-12-22 NOTE — Progress Notes (Signed)
    GYNECOLOGY PROGRESS NOTE  Subjective:    Patient ID: Jennifer Grant, female    DOB: 10/01/1950, 72 y.o.   MRN: [id number]  HPI  Patient is a 72 y.o. [id number] female who presents for pessary reinsertion. She has pelvic organ prolapse. (Grade 3 cystocele, Grade 1-2 rectocele, and incomplete uterine prolapse). She has decided to try her pessary again instead of surgical intervention. Currently she is experiencing some pelvic pain and back pain.   The following portions of the patient's history were reviewed and updated as appropriate: allergies, current medications, past family history, past medical history, past social history, past surgical history, and problem list.  Review of Systems Pertinent items noted in HPI and remainder of comprehensive ROS otherwise negative.   Objective:   Blood pressure 130/79, pulse 67, resp. rate 16, height  (1.575 m), weight 235 lb 8 oz (106.8 kg). Body mass index is 43.07 kg/m. General appearance: alert and no distress Pelvic: exam deferred. Size 3 ring with support inserted today. .   Assessment:   1. 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 Pelvic pain      Plan:   - Pessary inserted today.  To f/u in 2-3 weeks to reassess symptoms.    Hildred Laser, MD [facility]

## 2023-01-10 ENCOUNTER — Telehealth: Payer: Self-pay | Admitting: *Deleted

## 2023-01-10 NOTE — Patient Outreach (Signed)
  Care Coordination   Follow Up Visit Note   01/10/2023 Name: Jennifer Grant MRN: [id number] DOB: April 11, 1951  Jennifer Grant is a 72 y.o. year old female who sees [place], Corrie Dandy T, NP for primary care. I spoke with  [name] by phone today.  What matters to the patients health and wellness today?  Relief of shoulder pain    Goals Addressed             This Visit's Progress    Management of chronic pain       Care Coordination Interventions: Reviewed provider established plan for pain management Discussed importance of adherence to all scheduled medical appointments Counseled on the importance of reporting any/all new or changed pain symptoms or management strategies to pain management provider Discussed use of relaxation techniques and/or diversional activities to assist with pain reduction (distraction, imagery, relaxation, massage, acupressure, TENS, heat, and cold application Reviewed with patient prescribed pharmacological and nonpharmacological pain relief strategies          SDOH assessments and interventions completed:  No     Care Coordination Interventions:  Yes, provided   Interventions Today    Flowsheet Row Most Recent Value  Chronic Disease   Chronic disease during today's visit Other  [chronic pain]  General Interventions   General Interventions Discussed/Reviewed General Interventions Reviewed, Doctor Visits  Doctor Visits Discussed/Reviewed Doctor Visits Reviewed, PCP  [facility] urgent visit with PCP tomorrow to address shoulder pain]  PCP/Specialist Visits Compliance with follow-up visit  Education Interventions   Education Provided Provided Education  Provided Verbal Education On Medication, When to see the doctor  [Does not like taking medications, will take Tylenol if pain is bad enough.  Also discussed use of Lidocaine patch]        Follow up plan: Follow up call scheduled for 5/20    Encounter Outcome:  Pt. Visit Completed   Kemper Durie, RN, MSN, [facility] Care Management Care Management Coordinator [phone]

## 2023-01-11 ENCOUNTER — Encounter: Payer: Self-pay | Admitting: Nurse Practitioner

## 2023-01-11 ENCOUNTER — Ambulatory Visit (INDEPENDENT_AMBULATORY_CARE_PROVIDER_SITE_OTHER): Payer: 59 | Admitting: Nurse Practitioner

## 2023-01-11 VITALS — BP 138/85 | HR 69 | Temp 97.6°F | Ht 62.01 in | Wt 234.3 lb

## 2023-01-11 DIAGNOSIS — G8929 Other chronic pain: Secondary | ICD-10-CM

## 2023-01-11 DIAGNOSIS — M25511 Pain in right shoulder: Secondary | ICD-10-CM | POA: Diagnosis not present

## 2023-01-11 DIAGNOSIS — M79601 Pain in right arm: Secondary | ICD-10-CM

## 2023-01-11 DIAGNOSIS — M25512 Pain in left shoulder: Secondary | ICD-10-CM
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
## 2023-01-11 NOTE — Patient Instructions (Signed)
Shoulder Pain Many things can cause shoulder pain, including: An injury. Moving the shoulder in the same way again and again (overuse). Joint pain (arthritis). Pain can come from: Swelling and irritation (inflammation) of any part of the shoulder. An injury to: The shoulder joint. Tissues that connect muscle to bone (tendons). Tissues that connect bones to each other (ligaments). Bones. Follow these instructions at home: Watch for changes in your symptoms. Let your doctor know about them. Follow these instructions to help with your pain. If you have a sling that can be taken off: Wear the sling as told by your doctor. Take it off only as told by your doctor. Check the skin around the sling every day. Tell your doctor if you see problems. Loosen the sling if your fingers: Tingle. Become numb. Become cold. Keep the sling clean. If the sling is not waterproof: Do not let it get wet. Take the sling off when you shower or bathe. Managing pain, stiffness, and swelling  If told, put ice on the painful area. Put ice in a plastic bag. Place a towel between your skin and the bag. Leave the ice on for 20 minutes, 2-3 times a day. Stop putting ice on if it does not help with the pain. If your skin turns bright red, take off the ice right away to prevent skin damage. The risk of damage is higher if you cannot feel pain, heat, or cold. Squeeze a soft ball or a foam pad as much as possible. This prevents swelling in the shoulder. It also helps to strengthen the arm. General instructions Take over-the-counter and prescription medicines only as told by your doctor. Keep all follow-up visits. This will help you avoid any type of permanent shoulder problems. Contact a doctor if: Your pain gets worse. Medicine does not help your pain. You have new pain in your arm, hand, or fingers. You loosen your sling and your arm, hand, or fingers: Tingle. Are numb. Are swollen. Get help right away  if: Your arm, hand, or fingers turn white or blue. This information is not intended to replace advice given to you by your health care provider. Make sure you discuss any questions you have with your health care provider. Document Revised: 03/19/2022 Document Reviewed: 03/19/2022 Elsevier Patient Education  2023 Elsevier Inc.  

## 2023-01-13 NOTE — Progress Notes (Unsigned)
    GYNECOLOGY PROGRESS NOTE  Subjective:    Patient ID: Jennifer Grant, female    DOB: October 13, 1950, 72 y.o.   MRN: [id number]  HPI  Patient is a 72 y.o. [id number] female who presents for pessary check. She has pelvic organ prolapse. (Grade 3 cystocele, Grade 1-2 rectocele, and incomplete uterine prolapse). She has try surgical intervention instead of using the pessary because she was having some urinary incontinence problems.  Currently she is experiencing some pelvic pain and back pain.     The following portions of the patient's history were reviewed and updated as appropriate: allergies, current medications, past family history, past medical history, past social history, past surgical history, and problem list.  Review of Systems Pertinent items noted in HPI and remainder of comprehensive ROS otherwise negative.   Objective:   There were no vitals taken for this visit. There is no height or weight on file to calculate BMI. General appearance: alert, cooperative, and no distress Abdomen: {abdominal exam:16834} Pelvic: {pelvic exam:16852::"cervix normal in appearance","external genitalia normal","no adnexal masses or tenderness","no cervical motion tenderness","rectovaginal septum normal","uterus normal size, shape, and consistency","vagina normal without discharge"} Extremities: {extremity exam:5109} Neurologic: {neuro exam:17854}   Assessment:   1. 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 Pelvic pain      Plan:   There are no diagnoses linked to this encounter.    Hildred Laser, MD [facility]

## 2023-01-14 ENCOUNTER — Encounter: Payer: Self-pay | Admitting: Obstetrics and Gynecology

## 2023-01-14 ENCOUNTER — Ambulatory Visit (INDEPENDENT_AMBULATORY_CARE_PROVIDER_SITE_OTHER): Payer: 59 | Admitting: Obstetrics and Gynecology

## 2023-01-14 VITALS — BP 147/76 | HR 64 | Resp 16 | Ht 62.0 in | Wt 235.7 lb

## 2023-01-14 DIAGNOSIS — Z01818 Encounter for other preprocedural examination: Secondary | ICD-10-CM

## 2023-01-14 DIAGNOSIS — I25118 Atherosclerotic heart disease of native coronary artery with other forms of angina pectoris: Secondary | ICD-10-CM | POA: Diagnosis not present

## 2023-01-14 DIAGNOSIS — I10 Essential (primary) hypertension: Secondary | ICD-10-CM

## 2023-01-14 DIAGNOSIS — R102 Pelvic and perineal pain: Secondary | ICD-10-CM | POA: Diagnosis not present

## 2023-01-14 DIAGNOSIS — N812 Incomplete uterovaginal prolapse: Secondary | ICD-10-CM

## 2023-01-14 NOTE — Patient Instructions (Addendum)
GYNECOLOGY PRE-OPERATIVE INSTRUCTIONS  You are scheduled for surgery on 03/07/2023.  The name of your procedure is: Total Vaginal Hysterectomy with Bilateral Salpingo-oophorectomy, anterior//posterior repair.  Please read through these instructions carefully regarding preparation for your surgery: Nothing to eat after midnight on the day prior to surgery.  Do not take any medications unless recommended by your provider on day prior to surgery.  Do not take NSAIDs (Motrin, Aleve) or aspirin 7 days prior to surgery.  You may take Tylenol products for minor aches and pains.  You will receive a prescription for pain medications post-operatively.  You will need to follow with your Cardiologist for medical clearance.  Please call the office if you have any questions regarding your upcoming surgery.    Thank you for choosing [facility] at [facility].

## 2023-01-15 ENCOUNTER — Encounter: Payer: Self-pay | Admitting: Obstetrics and Gynecology

## 2023-01-16 NOTE — Patient Instructions (Signed)

## 2023-01-17 ENCOUNTER — Ambulatory Visit: Payer: Self-pay | Admitting: *Deleted

## 2023-01-17 NOTE — Patient Outreach (Signed)
  Care Coordination   Follow Up Visit Note   01/17/2023 Name: Jennifer Grant MRN: [id number] DOB: 1950-10-06  Jennifer Grant is a 72 y.o. year old female who sees [place], Corrie Dandy T, NP for primary care. I spoke with  [name] by phone today.  What matters to the patients health and wellness today?  Continue to decrease arm pain and reschedule hysterectomy.      Goals Addressed             This Visit's Progress    Management of chronic pain   On track    Care Coordination Interventions: Reviewed provider established plan for pain management Discussed importance of adherence to all scheduled medical appointments Counseled on the importance of reporting any/all new or changed pain symptoms or management strategies to pain management provider Discussed use of relaxation techniques and/or diversional activities to assist with pain reduction (distraction, imagery, relaxation, massage, acupressure, TENS, heat, and cold application Reviewed with patient prescribed pharmacological and nonpharmacological pain relief strategies          SDOH assessments and interventions completed:  No     Care Coordination Interventions:  Yes, provided   Interventions Today    Flowsheet Row Most Recent Value  Chronic Disease   Chronic disease during today's visit Other  [Pain, report it is better]  General Interventions   General Interventions Discussed/Reviewed General Interventions Reviewed, Doctor Visits, Communication with  [Was seen for arm pain, will have ultrasound tomorrow to rule out blood clot.  Declined provider's offer for ortho and PT]  Doctor Visits Discussed/Reviewed Doctor Visits Reviewed  PCP/Specialist Visits Compliance with follow-up visit  Communication with PCP/Specialists  [facility] GYN office, scheduled for hysterectomy on 7/8, would like to rescheudle for after 7/17 when her daughter is back in town]        Follow up plan: Follow up call scheduled for 6/19     Encounter Outcome:  Pt. Visit Completed   Kemper Durie, RN, MSN, [facility] Care Management Care Management Coordinator [phone]

## 2023-01-18 ENCOUNTER — Ambulatory Visit
Admission: RE | Admit: 2023-01-18 | Discharge: 2023-01-18 | Disposition: A | Discharge status: Home / Self Care | Payer: 59 | Source: Ambulatory Visit | Attending: Nurse Practitioner | Admitting: Nurse Practitioner

## 2023-01-18 DIAGNOSIS — M79601 Pain in right arm: Secondary | ICD-10-CM

## 2023-01-18 DIAGNOSIS — M79621 Pain in right upper arm: Secondary | ICD-10-CM | POA: Diagnosis not present

## 2023-01-18 NOTE — Progress Notes (Signed)
Good morning, please let Jennifer Grant know she has no blood clot in her right upper extremity.  Good news!!

## 2023-01-21 ENCOUNTER — Ambulatory Visit (INDEPENDENT_AMBULATORY_CARE_PROVIDER_SITE_OTHER): Payer: 59 | Admitting: Nurse Practitioner

## 2023-01-21 ENCOUNTER — Encounter: Payer: Self-pay | Admitting: Nurse Practitioner

## 2023-01-21 VITALS — BP 130/84 | HR 67 | Temp 98.2°F | Ht 62.01 in | Wt 233.0 lb

## 2023-01-21 DIAGNOSIS — M25512 Pain in left shoulder: Secondary | ICD-10-CM

## 2023-01-21 DIAGNOSIS — K219 Gastro-esophageal reflux disease without esophagitis: Secondary | ICD-10-CM

## 2023-01-21 DIAGNOSIS — M25511 Pain in right shoulder: Secondary | ICD-10-CM

## 2023-01-21 DIAGNOSIS — G8929 Other chronic pain: Secondary | ICD-10-CM | POA: Diagnosis not present
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
## 2023-01-21 NOTE — Assessment & Plan Note (Signed)
Ongoing issue, is not taking Pepcid as instructed -- on review this has least interaction risk with her Plavix.  Discussed diet choices and to cut back on spicy food intake, she culturally eats a higher sodium diet with lots of spicier foods.  Would prefer continued focus on turmeric and lemon.  Will continue this, which is offering benefit and if worsening consider GI referral.

## 2023-01-27 ENCOUNTER — Ambulatory Visit: Payer: 59 | Attending: Cardiovascular Disease | Admitting: Cardiovascular Disease

## 2023-01-27 ENCOUNTER — Encounter: Payer: Self-pay | Admitting: Cardiovascular Disease

## 2023-01-27 VITALS — BP 140/100 | HR 69 | Ht 62.5 in | Wt 234.0 lb

## 2023-01-27 DIAGNOSIS — R002 Palpitations: Secondary | ICD-10-CM | POA: Diagnosis not present

## 2023-01-27 DIAGNOSIS — I77810 Thoracic aortic ectasia: Secondary | ICD-10-CM | POA: Diagnosis not present

## 2023-01-27 DIAGNOSIS — I2510 Atherosclerotic heart disease of native coronary artery without angina pectoris: Secondary | ICD-10-CM

## 2023-01-27 DIAGNOSIS — E785 Hyperlipidemia, unspecified: Secondary | ICD-10-CM | POA: Diagnosis not present

## 2023-01-27 DIAGNOSIS — I5032 Chronic diastolic (congestive) heart failure: Secondary | ICD-10-CM

## 2023-01-27 DIAGNOSIS — I10 Essential (primary) hypertension: Secondary | ICD-10-CM

## 2023-01-27 NOTE — Progress Notes (Signed)
Cardiology Office Note   Date:  01/27/2023   ID:  AMDANDA Grant, DOB 1951-02-14, MRN [id number]  PCP:  Marjie Skiff, NP  Cardiologist:   Lorine Bears, MD   Chief Complaint  Patient presents with   Follow-up    6 month f/u c/o sharp chest pain, elevated BP and edema left ankle/foot. Meds reviewed verbally with pt.      History of Present Illness: Jennifer Grant is a 72 y.o. female who presents to establish cardiovascular care.  He is to be followed by Dr. Katrinka Blazing and has the following cardiac profile:  Coronary artery disease  MI in 2004 s/p stent to LCx, OM1, OM2, RCA, RPDA Lat STEMI 05/2015 s/p POBA to OM2; 2.75 x 20 mm and 3 x 8 mm DES to OM1 LHC 06/08/2015: LAD mid and distal 50 and 75 / LCx mid stent patent /OM2 99 ISR, OM1 100 ISR /distal RCA and PDA 70-90 ISR Staged PCI 07/16/15: s/p 3 x 28 mm DES to mid distal RCA Myoview 02/17/2022 apical, apical lateral and anterolateral scar, no ischemia, EF 47, intermediate risk (HFpEF) heart failure with preserved ejection fraction  TTE 06/22/22: EF > 55, mild LVH, G1 DD, normal RVSF, moderate MAC, trivial AI, mild dilation of ascending aorta (42 mm), RAP 3 Paroxysmal atrial fibrillation  Occurred in setting of MI >> no anticoag unless recurrence  Supraventricular Tachycardia  ZIO monitor 01/27/2022: NSR, 12 episodes of SVT (longest greater than 16 minutes); Mobitz 1  She has been doing well overall with no chest pain, shortness of breath or palpitations.  Her blood pressure is elevated today but she has not taken her blood pressure medications yet.  Past Medical History:  Diagnosis Date   CAD (coronary artery disease)    a. 2004 s/p MI w/ PCI/stenting to the LCX, OM1, OM2, RCA, RPDA; b. 05/2015 MI/PCI: 100 OM1 (2.75x20 & 3.0x8 Promus Premier DES'), PTCA OM2; c. 07/2015 Cath/Staged PCI: LM 60, LAD 75d, LCX 66m ISR, OM1 ok, OM2 25 ISR, RCA 50p, 6m/d, 85d (3.0x28 Promus Premier DES).   COVID 2 months ago   Diastolic  dysfunction    a. 05/2015 Echo: EF 50-55%, severe lateral HK. Gr1 DD, mildly dil LA. Mildly reduced RV fxn.   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    "got booster shot a few times"   GERD (gastroesophageal reflux disease)    Hyperlipidemia    Hypertensive heart disease    Menopausal state    Morbid obesity (HCC)    Noncompliance    Sleep apnea    SVT (supraventricular tachycardia) 12/04/2021   Monitor 08/2022: NSR, PSVT (HR > 150 x 54 mins), PVC 2.7%    Past Surgical History:  Procedure Laterality Date   CARDIAC CATHETERIZATION N/A 06/08/2015   Procedure: Left Heart Cath and Coronary Angiography;  Surgeon: Lyn Records, MD;  Location: [facility] INVASIVE CV LAB;  Service: Cardiovascular;  Laterality: N/A;   CARDIAC CATHETERIZATION N/A 06/08/2015   Procedure: Coronary Stent Intervention;  Surgeon: Lyn Records, MD;  Location: [facility] INVASIVE CV LAB;  Service: Cardiovascular;  Laterality: N/A;   CARDIAC CATHETERIZATION N/A 07/16/2015   Procedure: Coronary Stent Intervention;  Surgeon: Lyn Records, MD;  Location: [facility] INVASIVE CV LAB;  Service: Cardiovascular;  Laterality: N/A;   COLONOSCOPY N/A 02/17/2021   Procedure: COLONOSCOPY;  Surgeon: Midge Minium, MD;  Location: [facility] ENDOSCOPY;  Service: Endoscopy;  Laterality: N/A;   CORONARY ANGIOPLASTY WITH STENT PLACEMENT     "6 stents before  05/2015"   KNEE ARTHROSCOPY Left 1990's   TUBAL LIGATION  ~ 1978     Current Outpatient Medications  Medication Sig Dispense Refill   amLODipine (NORVASC) 10 MG tablet Take 1 tablet (10 mg total) by mouth daily. 90 tablet 4   aspirin EC 81 MG tablet Take 81 mg by mouth daily.     cholecalciferol (VITAMIN D3) 25 MCG (1000 UNIT) tablet Take 1,000 Units by mouth daily.     clopidogrel (PLAVIX) 75 MG tablet Take 1 tablet by mouth once daily 90 tablet 4   diltiazem (CARDIZEM) 30 MG tablet Take 1 tablet (30 mg total) by mouth as needed. 30 tablet 3   hydrochlorothiazide (HYDRODIURIL) 25 MG tablet Take 1 tablet (25 mg total) by mouth  daily. 90 tablet 4   isosorbide mononitrate (IMDUR) 60 MG 24 hr tablet Take 1 tablet (60 mg total) by mouth daily. 90 tablet 4   lidocaine (LIDODERM) 5 % Place 1 patch onto the skin daily. Remove & Discard patch within 12 hours or as directed by MD 30 patch 5   losartan (COZAAR) 100 MG tablet Take 1 tablet (100 mg total) by mouth daily. 90 tablet 4   nitroGLYCERIN (NITROSTAT) 0.4 MG SL tablet DISSOLVE ONE TABLET UNDER THE TONGUE EVERY 5 MINUTES AS NEEDED FOR CHEST PAIN.  DO NOT EXCEED A TOTAL OF 3 DOSES IN 15 MINUTES 25 tablet 0   rosuvastatin (CRESTOR) 40 MG tablet Take 1 tablet (40 mg total) by mouth daily. 90 tablet 4   spironolactone (ALDACTONE) 25 MG tablet Take 1 tablet (25 mg total) by mouth every other day. 45 tablet 4   UNABLE TO FIND Neo-Cell     No current facility-administered medications for this visit.    Allergies:   Brilinta [ticagrelor], Lipitor [atorvastatin], Latex, Penicillins, and Tekturna [aliskiren]    Social History:  The patient  reports that she has never smoked. She has never used smokeless tobacco. She reports that she does not drink alcohol and does not use drugs.   Family History:  The patient's family history includes Healthy in her brother and mother; Kidney disease in her father; Prostate cancer in her father.    ROS:  Please see the history of present illness.   Otherwise, review of systems are positive for none.   All other systems are reviewed and negative.    PHYSICAL EXAM: VS:  BP (!) 140/100 (BP Location: Left Arm, Patient Position: Sitting, Cuff Size: Normal)   Pulse 69   Ht 5' 2.5" (1.588 m)   Wt 234 lb (106.1 kg)   LMP  (LMP Unknown)   SpO2 98%   BMI 42.12 kg/m  , BMI Body mass index is 42.12 kg/m. GEN: Well nourished, well developed, in no acute distress  HEENT: normal  Neck: no JVD, carotid bruits, or masses Cardiac: RRR; no murmurs, rubs, or gallops,no edema  Respiratory:  clear to auscultation bilaterally, normal work of  breathing GI: soft, nontender, nondistended, + BS MS: no deformity or atrophy  Skin: warm and dry, no rash Neuro:  Strength and sensation are intact Psych: euthymic mood, full affect   EKG:  EKG is ordered today. The ekg ordered today demonstrates : Sinus rhythm with occasional PVCs and nonspecific T wave changes.   Recent Labs: 07/04/2022: Hemoglobin 14.6; Platelets 230 08/06/2022: Magnesium 2.3; TSH 2.580 12/07/2022: ALT 18; BUN 10; Creatinine, Ser 0.79; Potassium 3.8; Sodium 137    Lipid Panel    Component Value Date/Time   CHOL  144 12/07/2022 1359   CHOL 206 (H) 12/28/2017 1356   TRIG 89 12/07/2022 1359   TRIG 136 12/28/2017 1356   HDL 41 12/07/2022 1359   CHOLHDL 3.8 06/23/2022 0504   VLDL 13 06/23/2022 0504   VLDL 27 12/28/2017 1356   LDLCALC 86 12/07/2022 1359      Wt Readings from Last 3 Encounters:  01/27/23 234 lb (106.1 kg)  01/21/23 233 lb (105.7 kg)  01/14/23 235 lb 11.2 oz (106.9 kg)           No data to display            ASSESSMENT AND PLAN:  1.  Coronary artery disease involving native coronary arteries without angina: She is doing well overall with no anginal symptoms.  She continues to be on dual antiplatelet therapy.  2. Palpitations: She has diltiazem to be used as needed.  She is supposed to be on Toprol 25 mg once daily but it seems that she did not tolerate the medication according to the most recent office note from [facility].  We could consider carvedilol if blood pressure remains elevated.  3. Essential hypertension: Her blood pressure is elevated today but she has not taken her morning blood pressure medications.  4. Hyperlipidemia: She is on maximal dose rosuvastatin 40 mg once daily.  Most recent lipid profile showed an LDL of 86.  If LDL remains above 70, will consider adding ezetimibe or Nexlizet.  5. Chronic diastolic heart failure: She appears to be euvolemic.  6. 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  This is likely only  borderline dilated considering her age and BSA.    Disposition:   FU with me in 6 months  Signed,  Lorine Bears, MD  01/27/2023 9:16 AM    [facility]

## 2023-01-27 NOTE — Patient Instructions (Signed)
Medication Instructions:  No changes *If you need a refill on your cardiac medications before your next appointment, please call your pharmacy*   Lab Work: None ordered If you have labs (blood work) drawn today and your tests are completely normal, you will receive your results only by: MyChart Message (if you have MyChart) OR A paper copy in the mail If you have any lab test that is abnormal or we need to change your treatment, we will call you to review the results.   Testing/Procedures: None ordered   Follow-Up: At [facility], you and your health needs are our priority.  As part of our continuing mission to provide you with exceptional heart care, we have created designated Provider Care Teams.  These Care Teams include your primary Cardiologist (physician) and Advanced Practice Providers (APPs -  Physician Assistants and Nurse Practitioners) who all work together to provide you with the care you need, when you need it.  We recommend signing up for the patient portal called "MyChart".  Sign up information is provided on this After Visit Summary.  MyChart is used to connect with patients for Virtual Visits (Telemedicine).  Patients are able to view lab/test results, encounter notes, upcoming appointments, etc.  Non-urgent messages can be sent to your provider as well.   To learn more about what you can do with MyChart, go to [email or web address].    Your next appointment:   6 month(s)  Provider:   You may see Dr. Arida or one of the following Advanced Practice Providers on your designated Care Team:   Christopher Berge, NP Ryan Dunn, PA-C Cadence Furth, PA-C Sheri Hammock, NP    

## 2023-02-16 ENCOUNTER — Ambulatory Visit: Payer: Self-pay | Admitting: *Deleted

## 2023-02-16 NOTE — Patient Outreach (Signed)
  Care Coordination   Follow Up Visit Note   02/17/2023 Name: RYCHELLE MUTCHLER MRN: [id number] DOB: Sep 24, 1950  DEZLYNN KUHNKE is a 72 y.o. year old female who sees [place], Corrie Dandy T, NP for primary care. I spoke with  [name] by phone today.  What matters to the patients health and wellness today?  Have successful surgery    Goals Addressed             This Visit's Progress    Management of chronic pain   On track    Care Coordination Interventions: Reviewed provider established plan for pain management Discussed importance of adherence to all scheduled medical appointments Counseled on the importance of reporting any/all new or changed pain symptoms or management strategies to pain management provider Discussed use of relaxation techniques and/or diversional activities to assist with pain reduction (distraction, imagery, relaxation, massage, acupressure, TENS, heat, and cold application Reviewed with patient prescribed pharmacological and nonpharmacological pain relief strategies          SDOH assessments and interventions completed:  No     Care Coordination Interventions:  Yes, provided   Interventions Today    Flowsheet Row Most Recent Value  Chronic Disease   Chronic disease during today's visit Other  [need for hysterctomy]  General Interventions   General Interventions Discussed/Reviewed General Interventions Reviewed, Doctor Visits  Doctor Visits Discussed/Reviewed Doctor Visits Reviewed, Specialist  [has surgery date scheduled for 7/22]  PCP/Specialist Visits Compliance with follow-up visit       Follow up plan: Follow up call scheduled for 7/25    Encounter Outcome:  Pt. Visit Completed   Kemper Durie, RN, MSN, [facility] Care Management Care Management Coordinator [phone]

## 2023-02-18 DIAGNOSIS — I441 Atrioventricular block, second degree: Secondary | ICD-10-CM

## 2023-02-18 HISTORY — DX: Atrioventricular block, second degree: I44.1

## 2023-02-21 ENCOUNTER — Encounter: Payer: Self-pay | Admitting: Obstetrics and Gynecology

## 2023-02-23 ENCOUNTER — Ambulatory Visit: Payer: Medicare Other | Admitting: Cardiology

## 2023-02-24 ENCOUNTER — Other Ambulatory Visit: Payer: 59

## 2023-02-28 NOTE — Progress Notes (Deleted)
@CHLAVSLOGO @   GYNECOLOGY PREOPERATIVE HISTORY AND PHYSICAL   Subjective:  Jennifer Grant is a 72 y.o. [id number] here for surgical management of TOTAL VAGINAL HYSTERECTOMY WITH BSO ANTERIOR AND POSTERIOR  REPAIR .   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 No significant preoperative concerns.  Proposed surgery: TOTAL VAGINAL HYSTERECTOMY WITH BSO ANTERIOR AND POSTERIOR  REPAIR    Pertinent Gynecological History: Menses: {menses:16152} Bleeding: {uterine bleeding:32112} Contraception: none Last mammogram: normal Date: 06/15/2022 Last pap:  Unknown  Date: ***   Past Medical History:  Diagnosis Date   CAD (coronary artery disease)    a. 2004 s/p MI w/ PCI/stenting to the LCX, OM1, OM2, RCA, RPDA; b. 05/2015 MI/PCI: 100 OM1 (2.75x20 & 3.0x8 Promus Premier DES'), PTCA OM2; c. 07/2015 Cath/Staged PCI: LM 60, LAD 75d, LCX 62m ISR, OM1 ok, OM2 25 ISR, RCA 50p, 47m/d, 85d (3.0x28 Promus Premier DES).   COVID 2 months ago   Diastolic dysfunction    a. 05/2015 Echo: EF 50-55%, severe lateral HK. Gr1 DD, mildly dil LA. Mildly reduced RV fxn.   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    "got booster shot a few times"   GERD (gastroesophageal reflux disease)    Hyperlipidemia    Hypertensive heart disease    Menopausal state    Morbid obesity (HCC)    Noncompliance    Sleep apnea    SVT (supraventricular tachycardia) 12/04/2021   Monitor 08/2022: NSR, PSVT (HR > 150 x 54 mins), PVC 2.7%   Past Surgical History:  Procedure Laterality Date   CARDIAC CATHETERIZATION N/A 06/08/2015   Procedure: Left Heart Cath and Coronary Angiography;  Surgeon: Lyn Records, MD;  Location: [facility] INVASIVE CV LAB;  Service: Cardiovascular;  Laterality: N/A;   CARDIAC CATHETERIZATION N/A 06/08/2015   Procedure: Coronary Stent Intervention;  Surgeon: Lyn Records, MD;  Location: [facility] INVASIVE CV LAB;  Service: Cardiovascular;  Laterality: N/A;   CARDIAC  CATHETERIZATION N/A 07/16/2015   Procedure: Coronary Stent Intervention;  Surgeon: Lyn Records, MD;  Location: [facility] INVASIVE CV LAB;  Service: Cardiovascular;  Laterality: N/A;   COLONOSCOPY N/A 02/17/2021   Procedure: COLONOSCOPY;  Surgeon: Midge Minium, MD;  Location: [facility] ENDOSCOPY;  Service: Endoscopy;  Laterality: N/A;   CORONARY ANGIOPLASTY WITH STENT PLACEMENT     "6 stents before 05/2015"   KNEE ARTHROSCOPY Left 1990's   TUBAL LIGATION  ~ 1978   OB History  Gravida Para Term Preterm AB Living  5 4 4   1 4   SAB IAB Ectopic Multiple Live Births  1       4    # Outcome Date GA Lbr Len/2nd Weight Sex Delivery Anes PTL Lv  5 SAB 1975          4 Term 71    F Vag-Spont   LIV  3 Term 1968    F Vag-Spont   LIV  2 Term 1966    M Vag-Spont   LIV  1 Term 1964    M Vag-Spont   LIV  Patient denies any other pertinent gynecologic issues.  Family History  Problem Relation Age of Onset   Healthy Mother    Prostate cancer Father    Kidney disease Father    Healthy Brother    Cancer Neg Hx    Diabetes Neg Hx    Heart disease Neg Hx    Social History   Socioeconomic History   Marital status: Widowed  Spouse name: Not on file   Number of children: Not on file   Years of education: Not on file   Highest education level: 11th grade  Occupational History   Occupation: retired  Tobacco Use   Smoking status: Never   Smokeless tobacco: Never  Vaping Use   Vaping Use: Never used  Substance and Sexual Activity   Alcohol use: No   Drug use: No   Sexual activity: Not Currently    Birth control/protection: Surgical  Other Topics Concern   Not on file  Social History Narrative   Not on file   Social Determinants of Health   Financial Resource Strain: Medium Risk (05/04/2022)   Overall Financial Resource Strain (CARDIA)    Difficulty of Paying Living Expenses: Somewhat hard  Food Insecurity: Food Insecurity Present (07/28/2022)   Hunger Vital Sign    Worried About Running Out  of Food in the Last Year: Sometimes true    Ran Out of Food in the Last Year: Sometimes true  Transportation Needs: No Transportation Needs (07/28/2022)   PRAPARE - Administrator, Civil Service (Medical): No    Lack of Transportation (Non-Medical): No  Physical Activity: Inactive (05/04/2022)   Exercise Vital Sign    Days of Exercise per Week: 0 days    Minutes of Exercise per Session: 0 min  Stress: No Stress Concern Present (05/04/2022)   [employer] of Occupational Health - Occupational Stress Questionnaire    Feeling of Stress : Not at all  Social Connections: Moderately Isolated (05/04/2022)   Social Connection and Isolation Panel [NHANES]    Frequency of Communication with Friends and Family: Three times a week    Frequency of Social Gatherings with Friends and Family: Three times a week    Attends Religious Services: More than 4 times per year    Active Member of Clubs or Organizations: No    Attends Banker Meetings: Never    Marital Status: Widowed  Intimate Partner Violence: Not At Risk (05/04/2022)   Humiliation, Afraid, Rape, and Kick questionnaire    Fear of Current or Ex-Partner: No    Emotionally Abused: No    Physically Abused: No    Sexually Abused: No   Current Outpatient Medications on File Prior to Visit  Medication Sig Dispense Refill   amLODipine (NORVASC) 10 MG tablet Take 1 tablet (10 mg total) by mouth daily. 90 tablet 4   aspirin EC 81 MG tablet Take 81 mg by mouth daily.     cholecalciferol (VITAMIN D3) 25 MCG (1000 UNIT) tablet Take 1,000 Units by mouth daily.     clopidogrel (PLAVIX) 75 MG tablet Take 1 tablet by mouth once daily 90 tablet 4   diltiazem (CARDIZEM) 30 MG tablet Take 1 tablet (30 mg total) by mouth as needed. 30 tablet 3   hydrochlorothiazide (HYDRODIURIL) 25 MG tablet Take 1 tablet (25 mg total) by mouth daily. 90 tablet 4   isosorbide mononitrate (IMDUR) 60 MG 24 hr tablet Take 1 tablet (60 mg total) by mouth  daily. 90 tablet 4   lidocaine (LIDODERM) 5 % Place 1 patch onto the skin daily. Remove & Discard patch within 12 hours or as directed by MD 30 patch 5   losartan (COZAAR) 100 MG tablet Take 1 tablet (100 mg total) by mouth daily. 90 tablet 4   nitroGLYCERIN (NITROSTAT) 0.4 MG SL tablet DISSOLVE ONE TABLET UNDER THE TONGUE EVERY 5 MINUTES AS NEEDED FOR CHEST PAIN.  DO  NOT EXCEED A TOTAL OF 3 DOSES IN 15 MINUTES 25 tablet 0   rosuvastatin (CRESTOR) 40 MG tablet Take 1 tablet (40 mg total) by mouth daily. 90 tablet 4   spironolactone (ALDACTONE) 25 MG tablet Take 1 tablet (25 mg total) by mouth every other day. 45 tablet 4   UNABLE TO FIND Neo-Cell     No current facility-administered medications on file prior to visit.   Allergies  Allergen Reactions   Brilinta [Ticagrelor] Shortness Of Breath   Lipitor [Atorvastatin] Itching    Mouth itching, cough   Latex Other (See Comments)    Gloves make hands look black after prolonged use   Penicillins Itching and Swelling    Tongue itching and lip swelling Has patient had a PCN reaction causing immediate rash, facial/tongue/throat swelling, SOB or lightheadedness with hypotension: yes Has patient had a PCN reaction causing severe rash involving mucus membranes or skin necrosis: No Has patient had a PCN reaction that required hospitalization No Has patient had a PCN reaction occurring within the last 10 years: No If all of the above answers are "NO", then may proceed with Cephalosporin use.   Tekturna [Aliskiren] Rash      Review of Systems Constitutional: No recent fever/chills/sweats Respiratory: No recent cough/bronchitis Cardiovascular: No chest pain Gastrointestinal: No recent nausea/vomiting/diarrhea Genitourinary: No UTI symptoms Hematologic/lymphatic:No history of coagulopathy or recent blood thinner use    Objective:   There were no vitals taken for this visit. CONSTITUTIONAL: Well-developed, well-nourished female in no acute  distress.  HENT:  Normocephalic, atraumatic, External right and left ear normal. Oropharynx is clear and moist EYES: Conjunctivae and EOM are normal. Pupils are equal, round, and reactive to light. No scleral icterus.  NECK: Normal range of motion, supple, no masses SKIN: Skin is warm and dry. No rash noted. Not diaphoretic. No erythema. No pallor. NEUROLOGIC: Alert and oriented to person, place, and time. Normal reflexes, muscle tone coordination. No cranial nerve deficit noted. PSYCHIATRIC: Normal mood and affect. Normal behavior. Normal judgment and thought content. CARDIOVASCULAR: Normal heart rate noted, regular rhythm RESPIRATORY: Effort and breath sounds normal, no problems with respiration noted ABDOMEN: Soft, nontender, nondistended. PELVIC: Deferred MUSCULOSKELETAL: Normal range of motion. No edema and no tenderness. 2+ distal pulses.    Labs: No results found for this or any previous visit (from the past 336 hour(s)).   Imaging Studies: No results found.  Assessment:     ***      Plan:    Counseling: Procedure, risks, reasons, benefits and complications (including injury to bowel, bladder, major blood vessel, ureter, bleeding, possibility of transfusion, infection, or fistula formation) reviewed in detail. Likelihood of success in alleviating the patient's condition was discussed. Routine postoperative instructions will be reviewed with the patient and her family in detail after surgery.  The patient concurred with the proposed plan, giving informed written consent for the surgery.   Preop testing ordered. Instructions reviewed, including NPO after midnight.      Hildred Laser, MD [facility]

## 2023-03-01 ENCOUNTER — Encounter: Payer: 59 | Admitting: Obstetrics and Gynecology

## 2023-03-01 ENCOUNTER — Telehealth: Payer: Self-pay | Admitting: Obstetrics and Gynecology

## 2023-03-01 DIAGNOSIS — N812 Incomplete uterovaginal prolapse: Secondary | ICD-10-CM

## 2023-03-01 DIAGNOSIS — R102 Pelvic and perineal pain: Secondary | ICD-10-CM

## 2023-03-01 NOTE — Telephone Encounter (Signed)
Arlisha reached out to the pt to reschedule the pre-op appt that was scheduled for 03/01/2023 at 1:35.  Appt was rescheduled on 03/08/2023 at 11:15 with Dr. Valentino Saxon.

## 2023-03-08 ENCOUNTER — Encounter: Payer: Self-pay | Admitting: Obstetrics and Gynecology

## 2023-03-08 ENCOUNTER — Ambulatory Visit (INDEPENDENT_AMBULATORY_CARE_PROVIDER_SITE_OTHER): Payer: 59 | Admitting: Obstetrics and Gynecology

## 2023-03-08 VITALS — BP 151/89 | HR 78 | Ht 63.0 in | Wt 229.0 lb

## 2023-03-08 DIAGNOSIS — R102 Pelvic and perineal pain: Secondary | ICD-10-CM

## 2023-03-08 DIAGNOSIS — I10 Essential (primary) hypertension: Secondary | ICD-10-CM

## 2023-03-08 DIAGNOSIS — I25118 Atherosclerotic heart disease of native coronary artery with other forms of angina pectoris: Secondary | ICD-10-CM

## 2023-03-08 DIAGNOSIS — N812 Incomplete uterovaginal prolapse: Secondary | ICD-10-CM

## 2023-03-08 NOTE — Progress Notes (Signed)
GYNECOLOGY PREOPERATIVE HISTORY AND PHYSICAL   Subjective:  Jennifer Grant is a 72 y.o. O1H0865 here for surgical management of  pelvic organ prolapse. (Grade 3 cystocele, Grade 1-2 rectocele, and incomplete uterine prolapse). Patient has attempted to manage with pessary several times over the past few years, however noted difficulty with passing urine and pelvic pressure/back pain. Now desires definitive management.  Her daughter is present with her today for today's visit.     Proposed surgery: TOTAL VAGINAL HYSTERECTOMY WITH BSO  ANTERIOR AND POSTERIOR  REPAIR    Pertinent Gynecological History: Menses: post-menopausal Contraception: post menopausal status Last mammogram: normal Date: 06/15/2022 Last pap: not indicated.    Past Medical History:  Diagnosis Date   CAD (coronary artery disease)    a. 2004 s/p MI w/ PCI/stenting to the LCX, OM1, OM2, RCA, RPDA; b. 05/2015 MI/PCI: 100 OM1 (2.75x20 & 3.0x8 Promus Premier DES'), PTCA OM2; c. 07/2015 Cath/Staged PCI: LM 60, LAD 75d, LCX 59m ISR, OM1 ok, OM2 25 ISR, RCA 50p, 17m/d, 85d (3.0x28 Promus Premier DES).   COVID 2 months ago   Diastolic dysfunction    a. 05/2015 Echo: EF 50-55%, severe lateral HK. Gr1 DD, mildly dil LA. Mildly reduced RV fxn.   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    "got booster shot a few times"   GERD (gastroesophageal reflux disease)    Hyperlipidemia    Hypertensive heart disease    Menopausal state    Morbid obesity (HCC)    Noncompliance    Sleep apnea    SVT (supraventricular tachycardia) 12/04/2021   Monitor 08/2022: NSR, PSVT (HR > 150 x 54 mins), PVC 2.7%    Past Surgical History:  Procedure Laterality Date   CARDIAC CATHETERIZATION N/A 06/08/2015   Procedure: Left Heart Cath and Coronary Angiography;  Surgeon: Lyn Records, MD;  Location: [facility] INVASIVE CV LAB;  Service: Cardiovascular;  Laterality: N/A;   CARDIAC CATHETERIZATION N/A 06/08/2015   Procedure: Coronary Stent Intervention;  Surgeon: Lyn Records,  MD;  Location: [facility] INVASIVE CV LAB;  Service: Cardiovascular;  Laterality: N/A;   CARDIAC CATHETERIZATION N/A 07/16/2015   Procedure: Coronary Stent Intervention;  Surgeon: Lyn Records, MD;  Location: [facility] INVASIVE CV LAB;  Service: Cardiovascular;  Laterality: N/A;   COLONOSCOPY N/A 02/17/2021   Procedure: COLONOSCOPY;  Surgeon: Midge Minium, MD;  Location: [facility] ENDOSCOPY;  Service: Endoscopy;  Laterality: N/A;   CORONARY ANGIOPLASTY WITH STENT PLACEMENT     "6 stents before 05/2015"   KNEE ARTHROSCOPY Left 1990's   TUBAL LIGATION  ~ 1978    OB History  Gravida Para Term Preterm AB Living  5 4 4   1 4   SAB IAB Ectopic Multiple Live Births  1       4    # Outcome Date GA Lbr Len/2nd Weight Sex Delivery Anes PTL Lv  5 SAB 1975          4 Term 24    F Vag-Spont   LIV  3 Term 1968    F Vag-Spont   LIV  2 Term 1966    M Vag-Spont   LIV  1 Term 1964    M Vag-Spont   LIV    Family History  Problem Relation Age of Onset   Healthy Mother    Prostate cancer Father    Kidney disease Father    Healthy Brother    Cancer Neg Hx    Diabetes Neg Hx    Heart disease  Neg Hx     Social History   Socioeconomic History   Marital status: Widowed    Spouse name: Not on file   Number of children: Not on file   Years of education: Not on file   Highest education level: 11th grade  Occupational History   Occupation: retired  Tobacco Use   Smoking status: Never   Smokeless tobacco: Never  Vaping Use   Vaping Use: Never used  Substance and Sexual Activity   Alcohol use: No   Drug use: No   Sexual activity: Not Currently    Birth control/protection: Surgical  Other Topics Concern   Not on file  Social History Narrative   Not on file   Social Determinants of Health   Financial Resource Strain: Medium Risk (05/04/2022)   Overall Financial Resource Strain (CARDIA)    Difficulty of Paying Living Expenses: Somewhat hard  Food Insecurity: Food Insecurity Present (07/28/2022)   Hunger  Vital Sign    Worried About Running Out of Food in the Last Year: Sometimes true    Ran Out of Food in the Last Year: Sometimes true  Transportation Needs: No Transportation Needs (07/28/2022)   PRAPARE - Administrator, Civil Service (Medical): No    Lack of Transportation (Non-Medical): No  Physical Activity: Inactive (05/04/2022)   Exercise Vital Sign    Days of Exercise per Week: 0 days    Minutes of Exercise per Session: 0 min  Stress: No Stress Concern Present (05/04/2022)   [employer] of Occupational Health - Occupational Stress Questionnaire    Feeling of Stress : Not at all  Social Connections: Moderately Isolated (05/04/2022)   Social Connection and Isolation Panel [NHANES]    Frequency of Communication with Friends and Family: Three times a week    Frequency of Social Gatherings with Friends and Family: Three times a week    Attends Religious Services: More than 4 times per year    Active Member of Clubs or Organizations: No    Attends Banker Meetings: Never    Marital Status: Widowed  Intimate Partner Violence: Not At Risk (05/04/2022)   Humiliation, Afraid, Rape, and Kick questionnaire    Fear of Current or Ex-Partner: No    Emotionally Abused: No    Physically Abused: No    Sexually Abused: No    Current Outpatient Medications on File Prior to Visit  Medication Sig Dispense Refill   amLODipine (NORVASC) 10 MG tablet Take 1 tablet (10 mg total) by mouth daily. 90 tablet 4   aspirin EC 81 MG tablet Take 81 mg by mouth daily.     cholecalciferol (VITAMIN D3) 25 MCG (1000 UNIT) tablet Take 1,000 Units by mouth daily.     clopidogrel (PLAVIX) 75 MG tablet Take 1 tablet by mouth once daily 90 tablet 4   diltiazem (CARDIZEM) 30 MG tablet Take 1 tablet (30 mg total) by mouth as needed. 30 tablet 3   hydrochlorothiazide (HYDRODIURIL) 25 MG tablet Take 1 tablet (25 mg total) by mouth daily. 90 tablet 4   isosorbide mononitrate (IMDUR) 60 MG 24 hr  tablet Take 1 tablet (60 mg total) by mouth daily. 90 tablet 4   lidocaine (LIDODERM) 5 % Place 1 patch onto the skin daily. Remove & Discard patch within 12 hours or as directed by MD 30 patch 5   losartan (COZAAR) 100 MG tablet Take 1 tablet (100 mg total) by mouth daily. 90 tablet 4   nitroGLYCERIN (  NITROSTAT) 0.4 MG SL tablet DISSOLVE ONE TABLET UNDER THE TONGUE EVERY 5 MINUTES AS NEEDED FOR CHEST PAIN.  DO NOT EXCEED A TOTAL OF 3 DOSES IN 15 MINUTES 25 tablet 0   rosuvastatin (CRESTOR) 40 MG tablet Take 1 tablet (40 mg total) by mouth daily. 90 tablet 4   spironolactone (ALDACTONE) 25 MG tablet Take 1 tablet (25 mg total) by mouth every other day. 45 tablet 4   UNABLE TO FIND Neo-Cell     No current facility-administered medications on file prior to visit.   Allergies  Allergen Reactions   Brilinta [Ticagrelor] Shortness Of Breath   Lipitor [Atorvastatin] Itching    Mouth itching, cough   Latex Other (See Comments)    Gloves make hands look black after prolonged use   Penicillins Itching and Swelling    Tongue itching and lip swelling Has patient had a PCN reaction causing immediate rash, facial/tongue/throat swelling, SOB or lightheadedness with hypotension: yes Has patient had a PCN reaction causing severe rash involving mucus membranes or skin necrosis: No Has patient had a PCN reaction that required hospitalization No Has patient had a PCN reaction occurring within the last 10 years: No If all of the above answers are "NO", then may proceed with Cephalosporin use.   Tekturna [Aliskiren] Rash     Review of Systems Constitutional: No recent fever/chills/sweats Respiratory: No recent cough/bronchitis Cardiovascular: No chest pain Gastrointestinal: No recent nausea/vomiting/diarrhea Genitourinary: No UTI symptoms Hematologic/lymphatic:No history of coagulopathy or recent blood thinner use    Objective:   Blood pressure (!) 151/89, pulse 78, height 5\' 3"  (1.6 m), weight 229  lb (103.9 kg). .Body mass index is 40.57 kg/m.  CONSTITUTIONAL: Well-developed, well-nourished female in no acute distress.  HENT:  Normocephalic, atraumatic, External right and left ear normal. Oropharynx is clear and moist EYES: Conjunctivae and EOM are normal. Pupils are equal, round, and reactive to light. No scleral icterus.  NECK: Normal range of motion, supple, no masses SKIN: Skin is warm and dry. No rash noted. Not diaphoretic. No erythema. No pallor. NEUROLOGIC: Alert and oriented to person, place, and time. Normal reflexes, muscle tone coordination. No cranial nerve deficit noted. PSYCHIATRIC: Normal mood and affect. Normal behavior. Normal judgment and thought content. CARDIOVASCULAR: Normal heart rate noted, regular rhythm RESPIRATORY: Effort and breath sounds normal, no problems with respiration noted ABDOMEN: Soft, nontender, nondistended. PELVIC: Deferred MUSCULOSKELETAL: Normal range of motion. No edema and no tenderness. 2+ distal pulses.    Labs: No results found for this or any previous visit (from the past 336 hour(s)).   Imaging Studies: No results found.  Assessment:    1. 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 Pelvic pain   3. Coronary artery disease of native artery of native heart with stable angina pectoris (HCC)   4. Essential hypertension     Plan:   - Patient desires definitive management with hysterectomy.  I proposed doing a total vaginal hysterectomy (TVH) and  bilateral salpingo-oophorectomy, with anterior/posterior repair.  Patient agrees with this proposed surgery.  The risks of surgery were discussed in detail with the patient including but not limited to: bleeding which may require transfusion or reoperation; infection which may require antibiotics; injury to bowel, bladder, ureters or other surrounding organs; need for additional procedures including laparotomy or subsequent procedures secondary to abnormal  pathology; formation of adhesions; thromboembolic phenomenon; incisional problems and other postoperative/anesthesia complications.  Patient was also advised that she will remain in house for 1  night; and expected recovery time after a hysterectomy is 6-8 weeks.  Patient was told that the likelihood that her condition and symptoms will be treated effectively with this surgical management was very high; the postoperative expectations were also discussed in detail. The patient also understands the alternative treatment options which were discussed in full. All questions were answered.  She was told that she will be contacted by our surgical scheduler regarding the time and date of her surgery; routine preoperative instructions will be given to her by the preoperative nursing team. Routine postoperative instructions will be reviewed with the patient in detail after surgery.  Surgery initially scheduled for 7/22 however will now move to 8/5.  - Has been seen by her Cardiologist recently for h/o CAD and HTN. No concerns.  - Preop testing ordered. - Instructions reviewed, including NPO after midnight.     Hildred Laser, MD [facility] at [facility]

## 2023-03-08 NOTE — Patient Instructions (Addendum)
GYNECOLOGY PRE-OPERATIVE INSTRUCTIONS   You are scheduled for surgery on 04/04/2023.  The name of your procedure is: Total Vaginal Hysterectomy with Bilateral Salpingo-oophorectomy, anterior//posterior repair.   Please read through these instructions carefully regarding preparation for your surgery: Nothing to eat after midnight on the day prior to surgery.  Do not take any medications unless recommended by your provider on day prior to surgery.  Do not take NSAIDs (Motrin, Aleve) or aspirin 7 days prior to surgery.  You may take Tylenol products for minor aches and pains.  You will receive a prescription for pain medications post-operatively.  You will need to follow with your Cardiologist for medical clearance.  Please call the office if you have any questions regarding your upcoming surgery.     Thank you for choosing [facility] at [facility].

## 2023-03-10 ENCOUNTER — Other Ambulatory Visit: Payer: 59

## 2023-03-15 ENCOUNTER — Encounter: Payer: 59 | Admitting: Obstetrics and Gynecology

## 2023-03-16 ENCOUNTER — Encounter: Payer: Self-pay | Admitting: Obstetrics and Gynecology

## 2023-03-24 ENCOUNTER — Ambulatory Visit: Payer: Self-pay | Admitting: *Deleted

## 2023-03-24 NOTE — Patient Outreach (Signed)
  Care Coordination   03/24/2023 Name: BEUNA BOLDING MRN: [id number] DOB: 07/06/1951   Care Coordination Outreach Attempts:  An unsuccessful telephone outreach was attempted for a scheduled appointment today.  Follow Up Plan:  Additional outreach attempts will be made to offer the patient care coordination information and services.   Encounter Outcome:  No Answer   Care Coordination Interventions:  No, not indicated    Kemper Durie, RN, MSN, [facility] Care Management Care Management Coordinator [phone]

## 2023-03-25 ENCOUNTER — Telehealth: Payer: Self-pay

## 2023-03-25 ENCOUNTER — Encounter
Admission: RE | Admit: 2023-03-25 | Discharge: 2023-03-25 | Disposition: A | Discharge status: Home / Self Care | Payer: 59 | Source: Ambulatory Visit | Attending: Obstetrics and Gynecology | Admitting: Obstetrics and Gynecology

## 2023-03-25 HISTORY — DX: Unspecified diastolic (congestive) heart failure: I50.30

## 2023-03-25 HISTORY — DX: Anemia, unspecified: D64.9

## 2023-03-25 HISTORY — DX: Vitamin D deficiency, unspecified: E55.9

## 2023-03-25 HISTORY — DX: Ventricular premature depolarization: I49.3

## 2023-03-25 HISTORY — DX: Personal history of colon polyps, unspecified: Z86.0100

## 2023-03-25 HISTORY — DX: Other complications of anesthesia, initial encounter: T88.59XA

## 2023-03-25 HISTORY — DX: Thoracic aortic ectasia: I77.810

## 2023-03-25 HISTORY — DX: Other abnormal glucose: R73.09

## 2023-03-25 HISTORY — DX: Atherosclerosis of aorta: I70.0

## 2023-03-25 HISTORY — DX: Paroxysmal atrial fibrillation: I48.0

## 2023-03-25 HISTORY — DX: Prediabetes: R73.03

## 2023-03-25 HISTORY — DX: Patient's other noncompliance with medication regimen for other reason: Z91.148

## 2023-03-25 HISTORY — DX: Cystocele, unspecified: N81.10

## 2023-03-25 HISTORY — DX: Other chronic pain: G89.29

## 2023-03-25 HISTORY — DX: Transient cerebral ischemic attack, unspecified: G45.9

## 2023-03-25 HISTORY — DX: Personal history of colonic polyps: Z86.010

## 2023-03-25 NOTE — Patient Instructions (Addendum)
Your procedure is scheduled on:04-04-23 Monday Report to the Registration Desk on the 1st floor of the Medical Mall.Then proceed to the 2nd floor Surgery Desk To find out your arrival time, please call [phone] between 1PM - 3PM on:04-01-23 Friday If your arrival time is 6:00 am, do not arrive before that time as the Medical Mall entrance doors do not open until 6:00 am.  REMEMBER: Instructions that are not followed completely may result in serious medical risk, up to and including death; or upon the discretion of your surgeon and anesthesiologist your surgery may need to be rescheduled.  Do not eat food after midnight the night before surgery.  No gum chewing or hard candies.  You may however, drink CLEAR liquids up to 2 hours before you are scheduled to arrive for your surgery. Do not drink anything within 2 hours of your scheduled arrival time.  Clear liquids include: - water  - apple juice without pulp - gatorade (not RED colors) - black coffee or tea (Do NOT add milk or creamers to the coffee or tea) Do NOT drink anything that is not on this list.  In addition, your doctor has ordered for you to drink the provided:  Ensure Pre-Surgery Clear Carbohydrate Drink  Drinking this carbohydrate drink up to two hours before surgery helps to reduce insulin resistance and improve patient outcomes. Please complete drinking 2 hours before scheduled arrival time.  One week prior to surgery:Last dose will be on 03-27-23 Stop Anti-inflammatories (NSAIDS) such as Advil, Aleve, Ibuprofen, Motrin, Naproxen, Naprosyn and Aspirin based products such as Excedrin, Goody's Powder, BC Powder.You may however, take Tylenol if needed for pain up until the day of surgery. Stop ANY OVER THE COUNTER supplements/vitamins 7 days prior to surgery (Vitamin D3, B12, Magnesium, Fish Oil, Neo-Cell Collagen)  Continue all prescribed medications  TAKE ONLY THESE MEDICATIONS THE MORNING OF SURGERY WITH A SIP OF  WATER: -amLODipine (NORVASC)  -isosorbide mononitrate (IMDUR  -rosuvastatin (CRESTOR)   Call Dr Oretha Milch office today (03-25-23) to find out when you need to stop your 81 mg Aspirin and clopidogrel (PLAVIX)   No Alcohol for 24 hours before or after surgery.  No Smoking including e-cigarettes for 24 hours before surgery.  No chewable tobacco products for at least 6 hours before surgery.  No nicotine patches on the day of surgery.  Do not use any "recreational" drugs for at least a week (preferably 2 weeks) before your surgery.  Please be advised that the combination of cocaine and anesthesia may have negative outcomes, up to and including death. If you test positive for cocaine, your surgery will be cancelled.  On the morning of surgery brush your teeth with toothpaste and water, you may rinse your mouth with mouthwash if you wish. Do not swallow any toothpaste or mouthwash.  Do not wear jewelry, make-up, hairpins, clips or nail polish.  Do not wear lotions, powders, or perfumes.   Do not shave body hair from the neck down 48 hours before surgery.  Contact lenses, hearing aids and dentures may not be worn into surgery.  Do not bring valuables to the hospital. [facility] is not responsible for any missing/lost belongings or valuables.    Notify your doctor if there is any change in your medical condition (cold, fever, infection).  Wear comfortable clothing (specific to your surgery type) to the hospital.  After surgery, you can help prevent lung complications by doing breathing exercises.  Take deep breaths and cough every 1-2 hours.  Your doctor may order a device called an Incentive Spirometer to help you take deep breaths. When coughing or sneezing, hold a pillow firmly against your incision with both hands. This is called "splinting." Doing this helps protect your incision. It also decreases belly discomfort.  If you are being admitted to the hospital overnight, leave your  suitcase in the car. After surgery it may be brought to your room.  In case of increased patient census, it may be necessary for you, the patient, to continue your postoperative care in the Same Day Surgery department.  If you are being discharged the day of surgery, you will not be allowed to drive home. You will need a responsible individual to drive you home and stay with you for 24 hours after surgery.   If you are taking public transportation, you will need to have a responsible individual with you.  Please call the Pre-admissions Testing Dept. at [phone] if you have any questions about these instructions.  Surgery Visitation Policy:  Patients having surgery or a procedure may have two visitors.  Children under the age of 12 must have an adult with them who is not the patient.  How to Use an Incentive Spirometer  An incentive spirometer is a tool that measures how well you are filling your lungs with each breath. Learning to take long, deep breaths using this tool can help you keep your lungs clear and active. This may help to reverse or lessen your chance of developing breathing (pulmonary) problems, especially infection. You may be asked to use a spirometer: After a surgery. If you have a lung problem or a history of smoking. After a long period of time when you have been unable to move or be active. If the spirometer includes an indicator to show the highest number that you have reached, your health care provider or respiratory therapist will help you set a goal. Keep a log of your progress as told by your health care provider. What are the risks? Breathing too quickly may cause dizziness or cause you to pass out. Take your time so you do not get dizzy or light-headed. If you are in pain, you may need to take pain medicine before doing incentive spirometry. It is harder to take a deep breath if you are having pain. How to use your incentive spirometer  Sit up on the edge of  your bed or on a chair. Hold the incentive spirometer so that it is in an upright position. Before you use the spirometer, breathe out normally. Place the mouthpiece in your mouth. Make sure your lips are closed tightly around it. Breathe in slowly and as deeply as you can through your mouth, causing the piston or the ball to rise toward the top of the chamber. Hold your breath for 3-5 seconds, or for as long as possible. If the spirometer includes a coach indicator, use this to guide you in breathing. Slow down your breathing if the indicator goes above the marked areas. Remove the mouthpiece from your mouth and breathe out normally. The piston or ball will return to the bottom of the chamber. Rest for a few seconds, then repeat the steps 10 or more times. Take your time and take a few normal breaths between deep breaths so that you do not get dizzy or light-headed. Do this every 1-2 hours when you are awake. If the spirometer includes a goal marker to show the highest number you have reached (best effort), use  this as a goal to work toward during each repetition. After each set of 10 deep breaths, cough a few times. This will help to make sure that your lungs are clear. If you have an incision on your chest or abdomen from surgery, place a pillow or a rolled-up towel firmly against the incision when you cough. This can help to reduce pain while taking deep breaths and coughing. General tips When you are able to get out of bed: Walk around often. Continue to take deep breaths and cough in order to clear your lungs. Keep using the incentive spirometer until your health care provider says it is okay to stop using it. If you have been in the hospital, you may be told to keep using the spirometer at home. Contact a health care provider if: You are having difficulty using the spirometer. You have trouble using the spirometer as often as instructed. Your pain medicine is not giving enough relief for  you to use the spirometer as told. You have a fever. Get help right away if: You develop shortness of breath. You develop a cough with bloody mucus from the lungs. You have fluid or blood coming from an incision site after you cough. Summary An incentive spirometer is a tool that can help you learn to take long, deep breaths to keep your lungs clear and active. You may be asked to use a spirometer after a surgery, if you have a lung problem or a history of smoking, or if you have been inactive for a long period of time. Use your incentive spirometer as instructed every 1-2 hours while you are awake. If you have an incision on your chest or abdomen, place a pillow or a rolled-up towel firmly against your incision when you cough. This will help to reduce pain. Get help right away if you have shortness of breath, you cough up bloody mucus, or blood comes from your incision when you cough. This information is not intended to replace advice given to you by your health care provider. Make sure you discuss any questions you have with your health care provider. Document Revised: 11/05/2019 Document Reviewed: 11/05/2019 Elsevier Patient Education  2023 [employer].

## 2023-03-25 NOTE — Telephone Encounter (Signed)
Patient contacted office stating that she is scheduled for a hysterectomy on 04/04/23 and wants to know which medications she needs to stop prior to surgery? Please review chart and advise. KW

## 2023-03-25 NOTE — Pre-Procedure Instructions (Signed)
Called Jennifer Grant to do her anesthesia interview for her upcoming 04-04-23 surgery with Dr Valentino Saxon. When reviewing medications, Jennifer Grant states no one has told her when she needs to stop her Asa 81 mg and Plavix. I tried to secure chat Dr Valentino Saxon and she is off line and I was unable to ask her. I told Jennifer Grant and her daughter that they will need to call over to Dr Chalmers Guest office today (03-25-23) to find out when Jennifer Grant needs to stop these medications. Both Jennifer Grant and daughter verbalized understanding

## 2023-03-27 NOTE — Telephone Encounter (Signed)
She will need to stop her Plavix 5 days prior to surgery. All of her other medications she can take up to the evening before her surgery. Do not take aspirin or NSAIDS (Motrin, Aleve) the week before surgery, only use Tylenol products for minor aches and pains.

## 2023-03-28 ENCOUNTER — Other Ambulatory Visit: Payer: Self-pay

## 2023-03-28 ENCOUNTER — Telehealth: Payer: Self-pay | Admitting: *Deleted

## 2023-03-28 ENCOUNTER — Encounter
Admission: RE | Admit: 2023-03-28 | Discharge: 2023-03-28 | Disposition: A | Discharge status: Home / Self Care | Payer: 59 | Source: Ambulatory Visit | Attending: Obstetrics and Gynecology | Admitting: Obstetrics and Gynecology

## 2023-03-28 DIAGNOSIS — Z01818 Encounter for other preprocedural examination: Secondary | ICD-10-CM | POA: Insufficient documentation

## 2023-03-28 LAB — CBC
HCT: 43.6 % (ref 36.0–46.0)
Hemoglobin: 13.8 g/dL (ref 12.0–15.0)
MCH: 25.8 pg — ABNORMAL LOW (ref 26.0–34.0)
MCHC: 31.7 g/dL (ref 30.0–36.0)
MCV: 81.5 fL (ref 80.0–100.0)
Platelets: 222 10*3/uL (ref 150–400)
RBC: 5.35 MIL/uL — ABNORMAL HIGH (ref 3.87–5.11)
RDW: 15.3 % (ref 11.5–15.5)
WBC: 5.9 10*3/uL (ref 4.0–10.5)
nRBC: 0 % (ref 0.0–0.2)

## 2023-03-28 LAB — COMPREHENSIVE METABOLIC PANEL
ALT: 20 U/L (ref 0–44)
AST: 24 U/L (ref 15–41)
Albumin: 4.2 g/dL (ref 3.5–5.0)
Alkaline Phosphatase: 52 U/L (ref 38–126)
Anion gap: 10 (ref 5–15)
BUN: 12 mg/dL (ref 8–23)
CO2: 27 mmol/L (ref 22–32)
Calcium: 9.9 mg/dL (ref 8.9–10.3)
Chloride: 98 mmol/L (ref 98–111)
Creatinine, Ser: 0.73 mg/dL (ref 0.44–1.00)
GFR, Estimated: >60 mL/min (ref >60)
Glucose, Bld: 96 mg/dL (ref 70–99)
Potassium: 3.2 mmol/L — ABNORMAL LOW (ref 3.5–5.1)
Sodium: 135 mmol/L (ref 135–145)
Total Bilirubin: 1 mg/dL (ref 0.3–1.2)
Total Protein: 8.5 g/dL — ABNORMAL HIGH (ref 6.5–8.1)

## 2023-03-28 LAB — TYPE AND SCREEN
ABO/RH(D): AB POS
Antibody Screen: NEGATIVE

## 2023-03-28 MED ORDER — POTASSIUM CHLORIDE CRYS ER 20 MEQ PO TBCR: EXTENDED_RELEASE_TABLET | ORAL | 0 refills | Status: DC

## 2023-03-28 NOTE — Telephone Encounter (Signed)
[id number] 

## 2023-03-28 NOTE — Telephone Encounter (Signed)
-----   Message from Verlee Monte sent at 03/28/2023  4:51 PM EDT ----- Regarding: Request for pre-operative cardiac clearance Request for pre-operative cardiac clearance:  1. What type of surgery is being performed?  TOTAL VAGINAL  HYSTERECTOMY WITH BSO (Bilateral); ANTERIOR AND POSTERIOR  REPAIR  2. When is this surgery scheduled?  04/04/2023  3. Type of clearance being requested (medical, pharmacy, both)? BOTH   4. Are there any medications that need to be held prior to surgery? ASA + CLOPIDOGREL  5. Practice name and name of physician performing surgery?  Performing surgeon: Dr. Hildred Laser, MD Requesting clearance: Quentin Mulling, FNP-C    6. Anesthesia type (none, local, MAC, general)? GENERAL  7. What is the office phone and fax number?   Fax: [phone]  ATTENTION: Unable to create telephone message as per your standard workflow. Directed by [facility] providers to send requests for cardiac clearance to this pool for appropriate distribution to provider covering pre-operative clearances.   Quentin Mulling, MSN, APRN, FNP-C, CEN [facility]  Peri-operative Services Nurse Practitioner Phone: [phone] 03/28/23 4:51 PM

## 2023-03-29 ENCOUNTER — Encounter: Payer: Self-pay | Admitting: Obstetrics and Gynecology

## 2023-03-29 ENCOUNTER — Encounter: Payer: 59 | Admitting: Obstetrics and Gynecology

## 2023-03-29 NOTE — Progress Notes (Addendum)
Perioperative / Anesthesia Services  Pre-Admission Testing Clinical Review / Preoperative Anesthesia Consult  Date: 03/30/23  Patient Demographics:  Name: Jennifer Grant DOB:   05-29-1951 MRN:   [id number]  Planned Surgical Procedure(s):    Case: [id number] Date/Time: 04/04/23 0715   Procedures:      TOTAL VAGINAL  HYSTERECTOMY WITH BSO (Bilateral)     ANERIOR AND POSTERIOR  REPAIR   Anesthesia type: General   Pre-op diagnosis: Pelvic Organ Prolapse   Location: [facility] OR ROOM 05 / [facility] ORS FOR ANESTHESIA GROUP   Surgeons: Hildred Laser, MD     NOTE: Available PAT nursing documentation and vital signs have been reviewed. Clinical nursing staff has updated patient's PMH/PSHx, current medication list, and drug allergies/intolerances to ensure comprehensive history available to assist in medical decision making as it pertains to the aforementioned surgical procedure and anticipated anesthetic course. Extensive review of available clinical information personally performed. [place] [facility] and PSH[facility] updated with any diagnoses/procedures that  may have been inadvertently omitted during her intake with the pre-admission testing department's nursing staff.  Clinical Discussion:  Jennifer Grant is a 72 y.o. female who is submitted for pre-surgical anesthesia review and clearance prior to her undergoing the above procedure. Patient has never been a smoker. Pertinent PMH includes: CAD, inferior NSTEMI, lateral STEMI, HFpEF, PAF, PSVT, PVCs, second-degree heart block, ascending aorta dilatation, angina, aortic atherosclerosis, TIA, HTN, HLD, prediabetes, OSAH (not currently on nocturnal PAP therapy), GERD (no daily Tx), chronic lower back pain, cystocele without uterine prolapse, anemia, medication noncompliance.  Patient is followed by cardiology Kirke Corin, MD). She was last seen in the cardiology clinic on 01/27/2023; notes reviewed. At the time of her clinic visit, patient doing well overall from a  cardiovascular perspective. Patient denied any chest pain, shortness of breath, PND, orthopnea, palpitations, significant peripheral edema, weakness, fatigue, vertiginous symptoms, or presyncope/syncope. Patient with a past medical history significant for cardiovascular diagnoses. Documented physical exam was grossly benign, providing no evidence of acute exacerbation and/or decompensation of the patient's known cardiovascular conditions.  Patient suffered an inferior wall NSTEMI on 08/24/2003.  Diagnostic LEFT heart catheterization was performed on 08/26/2003 revealing multivessel CAD; 90% distal RCA-1, 80% distal RCA-2, 80% RPDA, 80% distal LCx, 80% OM1, 80% OM2, 50% proximal LAD, and 20% mid LAD.  Given the complexity of her coronary artery disease, patient was transferred to [facility] for ongoing care and management.  Patient underwent complex PCI at [facility] placing 2.5 x 20 mm Taxus stents x 2 to the distal RCA and a 2.5 x 16 mm Taxus stent to the RPDA.  Procedure yielded excellent angiographic result and TIMI-3 flow.  Patient with recurrent angina necessitating repeat cardiac catheterization.  Patient underwent diagnostic LEFT heart catheterization on 09/23/2003 revealing multivessel CAD; 30% proximal to mid RCA, 90% distal LCx, 80% OM1, 70% OM 2-1, 90% OM 2-2, and 30% proximal LAD.  Again given the complexity of her coronary artery disease, patient was transferred to [facility] for complex PCI.  PCI was performed at [facility] placing a 3.0 x 13 mm Cypher DES to a 95% OM1 lesion, 2.5 x 20 mm Taxus stent placed to a 95% OM2 lesion, and a 2.5 x 16 mm Taxus stent to a 95% OM3 lesion. Procedure yielded excellent angiographic result and TIMI-3.  Diagnostic LEFT heart catheterization was performed on 02/17/2009 revealing multivessel CAD; 50% distal LAD, 30% OM3, and 50% RCA.  In-stent restenosis noted of the previously placed stents to the  distal RCA (40%) and the RPLS (60%).   Interventional cardiology made the decision to defer further intervention opting for aggressive medical management.  Patient suffered a lateral STEMI on 06/08/2015.  Patient underwent diagnostic LEFT heart catheterization revealing multivessel CAD; 100% OM1, 70-90% distal RCA, 70-90% PDA, 50% mid LAD, and 75% distal LAD.  There was 99% in-stent restenosis noted in the OM2, which was felt to be the culprit lesion.  Previously placed LCx stent noted to be patent.  PCI was performed placing overlapping 2.75 x 20 mm and 3.0 x 8 mm Promus Premier stents to the OM1 lesion.  POBA was performed to the OM 2 lesion.  Procedure yielded excellent angiographic result and TIMI-3 flow.  Plans were for staged PCI of the RCA at a later date.  Patient underwent staged PCI procedure on 07/16/2015 placing a 3.0 x 28 mm Promus Premier DES x 1 to the mid RCA.  Procedure yielded excellent angiographic result and TIMI-3 flow.  Myocardial perfusion imaging study performed on 02/18/2022 revealed a mildly reduced left ventricular systolic function with a nuclear EF of 47%.  Moderately reduced uptake present in the apical to mid anterolateral, lateral, and apex locations; perfusion defect was fixed.  Abnormal wall motion noted in the defect area consistent with prior infarction.  There was no evidence of stress-induced myocardial ischemia or arrhythmia.  Study determined to be intermediate risk.  Most recent TTE was performed on 06/22/2022 revealing a normal left ventricular systolic function with an EF of >55%.  There were no regional wall motion abnormalities.  Mild LVH noted. Left ventricular diastolic Doppler parameters consistent with abnormal relaxation (G1DD).  Right ventricular size and function normal.  There was moderate mitral annular calcification.  Trivial aortic valve regurgitation observed.  All transvalvular gradients were noted to be normal providing no evidence suggestive of valvular stenosis.  Ascending aorta mildly  dilated at 42 mm.  Long-term cardiac event monitor study performed on 08/31/2022 revealing a predominant underlying sinus rhythm at an average rate of 61 bpm; range 39-245 bpm.  There were 10 runs of PSVT noted with the fastest interval lasting 54 minutes and 9 seconds at a maximum rate of 245 bpm.  Despite atrial arrhythmia, patient with no reported symptoms.  PVC burden was 2.7%.  Patient's only triggered symptomatic event was an isolated PVC.  Given patient's significant cardiovascular disease and stent placement, patient remains on daily DAPT therapy using ASA + clopidogrel.  Patient is reportedly compliant with therapy with no evidence or reports of GI bleeding.  Blood pressure uncontrolled at 140/100 mmHg on currently prescribed CCB (amlodipine), diuretic (HCTZ + spironolactone), nitrate (isosorbide mononitrate), and ARB (losartan) therapies. Patient has a as needed supply of diltiazem to use for palpitations as needed; denied recent use.  Previously had trialed patient on metoprolol succinate, however patient did not tolerate this medication.  Cardiology noted that could consider carvedilol if blood pressure remained elevated.  Patient has a supply of short acting nitrates (NTG) to use on a as needed basis for recurrent angina/anginal equivalent symptoms; denied recent use. Patient is on rosuvastatin + omega-3 fatty acid for her HLD diagnosis and ASCVD prevention.  Patient with a prediabetes diagnosis.  Patient's most recent hemoglobin A1c was 6.3% when checked on 12/07/2022.Functional capacity is somewhat limited by patient's age and multiple medical comorbidities.  With that said, patient is able to complete all of her ADL/IADLs without cardiovascular limitation.  Per the DASI, patient is able to achieve at least 4 METS of  physical activity without experiencing any significant degree of angina/anginal equivalent symptoms.  No changes were made to his medication regimen.  Patient to follow-up with  outpatient cardiology in 6 months or sooner if needed.  Jennifer Grant is scheduled for an elective TOTAL VAGINAL  HYSTERECTOMY WITH BSO (Bilateral); ANTERIOR AND POSTERIOR  REPAIR (PELVIC ORGAN PROLAPSE) on 04/04/2023 with Dr. Hildred Laser, MD.  Given patient's past medical history significant for cardiovascular diagnoses, presurgical cardiac clearance was sought by the PAT team. Per cardiology, "based ACC/AHA guidelines, the patient's past medical history, and the amount of time since her last clinic visit, this patient would be at an overall ACCEPTABLE risk for the planned procedure without further cardiovascular testing or intervention at this time".   In review of her medication reconciliation, it is noted that patient is currently on prescribed daily antithrombotic therapy. She has been instructed on recommendations for holding her clopidogrel for 5 days prior to her procedure with plans to restart as soon as postoperative bleeding risk felt to be minimized by her attending surgeon. The patient has been instructed that her last dose of her clopidogrel should be on 03/29/2023.  Given patient's extensive cardiovascular history, patient to continue her daily low-dose ASA throughout her perioperative course.  Patient reports previous perioperative complications with anesthesia in the past.  Patient experienced (+) oxygen desaturation with associated bradycardia during routine colonoscopy; see notes under medical history.  In review of the available records, it is noted that patient underwent a general anesthetic course here at [facility] (ASA III) with the aforementioned complications.  Patient was given atropine and cardiology was consulted.  Patient cleared to proceed without further intervention per cardiology; remainder of procedure uneventful.     03/25/2023    3:00 PM 03/08/2023   11:17 AM 01/27/2023    7:59 AM  Vitals with BMI  Height 5\' 3"  5\' 3"  5' 2.5"   Weight 229 lbs 1 oz 229 lbs 234 lbs  BMI 40.59 40.58 42.09  Systolic  151 140  Diastolic  89 100  Pulse  78 69    Providers/Specialists:   NOTE: Primary physician provider listed below. Patient may have been seen by APP or partner within same practice.   PROVIDER ROLE / SPECIALTY LAST Para Skeans, MD OB/GYN (Surgeon) 03/08/2023  Marjie Skiff, NP Primary Care Provider 01/21/2023  Lorine Bears, MD Cardiology 01/27/2023; update preop APP call on 03/29/2023   Allergies:  Brilinta [ticagrelor], Lipitor [atorvastatin], Latex, Penicillins, and Tekturna [aliskiren]  Current Home Medications:   No current facility-administered medications for this encounter.    amLODipine (NORVASC) 10 MG tablet   aspirin EC 81 MG tablet   cholecalciferol (VITAMIN D3) 25 MCG (1000 UNIT) tablet   clopidogrel (PLAVIX) 75 MG tablet   Cyanocobalamin (VITAMIN B12 PO)   diltiazem (CARDIZEM) 30 MG tablet   hydrochlorothiazide (HYDRODIURIL) 25 MG tablet   isosorbide mononitrate (IMDUR) 60 MG 24 hr tablet   lidocaine (LIDODERM) 5 %   losartan (COZAAR) 100 MG tablet   MAGNESIUM PO   nitroGLYCERIN (NITROSTAT) 0.4 MG SL tablet   Omega-3 Fatty Acids (FISH OIL PO)   potassium chloride SA (KLOR-CON M) 20 MEQ tablet   rosuvastatin (CRESTOR) 40 MG tablet   spironolactone (ALDACTONE) 25 MG tablet   UNABLE TO FIND   History:   Past Medical History:  Diagnosis Date   (HFpEF) heart failure with preserved ejection fraction (HCC)    a.) TTE 06/09/2015: EF 50-55%,  sev LVH, sev lat HK, mild LA dil, mild red RVSF, mild PR, G1DD; b.) TTE 07/04/2020: EF 50-55%, mild LVH, diffuse HK, apical lat AK, mild LA dil, triv MR, mod AoV calc, G1DD; c.) TTE 02/17/2022: EF 50-55%, LVH, LV/ mid apical antlat/apical HK, mild LA dil, triv MR, mod MAC, triv AR, AoV scler; e.) TTE 06/22/2022: EF >55%, LVH, mod MAC, triv AR, G1DD   Acute non-ST elevation myocardial infarction (NSTEMI) of inferior wall (HCC) 08/24/2003   a.)  LHC/PCI 08/26/2003:  95% dRCA (2.5 x 20 mm Taxus DES), 75% dRCA (2.5 x 20 mm Taxus DES), 95% RPDA (2.5 x 16 mm Taxus DES)   Acute ST elevation myocardial infarction (STEMI) of lateral wall (HCC) 06/08/2015   a.) LHC/PCI 06/08/2015: overlapping 2.75 x 20 mm and 3.0 x 8 mm Promus Premier DES to OM1. PTCA of OM2.   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 PTCA of OM2; e.) staged PCI 07/16/15: 3.0x28 mm Promus Prem mRCA   Chronic bilateral low back pain with right-sided sciatica    Chronic pain of both shoulders    Chronic pain of right knee    Complication of anesthesia    a.) states "we almost lost you" with colonoscopy 2022 --> desat with assoc bradycardia (? obstruction). Pt turned supine & placed on 100% FiO2 face mask. Propofol infusion stopped. Atropine 0.4 mg given. Crash cart brought in & pads placed (strong pulse throughout). HR & sats improved. Discussed with cards "okay to proceed" with MD in room & jaw thrust to relieve obstruction - remainder uneventful   COVID    Cystocele without uterine prolapse    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    "got booster shot a few times"   GERD (gastroesophageal reflux disease)    History of heart artery stent 08/26/2003   a.) TOTAL OF 9 cardiac stents as of 03/30/2023   Hyperlipidemia    Hypertension    Long term current use of antithrombotics/antiplatelets    a.) on DAPT (ASA + clopidogrel)   Morbid obesity (HCC)     Noncompliance with medication regimen    OSA (obstructive sleep apnea)    a.) no nocturnal PAP therapy; "never received machine" per patient   Paroxysmal atrial fibrillation (HCC)    a.) CHA2DS2VASc = 7 (age, sex, CHF, HTN, TIA x 2, vascular disease history);  b.) rate/rhythm maintained on oral diltiazem; no chronic anticoagulation   Personal history of colonic polyps    Pre-diabetes    PSVT (paroxysmal supraventricular tachycardia) 08/31/2022   a.)  Zio 02/17/2022 12 runs PSVT with longest lasting > 16 minutes (max rate 142 bpm); b.) Zio 08/31/2022: 10 runs PSVT with longest lasting 54 minutes (max rate 245 bpm)   PVC (premature ventricular contraction)    Second degree heart block 02/17/2022   a.) Zio 02/17/2022 - likely occurred  while patient was sleeping   TIA (transient ischemic attack) 06/22/2022   Vitamin D deficiency    Past Surgical History:  Procedure Laterality Date   CARDIAC CATHETERIZATION N/A 06/08/2015   Procedure: Left Heart Cath and Coronary Angiography;  Surgeon: Lyn Records, MD;  Location: [facility] INVASIVE CV LAB;  Service: Cardiovascular;  Laterality: N/A;   CARDIAC CATHETERIZATION N/A 06/08/2015   Procedure: Coronary Stent Intervention;  Surgeon: Lyn Records, MD;  Location: [facility] INVASIVE CV LAB;  Service: Cardiovascular;  Laterality: N/A;   CARDIAC CATHETERIZATION N/A 07/16/2015   Procedure: Coronary Stent Intervention;  Surgeon: Lyn Records, MD;  Location: [facility] INVASIVE CV LAB;  Service: Cardiovascular;  Laterality: N/A;   COLONOSCOPY N/A 02/17/2021   Procedure: COLONOSCOPY;  Surgeon: Midge Minium, MD;  Location: [facility] ENDOSCOPY;  Service: Endoscopy;  Laterality: N/A;   CORONARY ANGIOPLASTY WITH STENT PLACEMENT Left 08/26/2003   Procedure: CORONARY ANGIOPLASTY WITH STENT PLACEMENT; Location: [facility]; Surgeon: Cynda Acres, MD   CORONARY ANGIOPLASTY WITH STENT PLACEMENT Left 09/23/2003   Procedure: CORONARY ANGIOPLASTY WITH STENT PLACEMENT; Location: [facility]; Surgeon: Gasper Sells, MD   KNEE ARTHROSCOPY Left 04/30/1989   LEFT HEART CATH AND CORONARY ANGIOGRAPHY Left 08/26/2003   Procedure: LEFT HEART CATH AND CORONARY ANGIOGRAPHY; Location: [facility]; Surgeon: Harold Hedge, MD   LEFT HEART CATH AND CORONARY ANGIOGRAPHY Left 08/26/2005   ProcedureL LEFT HEART CATH AND CORONARY ANGIOGRAPHY; Location: [facility]; Surgeon: Adrian Blackwater, MD   LEFT HEART CATH AND CORONARY ANGIOGRAPHY Left 02/17/2009   Procedure: LEFT HEART CATH AND CORONARY ANGIOGRAPHY; Location: [facility]; Surgeon: Lorine Bears, MD   LEFT HEART CATH AND CORONARY ANGIOGRAPHY Left 09/23/2003   Procedure: LEFT HEART CATH AND CORONARY ANGIOGRAPHY; Location: [facility]; Surgeon: Adrian Blackwater, MD   LEFT HEART CATH AND CORONARY ANGIOGRAPHY Left 11/01/2008   Procedure: LEFT HEART CATH AND CORONARY ANGIOGRAPHY; Location: [facility]; Surgeon: Adrian Blackwater, MD   TUBAL LIGATION  ~ 1978   Family History  Problem Relation Age of Onset   Healthy Mother    Prostate cancer Father    Kidney disease Father    Healthy Brother    Cancer Neg Hx    Diabetes Neg Hx    Heart disease Neg Hx    Social History   Tobacco Use   Smoking status: Never   Smokeless tobacco: Never  Vaping Use   Vaping status: Never Used  Substance Use Topics   Alcohol use: No   Drug use: No    Pertinent Clinical Results:  LABS:   Hospital Outpatient Visit on 03/28/2023  Component Date Value Ref Range Status   WBC 03/28/2023 5.9  4.0 - 10.5 K/uL Final   RBC 03/28/2023 5.35 (H)  3.87 - 5.11 MIL/uL Final   Hemoglobin 03/28/2023 13.8  12.0 - 15.0 g/dL Final   HCT 82/95/6213 43.6  36.0 - 46.0 % Final   MCV 03/28/2023 81.5  80.0 - 100.0 fL Final   MCH 03/28/2023 25.8 (L)  26.0 - 34.0 pg Final   MCHC 03/28/2023 31.7  30.0 - 36.0 g/dL Final   RDW 08/65/7846 15.3  11.5 - 15.5 % Final   Platelets 03/28/2023 222  150 - 400 K/uL Final   nRBC 03/28/2023 0.0  0.0 - 0.2 % Final   Performed at [facility], [address] [place], [place] [address]    ABO/RH(D) 03/28/2023 AB POS   Final   Antibody Screen 03/28/2023 NEG   Final   Sample Expiration 03/28/2023 04/11/2023,2359   Final   Extend sample  reason 03/28/2023    Final                   Value:NO TRANSFUSIONS OR PREGNANCY IN THE PAST 3 MONTHS Performed at [facility], [address] [place], [place] [address]    Sodium 03/28/2023 135  135 - 145 mmol/L Final   Potassium 03/28/2023 3.2 (L)  3.5 - 5.1 mmol/L Final   Chloride 03/28/2023 98  98 - 111 mmol/L Final   CO2 03/28/2023 27  22 - 32 mmol/L Final   Glucose, Bld 03/28/2023 96  70 - 99 mg/dL Final   Glucose reference range applies only to samples taken after fasting for at least 8 hours.   BUN 03/28/2023 12  8 - 23 mg/dL Final   Creatinine, Ser 03/28/2023 0.73  0.44 - 1.00 mg/dL Final   Calcium 46/96/2952 9.9  8.9 - 10.3 mg/dL Final   Total Protein 84/13/2440 8.5 (H)  6.5 - 8.1 g/dL Final   Albumin 06/26/2535 4.2  3.5 - 5.0 g/dL Final   AST 64/40/3474 24  15 - 41 U/L Final   ALT 03/28/2023 20  0 - 44 U/L Final   Alkaline Phosphatase 03/28/2023 52  38 - 126 U/L Final   Total Bilirubin 03/28/2023 1.0  0.3 - 1.2 mg/dL Final   GFR, Estimated 03/28/2023 >60  >60 mL/min Final   Comment: (NOTE) Calculated using the CKD-EPI Creatinine Equation (2021)    Anion gap 03/28/2023 10  5 - 15 Final   Performed at [facility], [address] [place], [place] [address]    ECG: Date: 01/27/2023 Time ECG obtained: 0803 AM Rate: 69 bpm Rhythm:  Sinus rhythm with occasional PVCs Axis (leads I and aVF): Normal Intervals: PR 176 ms. QRS 106 ms. QTc 373 ms. ST segment and T wave changes: Nonspecific T wave abnormalities  Comparison: Similar to previous tracing obtained on 08/06/2022   IMAGING / PROCEDURES: LONG TERM CARDIAC EVENT MONITOR STUDY performed on 08/31/2022 Patch Wear Time:  10 days and 10 hours (2023-12-11T22:42:01-499 to 2023-12-22T08:52:34-0500) Patient had a min HR of 39 bpm, max HR of 245 bpm, and avg  HR of 61 bpm.  Predominant underlying rhythm was Sinus Rhythm.  10 Supraventricular Tachycardia runs occurred, the run with the fastest interval lasting 54 mins 9 secs with a max rate of 245 bpm (avg 146 bpm); the run with the fastest interval was also the longest. Isolated SVEs were rare (<1.0%), SVE Couplets were rare (<1.0%), and SVE Triplets were rare (<1.0%).  Isolated VEs were occasional (2.7%, 24523), VE Couplets were rare (<1.0%, 534), and no VE Triplets were present. Ventricular Bigeminy and Trigeminy were present.   TRANSTHORACIC ECHOCARDIOGRAM performed on 06/22/2022 Left ventricular ejection fraction, by estimation, is >55%. The left ventricle has normal function. Left ventricular endocardial border not  optimally defined to evaluate regional wall motion. There is mild left  ventricular hypertrophy. Left  ventricular diastolic parameters are consistent with Grade I diastolic  dysfunction (impaired relaxation).  Right ventricular systolic function is normal. The right ventricular size is normal.  The mitral valve was not well visualized. No evidence of mitral valve regurgitation. No evidence of mitral stenosis. Moderate mitral annular calcification.  The aortic valve was not well visualized. Aortic valve regurgitation is trivial.  There is mild dilatation of the ascending aorta, measuring 42 mm.  The inferior vena cava is normal in size with greater than 50% respiratory variability, suggesting right atrial pressure of 3 mmHg.   MYOCARDIAL  PERFUSION IMAGING STUDY (LEXISCAN) performed on 02/18/2022 Mildly reduced left ventricular systolic function with an EF of 45-54% End systolic and diastolic cavity size normal Medium perfusion defect with moderate reduction in uptake present in the apical to mid anterolateral, lateral, and apex locations that is fixed.  Viability is absent.  There is abnormal wall motion in the defect area.  Findings consistent with previous infarction. No  stress-induced myocardial arrhythmia Intermediate risk study.  MR BRAIN WO CONTRAST performed on 06/22/2022 No acute intracranial process.  No evidence of acute or subacute infarct.  Impression and Plan:  Jennifer Grant has been referred for pre-anesthesia review and clearance prior to her undergoing the planned anesthetic and procedural courses. Available labs, pertinent testing, and imaging results were personally reviewed by me in preparation for upcoming operative/procedural course. [facility] medical record has been updated following extensive record review and patient interview with PAT staff.   This patient has been appropriately cleared by cardiology with an overall ACCEPTABLE risk of experiencing significant perioperative cardiovascular complications. Based on clinical review performed today (03/30/23), barring any significant acute changes in the patient's overall condition, it is anticipated that she will be able to proceed with the planned surgical intervention. Any acute changes in clinical condition may necessitate her procedure being postponed and/or cancelled. Patient will meet with anesthesia team (MD and/or CRNA) on the day of her procedure for preoperative evaluation/assessment. Questions regarding anesthetic course will be fielded at that time.   Pre-surgical instructions were reviewed with the patient during her PAT appointment, and questions were fielded to satisfaction by PAT clinical staff. She has been instructed on which medications that she will need to hold prior to surgery, as well as the ones that have been deemed safe/appropriate to take on the day of her procedure. As part of the general education provided by PAT, patient made aware both verbally and in writing, that she would need to abstain from the use of any illegal substances during her perioperative course.  She was advised that failure to follow the provided instructions could necessitate case cancellation or  result in serious perioperative complications up to and including death. Patient encouraged to contact PAT and/or her surgeon's office to discuss any questions or concerns that may arise prior to surgery; verbalized understanding.   Quentin Mulling, MSN, APRN, FNP-C, CEN [facility]  Peri-operative Services Nurse Practitioner Phone: [phone] Fax: [phone] 03/30/23 1:24 PM  NOTE: This note has been prepared using Dragon dictation software. Despite my best ability to proofread, there is always the potential that unintentional transcriptional errors may still occur from this process.

## 2023-03-29 NOTE — Telephone Encounter (Signed)
Good Morning Dr. Kirke Corin  We have received a surgical clearance request for [name].  She has a PMH of CAD s/p MI 2004 with stent to left circumflex, OM1, OM 2, RCA, RPDA, lateral STEMI 05/2015 with ISR to OM1, OM 2,, distal RCA and PDA ISR treated with Popa to OM 2 and DES to OM1.  She had staged PCI 07/2015 with DES to mid distal RCA.  She had most recent Myoview completed 01/2022 showing no ischemia and intermediate risk.  She also has a history of HFpEF and SVT.  They were seen recently in clinic on 01/27/2023. Can you please comment on surgical clearance and guidance on holding Plavix and aspirin for total vaginal hysterectomy. Please forward you guidance and recommendations to [facility] PREOP   Thank you, Robin Searing, NP

## 2023-03-29 NOTE — Telephone Encounter (Signed)
   Patient Name: Jennifer Grant  DOB: 12/30/1950 MRN: [id number]  Primary Cardiologist: Lorine Bears, MD  Chart reviewed as part of pre-operative protocol coverage. Given past medical history and time since last visit, based on ACC/AHA guidelines, KALEB DIMEGLIO is at acceptable risk for the planned procedure without further cardiovascular testing per Dr. Kirke Corin.  Patient can hold Plavix 5 days prior to procedure and should continue ASA 81 mg throughout perioperative period due to multiple cardiac events and stents.   I will route this recommendation to the requesting party via Epic fax function and remove from pre-op pool.  Please call with questions.  Napoleon Form, Leodis Rains, NP 03/29/2023, 9:51 AM

## 2023-03-29 NOTE — Telephone Encounter (Signed)
Pt calling to see what time she needs to be at the hospital Monday am for surgery and what meds to stop taking before surgery?  [phone]

## 2023-03-29 NOTE — Telephone Encounter (Signed)
She is at low risk from a cardiac standpoint. Plavix can be held 5 days before but aspirin 81 mg once daily should be continued given multiple cardiac events and stents.

## 2023-03-29 NOTE — Telephone Encounter (Signed)
Pt aware of what number to call and adv to stop plavix and no aspirin products for a week before surgery and may take all other meds through Sunday pm.  Pt states that per her cardiologist she is to stay on the aspirin.  Pt states [facility] already knows.

## 2023-03-30 ENCOUNTER — Encounter: Payer: Self-pay | Admitting: Obstetrics and Gynecology

## 2023-03-30 NOTE — Telephone Encounter (Signed)
Jennifer Grant is aware. Patient has been notified to stop Plavix and continue aspirin.

## 2023-04-03 MED ORDER — FAMOTIDINE 20 MG PO TABS
20.0000 mg | ORAL_TABLET | Freq: Once | ORAL | Status: AC
  Administered 2023-04-04: 20 mg via ORAL

## 2023-04-03 MED ORDER — HEPARIN SODIUM (PORCINE) 5000 UNIT/ML IJ SOLN
5000.0000 [IU] | INTRAMUSCULAR | Status: AC
  Administered 2023-04-04: 5000 [IU] via SUBCUTANEOUS

## 2023-04-03 MED ORDER — CELECOXIB 200 MG PO CAPS
400.0000 mg | ORAL_CAPSULE | ORAL | Status: AC
  Administered 2023-04-04: 400 mg via ORAL

## 2023-04-03 MED ORDER — POVIDONE-IODINE 10 % EX SWAB
2.0000 | Freq: Once | CUTANEOUS | Status: AC
  Administered 2023-04-04: 2 via TOPICAL

## 2023-04-03 MED ORDER — GABAPENTIN 300 MG PO CAPS
300.0000 mg | ORAL_CAPSULE | ORAL | Status: AC
  Administered 2023-04-04: 300 mg via ORAL

## 2023-04-03 MED ORDER — ACETAMINOPHEN 500 MG PO TABS
1000.0000 mg | ORAL_TABLET | ORAL | Status: AC
  Administered 2023-04-04: 1000 mg via ORAL

## 2023-04-03 MED ORDER — CEFAZOLIN SODIUM-DEXTROSE 2-4 GM/100ML-% IV SOLN
2.0000 g | INTRAVENOUS | Status: AC
  Administered 2023-04-04: 2 g via INTRAVENOUS

## 2023-04-03 MED ORDER — CHLORHEXIDINE GLUCONATE 0.12 % MT SOLN
15.0000 mL | Freq: Once | OROMUCOSAL | Status: AC
  Administered 2023-04-04: 15 mL via OROMUCOSAL

## 2023-04-03 MED ORDER — LACTATED RINGERS IV SOLN: INTRAVENOUS | Status: DC

## 2023-04-03 MED ORDER — ORAL CARE MOUTH RINSE: 15.0000 mL | Freq: Once | OROMUCOSAL | Status: AC

## 2023-04-04 ENCOUNTER — Encounter
Admission: RE | Disposition: A | Discharge status: Home / Self Care | Payer: Self-pay | Source: Ambulatory Visit | Attending: Obstetrics and Gynecology

## 2023-04-04 ENCOUNTER — Other Ambulatory Visit: Payer: Self-pay

## 2023-04-04 ENCOUNTER — Encounter: Payer: Self-pay | Admitting: Obstetrics and Gynecology

## 2023-04-04 ENCOUNTER — Ambulatory Visit: Payer: 59 | Admitting: Urgent Care

## 2023-04-04 ENCOUNTER — Observation Stay
Admission: RE | Admit: 2023-04-04 | Discharge: 2023-04-05 | Disposition: A | Discharge status: Home / Self Care | Payer: 59 | Source: Ambulatory Visit | Attending: Obstetrics and Gynecology | Admitting: Obstetrics and Gynecology

## 2023-04-04 DIAGNOSIS — I480 Paroxysmal atrial fibrillation: Secondary | ICD-10-CM | POA: Insufficient documentation

## 2023-04-04 DIAGNOSIS — Z955 Presence of coronary angioplasty implant and graft: Secondary | ICD-10-CM | POA: Insufficient documentation

## 2023-04-04 DIAGNOSIS — N814 Uterovaginal prolapse, unspecified: Secondary | ICD-10-CM

## 2023-04-04 DIAGNOSIS — N816 Rectocele: Secondary | ICD-10-CM | POA: Insufficient documentation

## 2023-04-04 DIAGNOSIS — I25118 Atherosclerotic heart disease of native coronary artery with other forms of angina pectoris: Secondary | ICD-10-CM | POA: Diagnosis not present

## 2023-04-04 DIAGNOSIS — N813 Complete uterovaginal prolapse: Secondary | ICD-10-CM | POA: Diagnosis not present

## 2023-04-04 DIAGNOSIS — Z01812 Encounter for preprocedural laboratory examination: Principal | ICD-10-CM

## 2023-04-04 DIAGNOSIS — N8110 Cystocele, unspecified: Secondary | ICD-10-CM | POA: Insufficient documentation

## 2023-04-04 DIAGNOSIS — E876 Hypokalemia: Secondary | ICD-10-CM

## 2023-04-04 DIAGNOSIS — N819 Female genital prolapse, unspecified: Principal | ICD-10-CM | POA: Insufficient documentation

## 2023-04-04 DIAGNOSIS — I110 Hypertensive heart disease with heart failure: Secondary | ICD-10-CM | POA: Insufficient documentation

## 2023-04-04 DIAGNOSIS — Z91040 Latex allergy status: Secondary | ICD-10-CM | POA: Diagnosis not present

## 2023-04-04 DIAGNOSIS — I10 Essential (primary) hypertension: Secondary | ICD-10-CM | POA: Diagnosis not present

## 2023-04-04 DIAGNOSIS — Z7902 Long term (current) use of antithrombotics/antiplatelets: Secondary | ICD-10-CM | POA: Insufficient documentation

## 2023-04-04 DIAGNOSIS — Z7982 Long term (current) use of aspirin: Secondary | ICD-10-CM | POA: Diagnosis not present

## 2023-04-04 DIAGNOSIS — I5030 Unspecified diastolic (congestive) heart failure: Secondary | ICD-10-CM | POA: Diagnosis not present

## 2023-04-04 DIAGNOSIS — Z8673 Personal history of transient ischemic attack (TIA), and cerebral infarction without residual deficits: Secondary | ICD-10-CM | POA: Insufficient documentation

## 2023-04-04 DIAGNOSIS — I2510 Atherosclerotic heart disease of native coronary artery without angina pectoris: Secondary | ICD-10-CM | POA: Diagnosis not present

## 2023-04-04 DIAGNOSIS — N189 Chronic kidney disease, unspecified: Secondary | ICD-10-CM | POA: Diagnosis not present

## 2023-04-04 DIAGNOSIS — Z90710 Acquired absence of both cervix and uterus: Secondary | ICD-10-CM | POA: Diagnosis present

## 2023-04-04 DIAGNOSIS — Z79899 Other long term (current) drug therapy: Secondary | ICD-10-CM | POA: Insufficient documentation

## 2023-04-04 DIAGNOSIS — Z8616 Personal history of COVID-19: Secondary | ICD-10-CM | POA: Diagnosis not present

## 2023-04-04 DIAGNOSIS — T502X5A Adverse effect of carbonic-anhydrase inhibitors, benzothiadiazides and other diuretics, initial encounter: Secondary | ICD-10-CM

## 2023-04-04 HISTORY — DX: Obstructive sleep apnea (adult) (pediatric): G47.33

## 2023-04-04 HISTORY — PX: ANTERIOR AND POSTERIOR REPAIR: SHX5121

## 2023-04-04 HISTORY — PX: VAGINAL HYSTERECTOMY: SHX2639

## 2023-04-04 HISTORY — DX: Long term (current) use of antithrombotics/antiplatelets: Z79.02

## 2023-04-04 LAB — POCT I-STAT, CHEM 8
BUN: 9 mg/dL (ref 8–23)
Calcium, Ion: 1.28 mmol/L (ref 1.15–1.40)
Chloride: 99 mmol/L (ref 98–111)
Creatinine, Ser: 0.9 mg/dL (ref 0.44–1.00)
Glucose, Bld: 93 mg/dL (ref 70–99)
HCT: 45 % (ref 36.0–46.0)
Hemoglobin: 15.3 g/dL — ABNORMAL HIGH (ref 12.0–15.0)
Potassium: 3.2 mmol/L — ABNORMAL LOW (ref 3.5–5.1)
Sodium: 136 mmol/L (ref 135–145)
TCO2: 28 mmol/L (ref 22–32)

## 2023-04-04 SURGERY — HYSTERECTOMY, VAGINAL
Anesthesia: General

## 2023-04-04 MED ORDER — AMLODIPINE BESYLATE 10 MG PO TABS
10.0000 mg | ORAL_TABLET | Freq: Every day | ORAL | Status: DC
  Filled 2023-04-04: qty 1

## 2023-04-04 MED ORDER — 0.9 % SODIUM CHLORIDE (POUR BTL) OPTIME
TOPICAL | Status: DC | PRN
  Administered 2023-04-04: 500 mL

## 2023-04-04 MED ORDER — SODIUM CHLORIDE (PF) 0.9 % IJ SOLN
INTRAMUSCULAR | Status: DC | PRN
  Administered 2023-04-04 (×2): 10 mL

## 2023-04-04 MED ORDER — OXYCODONE HCL 5 MG/5ML PO SOLN: 5.0000 mg | Freq: Once | ORAL | Status: DC | PRN

## 2023-04-04 MED ORDER — FENTANYL CITRATE (PF) 100 MCG/2ML IJ SOLN
INTRAMUSCULAR | Status: DC | PRN
  Administered 2023-04-04: 50 ug via INTRAVENOUS

## 2023-04-04 MED ORDER — HYDROCODONE-ACETAMINOPHEN 5-325 MG PO TABS
1.0000 | ORAL_TABLET | ORAL | Status: DC | PRN
  Administered 2023-04-04 – 2023-04-05 (×2): 1 via ORAL
  Filled 2023-04-04 (×3): qty 1

## 2023-04-04 MED ORDER — ONDANSETRON HCL 4 MG/2ML IJ SOLN: 4.0000 mg | Freq: Four times a day (QID) | INTRAMUSCULAR | Status: DC | PRN

## 2023-04-04 MED ORDER — LOSARTAN POTASSIUM 50 MG PO TABS
100.0000 mg | ORAL_TABLET | Freq: Every day | ORAL | Status: DC
  Filled 2023-04-04: qty 2

## 2023-04-04 MED ORDER — ACETAMINOPHEN 10 MG/ML IV SOLN: 1000.0000 mg | Freq: Once | INTRAVENOUS | Status: DC | PRN

## 2023-04-04 MED ORDER — MIDAZOLAM HCL 2 MG/2ML IJ SOLN
INTRAMUSCULAR | Status: AC
  Filled 2023-04-04: qty 2

## 2023-04-04 MED ORDER — SODIUM CHLORIDE (PF) 0.9 % IJ SOLN
INTRAMUSCULAR | Status: AC
  Filled 2023-04-04: qty 50

## 2023-04-04 MED ORDER — PROPOFOL 10 MG/ML IV BOLUS
INTRAVENOUS | Status: DC | PRN
Start: 2023-04-04 — End: 2023-04-04
  Administered 2023-04-04: 20 ug/kg/min via INTRAVENOUS
  Administered 2023-04-04: 50 mg via INTRAVENOUS
  Administered 2023-04-04: 100 mg via INTRAVENOUS
  Administered 2023-04-04: 30 mg via INTRAVENOUS

## 2023-04-04 MED ORDER — GABAPENTIN 100 MG PO CAPS
100.0000 mg | ORAL_CAPSULE | Freq: Three times a day (TID) | ORAL | Status: DC
  Administered 2023-04-04 (×2): 100 mg via ORAL
  Filled 2023-04-04 (×2): qty 1

## 2023-04-04 MED ORDER — GLYCOPYRROLATE 0.2 MG/ML IJ SOLN
INTRAMUSCULAR | Status: AC
  Filled 2023-04-04: qty 1

## 2023-04-04 MED ORDER — ONDANSETRON HCL 4 MG/2ML IJ SOLN
INTRAMUSCULAR | Status: AC
  Filled 2023-04-04: qty 2

## 2023-04-04 MED ORDER — OXYCODONE HCL 5 MG PO TABS: 5.0000 mg | ORAL_TABLET | Freq: Once | ORAL | Status: DC | PRN

## 2023-04-04 MED ORDER — FENTANYL CITRATE (PF) 100 MCG/2ML IJ SOLN: 25.0000 ug | INTRAMUSCULAR | Status: DC | PRN

## 2023-04-04 MED ORDER — ESTROGENS CONJUGATED 0.625 MG/GM VA CREA
TOPICAL_CREAM | VAGINAL | Status: AC
  Filled 2023-04-04: qty 30

## 2023-04-04 MED ORDER — SUGAMMADEX SODIUM 200 MG/2ML IV SOLN
INTRAVENOUS | Status: DC | PRN
  Administered 2023-04-04: 300 mg via INTRAVENOUS

## 2023-04-04 MED ORDER — IBUPROFEN 600 MG PO TABS: 600.0000 mg | ORAL_TABLET | Freq: Four times a day (QID) | ORAL | Status: DC

## 2023-04-04 MED ORDER — BUPIVACAINE HCL 0.5 % IJ SOLN
INTRAMUSCULAR | Status: DC | PRN
  Administered 2023-04-04: 10 mL

## 2023-04-04 MED ORDER — GLYCOPYRROLATE 0.2 MG/ML IJ SOLN
INTRAMUSCULAR | Status: DC | PRN
  Administered 2023-04-04: .2 mg via INTRAVENOUS

## 2023-04-04 MED ORDER — HEPARIN SODIUM (PORCINE) 5000 UNIT/ML IJ SOLN
INTRAMUSCULAR | Status: AC
  Filled 2023-04-04: qty 1

## 2023-04-04 MED ORDER — FENTANYL CITRATE (PF) 100 MCG/2ML IJ SOLN
INTRAMUSCULAR | Status: AC
  Filled 2023-04-04: qty 2

## 2023-04-04 MED ORDER — ROCURONIUM BROMIDE 100 MG/10ML IV SOLN
INTRAVENOUS | Status: DC | PRN
  Administered 2023-04-04 (×2): 10 mg via INTRAVENOUS
  Administered 2023-04-04: 40 mg via INTRAVENOUS
  Administered 2023-04-04: 10 mg via INTRAVENOUS

## 2023-04-04 MED ORDER — FENTANYL CITRATE (PF) 100 MCG/2ML IJ SOLN
25.0000 ug | INTRAMUSCULAR | Status: DC | PRN
  Administered 2023-04-04: 25 ug via INTRAVENOUS

## 2023-04-04 MED ORDER — PHENYLEPHRINE HCL-NACL 20-0.9 MG/250ML-% IV SOLN
INTRAVENOUS | Status: DC | PRN
  Administered 2023-04-04: 10 ug/min via INTRAVENOUS

## 2023-04-04 MED ORDER — TRAMADOL HCL 50 MG PO TABS: 50.0000 mg | ORAL_TABLET | Freq: Four times a day (QID) | ORAL | Status: DC | PRN

## 2023-04-04 MED ORDER — SIMETHICONE 80 MG PO CHEW
80.0000 mg | CHEWABLE_TABLET | Freq: Four times a day (QID) | ORAL | Status: DC | PRN
  Administered 2023-04-04: 80 mg via ORAL
  Filled 2023-04-04: qty 1

## 2023-04-04 MED ORDER — KETAMINE HCL 50 MG/5ML IJ SOSY
PREFILLED_SYRINGE | INTRAMUSCULAR | Status: AC
  Filled 2023-04-04: qty 5

## 2023-04-04 MED ORDER — ROCURONIUM BROMIDE 10 MG/ML (PF) SYRINGE
PREFILLED_SYRINGE | INTRAVENOUS | Status: AC
  Filled 2023-04-04: qty 10

## 2023-04-04 MED ORDER — LIDOCAINE HCL (PF) 2 % IJ SOLN
INTRAMUSCULAR | Status: AC
  Filled 2023-04-04: qty 10

## 2023-04-04 MED ORDER — FAMOTIDINE 20 MG PO TABS
ORAL_TABLET | ORAL | Status: AC
  Filled 2023-04-04: qty 1

## 2023-04-04 MED ORDER — KETOROLAC TROMETHAMINE 30 MG/ML IJ SOLN
30.0000 mg | Freq: Four times a day (QID) | INTRAMUSCULAR | Status: AC
  Administered 2023-04-04 – 2023-04-05 (×4): 30 mg via INTRAVENOUS
  Filled 2023-04-04 (×4): qty 1

## 2023-04-04 MED ORDER — DEXAMETHASONE SODIUM PHOSPHATE 10 MG/ML IJ SOLN
INTRAMUSCULAR | Status: DC | PRN
  Administered 2023-04-04: 5 mg via INTRAVENOUS

## 2023-04-04 MED ORDER — ISOSORBIDE MONONITRATE ER 60 MG PO TB24
60.0000 mg | ORAL_TABLET | Freq: Every day | ORAL | Status: DC
  Filled 2023-04-04: qty 1

## 2023-04-04 MED ORDER — DEXAMETHASONE SODIUM PHOSPHATE 10 MG/ML IJ SOLN
INTRAMUSCULAR | Status: AC
  Filled 2023-04-04: qty 1

## 2023-04-04 MED ORDER — PANTOPRAZOLE SODIUM 40 MG PO TBEC
40.0000 mg | DELAYED_RELEASE_TABLET | Freq: Every day | ORAL | Status: DC
  Administered 2023-04-04: 40 mg via ORAL
  Filled 2023-04-04: qty 1

## 2023-04-04 MED ORDER — ALUM & MAG HYDROXIDE-SIMETH 200-200-20 MG/5ML PO SUSP: 30.0000 mL | ORAL | Status: DC | PRN

## 2023-04-04 MED ORDER — CHLORHEXIDINE GLUCONATE 0.12 % MT SOLN
OROMUCOSAL | Status: AC
  Filled 2023-04-04: qty 15

## 2023-04-04 MED ORDER — LACTATED RINGERS IV SOLN: INTRAVENOUS | Status: DC

## 2023-04-04 MED ORDER — VASOPRESSIN 20 UNIT/ML IV SOLN
INTRAVENOUS | Status: AC
  Filled 2023-04-04: qty 1

## 2023-04-04 MED ORDER — CEFAZOLIN SODIUM-DEXTROSE 2-4 GM/100ML-% IV SOLN
INTRAVENOUS | Status: AC
  Filled 2023-04-04: qty 100

## 2023-04-04 MED ORDER — ONDANSETRON HCL 4 MG PO TABS: 4.0000 mg | ORAL_TABLET | Freq: Four times a day (QID) | ORAL | Status: DC | PRN

## 2023-04-04 MED ORDER — BUPIVACAINE HCL (PF) 0.5 % IJ SOLN
INTRAMUSCULAR | Status: AC
  Filled 2023-04-04: qty 30

## 2023-04-04 MED ORDER — SUCCINYLCHOLINE CHLORIDE 200 MG/10ML IV SOSY
PREFILLED_SYRINGE | INTRAVENOUS | Status: DC | PRN
  Administered 2023-04-04: 140 mg via INTRAVENOUS

## 2023-04-04 MED ORDER — CELECOXIB 200 MG PO CAPS
ORAL_CAPSULE | ORAL | Status: AC
  Filled 2023-04-04: qty 1

## 2023-04-04 MED ORDER — EPHEDRINE 5 MG/ML INJ
INTRAVENOUS | Status: AC
  Filled 2023-04-04: qty 5

## 2023-04-04 MED ORDER — ONDANSETRON HCL 4 MG/2ML IJ SOLN
INTRAMUSCULAR | Status: DC | PRN
Start: 2023-04-04 — End: 2023-04-04
  Administered 2023-04-04: 4 mg via INTRAVENOUS

## 2023-04-04 MED ORDER — MAGNESIUM CITRATE PO SOLN: 1.0000 | Freq: Once | ORAL | Status: DC | PRN

## 2023-04-04 MED ORDER — SUCCINYLCHOLINE CHLORIDE 200 MG/10ML IV SOSY
PREFILLED_SYRINGE | INTRAVENOUS | Status: AC
  Filled 2023-04-04: qty 10

## 2023-04-04 MED ORDER — LIDOCAINE HCL (CARDIAC) PF 100 MG/5ML IV SOSY
PREFILLED_SYRINGE | INTRAVENOUS | Status: DC | PRN
  Administered 2023-04-04 (×2): 80 mg via INTRAVENOUS

## 2023-04-04 MED ORDER — ROSUVASTATIN CALCIUM 20 MG PO TABS
40.0000 mg | ORAL_TABLET | Freq: Every day | ORAL | Status: DC
  Administered 2023-04-04: 40 mg via ORAL
  Filled 2023-04-04 (×2): qty 2

## 2023-04-04 MED ORDER — PHENYLEPHRINE 80 MCG/ML (10ML) SYRINGE FOR IV PUSH (FOR BLOOD PRESSURE SUPPORT)
PREFILLED_SYRINGE | INTRAVENOUS | Status: AC
  Filled 2023-04-04: qty 10

## 2023-04-04 MED ORDER — GABAPENTIN 300 MG PO CAPS
ORAL_CAPSULE | ORAL | Status: AC
  Filled 2023-04-04: qty 1

## 2023-04-04 MED ORDER — SENNOSIDES-DOCUSATE SODIUM 8.6-50 MG PO TABS
1.0000 | ORAL_TABLET | Freq: Every evening | ORAL | Status: DC | PRN
  Administered 2023-04-04: 1 via ORAL
  Filled 2023-04-04: qty 1

## 2023-04-04 MED ORDER — PROMETHAZINE HCL 25 MG/ML IJ SOLN: 6.2500 mg | INTRAMUSCULAR | Status: DC | PRN

## 2023-04-04 MED ORDER — DROPERIDOL 2.5 MG/ML IJ SOLN: 0.6250 mg | Freq: Once | INTRAMUSCULAR | Status: DC | PRN

## 2023-04-04 MED ORDER — EPHEDRINE SULFATE (PRESSORS) 50 MG/ML IJ SOLN
INTRAMUSCULAR | Status: DC | PRN
  Administered 2023-04-04 (×2): 5 mg via INTRAVENOUS

## 2023-04-04 MED ORDER — MIDAZOLAM HCL 2 MG/2ML IJ SOLN
INTRAMUSCULAR | Status: DC | PRN
  Administered 2023-04-04: 1 mg via INTRAVENOUS

## 2023-04-04 MED ORDER — LACTATED RINGERS IV SOLN: INTRAVENOUS | Status: DC | PRN

## 2023-04-04 MED ORDER — HYDROCHLOROTHIAZIDE 25 MG PO TABS
25.0000 mg | ORAL_TABLET | Freq: Every day | ORAL | Status: DC
  Administered 2023-04-04: 25 mg via ORAL
  Filled 2023-04-04: qty 1

## 2023-04-04 MED ORDER — DOCUSATE SODIUM 100 MG PO CAPS
100.0000 mg | ORAL_CAPSULE | Freq: Two times a day (BID) | ORAL | Status: DC
  Administered 2023-04-04 – 2023-04-05 (×3): 100 mg via ORAL
  Filled 2023-04-04 (×3): qty 1

## 2023-04-04 MED ORDER — BISACODYL 10 MG RE SUPP: 10.0000 mg | Freq: Every day | RECTAL | Status: DC | PRN

## 2023-04-04 MED ORDER — MENTHOL 3 MG MT LOZG
1.0000 | LOZENGE | OROMUCOSAL | Status: DC | PRN
  Administered 2023-04-04: 3 mg via ORAL
  Filled 2023-04-04: qty 9

## 2023-04-04 MED ORDER — PROPOFOL 10 MG/ML IV BOLUS
INTRAVENOUS | Status: AC
  Filled 2023-04-04: qty 40

## 2023-04-04 MED ORDER — PHENYLEPHRINE 80 MCG/ML (10ML) SYRINGE FOR IV PUSH (FOR BLOOD PRESSURE SUPPORT)
PREFILLED_SYRINGE | INTRAVENOUS | Status: DC | PRN
  Administered 2023-04-04: 80 ug via INTRAVENOUS

## 2023-04-04 MED ORDER — PHENYLEPHRINE HCL-NACL 20-0.9 MG/250ML-% IV SOLN
INTRAVENOUS | Status: AC
  Filled 2023-04-04: qty 250

## 2023-04-04 MED ORDER — ACETAMINOPHEN 500 MG PO TABS
ORAL_TABLET | ORAL | Status: AC
  Filled 2023-04-04: qty 2

## 2023-04-04 MED ORDER — KETAMINE HCL 10 MG/ML IJ SOLN
INTRAMUSCULAR | Status: DC | PRN
  Administered 2023-04-04: 10 mg via INTRAVENOUS
  Administered 2023-04-04: 30 mg via INTRAVENOUS

## 2023-04-04 SURGICAL SUPPLY — 50 items
BAG COUNTER SPONGE SURGICOUNT (BAG) ×1 IMPLANT
BAG DRN RND TRDRP ANRFLXCHMBR (UROLOGICAL SUPPLIES) ×1
BAG SPNG CNTER NS LX DISP (BAG) ×1
BAG URINE DRAIN 2000ML AR STRL (UROLOGICAL SUPPLIES) ×1 IMPLANT
BINDER ABDOMINAL 12 ML 46-62 (SOFTGOODS) IMPLANT
BLADE SURG 15 STRL LF DISP TIS (BLADE) ×1 IMPLANT
BLADE SURG 15 STRL SS (BLADE) ×1
CATH FOLEY 2WAY 5CC 16FR (CATHETERS) ×1
CATH URTH 16FR FL 2W BLN LF (CATHETERS) ×1 IMPLANT
DRAPE 3/4 80X56 (DRAPES) ×1 IMPLANT
DRAPE PERI LITHO V/GYN (MISCELLANEOUS) ×1 IMPLANT
DRAPE UNDER BUTTOCK W/FLU (DRAPES) ×1 IMPLANT
ELECT CAUTERY BLADE 6.4 (BLADE) ×1 IMPLANT
ELECT REM PT RETURN 9FT ADLT (ELECTROSURGICAL) ×1
ELECTRODE REM PT RTRN 9FT ADLT (ELECTROSURGICAL) ×1 IMPLANT
GAUZE 4X4 16PLY ~~LOC~~+RFID DBL (SPONGE) ×2 IMPLANT
GAUZE PACK 2X3YD (PACKING) ×1 IMPLANT
GLOVE BIO SURGEON STRL SZ 6.5 (GLOVE) ×1 IMPLANT
GLOVE INDICATOR 7.0 STRL GRN (GLOVE) ×1 IMPLANT
GOWN STRL REUS W/ TWL LRG LVL3 (GOWN DISPOSABLE) ×2 IMPLANT
GOWN STRL REUS W/TWL LRG LVL3 (GOWN DISPOSABLE) ×3
KIT TURNOVER CYSTO (KITS) ×1 IMPLANT
LABEL OR SOLS (LABEL) ×1 IMPLANT
MANIFOLD NEPTUNE II (INSTRUMENTS) ×1 IMPLANT
NDL HYPO 22X1.5 SAFETY MO (MISCELLANEOUS) ×1 IMPLANT
NDL HYPO 25GX1 SAFETY (NEEDLE) ×1 IMPLANT
NEEDLE HYPO 22X1.5 SAFETY MO (MISCELLANEOUS) ×1 IMPLANT
NEEDLE HYPO 25GX1 SAFETY (NEEDLE) IMPLANT
NS IRRIG 500ML POUR BTL (IV SOLUTION) ×1 IMPLANT
PACK BASIN MINOR ARMC (MISCELLANEOUS) ×1 IMPLANT
PAD OB MATERNITY 4.3X12.25 (PERSONAL CARE ITEMS) ×1 IMPLANT
PAD PREP OB/GYN DISP 24X41 (PERSONAL CARE ITEMS) ×1 IMPLANT
RETRACTOR PHONTONGUIDE ADAPT (ADAPTER) ×1 IMPLANT
RETRACTOR YANK SUCT EIGR SABER (INSTRUMENTS) IMPLANT
SCRUB CHG 4% DYNA-HEX 4OZ (MISCELLANEOUS) ×1 IMPLANT
SOL PREP PVP 2OZ (MISCELLANEOUS) ×1
SOLUTION PREP PVP 2OZ (MISCELLANEOUS) ×1 IMPLANT
SUT CHROMIC 0 CT 1 36 (SUTURE) ×1 IMPLANT
SUT CHROMIC 1-0 (SUTURE) ×1 IMPLANT
SUT CHROMIC 2 0 CT 1 (SUTURE) ×2 IMPLANT
SUT VIC AB 0 CT1 27 (SUTURE) ×5
SUT VIC AB 0 CT1 27XCR 8 STRN (SUTURE) ×4 IMPLANT
SUT VIC AB 0 CT1 36 (SUTURE) ×1 IMPLANT
SUT VIC AB 0 CT2 27 (SUTURE) IMPLANT
SUT VIC AB 2-0 UR6 27 (SUTURE) IMPLANT
SYR 10ML LL (SYRINGE) ×1 IMPLANT
SYR CONTROL 10ML LL (SYRINGE) ×1 IMPLANT
TAPE TRANSPORE STRL 2 31045 (GAUZE/BANDAGES/DRESSINGS) ×1 IMPLANT
TRAP FLUID SMOKE EVACUATOR (MISCELLANEOUS) ×1 IMPLANT
WATER STERILE IRR 500ML POUR (IV SOLUTION) ×1 IMPLANT

## 2023-04-04 NOTE — Op Note (Signed)
Procedure(s): TOTAL VAGINAL  HYSTERECTOMY ANTERIOR AND POSTERIOR  REPAIR Procedure Note  Jennifer Grant female 72 y.o. 04/04/2023  Indications: The patient is a 72 y.o. [id number] female with pelvic organ prolapse (Grade 3 cystocele, Grade 1-2 rectocele, and incomplete uterine prolapse) and pelvic pressure.  Has failed conservative management with pessary device.   Pre-operative Diagnosis: Pelvic organ prolapse (Grade 3 cystocele, Grade 1-2 rectocele, and incomplete uterine prolapse)  Post-operative Diagnosis: Same  Surgeon: Hildred Laser, MD  Assistants:  Surgical scrub assist x 2.   Anesthesia: General endotracheal anesthesia  Findings: Large cystocele with incomplete uterine prolapse, and small rectocele.  Uterus with several fibroids.  Unable to visualize fallopian tubes and ovaries due to possible atrophy, adhesions, and redundant vaginal and peritoneal tissues.   Procedure Details: The patient was seen in the Holding Room. The risks, benefits, complications, treatment options, and expected outcomes were discussed with the patient.  The patient concurred with the proposed plan, giving informed consent.  The site of surgery properly noted/marked. The patient was taken to the Operating Room, identified as Jennifer Grant and the procedure verified as Procedure(s) (LRB): TOTAL VAGINAL  HYSTERECTOMY (N/A), BILATERAL SALPINGO-OOPHORECTOMY, ANTERIOR AND POSTERIOR  REPAIR (N/A). A Time Out was held and the above information confirmed.  She was then placed under general anesthesia without difficulty. She was placed in the dorsal lithotomy position, and was prepped and draped in a sterile manner.  A Foley catheter was placed. A sterile speculum was inserted into the vagina and the cervix was grasped at the anterior lip using a double-toothed tenaculum.  The cervix was then circumferentially injected 10 ml of 0.5% Marcaine, and then a repeat injection was made using sterile saline. An incision  was then made circumderentially around the cervix near the cervicovesical junction with the bovie.  The posterior cul-de sac was then entered sharply with the Mayo scissors without difficulty.  Moderate peritoneal fatty tissue was encountered posteriorly making entrance into the posterior cul-de-sac more difficult due to visualization. The same procedure was performed anteriorly, and the bladder dissected of the pubovesical cervical fascia.    At this point, a Heaney clamp was placed over the uterosacaral ligaments on either side.  These were then transected and suture ligated with #0 Vicryl.  Hemostasis was assured.  The cardinal ligaments were then clamped on both sides, transected and suture ligated in a similar fashion.    The uterine arteries and the broad ligament were then serially clamped with Heaney clamps, transected and suture ligated on both sides.  Excellent hemostasis was visualized.  Attempted to invert the uterus, however possible scar tissue encountered near bladder flap region preventing rotation. The scar tissue was lysed using Metzenbaum scissors. Both cornua were clamped with Heaney clamps, transected and the uterus was delivered.  These pedicles were then suture ligated with excellent hemostasis.    Babcock clamps were used to attempt to grasp the adnexae bilaterally, however due to significant redundant vaginal tissue and concern for further scar tissue as neither ovary could be identified, and attempting to grasp the fallopian tube pedicles there was resistance that was encountered.  The decision at this time was to abandon attempts for bilateral salpingo-oophorectomy.  After this, the posterior vaginal cuff was reperitonealized using a running locked suture of 0-chromic suture, incorporating the uterosacral ligaments.  The vaginal cuff angles were closed with figure-of-eight stitches of #0 Vicryl on both sides and transfixed to the ipsilateral cardinal and uterosacral ligaments.  The  remainder of the vaginal  cuff was closed with figure-of-eight stitches of #0 Vicryl.   The anterior vagina was grasped at 2 o'clock and 8 o'clock with Alice clamps, and beginning at the apex, a midline incision was made and extended to within 1cm of the external urethral meatus. The vaginal mucosa was sharply dissected laterally. the paraurethral and paravescial fascia was mobilized by blunt and sharp dissection.  Interrupted sutures of 2-0 Vicryl were placed in the paraurethral fascia to the pubourethral ligament at the urethral-vesical junction. The remainder of the paraurethral fascia was reapproximated using similar sutures. The redundant vaginal mucosa was excised from each midline edge. The anterior vaginal mucosa was closed with interruptedsutures of 1-0 Chromic.   Attention was then turned to the posterior vaginal wall.  Two Allis clamps were placed about 2cm apart on the posterior part of the introitus.  A triangular mucosal incision from the middle of perineal body to the two Allis clamps that were placed on the introitus was made, and this portion of skin was excised.  A similar triangle was made from the areas of the introitus between the two Allis clamps and the apex of the rectocele.  The portion of vaginal mucosa overlying this central defect was also excised.  The rectocele repair was then performed in multiple layers.  The rectal endopelvic fascia was plicated in the midline using running 2-0 Vicryl sutures with care given to prevent unintentional rectal penetration.  The mucosal edges of the incision from the apex to the introitus were then reapproximated using a running suture with 0-Vicryl.  A 0 Vicryl suture was used to reapproximate the bulbocavernosus muscle on both sides and this was done.  The incisional edges from the introitus to the perineal body were then reapproximated using the remainder of the stitch from the apex of the rectocele repair.  The vaginal mucosa was noted to have  excellent hemostasis, and blood loss was minimal.     All instruments were then removed from the vagina.  The patient was then taken out of dorsal lithotomy position and awakened from general anesthesia.  The patient was taken to the PACU in stable condition.  Sponge, lap, needle, and instrument count was correct times two.     Estimated Blood Loss:  80 ml      Drains: straight catheterization prior to procedure with  250 ml of clear urine         Total IV Fluids:  800 ml  Specimens: Uterus with cervix         Implants: None         Complications:  None; patient tolerated the procedure well.         Disposition: PACU - hemodynamically stable.         Condition: stable   Hildred Laser, MD [facility]

## 2023-04-04 NOTE — Transfer of Care (Signed)
Immediate Anesthesia Transfer of Care Note  Patient: Jennifer Grant  Procedure(s) Performed: TOTAL VAGINAL  HYSTERECTOMY ANERIOR AND POSTERIOR  REPAIR  Patient Location: PACU  Anesthesia Type:General  Level of Consciousness: drowsy  Airway & Oxygen Therapy: Patient Spontanous Breathing and Patient connected to face mask oxygen  Post-op Assessment: Report given to RN and Post -op Vital signs reviewed and stable  Post vital signs: Reviewed and stable  Last Vitals:  Vitals Value Taken Time  BP 108/62 04/04/23 1130  Temp 36.1 C 04/04/23 1123  Pulse 64 04/04/23 1136  Resp 15 04/04/23 1136  SpO2 97 % 04/04/23 1136  Vitals shown include unfiled device data.  Last Pain:  Vitals:   04/04/23 1123  TempSrc:   PainSc: Asleep         Complications: No notable events documented.

## 2023-04-04 NOTE — Anesthesia Procedure Notes (Signed)
Procedure Name: Intubation Date/Time: 04/04/2023 7:41 AM  Performed by: Elisabeth Pigeon, CRNAPre-anesthesia Checklist: Patient identified, Patient being monitored, Timeout performed, Emergency Drugs available and Suction available Patient Re-evaluated:Patient Re-evaluated prior to induction Oxygen Delivery Method: Circle system utilized Preoxygenation: Pre-oxygenation with 100% oxygen Induction Type: IV induction Ventilation: Mask ventilation without difficulty Laryngoscope Size: Mac, 3 and McGraph Grade View: Grade I Tube type: Oral Tube size: 7.0 mm Number of attempts: 1 Airway Equipment and Method: Stylet and Patient positioned with wedge pillow Placement Confirmation: ETT inserted through vocal cords under direct vision, positive ETCO2 and breath sounds checked- equal and bilateral Secured at: 21 cm Tube secured with: Tape Dental Injury: Teeth and Oropharynx as per pre-operative assessment

## 2023-04-04 NOTE — Anesthesia Preprocedure Evaluation (Addendum)
Anesthesia Evaluation  Patient identified by MRN, date of birth, ID band Patient awake    Reviewed: Allergy & Precautions, H&P , NPO status , Patient's Chart, lab work & pertinent test results, reviewed documented beta blocker date and time   History of Anesthesia Complications (+) PROLONGED EMERGENCE and history of anesthetic complications  Airway Mallampati: III  TM Distance: >3 FB Neck ROM: full    Dental  (+) Teeth Intact, Poor Dentition   Pulmonary sleep apnea and Continuous Positive Airway Pressure Ventilation    Pulmonary exam normal        Cardiovascular Exercise Tolerance: Poor hypertension, On Medications + angina with exertion + CAD and + Past MI  Normal cardiovascular exam+ dysrhythmias  Rhythm:regular Rate:Normal     Neuro/Psych TIA Neuromuscular disease  negative psych ROS   GI/Hepatic Neg liver ROS,GERD  Medicated,,  Endo/Other    Morbid obesity  Renal/GU negative Renal ROS  negative genitourinary   Musculoskeletal   Abdominal   Peds  Hematology  (+) Blood dyscrasia, anemia   Anesthesia Other Findings Past Medical History: No date: (HFpEF) heart failure with preserved ejection fraction (HCC)     Comment:  a.) TTE 06/09/2015: EF 50-55%, sev LVH, sev lat HK, mild              LA dil, mild red RVSF, mild PR, G1DD; b.) TTE 07/04/2020:              EF 50-55%, mild LVH, diffuse HK, apical lat AK, mild LA               dil, triv MR, mod AoV calc, G1DD; c.) TTE 02/17/2022: EF               50-55%, LVH, LV/ mid apical antlat/apical HK, mild LA               dil, triv MR, mod MAC, triv AR, AoV scler; e.) TTE               06/22/2022: EF >55%, LVH, mod MAC, triv AR, G1DD 08/24/2003: Acute non-ST elevation myocardial infarction (NSTEMI) of  inferior wall (HCC)     Comment:  a.) LHC/PCI 08/26/2003:  95% dRCA (2.5 x 20 mm Taxus               DES), 75% dRCA (2.5 x 20 mm Taxus DES), 95% RPDA (2.5 x                16 mm Taxus DES) 06/08/2015: Acute ST elevation myocardial infarction (STEMI) of  lateral wall (HCC)     Comment:  a.) LHC/PCI 06/08/2015: overlapping 2.75 x 20 mm and 3.0              x 8 mm Promus Premier DES to OM1. PTCA of OM2. No date: Anemia No date: Anginal pain (HCC) No date: Aortic atherosclerosis (HCC) 07/04/2020: Ascending aorta dilatation (HCC)     Comment:  a.) TTE 07/04/2020: Ao root 36 mm, asc Ao 42 mm; b.) TTE              02/17/2022: asc Ao 42 mm; c.) TTE 06/22/2022: asc Ao 42               mm 08/24/2003: CAD (coronary artery disease)     Comment:  a.) PCI 08/26/03: 95 dRCA (2.5x20 mm Taxus), 75 dRCA               (2.5x20 mm Taxus), 95 RPDA (2.5x16  mm Taxus); b.) PCI               09/23/03: 95 OM1 (3.0x13 mm Cypher), 95 OM2 (2.5x20 mm               Taxus), 95 OM3 (2.5x16 mm Taxus); c.) LHC 02/17/09: 50               dLAD, 30 OM3, 50 RCA, 40 ISR dRCA, 60 ISR RPLS; d.) PCI               06/08/15: overlap 2.75x20 mm & 3.0x8 mm Promus Prem OM1.               PTCA of OM2; e.) staged PCI 07/16/15: 3.0x28 mm Promus               Prem mRCA No date: Chronic bilateral low back pain with right-sided sciatica No date: Chronic pain of both shoulders No date: Chronic pain of right knee No date: Complication of anesthesia     Comment:  a.) states "we almost lost you" with colonoscopy 2022               --> desat with assoc bradycardia (? obstruction). Pt               turned supine & placed on 100% FiO2 face mask. Propofol               infusion stopped. Atropine 0.4 mg given. Crash cart               brought in & pads placed (strong pulse throughout). HR &               sats improved. Discussed with cards "okay to proceed"               with MD in room & jaw thrust to relieve obstruction -               remainder uneventful No date: COVID No date: Cystocele without uterine prolapse No date: 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     Comment:  "got booster shot a few times" No date: GERD  (gastroesophageal reflux disease) 08/26/2003: History of heart artery stent     Comment:  a.) TOTAL OF 9 cardiac stents as of 03/30/2023 No date: Hyperlipidemia No date: Hypertension No date: Long term current use of antithrombotics/antiplatelets     Comment:  a.) on DAPT (ASA + clopidogrel) No date: Morbid obesity (HCC) No date: Noncompliance with medication regimen No date: OSA (obstructive sleep apnea)     Comment:  a.) no nocturnal PAP therapy; "never received machine"               per patient No date: Paroxysmal atrial fibrillation (HCC)     Comment:  a.) CHA2DS2VASc = 7 (age, sex, CHF, HTN, TIA x 2,               vascular disease history);  b.) rate/rhythm maintained on              oral diltiazem; no chronic anticoagulation No date: Personal history of colonic polyps No date: Pre-diabetes 08/31/2022: PSVT (paroxysmal supraventricular tachycardia)     Comment:  a.)  Zio 02/17/2022 12 runs PSVT with longest lasting >               16 minutes (max rate 142 bpm); b.) Zio 08/31/2022: 10  runs PSVT with longest lasting 54 minutes (max rate 245               bpm) No date: PVC (premature ventricular contraction) 02/17/2022: Second degree heart block     Comment:  a.) Zio 02/17/2022 - likely occurred while patient was               sleeping 06/22/2022: TIA (transient ischemic attack) No date: Vitamin D deficiency Past Surgical History: 06/08/2015: CARDIAC CATHETERIZATION; N/A     Comment:  Procedure: Left Heart Cath and Coronary Angiography;                Surgeon: Lyn Records, MD;  Location: [facility];  Service: Cardiovascular;  Laterality: N/A; 06/08/2015: CARDIAC CATHETERIZATION; N/A     Comment:  Procedure: Coronary Stent Intervention;  Surgeon: Lyn Records, MD;  Location: [facility];  Service:               Cardiovascular;  Laterality: N/A; 07/16/2015: CARDIAC CATHETERIZATION; N/A     Comment:  Procedure:  Coronary Stent Intervention;  Surgeon: Lyn Records, MD;  Location: [facility];  Service:               Cardiovascular;  Laterality: N/A; 02/17/2021: COLONOSCOPY; N/A     Comment:  Procedure: COLONOSCOPY;  Surgeon: Midge Minium, MD;                Location: [facility];  Service: Endoscopy;                Laterality: N/A; 08/26/2003: CORONARY ANGIOPLASTY WITH STENT PLACEMENT; Left     Comment:  Procedure: CORONARY ANGIOPLASTY WITH STENT PLACEMENT;               Location: [facility]; Surgeon: Cynda Acres, MD 09/23/2003: CORONARY ANGIOPLASTY WITH STENT PLACEMENT; Left     Comment:  Procedure: CORONARY ANGIOPLASTY WITH STENT PLACEMENT;               Location: [facility]; Surgeon: Gasper Sells, MD 04/30/1989: KNEE ARTHROSCOPY; Left 08/26/2003: LEFT HEART CATH AND CORONARY ANGIOGRAPHY; Left     Comment:  Procedure: LEFT HEART CATH AND CORONARY ANGIOGRAPHY;               Location: [facility]; Surgeon: Harold Hedge, MD 08/26/2005: LEFT HEART CATH AND CORONARY ANGIOGRAPHY; Left     Comment:  ProcedureL LEFT HEART CATH AND CORONARY ANGIOGRAPHY;               Location: [facility]; Surgeon: Adrian Blackwater, MD 02/17/2009: LEFT HEART CATH AND CORONARY ANGIOGRAPHY; Left     Comment:  Procedure: LEFT HEART CATH AND CORONARY ANGIOGRAPHY;               Location: [facility]; Surgeon: Lorine Bears, MD 09/23/2003: LEFT HEART CATH AND CORONARY ANGIOGRAPHY; Left     Comment:  Procedure: LEFT HEART CATH AND CORONARY ANGIOGRAPHY;               Location: [facility]; Surgeon: Adrian Blackwater, MD 11/01/2008: LEFT HEART CATH AND CORONARY ANGIOGRAPHY; Left     Comment:  Procedure: LEFT HEART CATH AND CORONARY ANGIOGRAPHY;               Location: [facility];  Surgeon: Adrian Blackwater, MD ~ 1978: TUBAL LIGATION   Reproductive/Obstetrics negative OB ROS                             Anesthesia Physical Anesthesia Plan  ASA: 4  Anesthesia Plan: General ETT   Post-op Pain Management:    Induction:    PONV Risk Score and Plan: 4 or greater  Airway Management Planned:   Additional Equipment:   Intra-op Plan:   Post-operative Plan:   Informed Consent: I have reviewed the patients History and Physical, chart, labs and discussed the procedure including the risks, benefits and alternatives for the proposed anesthesia with the patient or authorized representative who has indicated his/her understanding and acceptance.     Dental Advisory Given  Plan Discussed with: CRNA  Anesthesia Plan Comments:        Anesthesia Quick Evaluation

## 2023-04-04 NOTE — H&P (Signed)
GYNECOLOGY PREOPERATIVE HISTORY AND PHYSICAL   Subjective:  Jennifer Grant is a 72 y.o. [id number] here for surgical management of  pelvic organ prolapse. (Grade 3 cystocele, Grade 1-2 rectocele, and incomplete uterine prolapse). Patient has attempted to manage with pessary several times over the past few years, however noted difficulty with passing urine and pelvic pressure/back pain. Now desires definitive management.  Her daughter is present with her today for today's visit.     Proposed surgery: TOTAL VAGINAL HYSTERECTOMY WITH BSO  ANTERIOR AND POSTERIOR  REPAIR    Pertinent Gynecological History: Menses: post-menopausal Contraception: post menopausal status Last mammogram: normal Date: 06/15/2022 Last pap: not indicated.    Past Medical History:  Diagnosis Date   (HFpEF) heart failure with preserved ejection fraction (HCC)    a.) TTE 06/09/2015: EF 50-55%, sev LVH, sev lat HK, mild LA dil, mild red RVSF, mild PR, G1DD; b.) TTE 07/04/2020: EF 50-55%, mild LVH, diffuse HK, apical lat AK, mild LA dil, triv MR, mod AoV calc, G1DD; c.) TTE 02/17/2022: EF 50-55%, LVH, LV/ mid apical antlat/apical HK, mild LA dil, triv MR, mod MAC, triv AR, AoV scler; e.) TTE 06/22/2022: EF >55%, LVH, mod MAC, triv AR, G1DD   Acute non-ST elevation myocardial infarction (NSTEMI) of inferior wall (HCC) 08/24/2003   a.) LHC/PCI 08/26/2003:  95% dRCA (2.5 x 20 mm Taxus DES), 75% dRCA (2.5 x 20 mm Taxus DES), 95% RPDA (2.5 x 16 mm Taxus DES)   Acute ST elevation myocardial infarction (STEMI) of lateral wall (HCC) 06/08/2015   a.) LHC/PCI 06/08/2015: overlapping 2.75 x 20 mm and 3.0 x 8 mm Promus Premier DES to OM1. PTCA of OM2.   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 PTCA of OM2; e.) staged PCI 07/16/15: 3.0x28 mm Promus Prem mRCA   Chronic bilateral low back pain with right-sided sciatica    Chronic pain of both shoulders    Chronic pain of right knee    Complication of anesthesia    a.) states "we almost lost you" with colonoscopy 2022 --> desat with assoc bradycardia (? obstruction). Pt turned supine & placed on 100% FiO2 face mask. Propofol infusion stopped. Atropine 0.4 mg given. Crash cart brought in & pads placed (strong pulse throughout). HR & sats improved. Discussed with cards "okay to proceed" with MD in room & jaw thrust to relieve obstruction - remainder uneventful   COVID    Cystocele without uterine prolapse    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    "got booster shot a few times"   GERD (gastroesophageal reflux disease)    History of heart artery stent 08/26/2003   a.) TOTAL OF 9 cardiac stents as of 03/30/2023   Hyperlipidemia    Hypertension    Long term current use of antithrombotics/antiplatelets    a.) on DAPT (ASA +  clopidogrel)   Morbid obesity (HCC)    Noncompliance with medication regimen    OSA (obstructive sleep apnea)    a.) no nocturnal PAP therapy; "never received machine" per patient   Paroxysmal atrial fibrillation (HCC)    a.) CHA2DS2VASc = 7 (age, sex, CHF, HTN, TIA x 2, vascular disease history);  b.) rate/rhythm maintained on oral diltiazem; no chronic anticoagulation   Personal history of colonic polyps    Pre-diabetes    PSVT (paroxysmal supraventricular tachycardia) 08/31/2022   a.)  Zio 02/17/2022 12 runs PSVT with longest lasting > 16 minutes (max rate 142 bpm); b.) Zio 08/31/2022: 10 runs PSVT with longest lasting 54  minutes (max rate 245 bpm)   PVC (premature ventricular contraction)    Second degree heart block 02/17/2022   a.) Zio 02/17/2022 - likely occurred while patient was sleeping   TIA (transient ischemic attack) 06/22/2022   Vitamin D deficiency     Past Surgical History:  Procedure Laterality Date   CARDIAC CATHETERIZATION N/A 06/08/2015   Procedure: Left Heart Cath and Coronary Angiography;  Surgeon: Lyn Records, MD;  Location: [facility] INVASIVE CV LAB;  Service: Cardiovascular;  Laterality: N/A;   CARDIAC CATHETERIZATION N/A 06/08/2015   Procedure: Coronary Stent Intervention;  Surgeon: Lyn Records, MD;  Location: [facility] INVASIVE CV LAB;  Service: Cardiovascular;  Laterality: N/A;   CARDIAC CATHETERIZATION N/A 07/16/2015   Procedure: Coronary Stent Intervention;  Surgeon: Lyn Records, MD;  Location: [facility] INVASIVE CV LAB;  Service: Cardiovascular;  Laterality: N/A;   COLONOSCOPY N/A 02/17/2021   Procedure: COLONOSCOPY;  Surgeon: Midge Minium, MD;  Location: [facility] ENDOSCOPY;  Service: Endoscopy;  Laterality: N/A;   CORONARY ANGIOPLASTY WITH STENT PLACEMENT Left 08/26/2003   Procedure: CORONARY ANGIOPLASTY WITH STENT PLACEMENT; Location: [facility]; Surgeon: Cynda Acres, MD   CORONARY ANGIOPLASTY WITH STENT PLACEMENT Left 09/23/2003   Procedure: CORONARY ANGIOPLASTY WITH STENT PLACEMENT; Location: [facility]; Surgeon: Gasper Sells, MD   KNEE ARTHROSCOPY Left 04/30/1989   LEFT HEART CATH AND CORONARY ANGIOGRAPHY Left 08/26/2003   Procedure: LEFT HEART CATH AND CORONARY ANGIOGRAPHY; Location: [facility]; Surgeon: Harold Hedge, MD   LEFT HEART CATH AND CORONARY ANGIOGRAPHY Left 08/26/2005   ProcedureL LEFT HEART CATH AND CORONARY ANGIOGRAPHY; Location: [facility]; Surgeon: Adrian Blackwater, MD   LEFT HEART CATH AND CORONARY ANGIOGRAPHY Left 02/17/2009   Procedure: LEFT HEART CATH AND CORONARY ANGIOGRAPHY; Location: [facility]; Surgeon: Lorine Bears, MD   LEFT HEART CATH AND CORONARY ANGIOGRAPHY Left 09/23/2003   Procedure:  LEFT HEART CATH AND CORONARY ANGIOGRAPHY; Location: [facility]; Surgeon: Adrian Blackwater, MD   LEFT HEART CATH AND CORONARY ANGIOGRAPHY Left 11/01/2008   Procedure: LEFT HEART CATH AND CORONARY ANGIOGRAPHY; Location: [facility]; Surgeon: Adrian Blackwater, MD   TUBAL LIGATION  ~ 1978    OB History  Gravida Para Term Preterm AB Living  5 4 4   1 4   SAB IAB Ectopic Multiple Live Births  1       4    # Outcome Date GA Lbr Len/2nd Weight Sex Type Anes PTL Lv  5 SAB 1975          4 Term 31    F Vag-Spont   LIV  3 Term 1968    F Vag-Spont   LIV  2 Term 1966    M Vag-Spont   LIV  1 Term 1964    M Vag-Spont   LIV    Family History  Problem Relation Age of Onset   Healthy Mother  Prostate cancer Father    Kidney disease Father    Healthy Brother    Cancer Neg Hx    Diabetes Neg Hx    Heart disease Neg Hx     Social History   Socioeconomic History   Marital status: Widowed    Spouse name: Not on file   Number of children: Not on file   Years of education: Not on file   Highest education level: 11th grade  Occupational History   Occupation: retired  Tobacco Use   Smoking status: Never   Smokeless tobacco: Never  Vaping Use   Vaping status: Never Used  Substance and Sexual Activity   Alcohol use: No   Drug use: No   Sexual activity: Not Currently    Birth control/protection: Surgical  Other Topics Concern   Not on file  Social History Narrative   Not on file   Social Determinants of Health   Financial Resource Strain: Medium Risk (05/04/2022)   Overall Financial Resource Strain (CARDIA)    Difficulty of Paying Living Expenses: Somewhat hard  Food Insecurity: Food Insecurity Present (07/28/2022)   Hunger Vital Sign    Worried About Running Out of Food in the Last Year: Sometimes true    Ran Out of Food in the Last Year: Sometimes true  Transportation Needs: No Transportation Needs (07/28/2022)   PRAPARE - Administrator, Civil Service (Medical): No    Lack of  Transportation (Non-Medical): No  Physical Activity: Inactive (05/04/2022)   Exercise Vital Sign    Days of Exercise per Week: 0 days    Minutes of Exercise per Session: 0 min  Stress: No Stress Concern Present (05/04/2022)   [employer] of Occupational Health - Occupational Stress Questionnaire    Feeling of Stress : Not at all  Social Connections: Moderately Isolated (05/04/2022)   Social Connection and Isolation Panel [NHANES]    Frequency of Communication with Friends and Family: Three times a week    Frequency of Social Gatherings with Friends and Family: Three times a week    Attends Religious Services: More than 4 times per year    Active Member of Clubs or Organizations: No    Attends Banker Meetings: Never    Marital Status: Widowed  Intimate Partner Violence: Not At Risk (05/04/2022)   Humiliation, Afraid, Rape, and Kick questionnaire    Fear of Current or Ex-Partner: No    Emotionally Abused: No    Physically Abused: No    Sexually Abused: No    No current facility-administered medications on file prior to encounter.   Current Outpatient Medications on File Prior to Encounter  Medication Sig Dispense Refill   amLODipine (NORVASC) 10 MG tablet Take 1 tablet (10 mg total) by mouth daily. (Patient taking differently: Take 10 mg by mouth every morning.) 90 tablet 4   aspirin EC 81 MG tablet Take 81 mg by mouth daily.     hydrochlorothiazide (HYDRODIURIL) 25 MG tablet Take 1 tablet (25 mg total) by mouth daily. (Patient taking differently: Take 25 mg by mouth every morning.) 90 tablet 4   isosorbide mononitrate (IMDUR) 60 MG 24 hr tablet Take 1 tablet (60 mg total) by mouth daily. (Patient taking differently: Take 60 mg by mouth every morning.) 90 tablet 4   lidocaine (LIDODERM) 5 % Place 1 patch onto the skin daily. Remove & Discard patch within 12 hours or as directed by MD (Patient taking differently: Place 1 patch onto the skin  as needed. Remove & Discard patch  within 12 hours or as directed by MD) 30 patch 5   losartan (COZAAR) 100 MG tablet Take 1 tablet (100 mg total) by mouth daily. (Patient taking differently: Take 100 mg by mouth every morning.) 90 tablet 4   rosuvastatin (CRESTOR) 40 MG tablet Take 1 tablet (40 mg total) by mouth daily. (Patient taking differently: Take 40 mg by mouth daily with lunch.) 90 tablet 4   spironolactone (ALDACTONE) 25 MG tablet Take 1 tablet (25 mg total) by mouth every other day. 45 tablet 4   UNABLE TO FIND Take 1 tablet by mouth daily as needed. Neo-Cell     cholecalciferol (VITAMIN D3) 25 MCG (1000 UNIT) tablet Take 1,000 Units by mouth daily.     clopidogrel (PLAVIX) 75 MG tablet Take 1 tablet by mouth once daily (Patient taking differently: Take 75 mg by mouth daily.) 90 tablet 4   diltiazem (CARDIZEM) 30 MG tablet Take 1 tablet (30 mg total) by mouth as needed. 30 tablet 3   nitroGLYCERIN (NITROSTAT) 0.4 MG SL tablet DISSOLVE ONE TABLET UNDER THE TONGUE EVERY 5 MINUTES AS NEEDED FOR CHEST PAIN.  DO NOT EXCEED A TOTAL OF 3 DOSES IN 15 MINUTES 25 tablet 0    Allergies  Allergen Reactions   Brilinta [Ticagrelor] Shortness Of Breath   Lipitor [Atorvastatin] Itching    Mouth itching, cough   Latex Other (See Comments)    Gloves make hands look black after prolonged use   Penicillins Itching and Swelling    Tongue itching and lip swelling Has patient had a PCN reaction causing immediate rash, facial/tongue/throat swelling, SOB or lightheadedness with hypotension: yes Has patient had a PCN reaction causing severe rash involving mucus membranes or skin necrosis: No Has patient had a PCN reaction that required hospitalization No Has patient had a PCN reaction occurring within the last 10 years: No If all of the above answers are "NO", then may proceed with Cephalosporin use.   Tekturna [Aliskiren] Rash     Review of Systems Constitutional: No recent fever/chills/sweats Respiratory: No recent  cough/bronchitis Cardiovascular: No chest pain Gastrointestinal: No recent nausea/vomiting/diarrhea Genitourinary: No UTI symptoms Hematologic/lymphatic:No history of coagulopathy or recent blood thinner use    Objective:   Blood pressure (!) 142/93, pulse 75, temperature (!) 97.3 F (36.3 C), temperature source Temporal, resp. rate 18, SpO2 100%. .There is no height or weight on file to calculate BMI.  CONSTITUTIONAL: Well-developed, well-nourished female in no acute distress.  HENT:  Normocephalic, atraumatic, External right and left ear normal. Oropharynx is clear and moist EYES: Conjunctivae and EOM are normal. Pupils are equal, round, and reactive to light. No scleral icterus.  NECK: Normal range of motion, supple, no masses SKIN: Skin is warm and dry. No rash noted. Not diaphoretic. No erythema. No pallor. NEUROLOGIC: Alert and oriented to person, place, and time. Normal reflexes, muscle tone coordination. No cranial nerve deficit noted. PSYCHIATRIC: Normal mood and affect. Normal behavior. Normal judgment and thought content. CARDIOVASCULAR: Normal heart rate noted, regular rhythm RESPIRATORY: Effort and breath sounds normal, no problems with respiration noted ABDOMEN: Soft, nontender, nondistended. PELVIC: Deferred MUSCULOSKELETAL: Normal range of motion. No edema and no tenderness. 2+ distal pulses.    Labs: Results for orders placed or performed during the hospital encounter of 04/04/23 (from the past 336 hour(s))  ABO/Rh   Collection Time: 04/04/23  6:35 AM  Result Value Ref Range   ABO/RH(D) PENDING   I-STAT, chem 8  Collection Time: 04/04/23  7:17 AM  Result Value Ref Range   Sodium 136 135 - 145 mmol/L   Potassium 3.2 (L) 3.5 - 5.1 mmol/L   Chloride 99 98 - 111 mmol/L   BUN 9 8 - 23 mg/dL   Creatinine, Ser 1.61 0.44 - 1.00 mg/dL   Glucose, Bld 93 70 - 99 mg/dL   Calcium, Ion 0.96 0.45 - 1.40 mmol/L   TCO2 28 22 - 32 mmol/L   Hemoglobin 15.3 (H) 12.0 - 15.0  g/dL   HCT 40.9 81.1 - 91.4 %  Results for orders placed or performed during the hospital encounter of 03/28/23 (from the past 336 hour(s))  CBC   Collection Time: 03/28/23 10:47 AM  Result Value Ref Range   WBC 5.9 4.0 - 10.5 K/uL   RBC 5.35 (H) 3.87 - 5.11 MIL/uL   Hemoglobin 13.8 12.0 - 15.0 g/dL   HCT 78.2 95.6 - 21.3 %   MCV 81.5 80.0 - 100.0 fL   MCH 25.8 (L) 26.0 - 34.0 pg   MCHC 31.7 30.0 - 36.0 g/dL   RDW 08.6 57.8 - 46.9 %   Platelets 222 150 - 400 K/uL   nRBC 0.0 0.0 - 0.2 %  Comprehensive metabolic panel   Collection Time: 03/28/23 10:47 AM  Result Value Ref Range   Sodium 135 135 - 145 mmol/L   Potassium 3.2 (L) 3.5 - 5.1 mmol/L   Chloride 98 98 - 111 mmol/L   CO2 27 22 - 32 mmol/L   Glucose, Bld 96 70 - 99 mg/dL   BUN 12 8 - 23 mg/dL   Creatinine, Ser 6.29 0.44 - 1.00 mg/dL   Calcium 9.9 8.9 - 52.8 mg/dL   Total Protein 8.5 (H) 6.5 - 8.1 g/dL   Albumin 4.2 3.5 - 5.0 g/dL   AST 24 15 - 41 U/L   ALT 20 0 - 44 U/L   Alkaline Phosphatase 52 38 - 126 U/L   Total Bilirubin 1.0 0.3 - 1.2 mg/dL   GFR, Estimated >41 >32 mL/min   Anion gap 10 5 - 15  Type and screen   Collection Time: 03/28/23 10:47 AM  Result Value Ref Range   ABO/RH(D) AB POS    Antibody Screen NEG    Sample Expiration 04/11/2023,2359    Extend sample reason      NO TRANSFUSIONS OR PREGNANCY IN THE PAST 3 MONTHS Performed at [facility], [address] [place], [place] [address]      Imaging Studies: No results found.  Assessment:    1. 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 Pelvic pain   3. Coronary artery disease of native artery of native heart with stable angina pectoris (HCC)   4. Essential hypertension     Plan:   - Patient desires definitive management with hysterectomy.  I proposed doing a total vaginal hysterectomy (TVH) and  bilateral salpingo-oophorectomy, with anterior/posterior repair.  Patient agrees with this proposed surgery.   The risks of surgery were discussed in detail with the patient including but not limited to: bleeding which may require transfusion or reoperation; infection which may require antibiotics; injury to bowel, bladder, ureters or other surrounding organs; need for additional procedures including laparotomy or subsequent procedures secondary to abnormal pathology; formation of adhesions; thromboembolic phenomenon; incisional problems and other postoperative/anesthesia complications.  Patient was also advised that she will remain in house for 1 night; and expected recovery time after a hysterectomy is 6-8 weeks.  Patient was told that the likelihood that her condition and symptoms will be treated effectively with this surgical management was very high; the postoperative expectations were also discussed in detail. The patient also understands the alternative treatment options which were discussed in full. All questions were answered.  - Has been seen by her Cardiologist recently for h/o CAD and HTN. No concerns. Advised to continue aspirin, hold Plavix for 7 days preoperatively.  - Preop testing reviewed. -Has been NPO since midnight.    Hildred Laser, MD [facility] at [facility]

## 2023-04-04 NOTE — Anesthesia Postprocedure Evaluation (Signed)
Anesthesia Post Note  Patient: Jennifer Grant  Procedure(s) Performed: TOTAL VAGINAL  HYSTERECTOMY ANERIOR AND POSTERIOR  REPAIR  Patient location during evaluation: PACU Anesthesia Type: General Level of consciousness: awake and alert, oriented and patient cooperative Pain management: pain level controlled Vital Signs Assessment: post-procedure vital signs reviewed and stable Respiratory status: spontaneous breathing, nonlabored ventilation and respiratory function stable Cardiovascular status: blood pressure returned to baseline and stable Postop Assessment: adequate PO intake Anesthetic complications: no   No notable events documented.   Last Vitals:  Vitals:   04/04/23 1145 04/04/23 1200  BP: 105/72 120/73  Pulse: 64 65  Resp: 16 15  Temp: (!) 36.3 C (!) 36.2 C  SpO2: 100% 95%    Last Pain:  Vitals:   04/04/23 1145  TempSrc:   PainSc: 0-No pain                 Reed Breech

## 2023-04-04 NOTE — Plan of Care (Signed)
Pt reports improvement in pain after vicodin dose. VSS. Pt ambulated to BR with stand-by assist and tolerated activity well. Foley removed per order. Pt is back to room air, tolerating clears and is waiting to order dinner for tonight.

## 2023-04-05 ENCOUNTER — Encounter: Payer: Self-pay | Admitting: Obstetrics and Gynecology

## 2023-04-05 DIAGNOSIS — I480 Paroxysmal atrial fibrillation: Secondary | ICD-10-CM | POA: Diagnosis not present

## 2023-04-05 DIAGNOSIS — Z7982 Long term (current) use of aspirin: Secondary | ICD-10-CM | POA: Diagnosis not present

## 2023-04-05 DIAGNOSIS — Z91040 Latex allergy status: Secondary | ICD-10-CM | POA: Diagnosis not present

## 2023-04-05 DIAGNOSIS — Z955 Presence of coronary angioplasty implant and graft: Secondary | ICD-10-CM | POA: Diagnosis not present

## 2023-04-05 DIAGNOSIS — Z7902 Long term (current) use of antithrombotics/antiplatelets: Secondary | ICD-10-CM | POA: Diagnosis not present

## 2023-04-05 DIAGNOSIS — I110 Hypertensive heart disease with heart failure: Secondary | ICD-10-CM | POA: Diagnosis not present

## 2023-04-05 DIAGNOSIS — N8110 Cystocele, unspecified: Secondary | ICD-10-CM | POA: Diagnosis not present

## 2023-04-05 DIAGNOSIS — Z8673 Personal history of transient ischemic attack (TIA), and cerebral infarction without residual deficits: Secondary | ICD-10-CM | POA: Diagnosis not present

## 2023-04-05 DIAGNOSIS — N819 Female genital prolapse, unspecified: Secondary | ICD-10-CM | POA: Diagnosis not present

## 2023-04-05 DIAGNOSIS — Z8616 Personal history of COVID-19: Secondary | ICD-10-CM | POA: Diagnosis not present

## 2023-04-05 DIAGNOSIS — I5030 Unspecified diastolic (congestive) heart failure: Secondary | ICD-10-CM | POA: Diagnosis not present

## 2023-04-05 DIAGNOSIS — I25118 Atherosclerotic heart disease of native coronary artery with other forms of angina pectoris: Secondary | ICD-10-CM | POA: Diagnosis not present

## 2023-04-05 DIAGNOSIS — N816 Rectocele: Secondary | ICD-10-CM | POA: Diagnosis not present

## 2023-04-05 DIAGNOSIS — Z79899 Other long term (current) drug therapy: Secondary | ICD-10-CM | POA: Diagnosis not present

## 2023-04-05 MED ORDER — DOCUSATE SODIUM 100 MG PO CAPS: 100.0000 mg | ORAL_CAPSULE | Freq: Every day | ORAL | 0 refills | Status: DC | PRN

## 2023-04-05 MED ORDER — HYDROCODONE-ACETAMINOPHEN 5-325 MG PO TABS: 1.0000 | ORAL_TABLET | ORAL | 0 refills | Status: DC | PRN

## 2023-04-05 NOTE — Discharge Summary (Signed)
Gynecology Physician Postoperative Discharge Summary  Patient ID: Jennifer Grant MRN: [id number] DOB/AGE: March 23, 1951 72 y.o.  Admit Date: 04/04/2023 Discharge Date: 04/05/2023  Preoperative Diagnoses: Pelvic organ prolapse, pelvic pressure  Procedures: Procedure(s) (LRB): TOTAL VAGINAL  HYSTERECTOMY (N/A) ANERIOR AND POSTERIOR  REPAIR (N/A)  Hospital Course:  Jennifer Grant is a 72 y.o. [id number] admitted for scheduled surgery.  She underwent the procedures as mentioned above, her operation was uncomplicated. For further details about surgery, please refer to the operative report. Patient had an uncomplicated postoperative course. By time of discharge on POD#1, her pain was controlled on oral pain medications; she was ambulating, voiding without difficulty, tolerating regular diet and passing flatus. She was deemed stable for discharge to home.   Significant Labs:    Latest Ref Rng & Units 04/04/2023    7:17 AM 03/28/2023   10:47 AM 07/04/2022    4:34 PM  CBC  WBC 4.0 - 10.5 K/uL  5.9  5.7   Hemoglobin 12.0 - 15.0 g/dL 24.4  01.0  27.2   Hematocrit 36.0 - 46.0 % 45.0  43.6  45.5   Platelets 150 - 400 K/uL  222  230     Discharge Exam: Blood pressure 137/70, pulse (!) 55, temperature 97.9 F (36.6 C), temperature source Oral, resp. rate 16, SpO2 98%. General appearance: alert and no distress  Resp: clear to auscultation bilaterally  Cardio: regular rate and rhythm  GI: soft, non-tender; bowel sounds normal; no masses, no organomegaly.  Incision: C/D/I, no erythema, no drainage noted Pelvic: scant blood on pad (done in presence of RN as chaperone)  Extremities: extremities normal, atraumatic, no cyanosis or edema and Homans sign is negative, no sign of DVT  Discharged Condition: Stable  Disposition: Discharge disposition: 01-Home or Self Care       Discharge Instructions     Discharge patient   Complete by: As directed    Discharge disposition: 01-Home or Self Care    Discharge patient date: 04/05/2023      Allergies as of 04/05/2023       Reactions   Brilinta [ticagrelor] Shortness Of Breath   Lipitor [atorvastatin] Itching   Mouth itching, cough   Latex Other (See Comments)   Gloves make hands look black after prolonged use   Penicillins Itching, Swelling   Tongue itching and lip swelling Has patient had a PCN reaction causing immediate rash, facial/tongue/throat swelling, SOB or lightheadedness with hypotension: yes Has patient had a PCN reaction causing severe rash involving mucus membranes or skin necrosis: No Has patient had a PCN reaction that required hospitalization No Has patient had a PCN reaction occurring within the last 10 years: No If all of the above answers are "NO", then may proceed with Cephalosporin use.   Tekturna [aliskiren] Rash        Medication List     TAKE these medications    amLODipine 10 MG tablet Commonly known as: NORVASC Take 1 tablet (10 mg total) by mouth daily. What changed: when to take this   aspirin EC 81 MG tablet Take 81 mg by mouth daily.   cholecalciferol 25 MCG (1000 UNIT) tablet Commonly known as: VITAMIN D3 Take 1,000 Units by mouth daily.   clopidogrel 75 MG tablet Commonly known as: PLAVIX Take 1 tablet by mouth once daily   diltiazem 30 MG tablet Commonly known as: Cardizem Take 1 tablet (30 mg total) by mouth as needed.   docusate sodium 100 MG capsule Commonly known as: COLACE  Take 1 capsule (100 mg total) by mouth daily as needed.   FISH OIL PO Take 1 tablet by mouth as needed.   hydrochlorothiazide 25 MG tablet Commonly known as: HYDRODIURIL Take 1 tablet (25 mg total) by mouth daily. What changed: when to take this   HYDROcodone-acetaminophen 5-325 MG tablet Commonly known as: NORCO/VICODIN Take 1-2 tablets by mouth every 4 (four) hours as needed for moderate pain.   isosorbide mononitrate 60 MG 24 hr tablet Commonly known as: IMDUR Take 1 tablet (60 mg total) by  mouth daily. What changed: when to take this   lidocaine 5 % Commonly known as: Lidoderm Place 1 patch onto the skin daily. Remove & Discard patch within 12 hours or as directed by MD What changed:  when to take this reasons to take this   losartan 100 MG tablet Commonly known as: COZAAR Take 1 tablet (100 mg total) by mouth daily. What changed: when to take this   MAGNESIUM PO Take 1 tablet by mouth as needed.   nitroGLYCERIN 0.4 MG SL tablet Commonly known as: NITROSTAT DISSOLVE ONE TABLET UNDER THE TONGUE EVERY 5 MINUTES AS NEEDED FOR CHEST PAIN.  DO NOT EXCEED A TOTAL OF 3 DOSES IN 15 MINUTES   rosuvastatin 40 MG tablet Commonly known as: CRESTOR Take 1 tablet (40 mg total) by mouth daily. What changed: when to take this   spironolactone 25 MG tablet Commonly known as: ALDACTONE Take 1 tablet (25 mg total) by mouth every other day.   UNABLE TO FIND Take 1 tablet by mouth daily as needed. Neo-Cell   VITAMIN B12 PO Take 1 tablet by mouth as needed.       Future Appointments  Date Time Provider Department Center  04/12/2023 10:55 AM Hildred Laser, MD [facility] None  06/13/2023  1:00 PM Marjie Skiff, NP [facility]  08/02/2023  2:20 PM Iran Ouch, MD [facility] None    Follow-up Information     Hildred Laser, MD Follow up in 2 week(s).   Specialties: Obstetrics and Gynecology, Radiology Why: Post-operative check Contact information: [address] [place] [address] [phone]                 Total discharge time: 15 minutes   Signed: Hildred Laser, MD [facility] at [facility] 04/05/2023 8:03 AM

## 2023-04-05 NOTE — Progress Notes (Signed)
Pt discharged home.  Discharge instructions, prescriptions and follow up appointment given to and reviewed with pt.  Pt verbalized understanding.  Escorted by auxillary. 

## 2023-04-05 NOTE — Progress Notes (Signed)
Post-Operative Day # 1, s/p TVH, anterior/posterior repair  Subjective: no complaints, up ad lib, voiding, and tolerating PO and passing flatus. Notes pain is well controlled.    Objective: Temp:  [97 F (36.1 C)-98.5 F (36.9 C)] 97.9 F (36.6 C) (08/06 0330) Pulse Rate:  [53-68] 55 (08/06 0600) Resp:  [10-20] 16 (08/06 0600) BP: (105-137)/(69-83) 137/70 (08/06 0330) SpO2:  [90 %-100 %] 98 % (08/06 0600)  Physical Exam:  General: alert and no distress  Lungs: clear to auscultation bilaterally Heart: regular rate and rhythm, S1, S2 normal, no murmur, click, rub or gallop Abdomen: soft, non-tender; bowel sounds normal; no masses,  no organomegaly Pelvis:Bleeding: appropriate, minimal   Extremities: DVT Evaluation: No evidence of DVT seen on physical exam. Negative Homan's sign. No cords or calf tenderness. No significant calf/ankle edema.    Recent Labs    04/04/23 0717  HGB 15.3*  HCT 45.0     Lab Results  Component Value Date   CREATININE 0.90 04/04/2023     Assessment/Plan: Doing well postoperatively. Regular diet Continue to encourage ambulation Discharge home today.   Hildred Laser, MD [facility] at [facility]

## 2023-04-06 ENCOUNTER — Telehealth: Payer: Self-pay | Admitting: *Deleted

## 2023-04-06 NOTE — Patient Outreach (Signed)
  Care Coordination   Follow Up Visit Note   04/06/2023 Name: Jennifer Grant MRN: [id number] DOB: August 06, 1951  Jennifer Grant is a 72 y.o. year old female who sees [place], Corrie Dandy T, NP for primary care. I spoke with  [name] by phone today.  What matters to the patients health and wellness today?  Continue to recovery from surgery    Goals Addressed             This Visit's Progress    Management of chronic pain   On track    Care Coordination Interventions: Reviewed provider established plan for pain management Discussed importance of adherence to all scheduled medical appointments Counseled on the importance of reporting any/all new or changed pain symptoms or management strategies to pain management provider Discussed use of relaxation techniques and/or diversional activities to assist with pain reduction (distraction, imagery, relaxation, massage, acupressure, TENS, heat, and cold application Reviewed with patient prescribed pharmacological and nonpharmacological pain relief strategies          SDOH assessments and interventions completed:  No     Care Coordination Interventions:  Yes, provided   Interventions Today    Flowsheet Row Most Recent Value  Chronic Disease   Chronic disease during today's visit Other  [GYN surgery]  General Interventions   General Interventions Discussed/Reviewed General Interventions Reviewed, Doctor Visits  Doctor Visits Discussed/Reviewed Doctor Visits Reviewed, Specialist  [Will follow up with GYN on 8/13]  PCP/Specialist Visits Compliance with follow-up visit  Education Interventions   Education Provided Provided Education  Provided Verbal Education On When to see the doctor, Other  [Discussed post op complications, encouraged to take pain medications as needed as well as stool softeners to decrease risk of constipation]        Follow up plan: Follow up call scheduled for 9/6    Encounter Outcome:  Pt. Visit  Completed   Kemper Durie, RN, MSN, [facility] Care Management Care Management Coordinator [phone]

## 2023-04-12 ENCOUNTER — Ambulatory Visit (INDEPENDENT_AMBULATORY_CARE_PROVIDER_SITE_OTHER): Payer: 59 | Admitting: Obstetrics and Gynecology

## 2023-04-12 ENCOUNTER — Encounter: Payer: Self-pay | Admitting: Obstetrics and Gynecology

## 2023-04-12 VITALS — BP 147/89 | HR 61 | Resp 16 | Ht 63.0 in | Wt 225.1 lb

## 2023-04-12 DIAGNOSIS — K5900 Constipation, unspecified: Secondary | ICD-10-CM

## 2023-04-12 DIAGNOSIS — Z9889 Other specified postprocedural states: Secondary | ICD-10-CM

## 2023-04-12 DIAGNOSIS — Z4889 Encounter for other specified surgical aftercare: Secondary | ICD-10-CM

## 2023-04-12 DIAGNOSIS — Z90710 Acquired absence of both cervix and uterus: Secondary | ICD-10-CM

## 2023-04-12 MED ORDER — SENNOSIDES-DOCUSATE SODIUM 8.6-50 MG PO TABS: 1.0000 | ORAL_TABLET | Freq: Two times a day (BID) | ORAL | 1 refills | Status: DC | PRN

## 2023-04-12 NOTE — Progress Notes (Signed)
    OBSTETRICS/GYNECOLOGY POST-OPERATIVE CLINIC VISIT  Subjective:     Jennifer Grant is a 72 y.o. female who presents to the clinic 1 weeks status post  Total Vaginal Hysterectomy; Anterior and Posterior Repair  for  Pelvic Organ Prolapse . Notes that the pain that she used to feel from the prolapse is gone, is very happy. Eating a regular diet without difficulty. Bowel movements are abnormal with moderate constipation . The patient is not having any pain.  Is also noting a discharge with an odor for the past several days, but is going away.  Did note some burning yesterday but has resolved. Was dark in color like old blood.   The following portions of the patient's history were reviewed and updated as appropriate: allergies, current medications, past family history, past medical history, past social history, past surgical history, and problem list.  Review of Systems Pertinent items noted in HPI and remainder of comprehensive ROS otherwise negative.   Objective:   BP (!) 147/89   Pulse 61   Resp 16   Ht 5\' 3"  (1.6 m)   Wt 225 lb 1.6 oz (102.1 kg)   LMP  (LMP Unknown)   BMI 39.87 kg/m  There is no height or weight on file to calculate BMI.  General:  alert and no distress  Abdomen: soft, bowel sounds active, non-tender  Incision:   healing well, no drainage, no erythema, no hernia, no seroma, no swelling, no dehiscence, incision well approximated    Pathology:  Uterus and cervix - ENDOCERVIX WITH NABOTHIAN CYSTS AND MUCOSAL CHANGES CONSISTENT WITH PROVIDED HISTORY OF PROLAPSE. - INACTIVE ENDOMETRIUM WITH 2.3 CM ENDOMETRIAL POLYP. 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 - FIBROUS SEROSAL ADHESIONS. - NEGATIVE FOR MALIGNANCY.  Assessment:   Patient s/p Total Vaginal Hysterectomy; Anterior and Posterior Repair (surgery)  Doing well postoperatively. Constipation   Plan:   1. Continue any current medications as instructed by provider. Prescribed Senna Kot  tablets for constipation. Patient notes she will also resume juice regimen (prune/apple) as this worked for her in the hospital.  2. Wound care discussed. 3. Operative findings again reviewed. Pathology report discussed. 4. Activity restrictions: no bending, stooping, or squatting, no lifting more than 10-15  pounds, and pelvic rest 5. Anticipated return to work: not applicable. 6. Follow up: 5 weeks for 6 week post-op follow up    Hildred Laser, MD [place] [facility]

## 2023-04-18 ENCOUNTER — Telehealth: Payer: Self-pay

## 2023-04-18 NOTE — Telephone Encounter (Signed)
Dr. Valentino Saxon is out of the office until Wednesday 04/20/23. Discussed with Dr. Marice Potter. She will see patient at 8 am tomorrow 04/19/23. Patient aware. Appointment scheduled.

## 2023-04-18 NOTE — Telephone Encounter (Signed)
Patient states she is having irritation inside her vagina on the left side (feels like a stitch poking her) She also noticed a runny curd like discharge.

## 2023-04-19 ENCOUNTER — Other Ambulatory Visit (HOSPITAL_COMMUNITY)
Admission: RE | Admit: 2023-04-19 | Discharge: 2023-04-19 | Disposition: A | Discharge status: Home / Self Care | Payer: 59 | Source: Ambulatory Visit | Attending: Obstetrics & Gynecology | Admitting: Obstetrics & Gynecology

## 2023-04-19 ENCOUNTER — Encounter: Payer: Self-pay | Admitting: Obstetrics & Gynecology

## 2023-04-19 ENCOUNTER — Ambulatory Visit (INDEPENDENT_AMBULATORY_CARE_PROVIDER_SITE_OTHER): Payer: 59 | Admitting: Obstetrics & Gynecology

## 2023-04-19 VITALS — BP 167/82 | HR 61 | Ht 63.0 in | Wt 221.0 lb

## 2023-04-19 DIAGNOSIS — N898 Other specified noninflammatory disorders of vagina: Secondary | ICD-10-CM

## 2023-04-19 DIAGNOSIS — Z90710 Acquired absence of both cervix and uterus: Secondary | ICD-10-CM

## 2023-04-19 NOTE — Progress Notes (Signed)
   Established Patient Office Visit  Subjective   Patient ID: Jennifer Grant, female    DOB: 06/12/1951  Age: 72 y.o. MRN: [id number]  No chief complaint on file.   HPI   72 yo P4 here at the office for a work in visit for vaginal discharge with odor. She had a TVH, anterior and posterior repair on 04/04/2023 with Dr. Valentino Saxon. She had a post op visit 04/12/2023 and was doing well.  She also feels like there is a sharp "poking" "like a needle" at her introitus.   Objective:     Physical Exam   Well nourished, well hydrated Black female, no apparent distress She is ambulating and conversing normally. Spec exam reveals vagina healing well, sutures intact at the cuff. No evidence of infection, just a bit of old blood Long suture knot at the introitus, the cause of her discomfort. I removed the knot easily with suture scissors.  Assessment & Plan:  Post op vaginal discharge- Aptima sent I think the odor is from old blood.  Problem List Items Addressed This Visit   None   She has a scheduled postop visit with Dr. Valentino Saxon.   Allie Bossier, MD

## 2023-04-20 LAB — CERVICOVAGINAL ANCILLARY ONLY
Bacterial Vaginitis (gardnerella): POSITIVE — AB
Candida Glabrata: NEGATIVE
Candida Vaginitis: NEGATIVE
Comment: NEGATIVE
Comment: NEGATIVE
Comment: NEGATIVE
Comment: NEGATIVE
Trichomonas: NEGATIVE

## 2023-04-25 ENCOUNTER — Other Ambulatory Visit: Payer: Self-pay | Admitting: Obstetrics & Gynecology

## 2023-04-25 ENCOUNTER — Encounter: Payer: Self-pay | Admitting: Obstetrics & Gynecology

## 2023-04-25 DIAGNOSIS — N760 Acute vaginitis: Secondary | ICD-10-CM

## 2023-04-25 MED ORDER — METRONIDAZOLE 500 MG PO TABS: 500.0000 mg | ORAL_TABLET | Freq: Two times a day (BID) | ORAL | 0 refills | Status: DC

## 2023-04-25 NOTE — Progress Notes (Signed)
Flagly prescribed to treat bv seen on Apltima testing. My chart message sent.

## 2023-05-06 ENCOUNTER — Ambulatory Visit: Payer: Self-pay | Admitting: *Deleted

## 2023-05-06 NOTE — Patient Outreach (Signed)
  Care Coordination   05/06/2023 Name: GESSELLE KRYGOWSKI MRN: [id number] DOB: 03-06-1951   Care Coordination Outreach Attempts:  An unsuccessful telephone outreach was attempted for a scheduled appointment today.  Follow Up Plan:  Additional outreach attempts will be made to offer the patient care coordination information and services.   Encounter Outcome:  No Answer   Care Coordination Interventions:  No, not indicated    Kemper Durie, RN, MSN, [facility] Care Management Care Management Coordinator [phone]

## 2023-05-12 ENCOUNTER — Telehealth: Payer: Self-pay | Admitting: *Deleted

## 2023-05-12 NOTE — Progress Notes (Signed)
  Care Coordination Note  05/12/2023 Name: Jennifer Grant MRN: [id number] DOB: 09-Feb-1951  Jennifer Grant is a 71 y.o. year old female who is a primary care patient of Marjie Skiff, NP and is actively engaged with the care management team. I reached out to [name] by phone today to assist with re-scheduling a follow up visit with the RN Case Manager  Follow up plan: Unsuccessful telephone outreach attempt made. A HIPAA compliant phone message was left for the patient providing contact information and requesting a return call.   Burman Nieves, CCMA Care Coordination Care Guide Direct Dial: [phone]

## 2023-05-17 ENCOUNTER — Encounter: Payer: Self-pay | Admitting: Obstetrics and Gynecology

## 2023-05-17 ENCOUNTER — Ambulatory Visit (INDEPENDENT_AMBULATORY_CARE_PROVIDER_SITE_OTHER): Payer: 59 | Admitting: Obstetrics and Gynecology

## 2023-05-17 VITALS — BP 134/82 | HR 69 | Ht 63.0 in | Wt 226.3 lb

## 2023-05-17 DIAGNOSIS — Z9889 Other specified postprocedural states: Secondary | ICD-10-CM

## 2023-05-17 DIAGNOSIS — Z4889 Encounter for other specified surgical aftercare: Secondary | ICD-10-CM

## 2023-05-17 DIAGNOSIS — Z90710 Acquired absence of both cervix and uterus: Secondary | ICD-10-CM

## 2023-05-17 NOTE — Progress Notes (Signed)
    OBSTETRICS/GYNECOLOGY POST-OPERATIVE CLINIC VISIT  Subjective:     Jennifer Grant is a 72 y.o. female who presents to the clinic 6 weeks status post  total vaginal hysterectomy with bilateral salpingectomy anterior and posterior repair  for pelvic organ prolapse and incomplete uterine prolapse. Eating a regular diet with difficulty. Bowel movements are abnormal with patient reporting GI upset and abdominal discomfort that she states is intermittent . Pain is controlled without any medications. Patient reports today pink/bloody show for two days as well as vaginal odor. Notes that she was seen several weeks ago due to feeling like something was poking her in her vagina and vaginal odor. Was diagnosed with BV, and took only several days of the medication as it mixed with her other medications and made her sick so she stopped them.   The following portions of the patient's history were reviewed and updated as appropriate: allergies, current medications, past medical history, past surgical history, and problem list.  Review of Systems Pertinent items noted in HPI and remainder of comprehensive ROS otherwise negative.   Objective:   BP (!) 142/73   Pulse 79   Wt 226 lb 4.8 oz (102.6 kg)   LMP  (LMP Unknown)   BMI 40.09 kg/m  Body mass index is 40.09 kg/m.  General:  alert and no distress  Abdomen: soft, bowel sounds active, non-tender  Pelvis:   External genitalia normal. Vaginal cuff healing well, no drainage, no erythema, no discharge. Small area of granulation tissue noted along left side of cuff, treated with silver nitrate stick.     Pathology:  Previously reviewed, benign  Assessment:   Post-op visit S/p vaginal hysterectomy with bilateral salpingectomy, A/P repair   Plan:   1.Doing well post-operatively. 2. Activity restrictions: none 3. Anticipated return to work: not applicable. 4. No evidence of active vaginal infection at this time. No refill on antibiotics  needed.  5. Follow up:  as needed.  Continue to encourage routine health maintenance      Hildred Laser, MD [place] [facility] at [facility]

## 2023-05-31 ENCOUNTER — Other Ambulatory Visit (HOSPITAL_COMMUNITY): Payer: Medicare Other

## 2023-06-10 ENCOUNTER — Ambulatory Visit: Payer: Self-pay | Admitting: *Deleted

## 2023-06-10 NOTE — Patient Outreach (Signed)
  Care Coordination   Follow Up Visit Note   06/10/2023 Name: Jennifer Grant MRN: [id number] DOB: July 08, 1951  Jennifer Grant is a 72 y.o. year old female who sees [place], Corrie Dandy T, NP for primary care. I spoke with  [name] by phone today.  What matters to the patients health and wellness today?  Report she is doing well, has recovered from surgery. Denies any urgent concerns, encouraged to contact this care manager with questions.     Goals Addressed             This Visit's Progress    COMPLETED: Management of chronic pain       Care Coordination Interventions: Reviewed provider established plan for pain management Discussed importance of adherence to all scheduled medical appointments Counseled on the importance of reporting any/all new or changed pain symptoms or management strategies to pain management provider Discussed use of relaxation techniques and/or diversional activities to assist with pain reduction (distraction, imagery, relaxation, massage, acupressure, TENS, heat, and cold application Reviewed with patient prescribed pharmacological and nonpharmacological pain relief strategies          SDOH assessments and interventions completed:  No     Care Coordination Interventions:  Yes, provided   Follow up plan: No further intervention required.   Encounter Outcome:  Patient Visit Completed   Kemper Durie RN, MSN, CCM [place]  [facility], [facility] RN Care Coordinator Direct Dial: [phone] / Main [phone] Fax [phone] Email: [email or web address]: [email or web address]

## 2023-06-13 ENCOUNTER — Encounter: Payer: 59 | Admitting: Nurse Practitioner

## 2023-06-13 DIAGNOSIS — I5032 Chronic diastolic (congestive) heart failure: Secondary | ICD-10-CM

## 2023-06-13 DIAGNOSIS — E782 Mixed hyperlipidemia: Secondary | ICD-10-CM

## 2023-06-13 DIAGNOSIS — I77810 Thoracic aortic ectasia: Secondary | ICD-10-CM

## 2023-06-13 DIAGNOSIS — I252 Old myocardial infarction: Secondary | ICD-10-CM

## 2023-06-13 DIAGNOSIS — I25118 Atherosclerotic heart disease of native coronary artery with other forms of angina pectoris: Secondary | ICD-10-CM

## 2023-06-13 DIAGNOSIS — E559 Vitamin D deficiency, unspecified: Secondary | ICD-10-CM

## 2023-06-13 DIAGNOSIS — R7309 Other abnormal glucose: Secondary | ICD-10-CM

## 2023-06-13 DIAGNOSIS — Z23 Encounter for immunization: Secondary | ICD-10-CM

## 2023-06-13 DIAGNOSIS — Z0000 Encounter for general adult medical examination without abnormal findings: Secondary | ICD-10-CM

## 2023-06-13 DIAGNOSIS — Z91148 Patient's other noncompliance with medication regimen for other reason: Secondary | ICD-10-CM

## 2023-06-13 DIAGNOSIS — I10 Essential (primary) hypertension: Secondary | ICD-10-CM

## 2023-06-13 DIAGNOSIS — I471 Supraventricular tachycardia, unspecified: Secondary | ICD-10-CM

## 2023-06-16 NOTE — Progress Notes (Signed)
Pt visit completed by RN on 06/10/2023

## 2023-06-23 ENCOUNTER — Telehealth: Payer: Self-pay | Admitting: Nurse Practitioner

## 2023-06-23 NOTE — Telephone Encounter (Signed)
Called patient to give number to schedule mammogram unable to reach no voice mail set up. Sent patient a Clinical cytogeneticist message with information.

## 2023-06-23 NOTE — Telephone Encounter (Signed)
Copied from CRM [id number]. Topic: General - Other >> Jun 23, 2023  9:39 AM Phill Myron wrote: Ms Nemeroff would like to have a mammogram. Please advise

## 2023-07-04 ENCOUNTER — Telehealth: Payer: Self-pay | Admitting: Nurse Practitioner

## 2023-07-04 DIAGNOSIS — Z1231 Encounter for screening mammogram for malignant neoplasm of breast: Secondary | ICD-10-CM

## 2023-07-04 NOTE — Telephone Encounter (Signed)
Copied from CRM [id number]. Topic: Referral - Request for Referral >> Jul 04, 2023 10:15 AM Turkey B wrote: Reason for CRM: routine mammogram/2nd request  Has patient seen PCP for this complaint? no *If NO, is insurance requiring patient see PCP for this issue before PCP can refer them? Referral for which specialty: mammogram Preferred provider/office: ? Reason for referral: routine mammogram

## 2023-07-05 NOTE — Telephone Encounter (Signed)
Called patient to number can not be reached

## 2023-07-06 DIAGNOSIS — S0501XA Injury of conjunctiva and corneal abrasion without foreign body, right eye, initial encounter: Secondary | ICD-10-CM | POA: Diagnosis not present

## 2023-07-08 DIAGNOSIS — H16141 Punctate keratitis, right eye: Secondary | ICD-10-CM | POA: Diagnosis not present

## 2023-07-15 DIAGNOSIS — S0501XA Injury of conjunctiva and corneal abrasion without foreign body, right eye, initial encounter: Secondary | ICD-10-CM | POA: Diagnosis not present

## 2023-07-17 ENCOUNTER — Other Ambulatory Visit: Payer: Self-pay | Admitting: Nurse Practitioner

## 2023-07-17 DIAGNOSIS — I10 Essential (primary) hypertension: Secondary | ICD-10-CM

## 2023-07-18 ENCOUNTER — Other Ambulatory Visit: Payer: Self-pay | Admitting: Nurse Practitioner

## 2023-07-18 DIAGNOSIS — I10 Essential (primary) hypertension: Secondary | ICD-10-CM

## 2023-07-18 NOTE — Telephone Encounter (Signed)
Requested Prescriptions  Pending Prescriptions Disp Refills   losartan (COZAAR) 100 MG tablet [Pharmacy Med Name: Losartan Potassium 100 MG Oral Tablet] 90 tablet 0    Sig: Take 1 tablet by mouth once daily     Cardiovascular:  Angiotensin Receptor Blockers Failed - 07/17/2023  4:40 PM      Failed - K in normal range and within 180 days    Potassium  Date Value Ref Range Status  04/04/2023 3.2 (L) 3.5 - 5.1 mmol/L Final         Passed - Cr in normal range and within 180 days    Creat  Date Value Ref Range Status  07/23/2015 0.70 0.50 - 0.99 mg/dL Final   Creatinine, Ser  Date Value Ref Range Status  04/04/2023 0.90 0.44 - 1.00 mg/dL Final         Passed - Patient is not pregnant      Passed - Last BP in normal range    BP Readings from Last 1 Encounters:  05/17/23 134/82         Passed - Valid encounter within last 6 months    Recent Outpatient Visits           5 months ago GERD without esophagitis   [place] [facility] [place], Elizabeth T, NP   6 months ago Chronic pain of both shoulders   [place] [facility] [place], [place] T, NP   7 months ago Chronic heart failure with preserved ejection fraction (HCC)   [place] [facility] [place], [place] T, NP   10 months ago Chronic heart failure with preserved ejection fraction (HCC)   [place] [facility] [place], Corrie Dandy T, NP   1 year ago Chronic diastolic heart failure (HCC)   [place] [facility] [place], Corrie Dandy T, NP       Future Appointments             Tomorrow [place], Corrie Dandy T, NP [place] [facility]   In 2 weeks Iran Ouch, MD [place] [facility]             rosuvastatin (CRESTOR) 40 MG tablet [Pharmacy Med Name: Rosuvastatin Calcium 40 MG Oral Tablet] 90 tablet 2    Sig: Take 1 tablet by mouth once daily     Cardiovascular:  Antilipid - Statins 2 Failed - 07/17/2023  4:40 PM       Failed - Lipid Panel in normal range within the last 12 months    Cholesterol, Total  Date Value Ref Range Status  12/07/2022 144 100 - 199 mg/dL Final   Cholesterol Piccolo, Waived  Date Value Ref Range Status  12/28/2017 206 (H) <200 mg/dL Final    Comment:                            Desirable                <200                         Borderline High      200- 239                         High                     >239  LDL Chol Calc (NIH)  Date Value Ref Range Status  12/07/2022 86 0 - 99 mg/dL Final   HDL  Date Value Ref Range Status  12/07/2022 41 >39 mg/dL Final   Triglycerides  Date Value Ref Range Status  12/07/2022 89 0 - 149 mg/dL Final   Triglycerides Piccolo,Waived  Date Value Ref Range Status  12/28/2017 136 <150 mg/dL Final    Comment:                            Normal                   <150                         Borderline High     150 - 199                         High                200 - 499                         Very High                >499          Passed - Cr in normal range and within 360 days    Creat  Date Value Ref Range Status  07/23/2015 0.70 0.50 - 0.99 mg/dL Final   Creatinine, Ser  Date Value Ref Range Status  04/04/2023 0.90 0.44 - 1.00 mg/dL Final         Passed - Patient is not pregnant      Passed - Valid encounter within last 12 months    Recent Outpatient Visits           5 months ago GERD without esophagitis   [place] [facility] [place], [place] T, NP   6 months ago Chronic pain of both shoulders   [place] [facility] [place], [place] T, NP   7 months ago Chronic heart failure with preserved ejection fraction (HCC)   [place] [facility] [place], [place] T, NP   10 months ago Chronic heart failure with preserved ejection fraction (HCC)   [place] [facility] [place], Corrie Dandy T, NP   1 year ago Chronic diastolic heart failure (HCC)   [place]  [facility] [place], Dorie Rank, NP       Future Appointments             Tomorrow Marjie Skiff, NP [place] [facility], [facility]   In 2 weeks Iran Ouch, MD [facility]

## 2023-07-19 ENCOUNTER — Other Ambulatory Visit: Payer: Self-pay | Admitting: Nurse Practitioner

## 2023-07-19 ENCOUNTER — Ambulatory Visit: Payer: 59 | Admitting: Nurse Practitioner

## 2023-07-19 ENCOUNTER — Encounter: Payer: Self-pay | Admitting: Nurse Practitioner

## 2023-07-19 VITALS — BP 130/75 | HR 64 | Temp 98.0°F | Ht 62.5 in | Wt 225.6 lb

## 2023-07-19 DIAGNOSIS — I10 Essential (primary) hypertension: Secondary | ICD-10-CM

## 2023-07-19 DIAGNOSIS — I700 Atherosclerosis of aorta: Secondary | ICD-10-CM | POA: Diagnosis not present

## 2023-07-19 DIAGNOSIS — I5032 Chronic diastolic (congestive) heart failure: Secondary | ICD-10-CM

## 2023-07-19 DIAGNOSIS — K219 Gastro-esophageal reflux disease without esophagitis: Secondary | ICD-10-CM

## 2023-07-19 DIAGNOSIS — Z23 Encounter for immunization: Secondary | ICD-10-CM

## 2023-07-19 DIAGNOSIS — E559 Vitamin D deficiency, unspecified: Secondary | ICD-10-CM

## 2023-07-19 DIAGNOSIS — Z0000 Encounter for general adult medical examination without abnormal findings: Secondary | ICD-10-CM

## 2023-07-19 DIAGNOSIS — Z6841 Body Mass Index (BMI) 40.0 and over, adult: Secondary | ICD-10-CM

## 2023-07-19 DIAGNOSIS — R7309 Other abnormal glucose: Secondary | ICD-10-CM | POA: Diagnosis not present

## 2023-07-19 DIAGNOSIS — E785 Hyperlipidemia, unspecified: Secondary | ICD-10-CM

## 2023-07-19 DIAGNOSIS — I25118 Atherosclerotic heart disease of native coronary artery with other forms of angina pectoris: Secondary | ICD-10-CM

## 2023-07-19 DIAGNOSIS — E782 Mixed hyperlipidemia: Secondary | ICD-10-CM

## 2023-07-19 DIAGNOSIS — I471 Supraventricular tachycardia, unspecified: Secondary | ICD-10-CM

## 2023-07-19 DIAGNOSIS — I77810 Thoracic aortic ectasia: Secondary | ICD-10-CM | POA: Diagnosis not present

## 2023-07-19 MED ORDER — HYDROCHLOROTHIAZIDE 25 MG PO TABS: 25.0000 mg | ORAL_TABLET | Freq: Every day | ORAL | 4 refills | Status: DC

## 2023-07-19 MED ORDER — CLOPIDOGREL BISULFATE 75 MG PO TABS: 75.0000 mg | ORAL_TABLET | Freq: Every day | ORAL | 4 refills | Status: DC

## 2023-07-19 MED ORDER — ISOSORBIDE MONONITRATE ER 60 MG PO TB24: 60.0000 mg | ORAL_TABLET | Freq: Every day | ORAL | 4 refills | Status: DC

## 2023-07-19 MED ORDER — LOSARTAN POTASSIUM 100 MG PO TABS: 100.0000 mg | ORAL_TABLET | Freq: Every day | ORAL | 4 refills | Status: AC

## 2023-07-19 MED ORDER — AMLODIPINE BESYLATE 10 MG PO TABS: 10.0000 mg | ORAL_TABLET | Freq: Every day | ORAL | 4 refills | Status: DC

## 2023-07-19 MED ORDER — SPIRONOLACTONE 25 MG PO TABS: 25.0000 mg | ORAL_TABLET | ORAL | 4 refills | Status: DC

## 2023-07-19 MED ORDER — DILTIAZEM HCL 30 MG PO TABS: 30.0000 mg | ORAL_TABLET | ORAL | 3 refills | Status: DC | PRN

## 2023-07-19 NOTE — Patient Instructions (Signed)
Please call to schedule your mammogram and/or bone density: [facility] at [facility]  Address: [address], [place], [place] [address] Phone: [phone]  [facility] at [facility] [address] [place],  [place]  [address] Phone: [phone]    Heart Failure Action Plan A heart failure action plan helps you understand what to do when you have symptoms of heart failure. Your action plan is a color-coded plan that lists the symptoms to watch for and indicates what actions to take. If you have symptoms in the red zone, you need medical care right away. If you have symptoms in the yellow zone, you are having problems. If you have symptoms in the green zone, you are doing well. Follow the plan that was created by you and your health care provider. Review your plan each time you visit your health care provider. Red zone These signs and symptoms mean you should get medical help right away: You have trouble breathing when resting. You have a dry cough that is getting worse. You have swelling or pain in your legs or abdomen that is getting worse. You suddenly gain more than 2-3 lb (0.9-1.4 kg) in 24 hours, or more than 5 lb (2.3 kg) in a week. This amount may be more or less depending on your condition. You have trouble staying awake or you feel confused. You have chest pain. You do not have an appetite. You pass out. You have worsening sadness or depression. If you have any of these symptoms, call your local emergency services (911 in the [place] right away. Do not drive yourself to the hospital. Yellow zone These signs and symptoms mean your condition may be getting worse and you should make some changes: You have trouble breathing when you are active, or you need to sleep with your head raised on extra pillows to help you breathe. You have swelling in your legs or abdomen. You gain 2-3 lb (0.9-1.4 kg) in 24 hours, or 5 lb (2.3 kg) in a  week. This amount may be more or less depending on your condition. You get tired easily. You have trouble sleeping. You have a dry cough. If you have any of these symptoms: Contact your health care provider within the next day. Your health care provider may adjust your medicines. Green zone These signs mean you are doing well and can continue what you are doing: You do not have shortness of breath. You have very little swelling or no new swelling. Your weight is stable (no gain or loss). You have a normal activity level. You do not have chest pain or any other new symptoms. Follow these instructions at home: Take over-the-counter and prescription medicines only as told by your health care provider. Weigh yourself daily. Your target weight is __________ lb (__________ kg). Call your health care provider if you gain more than __________ lb (__________ kg) in 24 hours, or more than __________ lb (__________ kg) in a week. Health care provider name: _____________________________________________________ Health care provider phone number: _____________________________________________________ Eat a heart-healthy diet. Work with a diet and nutrition specialist (dietitian) to create an eating plan that is best for you. Keep all follow-up visits. This is important. Where to find more information American Heart Association: Summary A heart failure action plan helps you understand what to do when you have symptoms of heart failure. Follow the action plan that was created by you and your health care provider. Get help right away if  you have any symptoms in the red zone. This information is not intended to replace advice given to you by your health care provider. Make sure you discuss any questions you have with your health care provider. Document Revised: 11/24/2021 Document Reviewed: 03/31/2020 Elsevier Patient Education  2024 [employer].

## 2023-07-19 NOTE — Assessment & Plan Note (Signed)
Chronic, ongoing.  Continue current medication regimen and adjust as needed.  Lipid panel today.  Have recommended she take this daily to help with prevention.  Discussed this at length with patient.  Consider addition of Zetia if elevations above goal, although she refuses this today.

## 2023-07-19 NOTE — Assessment & Plan Note (Signed)
BMI 40.61 with HTN, OLD MI, HLD. Recommended eating smaller high protein, low fat meals more frequently and exercising 30 mins a day 5 times a week with a goal of 10-15lb weight loss in the next 3 months. Patient voiced their understanding and motivation to adhere to these recommendations.

## 2023-07-19 NOTE — Assessment & Plan Note (Signed)
Stable.  Followed by cardiology, continue this collaboration.  Tried taking Metoprolol, reports she never could tolerate.

## 2023-07-19 NOTE — Assessment & Plan Note (Signed)
Ongoing, continue collaboration with cardiology. Recent noted reviewed.

## 2023-07-19 NOTE — Assessment & Plan Note (Signed)
A1c 6.3% -- recheck today, continue Losartan for kidney protection and recommend patient focus heavily on diet changes and regular activity.

## 2023-07-19 NOTE — Assessment & Plan Note (Signed)
Ongoing, recommend continue daily supplement and adjust as needed.  Recheck level today.  DEXA was normal range in October 2023, repeat in 10 years, sooner if fracture presents.

## 2023-07-19 NOTE — Assessment & Plan Note (Signed)
Chronic, ongoing.  Euvolemic on exam.  Continue current medication regimen and collaboration with cardiology.  Discussed diet choices and to cut back on sodium intake, she culturally eats a higher sodium diet with lots of spicier foods.   - Reminded to call for an overnight weight gain of >2 pounds or a weekly weight weight of >5 pounds - not adding salt to his food and has been reading food labels. Reviewed the importance of keeping daily sodium intake to 2000mg  daily  - No Ibuprofen products

## 2023-07-19 NOTE — Assessment & Plan Note (Signed)
Ongoing, stable.  Discussed diet choices and to cut back on spicy food intake, she culturally eats a higher sodium diet with lots of spicier foods.  Would prefer continued focus on turmeric and lemon.  Will continue this, which is offering benefit and if worsening consider GI referral.

## 2023-07-19 NOTE — Progress Notes (Signed)
BP 130/75 (BP Location: Left Arm, Cuff Size: Large)   Pulse 64   Temp 98 F (36.7 C) (Oral)   Ht 5' 2.5" (1.588 m)   Wt 225 lb 9.6 oz (102.3 kg)   LMP  (LMP Unknown)   SpO2 98%   BMI 40.61 kg/m    Subjective:    Patient ID: Jennifer Grant, female    DOB: 1951-04-25, 72 y.o.   MRN: [id number]  HPI: Jennifer Grant is a 72 y.o. female presenting on 07/19/2023 for Medicare Wellness and physical. Current medical complaints include:none  She currently lives with: Menopausal Symptoms: no  HYPERTENSION / HYPERLIPIDEMIA/HF Taking Losartan, Amlodipine, HCTZ, Isosorbide Mono, Spironolactone, Plavix, and ASA + Rosuvastatin. Endorses a poor diet at times as enjoys cooks with spices which has lots of salt.  Took Metoprolol XL 25 MG daily in past due to PAF while in hospital for MI, made her feel sick and tired all the time.  Saw cardiology on 01/27/23 = tto consider trial of Carvedilol in future and consider adding Zetia.  Returns on 08/02/23.  Diltiazem to take as needed for elevation in BP, but has never taken. No recent NTG use. October 2023 echo EF >55% with normal LVH, chronic diastolic HF diagnosis on chart. History of MI in 2004.     History of sleep study noting OSA, but reports she never obtained CPAP equipment and does not want to bother with it.  She sleeps on no pillows, sleeps flat. Satisfied with current treatment? yes Duration of hypertension: chronic BP monitoring frequency: a few times a month BP range: <130/80 BP medication side effects: no Duration of hyperlipidemia: chronic Cholesterol medication side effects: no Cholesterol supplements: none Medication compliance: good compliance Aspirin: yes Recent stressors: no Recurrent headaches: no Visual changes: no Palpitations: no Dyspnea: no Chest pain: no Lower extremity edema: no Dizzy/lightheaded: no   Impaired Fasting Glucose No sweet buns recently, does enjoy these at [place]. HbA1C:  Lab Results  Component  Value Date   HGBA1C 6.3 (H) 12/07/2022  Duration of elevated blood sugar:  Polydipsia: no Polyuria: no Weight change: no Visual disturbance: no Glucose Monitoring: no    Accucheck frequency: Not Checking    Fasting glucose:     Post prandial:  Diabetic Education: Not Completed Family history of diabetes: no   GERD  Only if she eats salty or spicy food. GERD control status: stable Satisfied with current treatment? yes Heartburn frequency:  Medication side effects: no  Medication compliance: stable Previous GERD medications: Antacid use frequency:   Duration:  Nature:  Location:  Heartburn duration:  Alleviatiating factors:   Aggravating factors:  Dysphagia: no Odynophagia:  no Hematemesis: no Blood in stool: no EGD: no   Depression Screen done today and results listed below:     07/19/2023    1:34 PM 05/04/2022    9:01 AM 05/04/2022    8:49 AM 05/01/2021    8:45 AM 10/03/2020    1:21 PM  Depression screen PHQ 2/9  Decreased Interest 0 0 0 0 0  Down, Depressed, Hopeless 0 0 0 0 0  PHQ - 2 Score 0 0 0 0 0  Altered sleeping 0    0  Tired, decreased energy 0    0  Change in appetite 0    0  Feeling bad or failure about yourself  0    0  Trouble concentrating 0    0  Moving slowly or fidgety/restless 0    0  Suicidal thoughts 0    0  PHQ-9 Score 0    0  Difficult doing work/chores Not difficult at all          07/19/2023    1:35 PM  GAD 7 : Generalized Anxiety Score  Nervous, Anxious, on Edge 0  Control/stop worrying 0  Worry too much - different things 0  Trouble relaxing 0  Restless 0  Easily annoyed or irritable 0  Afraid - awful might happen 0  Total GAD 7 Score 0  Anxiety Difficulty Not difficult at all        07/07/2022   12:30 PM 07/21/2022    4:15 PM 12/15/2022   10:54 AM 07/19/2023    1:26 PM 07/19/2023    1:34 PM  Fall Risk  Falls in the past year?   0 0 0  Was there an injury with Fall?   0 0 0  Fall Risk Category Calculator   0 0 0   (RETIRED) Patient Fall Risk Level Low fall risk Low fall risk     Patient at Risk for Falls Due to   No Fall Risks No Fall Risks No Fall Risks  Fall risk Follow up   Falls evaluation completed Falls evaluation completed Falls evaluation completed    Functional Status Survey: Is the patient deaf or have difficulty hearing?: No Does the patient have difficulty seeing, even when wearing glasses/contacts?: No Does the patient have difficulty concentrating, remembering, or making decisions?: No Does the patient have difficulty walking or climbing stairs?: No Does the patient have difficulty dressing or bathing?: No Does the patient have difficulty doing errands alone such as visiting a doctor's office or shopping?: No   Past Medical History:  Past Medical History:  Diagnosis Date   (HFpEF) heart failure with preserved ejection fraction (HCC)    a.) TTE 06/09/2015: EF 50-55%, sev LVH, sev lat HK, mild LA dil, mild red RVSF, mild PR, G1DD; b.) TTE 07/04/2020: EF 50-55%, mild LVH, diffuse HK, apical lat AK, mild LA dil, triv MR, mod AoV calc, G1DD; c.) TTE 02/17/2022: EF 50-55%, LVH, LV/ mid apical antlat/apical HK, mild LA dil, triv MR, mod MAC, triv AR, AoV scler; e.) TTE 06/22/2022: EF >55%, LVH, mod MAC, triv AR, G1DD   Acute non-ST elevation myocardial infarction (NSTEMI) of inferior wall (HCC) 08/24/2003   a.) LHC/PCI 08/26/2003:  95% dRCA (2.5 x 20 mm Taxus DES), 75% dRCA (2.5 x 20 mm Taxus DES), 95% RPDA (2.5 x 16 mm Taxus DES)   Acute ST elevation myocardial infarction (STEMI) of lateral wall (HCC) 06/08/2015   a.) LHC/PCI 06/08/2015: overlapping 2.75 x 20 mm and 3.0 x 8 mm Promus Premier DES to OM1. PTCA of OM2.   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 PTCA of OM2; e.) staged PCI 07/16/15: 3.0x28 mm  Promus Prem mRCA   Chronic bilateral low back pain with right-sided sciatica    Chronic pain of both shoulders    Chronic pain of right knee    Complication of anesthesia    a.) states "we almost lost you" with colonoscopy 2022 --> desat with assoc bradycardia (? obstruction). Pt turned supine & placed on 100% FiO2 face mask. Propofol infusion stopped. Atropine 0.4 mg given. Crash cart brought in & pads placed (strong pulse throughout). HR & sats improved. Discussed with cards "okay to proceed" with MD in room & jaw thrust to relieve obstruction - remainder uneventful   COVID    Cystocele without uterine prolapse    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    "got booster shot a few times"   GERD (gastroesophageal reflux disease)    History of heart artery stent 08/26/2003   a.) TOTAL OF 9 cardiac stents as of 03/30/2023   Hyperlipidemia    Hypertension    Long term current use of antithrombotics/antiplatelets    a.) on DAPT (ASA + clopidogrel)   Morbid obesity (HCC)    Noncompliance with medication regimen    OSA (obstructive sleep apnea)    a.) no nocturnal PAP therapy; "never received machine" per patient   Paroxysmal atrial fibrillation (HCC)    a.) CHA2DS2VASc = 7 (age, sex, CHF, HTN, TIA x 2, vascular disease history);  b.) rate/rhythm maintained on oral diltiazem; no chronic anticoagulation   Personal history of colonic polyps    Pre-diabetes    PSVT (paroxysmal supraventricular tachycardia) (HCC) 08/31/2022   a.)  Zio 02/17/2022 12 runs PSVT with longest lasting > 16 minutes (max rate 142 bpm); b.) Zio 08/31/2022: 10 runs PSVT with longest  lasting 54 minutes (max rate 245 bpm)   PVC (premature ventricular contraction)    Second degree heart block 02/17/2022   a.) Zio 02/17/2022 - likely occurred while patient was sleeping   TIA (transient ischemic attack) 06/22/2022   Vitamin D deficiency     Surgical History:  Past Surgical History:  Procedure Laterality Date   ANTERIOR AND POSTERIOR REPAIR N/A 04/04/2023   Procedure: Merrilee Seashore AND POSTERIOR  REPAIR;  Surgeon: Hildred Laser, MD;  Location: [facility] ORS;  Service: Gynecology;  Laterality: N/A;   CARDIAC CATHETERIZATION N/A 06/08/2015   Procedure: Left Heart Cath and Coronary Angiography;  Surgeon: Lyn Records, MD;  Location: [facility] INVASIVE CV LAB;  Service: Cardiovascular;  Laterality: N/A;   CARDIAC CATHETERIZATION N/A 06/08/2015   Procedure: Coronary Stent Intervention;  Surgeon: Lyn Records, MD;  Location: [facility] INVASIVE CV LAB;  Service: Cardiovascular;  Laterality: N/A;   CARDIAC CATHETERIZATION N/A 07/16/2015   Procedure: Coronary Stent Intervention;  Surgeon: Lyn Records, MD;  Location: [facility] INVASIVE CV LAB;  Service: Cardiovascular;  Laterality: N/A;   COLONOSCOPY N/A 02/17/2021   Procedure: COLONOSCOPY;  Surgeon: Midge Minium, MD;  Location: [facility] ENDOSCOPY;  Service: Endoscopy;  Laterality: N/A;   CORONARY ANGIOPLASTY WITH STENT PLACEMENT Left 08/26/2003   Procedure: CORONARY ANGIOPLASTY WITH STENT PLACEMENT; Location: [facility]; Surgeon: Cynda Acres, MD   CORONARY ANGIOPLASTY WITH STENT PLACEMENT Left 09/23/2003   Procedure: CORONARY ANGIOPLASTY WITH STENT PLACEMENT; Location: [facility]; Surgeon: Gasper Sells, MD   KNEE ARTHROSCOPY Left 04/30/1989   LEFT HEART CATH AND CORONARY ANGIOGRAPHY Left 08/26/2003   Procedure: LEFT HEART CATH AND CORONARY ANGIOGRAPHY; Location: [facility]; Surgeon: Harold Hedge, MD   LEFT HEART CATH AND CORONARY ANGIOGRAPHY Left 08/26/2005   ProcedureL LEFT HEART CATH AND CORONARY ANGIOGRAPHY; Location: [facility]; Surgeon: Wynelle Link  Welton Flakes, MD   LEFT HEART CATH AND  CORONARY ANGIOGRAPHY Left 02/17/2009   Procedure: LEFT HEART CATH AND CORONARY ANGIOGRAPHY; Location: [facility]; Surgeon: Lorine Bears, MD   LEFT HEART CATH AND CORONARY ANGIOGRAPHY Left 09/23/2003   Procedure: LEFT HEART CATH AND CORONARY ANGIOGRAPHY; Location: [facility]; Surgeon: Adrian Blackwater, MD   LEFT HEART CATH AND CORONARY ANGIOGRAPHY Left 11/01/2008   Procedure: LEFT HEART CATH AND CORONARY ANGIOGRAPHY; Location: [facility]; Surgeon: Adrian Blackwater, MD   TUBAL LIGATION  ~ 1978   VAGINAL HYSTERECTOMY N/A 04/04/2023   Procedure: TOTAL VAGINAL  HYSTERECTOMY;  Surgeon: Hildred Laser, MD;  Location: [facility] ORS;  Service: Gynecology;  Laterality: N/A;    Medications:  Current Outpatient Medications on File Prior to Visit  Medication Sig   aspirin EC 81 MG tablet Take 81 mg by mouth daily.   cholecalciferol (VITAMIN D3) 25 MCG (1000 UNIT) tablet Take 1,000 Units by mouth daily.   Cyanocobalamin (VITAMIN B12 PO) Take 1 tablet by mouth as needed.   docusate sodium (COLACE) 100 MG capsule Take 1 capsule (100 mg total) by mouth daily as needed.   lidocaine (LIDODERM) 5 % Place 1 patch onto the skin daily. Remove & Discard patch within 12 hours or as directed by MD (Patient taking differently: Place 1 patch onto the skin as needed. Remove & Discard patch within 12 hours or as directed by MD)   MAGNESIUM PO Take 1 tablet by mouth as needed.   nitroGLYCERIN (NITROSTAT) 0.4 MG SL tablet DISSOLVE ONE TABLET UNDER THE TONGUE EVERY 5 MINUTES AS NEEDED FOR CHEST PAIN.  DO NOT EXCEED A TOTAL OF 3 DOSES IN 15 MINUTES   Omega-3 Fatty Acids (FISH OIL PO) Take 1 tablet by mouth as needed.   rosuvastatin (CRESTOR) 40 MG tablet Take 1 tablet by mouth once daily   senna-docusate (SENOKOT-S) 8.6-50 MG tablet Take 1 tablet by mouth 2 (two) times daily as needed for mild constipation.   UNABLE TO FIND Take 1 tablet by mouth daily as needed. Neo-Cell   No current facility-administered medications on file prior to visit.     Allergies:  Allergies  Allergen Reactions   Brilinta [Ticagrelor] Shortness Of Breath   Lipitor [Atorvastatin] Itching    Mouth itching, cough   Latex Other (See Comments)    Gloves make hands look black after prolonged use   Penicillins Itching and Swelling    Tongue itching and lip swelling Has patient had a PCN reaction causing immediate rash, facial/tongue/throat swelling, SOB or lightheadedness with hypotension: yes Has patient had a PCN reaction causing severe rash involving mucus membranes or skin necrosis: No Has patient had a PCN reaction that required hospitalization No Has patient had a PCN reaction occurring within the last 10 years: No If all of the above answers are "NO", then may proceed with Cephalosporin use.   Tekturna [Aliskiren] Rash    Social History:  Social History   Socioeconomic History   Marital status: Widowed    Spouse name: Not on file   Number of children: Not on file   Years of education: Not on file   Highest education level: 11th grade  Occupational History   Occupation: retired  Tobacco Use   Smoking status: Never   Smokeless tobacco: Never  Vaping Use   Vaping status: Never Used  Substance and Sexual Activity   Alcohol use: No   Drug use: No   Sexual activity: Not Currently    Birth control/protection: Surgical  Other Topics Concern  Not on file  Social History Narrative   Not on file   Social Determinants of Health   Financial Resource Strain: Low Risk  (07/19/2023)   Overall Financial Resource Strain (CARDIA)    Difficulty of Paying Living Expenses: Not very hard  Food Insecurity: No Food Insecurity (07/19/2023)   Hunger Vital Sign    Worried About Running Out of Food in the Last Year: Never true    Ran Out of Food in the Last Year: Never true  Transportation Needs: No Transportation Needs (07/19/2023)   PRAPARE - Administrator, Civil Service (Medical): No    Lack of Transportation (Non-Medical): No   Physical Activity: Inactive (07/19/2023)   Exercise Vital Sign    Days of Exercise per Week: 0 days    Minutes of Exercise per Session: 0 min  Stress: No Stress Concern Present (07/19/2023)   [employer] of Occupational Health - Occupational Stress Questionnaire    Feeling of Stress : Not at all  Social Connections: Moderately Isolated (07/19/2023)   Social Connection and Isolation Panel [NHANES]    Frequency of Communication with Friends and Family: More than three times a week    Frequency of Social Gatherings with Friends and Family: Three times a week    Attends Religious Services: More than 4 times per year    Active Member of Clubs or Organizations: No    Attends Banker Meetings: Never    Marital Status: Widowed  Intimate Partner Violence: Not At Risk (07/19/2023)   Humiliation, Afraid, Rape, and Kick questionnaire    Fear of Current or Ex-Partner: No    Emotionally Abused: No    Physically Abused: No    Sexually Abused: No   Social History   Tobacco Use  Smoking Status Never  Smokeless Tobacco Never   Social History   Substance and Sexual Activity  Alcohol Use No    Family History:  Family History  Problem Relation Age of Onset   Healthy Mother    Prostate cancer Father    Kidney disease Father    Healthy Brother    Cancer Neg Hx    Diabetes Neg Hx    Heart disease Neg Hx     Past medical history, surgical history, medications, allergies, family history and social history reviewed with patient today and changes made to appropriate areas of the chart.   ROS All other ROS negative except what is listed above and in the HPI.      Objective:    BP 130/75 (BP Location: Left Arm, Cuff Size: Large)   Pulse 64   Temp 98 F (36.7 C) (Oral)   Ht 5' 2.5" (1.588 m)   Wt 225 lb 9.6 oz (102.3 kg)   LMP  (LMP Unknown)   SpO2 98%   BMI 40.61 kg/m   Wt Readings from Last 3 Encounters:  07/19/23 225 lb 9.6 oz (102.3 kg)  05/17/23 226 lb 4.8  oz (102.6 kg)  04/19/23 221 lb (100.2 kg)    Physical Exam Vitals and nursing note reviewed. Exam conducted with a chaperone present.  Constitutional:      General: She is awake. She is not in acute distress.    Appearance: Normal appearance. She is well-developed and well-groomed. She is obese. She is not ill-appearing or toxic-appearing.  HENT:     Head: Normocephalic and atraumatic.     Right Ear: Hearing, tympanic membrane, ear canal and external ear normal. No drainage.  Left Ear: Hearing, tympanic membrane, ear canal and external ear normal. No drainage.     Nose: Nose normal.     Right Sinus: No maxillary sinus tenderness or frontal sinus tenderness.     Left Sinus: No maxillary sinus tenderness or frontal sinus tenderness.     Mouth/Throat:     Mouth: Mucous membranes are moist.     Pharynx: Oropharynx is clear. Uvula midline. No pharyngeal swelling, oropharyngeal exudate or posterior oropharyngeal erythema.  Eyes:     General: Lids are normal.        Right eye: No discharge.        Left eye: No discharge.     Extraocular Movements: Extraocular movements intact.     Conjunctiva/sclera: Conjunctivae normal.     Pupils: Pupils are equal, round, and reactive to light.     Visual Fields: Right eye visual fields normal and left eye visual fields normal.  Neck:     Thyroid: No thyromegaly.     Vascular: No carotid bruit.     Trachea: Trachea normal.  Cardiovascular:     Rate and Rhythm: Normal rate and regular rhythm.     Heart sounds: Normal heart sounds. No murmur heard.    No gallop.  Pulmonary:     Effort: Pulmonary effort is normal. No accessory muscle usage or respiratory distress.     Breath sounds: Normal breath sounds.  Chest:  Breasts:    Right: Normal.     Left: Normal.  Abdominal:     General: Bowel sounds are normal.     Palpations: Abdomen is soft. There is no hepatomegaly or splenomegaly.     Tenderness: There is no abdominal tenderness.   Musculoskeletal:        General: Normal range of motion.     Cervical back: Normal range of motion and neck supple.     Right lower leg: No edema.     Left lower leg: No edema.  Lymphadenopathy:     Head:     Right side of head: No submental, submandibular, tonsillar, preauricular or posterior auricular adenopathy.     Left side of head: No submental, submandibular, tonsillar, preauricular or posterior auricular adenopathy.     Cervical: No cervical adenopathy.     Upper Body:     Right upper body: No supraclavicular, axillary or pectoral adenopathy.     Left upper body: No supraclavicular, axillary or pectoral adenopathy.  Skin:    General: Skin is warm and dry.     Capillary Refill: Capillary refill takes less than 2 seconds.     Findings: No rash.  Neurological:     Mental Status: She is alert and oriented to person, place, and time.     Gait: Gait is intact.     Deep Tendon Reflexes: Reflexes are normal and symmetric.     Reflex Scores:      Brachioradialis reflexes are 2+ on the right side and 2+ on the left side.      Patellar reflexes are 2+ on the right side and 2+ on the left side. Psychiatric:        Attention and Perception: Attention normal.        Mood and Affect: Mood normal.        Speech: Speech normal.        Behavior: Behavior normal. Behavior is cooperative.        Thought Content: Thought content normal.        Judgment: Judgment  normal.    English is not first language    07/19/2023    1:28 PM 05/04/2022    8:37 AM 05/01/2021    8:48 AM 04/28/2020    3:27 PM 04/14/2018    1:13 PM  6CIT Screen  What Year? 0 points 0 points 0 points 0 points 0 points  What month? 0 points 0 points 0 points 0 points 0 points  What time? 0 points 0 points 0 points 0 points 0 points  Count back from 20 2 points 0 points 0 points 2 points 0 points  Months in reverse 2 points 2 points 0 points 0 points 0 points  Repeat phrase 4 points 0 points 8 points 8 points 0 points   Total Score 8 points 2 points 8 points 10 points 0 points   Results for orders placed or performed in visit on 04/19/23  Cervicovaginal ancillary only  Result Value Ref Range   Trichomonas Negative    Bacterial Vaginitis (gardnerella) Positive (A)    Candida Vaginitis Negative    Candida Glabrata Negative    Comment      Normal Reference Range Bacterial Vaginosis - Negative   Comment Normal Reference Range Candida Species - Negative    Comment Normal Reference Range Candida Galbrata - Negative    Comment Normal Reference Range Trichomonas - Negative       Assessment & Plan:   Problem List Items Addressed This Visit       Cardiovascular and Mediastinum   (HFpEF) heart failure with preserved ejection fraction (HCC) - Primary (Chronic)    Chronic, ongoing.  Euvolemic on exam.  Continue current medication regimen and collaboration with cardiology.  Discussed diet choices and to cut back on sodium intake, she culturally eats a higher sodium diet with lots of spicier foods.   - Reminded to call for an overnight weight gain of >2 pounds or a weekly weight weight of >5 pounds - not adding salt to his food and has been reading food labels. 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  Continue current medication regimen and collaboration with cardiology.  No recent NTG use, will send in refill as needed.      Relevant  Medications   amLODipine (NORVASC) 10 MG tablet   clopidogrel (PLAVIX) 75 MG tablet   diltiazem (CARDIZEM) 30 MG tablet   hydrochlorothiazide (HYDRODIURIL) 25 MG tablet   isosorbide mononitrate (IMDUR) 60 MG 24 hr tablet   losartan (COZAAR) 100 MG tablet   spironolactone (ALDACTONE) 25 MG tablet   Other Relevant Orders   Comprehensive metabolic panel   Lipid Panel w/o Chol/HDL Ratio   Essential hypertension (Chronic)    Chronic, ongoing.  BP at goal in office today.  Is working on cutting back on sodium. Is taking medications more consistently.  Recommend ensuring medication taken daily + checking BP at home at least a  few mornings a week.  Continue collaboration with cardiology and review notes.  Continue current medication regimen at this time, suspect adjustments will need to be made in future if any elevations in BP.  Labs: CMP, CBC, TSH. Continue to collaborate with cardiology.      Relevant Medications   amLODipine (NORVASC) 10 MG tablet   diltiazem (CARDIZEM) 30 MG tablet   hydrochlorothiazide (HYDRODIURIL) 25 MG tablet   isosorbide mononitrate (IMDUR) 60 MG 24 hr tablet   losartan (COZAAR) 100 MG tablet   spironolactone (ALDACTONE) 25 MG tablet   Other Relevant Orders   TSH   SVT (supraventricular tachycardia) (HCC) (Chronic)    Stable.  Followed by cardiology, continue this collaboration.  Tried taking Metoprolol, reports she never could tolerate.      Relevant Medications   amLODipine (NORVASC) 10 MG tablet   diltiazem (CARDIZEM) 30 MG tablet   hydrochlorothiazide (HYDRODIURIL) 25 MG tablet   isosorbide mononitrate (IMDUR) 60 MG 24 hr tablet   losartan (COZAAR) 100 MG tablet   spironolactone (ALDACTONE) 25 MG tablet   Other Relevant Orders   Comprehensive metabolic panel   Lipid Panel w/o Chol/HDL Ratio   Aortic atherosclerosis (HCC)    Chronic, ongoing.  Noted on CXR 04/16/21. Check lipid panel today.  Continue preventative statin and ASA.      Relevant Medications    amLODipine (NORVASC) 10 MG tablet   diltiazem (CARDIZEM) 30 MG tablet   hydrochlorothiazide (HYDRODIURIL) 25 MG tablet   isosorbide mononitrate (IMDUR) 60 MG 24 hr tablet   losartan (COZAAR) 100 MG tablet   spironolactone (ALDACTONE) 25 MG tablet   Other Relevant Orders   Comprehensive metabolic panel   Lipid Panel w/o Chol/HDL Ratio   Ascending aorta dilation (HCC)    Ongoing, continue collaboration with cardiology. Recent noted reviewed.      Relevant Medications   amLODipine (NORVASC) 10 MG tablet   diltiazem (CARDIZEM) 30 MG tablet   hydrochlorothiazide (HYDRODIURIL) 25 MG tablet   isosorbide mononitrate (IMDUR) 60 MG 24 hr tablet   losartan (COZAAR) 100 MG tablet   spironolactone (ALDACTONE) 25 MG tablet   Other Relevant Orders   Comprehensive metabolic panel   Lipid Panel w/o Chol/HDL Ratio     Digestive   GERD without esophagitis    Ongoing, stable.  Discussed diet choices and to cut back on spicy food intake, she culturally eats a higher sodium diet with lots of spicier foods.  Would prefer continued focus on turmeric and lemon.  Will continue this, which is offering benefit and if worsening consider GI referral.      Relevant Orders   Magnesium     Other   Hyperlipidemia (Chronic)    Chronic, ongoing.  Continue current medication regimen and adjust as needed.  Lipid panel today.  Have recommended she take this daily to help with prevention.  Discussed this at length with patient.  Consider addition of Zetia if elevations above goal, although she refuses this today.      Relevant Medications   amLODipine (NORVASC) 10 MG tablet   diltiazem (CARDIZEM) 30 MG tablet   hydrochlorothiazide (HYDRODIURIL) 25 MG tablet   isosorbide mononitrate (IMDUR) 60 MG 24 hr tablet   losartan (COZAAR) 100 MG tablet   spironolactone (ALDACTONE) 25 MG tablet   Other Relevant Orders   Comprehensive metabolic panel   Lipid Panel w/o Chol/HDL Ratio   Elevated hemoglobin A1c    A1c  6.3% -- recheck today, continue Losartan for kidney protection  and recommend patient focus heavily on diet changes and regular activity.      Relevant Orders   HgB A1c   Morbid obesity (HCC)    BMI 40.61 with HTN, OLD MI, HLD. Recommended eating smaller high protein, low fat meals more frequently and exercising 30 mins a day 5 times a week with a goal of 10-15lb weight loss in the next 3 months. Patient voiced their understanding and motivation to adhere to these recommendations.       Vitamin D deficiency    Ongoing, recommend continue daily supplement and adjust as needed.  Recheck level today.  DEXA was normal range in October 2023, repeat in 10 years, sooner if fracture presents.      Relevant Orders   VITAMIN D 25 Hydroxy (Vit-D Deficiency, Fractures)   Other Visit Diagnoses     Medicare annual wellness visit, subsequent       Medicare wellness due and performed today with patient.   Encounter for annual physical exam       Annual physical today with labs and health maintenance reviewed, discussed with patient.        Follow up plan: Return in about 6 months (around 01/16/2024) for HTN/HLD, HF, IFG, GERD.   LABORATORY TESTING:  - Pap smear: not applicable hysterectomy 04/04/23  IMMUNIZATIONS:   - Tdap: Tetanus vaccination status reviewed: last tetanus booster within 10 years. - Influenza: Refused - Pneumovax: Up to date - Prevnar: Up to date - COVID: Refused -- had none, refuses - HPV: Not applicable - Shingrix vaccine: Refused  SCREENING: -Mammogram: Ordered today  - Colonoscopy: Up to date  - Bone Density: Up to date  -Hearing Test: Not applicable  -Spirometry: Not applicable   PATIENT COUNSELING:   Advised to take 1 mg of folate supplement per day if capable of pregnancy.   Sexuality: Discussed sexually transmitted diseases, partner selection, use of condoms, avoidance of unintended pregnancy  and contraceptive alternatives.   Advised to avoid cigarette  smoking.  I discussed with the patient that most people either abstain from alcohol or drink within safe limits (<=14/week and <=4 drinks/occasion for males, <=7/weeks and <= 3 drinks/occasion for females) and that the risk for alcohol disorders and other health effects rises proportionally with the number of drinks per week and how often a drinker exceeds daily limits.  Discussed cessation/primary prevention of drug use and availability of treatment for abuse.   Diet: Encouraged to adjust caloric intake to maintain  or achieve ideal body weight, to reduce intake of dietary saturated fat and total fat, to limit sodium intake by avoiding high sodium foods and not adding table salt, and to maintain adequate dietary potassium and calcium preferably from fresh fruits, vegetables, and low-fat dairy products.    Stressed the importance of regular exercise  Injury prevention: Discussed safety belts, safety helmets, smoke detector, smoking near bedding or upholstery.   Dental health: Discussed importance of regular tooth brushing, flossing, and dental visits.    NEXT PREVENTATIVE PHYSICAL DUE IN 1 YEAR. Return in about 6 months (around 01/16/2024) for HTN/HLD, HF, IFG, GERD.

## 2023-07-19 NOTE — Assessment & Plan Note (Signed)
Chronic, ongoing.  Noted on CXR 04/16/21. Check lipid panel today.  Continue preventative statin and ASA.

## 2023-07-19 NOTE — Telephone Encounter (Signed)
Duplicate request- ordered 07/18/23 by provider Requested Prescriptions  Pending Prescriptions Disp Refills   losartan (COZAAR) 100 MG tablet [Pharmacy Med Name: Losartan Potassium 100 MG Oral Tablet] 90 tablet 0    Sig: Take 1 tablet by mouth once daily     Cardiovascular:  Angiotensin Receptor Blockers Failed - 07/18/2023  4:44 PM      Failed - K in normal range and within 180 days    Potassium  Date Value Ref Range Status  04/04/2023 3.2 (L) 3.5 - 5.1 mmol/L Final         Passed - Cr in normal range and within 180 days    Creat  Date Value Ref Range Status  07/23/2015 0.70 0.50 - 0.99 mg/dL Final   Creatinine, Ser  Date Value Ref Range Status  04/04/2023 0.90 0.44 - 1.00 mg/dL Final         Passed - Patient is not pregnant      Passed - Last BP in normal range    BP Readings from Last 1 Encounters:  07/19/23 130/75         Passed - Valid encounter within last 6 months    Recent Outpatient Visits           Today Chronic heart failure with preserved ejection fraction (HCC)   [place] [facility] [place], Corrie Dandy T, NP   5 months ago GERD without esophagitis   [place] [facility] [place], [place] T, NP   6 months ago Chronic pain of both shoulders   [place] [facility], [place] T, NP   7 months ago Chronic heart failure with preserved ejection fraction (HCC)   [place] [facility] [place], Jolene T, NP   10 months ago Chronic heart failure with preserved ejection fraction (HCC)   [place] [facility] [place], Dorie Rank, NP       Future Appointments             In 2 weeks [place], Chelsea Aus, MD [facility] [place]   In 6 months [place], Dorie Rank, NP [place] [facility], [facility]

## 2023-07-19 NOTE — Assessment & Plan Note (Signed)
Chronic, ongoing.  Continue current medication regimen and collaboration with cardiology.  No recent NTG use, will send in refill as needed. 

## 2023-07-19 NOTE — Assessment & Plan Note (Signed)
Chronic, ongoing.  BP at goal in office today.  Is working on cutting back on sodium. Is taking medications more consistently.  Recommend ensuring medication taken daily + checking BP at home at least a few mornings a week.  Continue collaboration with cardiology and review notes.  Continue current medication regimen at this time, suspect adjustments will need to be made in future if any elevations in BP.  Labs: CMP, CBC, TSH. Continue to collaborate with cardiology.

## 2023-07-20 ENCOUNTER — Encounter: Payer: Self-pay | Admitting: Obstetrics and Gynecology

## 2023-07-20 ENCOUNTER — Ambulatory Visit (INDEPENDENT_AMBULATORY_CARE_PROVIDER_SITE_OTHER): Payer: 59 | Admitting: Obstetrics and Gynecology

## 2023-07-20 VITALS — BP 158/82 | HR 64 | Resp 16 | Ht 62.5 in | Wt 227.9 lb

## 2023-07-20 DIAGNOSIS — R399 Unspecified symptoms and signs involving the genitourinary system: Secondary | ICD-10-CM

## 2023-07-20 DIAGNOSIS — R102 Pelvic and perineal pain: Secondary | ICD-10-CM

## 2023-07-20 LAB — COMPREHENSIVE METABOLIC PANEL
ALT: 14 [IU]/L (ref 0–32)
AST: 23 [IU]/L (ref 0–40)
Albumin: 4.1 g/dL (ref 3.8–4.8)
Alkaline Phosphatase: 67 [IU]/L (ref 44–121)
BUN/Creatinine Ratio: 17 (ref 12–28)
BUN: 12 mg/dL (ref 8–27)
Bilirubin Total: 0.6 mg/dL (ref 0.0–1.2)
CO2: 22 mmol/L (ref 20–29)
Calcium: 9.9 mg/dL (ref 8.7–10.3)
Chloride: 99 mmol/L (ref 96–106)
Creatinine, Ser: 0.72 mg/dL (ref 0.57–1.00)
Globulin, Total: 3.4 g/dL (ref 1.5–4.5)
Glucose: 82 mg/dL (ref 70–99)
Potassium: 3.4 mmol/L — ABNORMAL LOW (ref 3.5–5.2)
Sodium: 137 mmol/L (ref 134–144)
Total Protein: 7.5 g/dL (ref 6.0–8.5)
eGFR: 88 mL/min/{1.73_m2} (ref >59)

## 2023-07-20 LAB — TSH: TSH: 2.45 u[IU]/mL (ref 0.450–4.500)

## 2023-07-20 LAB — CBC WITH DIFFERENTIAL/PLATELET
Basophils Absolute: 0.1 10*3/uL (ref 0.0–0.2)
Basos: 1 %
EOS (ABSOLUTE): 0.1 10*3/uL (ref 0.0–0.4)
Eos: 2 %
Hematocrit: 43.4 % (ref 34.0–46.6)
Hemoglobin: 13.5 g/dL (ref 11.1–15.9)
Immature Grans (Abs): 0 10*3/uL (ref 0.0–0.1)
Immature Granulocytes: 0 %
Lymphocytes Absolute: 2.5 10*3/uL (ref 0.7–3.1)
Lymphs: 38 %
MCH: 25.7 pg — ABNORMAL LOW (ref 26.6–33.0)
MCHC: 31.1 g/dL — ABNORMAL LOW (ref 31.5–35.7)
MCV: 83 fL (ref 79–97)
Monocytes Absolute: 0.4 10*3/uL (ref 0.1–0.9)
Monocytes: 6 %
Neutrophils Absolute: 3.4 10*3/uL (ref 1.4–7.0)
Neutrophils: 53 %
Platelets: 228 10*3/uL (ref 150–450)
RBC: 5.25 x10E6/uL (ref 3.77–5.28)
RDW: 15.3 % (ref 11.7–15.4)
WBC: 6.5 10*3/uL (ref 3.4–10.8)

## 2023-07-20 LAB — LIPID PANEL W/O CHOL/HDL RATIO
Cholesterol, Total: 147 mg/dL (ref 100–199)
HDL: 39 mg/dL — ABNORMAL LOW (ref >39)
LDL Chol Calc (NIH): 93 mg/dL (ref 0–99)
Triglycerides: 79 mg/dL (ref 0–149)
VLDL Cholesterol Cal: 15 mg/dL (ref 5–40)

## 2023-07-20 LAB — MAGNESIUM: Magnesium: 2 mg/dL (ref 1.6–2.3)

## 2023-07-20 LAB — POCT URINALYSIS DIPSTICK
Bilirubin, UA: NEGATIVE
Glucose, UA: NEGATIVE
Ketones, UA: NEGATIVE
Leukocytes, UA: NEGATIVE
Nitrite, UA: NEGATIVE
Protein, UA: POSITIVE — AB
Spec Grav, UA: 1.01 (ref 1.010–1.025)
Urobilinogen, UA: 0.2 U/dL
pH, UA: 6.5 (ref 5.0–8.0)

## 2023-07-20 LAB — VITAMIN D 25 HYDROXY (VIT D DEFICIENCY, FRACTURES): Vit D, 25-Hydroxy: 25.2 ng/mL — ABNORMAL LOW (ref 30.0–100.0)

## 2023-07-20 LAB — HEMOGLOBIN A1C
Est. average glucose Bld gHb Est-mCnc: 128 mg/dL
Hgb A1c MFr Bld: 6.1 % — ABNORMAL HIGH (ref 4.8–5.6)

## 2023-07-20 MED ORDER — POTASSIUM CHLORIDE CRYS ER 10 MEQ PO TBCR: 10.0000 meq | EXTENDED_RELEASE_TABLET | Freq: Every day | ORAL | 0 refills | Status: DC

## 2023-07-20 NOTE — Addendum Note (Signed)
Addended by: Aura Dials T on: 07/20/2023 05:52 PM   Modules accepted: Orders

## 2023-07-20 NOTE — Progress Notes (Signed)
Good day, please let Deavion know her labs have returned: - CBC shows no anemia or infection - Kidney and liver function are normal.  Potassium is a little low again.  I would like you to take the potassium supplement I send in for 5 days and then return next week for lab only visit to recheck.  (Staff please schedule this) - LDL, bad cholesterol, continues to be a bit above goal.  Please ensure you are taking your Rosuvastatin daily.  I do agree with cardiology that you would benefit from adding Zetia onto regimen to help lower this more.  Would you like to try this? - Vitamin D a little low, please ensure you are taking Vitamin D3 2000 units daily. - A1c remains in prediabetic range, please continue to focus on diet changes and regular exercise to prevent this from entering diabetes levels. - Remainder of labs stable.  Any questions? Keep being amazing!!  Thank you for allowing me to participate in your care.  I appreciate you. Kindest regards, Jash Wahlen

## 2023-07-20 NOTE — Progress Notes (Signed)
    GYNECOLOGY PROGRESS NOTE  Subjective:    Patient ID: Jennifer Grant, female    DOB: 1950-10-29, 72 y.o.   MRN: [id number]  HPI  Patient is a 72 y.o. [id number] female who presents for evaluation of vaginal pain and pelvic pain for the past several weeks. She is approximately 3 months s/p vaginal hysterectomy with A/P repair.  She has been using coconut oil occasionally for the discomfort she feels in her vagina. She is also noting an odor with urination lately. Reports occasional discomfort with voiding but no significant symptoms. Denies hematuria.   The following portions of the patient's history were reviewed and updated as appropriate: allergies, current medications, past family history, past medical history, past social history, past surgical history, and problem list.  Review of Systems Pertinent items are noted in HPI.   Objective:   Blood pressure (!) 140/82, pulse 67, resp. rate 16, height 5' 2.5" (1.588 m), weight 227 lb 14.4 oz (103.4 kg). Body mass index is 41.02 kg/m. General appearance: alert, cooperative, and no distress Abdomen: soft, non-tender; bowel sounds normal; no masses,  no organomegaly Pelvic: external genitalia normal, rectovaginal septum normal.  Vagina with minimal thin white discharge. Uterus and cervix surgically absent. Adnexae non-palpable, nontender bilaterally.  Extremities: extremities normal, atraumatic, no cyanosis or edema Neurologic: Grossly normal    Labs:  Results for orders placed or performed in visit on 07/20/23  POCT Urinalysis Dipstick  Result Value Ref Range   Color, UA     Clarity, UA     Glucose, UA Negative Negative   Bilirubin, UA Negative    Ketones, UA Negative    Spec Grav, UA 1.010 1.010 - 1.025   Blood, UA Trace    pH, UA 6.5 5.0 - 8.0   Protein, UA Positive (A) Negative   Urobilinogen, UA 0.2 0.2 or 1.0 E.U./dL   Nitrite, UA Negative    Leukocytes, UA Negative Negative   Appearance     Odor       Assessment:    1. Pelvic pain   2. UTI symptoms      Plan:   - UA performed today, negative.  - Unclear cause of pelvic pain, however patient was having issues even prior to her surgery with pain, was thought to be due to her prolapse.      Hildred Laser, MD [facility]

## 2023-07-20 NOTE — Telephone Encounter (Signed)
Duplicate request  Requested Prescriptions  Pending Prescriptions Disp Refills   hydrochlorothiazide (HYDRODIURIL) 25 MG tablet [Pharmacy Med Name: hydroCHLOROthiazide 25 MG Oral Tablet] 30 tablet 0    Sig: Take 1 tablet by mouth once daily     Cardiovascular: Diuretics - Thiazide Failed - 07/19/2023 12:58 PM      Failed - K in normal range and within 180 days    Potassium  Date Value Ref Range Status  07/19/2023 3.4 (L) 3.5 - 5.2 mmol/L Final         Passed - Cr in normal range and within 180 days    Creat  Date Value Ref Range Status  07/23/2015 0.70 0.50 - 0.99 mg/dL Final   Creatinine, Ser  Date Value Ref Range Status  07/19/2023 0.72 0.57 - 1.00 mg/dL Final         Passed - Na in normal range and within 180 days    Sodium  Date Value Ref Range Status  07/19/2023 137 134 - 144 mmol/L Final         Passed - Last BP in normal range    BP Readings from Last 1 Encounters:  07/20/23 (!) 158/82         Passed - Valid encounter within last 6 months    Recent Outpatient Visits           Yesterday Chronic heart failure with preserved ejection fraction (HCC)   [place] [facility] [place], Corrie Dandy T, NP   6 months ago GERD without esophagitis   [place] [facility] [place], [place] T, NP   6 months ago Chronic pain of both shoulders   [place] [facility] [place], [place] T, NP   7 months ago Chronic heart failure with preserved ejection fraction (HCC)   [place] [facility] [place], [place] T, NP   10 months ago Chronic heart failure with preserved ejection fraction (HCC)   [place] [facility], Dorie Rank, NP       Future Appointments             In 1 week [place], Chelsea Aus, MD [facility] [place]   In 6 months [place], Dorie Rank, NP [place] [facility], [facility]

## 2023-07-22 MED ORDER — EZETIMIBE 10 MG PO TABS: 10.0000 mg | ORAL_TABLET | Freq: Every day | ORAL | 3 refills | Status: DC

## 2023-07-22 NOTE — Addendum Note (Signed)
Addended by: Aura Dials T on: 07/22/2023 09:24 AM   Modules accepted: Orders

## 2023-07-25 ENCOUNTER — Telehealth: Payer: Self-pay | Admitting: Nurse Practitioner

## 2023-07-25 NOTE — Telephone Encounter (Signed)
Tried calling patient's granddaughter, no answer and no VM. Will try to call again later.   OK for [facility] to give Jolene's message if the granddaughter calls back.

## 2023-07-25 NOTE — Telephone Encounter (Unsigned)
Copied from CRM [id number]. Topic: General - Inquiry >> Jul 22, 2023  4:05 PM Patsy Lager T wrote: Reason for CRM: Gabriel Rung patients granddaughter is requesting a diet sheet be emailed to her for her the patient. The email addr: [email or web address]

## 2023-07-25 NOTE — Telephone Encounter (Signed)
Do you have information we can send to the patient?

## 2023-07-27 ENCOUNTER — Other Ambulatory Visit: Payer: 59

## 2023-07-27 DIAGNOSIS — I25118 Atherosclerotic heart disease of native coronary artery with other forms of angina pectoris: Secondary | ICD-10-CM

## 2023-07-28 LAB — BASIC METABOLIC PANEL
BUN/Creatinine Ratio: 12 (ref 12–28)
BUN: 10 mg/dL (ref 8–27)
CO2: 24 mmol/L (ref 20–29)
Calcium: 10.6 mg/dL — ABNORMAL HIGH (ref 8.7–10.3)
Chloride: 97 mmol/L (ref 96–106)
Creatinine, Ser: 0.84 mg/dL (ref 0.57–1.00)
Glucose: 120 mg/dL — ABNORMAL HIGH (ref 70–99)
Potassium: 4.3 mmol/L (ref 3.5–5.2)
Sodium: 135 mmol/L (ref 134–144)
eGFR: 73 mL/min/{1.73_m2} (ref >59)

## 2023-07-29 ENCOUNTER — Other Ambulatory Visit: Payer: 59

## 2023-07-30 NOTE — Progress Notes (Signed)
Good morning, please let Jennifer Grant know her potassium level has improved this check.  We will continue to monitor closely at visits.  Calcium is mildly high, try to cut back on calcium rich foods or TUMS if taken.  Any questions? Keep being stellar!!  Thank you for allowing me to participate in your care.  I appreciate you. Kindest regards, Sonda Coppens

## 2023-08-02 ENCOUNTER — Ambulatory Visit: Payer: 59 | Attending: Cardiovascular Disease | Admitting: Cardiovascular Disease

## 2023-08-10 ENCOUNTER — Ambulatory Visit
Admission: RE | Admit: 2023-08-10 | Discharge: 2023-08-10 | Disposition: A | Discharge status: Home / Self Care | Payer: 59 | Source: Ambulatory Visit | Attending: Nurse Practitioner | Admitting: Nurse Practitioner

## 2023-08-10 DIAGNOSIS — Z1231 Encounter for screening mammogram for malignant neoplasm of breast: Secondary | ICD-10-CM | POA: Insufficient documentation

## 2023-08-16 ENCOUNTER — Encounter: Payer: Self-pay | Admitting: Cardiovascular Disease

## 2023-08-16 ENCOUNTER — Ambulatory Visit: Payer: 59 | Attending: Cardiovascular Disease | Admitting: Cardiovascular Disease

## 2023-08-16 VITALS — BP 132/96 | HR 73 | Ht 62.0 in | Wt 227.6 lb

## 2023-08-16 DIAGNOSIS — I10 Essential (primary) hypertension: Secondary | ICD-10-CM | POA: Diagnosis not present

## 2023-08-16 DIAGNOSIS — R002 Palpitations: Secondary | ICD-10-CM

## 2023-08-16 DIAGNOSIS — I5032 Chronic diastolic (congestive) heart failure: Secondary | ICD-10-CM | POA: Diagnosis not present

## 2023-08-16 DIAGNOSIS — I2510 Atherosclerotic heart disease of native coronary artery without angina pectoris: Secondary | ICD-10-CM | POA: Diagnosis not present

## 2023-08-16 DIAGNOSIS — I77810 Thoracic aortic ectasia: Secondary | ICD-10-CM | POA: Diagnosis not present

## 2023-08-16 DIAGNOSIS — E785 Hyperlipidemia, unspecified: Secondary | ICD-10-CM

## 2023-08-16 NOTE — Patient Instructions (Signed)

## 2023-08-16 NOTE — Progress Notes (Signed)
Cardiology Office Note   Date:  08/16/2023   ID:  Jennifer Grant, DOB 09/26/1950, MRN [id number]  PCP:  Marjie Skiff, NP  Cardiologist:   Lorine Bears, MD   Chief Complaint  Patient presents with   Follow-up    Patient reports intermittent left side chest pain with last episode 1 week ago      History of Present Illness: Jennifer Grant is a 72 y.o. female who presents to establish cardiovascular care.  He is to be followed by Dr. Katrinka Blazing and has the following cardiac profile:  Coronary artery disease  MI in 2004 s/p stent to LCx, OM1, OM2, RCA, RPDA Lat STEMI 05/2015 s/p POBA to OM2; 2.75 x 20 mm and 3 x 8 mm DES to OM1 LHC 06/08/2015: LAD mid and distal 50 and 75 / LCx mid stent patent /OM2 99 ISR, OM1 100 ISR /distal RCA and PDA 70-90 ISR Staged PCI 07/16/15: s/p 3 x 28 mm DES to mid distal RCA Myoview 02/17/2022 apical, apical lateral and anterolateral scar, no ischemia, EF 47, intermediate risk (HFpEF) heart failure with preserved ejection fraction  TTE 06/22/22: EF > 55, mild LVH, G1 DD, normal RVSF, moderate MAC, trivial AI, mild dilation of ascending aorta (42 mm), RAP 3 Paroxysmal atrial fibrillation  Occurred in setting of MI >> no anticoag unless recurrence  Supraventricular Tachycardia  ZIO monitor 01/27/2022: NSR, 12 episodes of SVT (longest greater than 16 minutes); Mobitz 1  She has been doing well overall with no chest pain, shortness of breath or palpitations.  Zetia was added recently and she has been tolerating the medication.  Past Medical History:  Diagnosis Date   (HFpEF) heart failure with preserved ejection fraction (HCC)    a.) TTE 06/09/2015: EF 50-55%, sev LVH, sev lat HK, mild LA dil, mild red RVSF, mild PR, G1DD; b.) TTE 07/04/2020: EF 50-55%, mild LVH, diffuse HK, apical lat AK, mild LA dil, triv MR, mod AoV calc, G1DD; c.) TTE 02/17/2022: EF 50-55%, LVH, LV/ mid apical antlat/apical HK, mild LA dil, triv MR, mod MAC, triv AR, AoV scler; e.)  TTE 06/22/2022: EF >55%, LVH, mod MAC, triv AR, G1DD   Acute non-ST elevation myocardial infarction (NSTEMI) of inferior wall (HCC) 08/24/2003   a.) LHC/PCI 08/26/2003:  95% dRCA (2.5 x 20 mm Taxus DES), 75% dRCA (2.5 x 20 mm Taxus DES), 95% RPDA (2.5 x 16 mm Taxus DES)   Acute ST elevation myocardial infarction (STEMI) of lateral wall (HCC) 06/08/2015   a.) LHC/PCI 06/08/2015: overlapping 2.75 x 20 mm and 3.0 x 8 mm Promus Premier DES to OM1. PTCA of OM2.   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 PTCA of OM2; e.) staged PCI 07/16/15: 3.0x28 mm Promus Prem mRCA   Chronic bilateral low back pain with right-sided sciatica  Chronic pain of both shoulders    Chronic pain of right knee    Complication of anesthesia    a.) states "we almost lost you" with colonoscopy 2022 --> desat with assoc bradycardia (? obstruction). Pt turned supine & placed on 100% FiO2 face mask. Propofol infusion stopped. Atropine 0.4 mg given. Crash cart brought in & pads placed (strong pulse throughout). HR & sats improved. Discussed with cards "okay to proceed" with MD in room & jaw thrust to relieve obstruction - remainder uneventful   COVID    Cystocele without uterine prolapse    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    "got booster shot a few times"   GERD (gastroesophageal reflux disease)    History of heart artery stent 08/26/2003   a.) TOTAL OF 9 cardiac stents as of 03/30/2023    Hyperlipidemia    Hypertension    Long term current use of antithrombotics/antiplatelets    a.) on DAPT (ASA + clopidogrel)   Morbid obesity (HCC)    Noncompliance with medication regimen    OSA (obstructive sleep apnea)    a.) no nocturnal PAP therapy; "never received machine" per patient   Paroxysmal atrial fibrillation (HCC)    a.) CHA2DS2VASc = 7 (age, sex, CHF, HTN, TIA x 2, vascular disease history);  b.) rate/rhythm maintained on oral diltiazem; no chronic anticoagulation   Personal history of colonic polyps    Pre-diabetes    PSVT (paroxysmal supraventricular tachycardia) (HCC) 08/31/2022   a.)  Zio 02/17/2022 12 runs PSVT with longest lasting > 16 minutes (max rate 142 bpm); b.) Zio 08/31/2022: 10 runs PSVT with longest lasting 54 minutes (max rate 245 bpm)   PVC (premature ventricular contraction)    Second degree heart block 02/17/2022   a.) Zio 02/17/2022 - likely occurred while patient was sleeping   TIA (transient ischemic attack) 06/22/2022   Vitamin D deficiency     Past Surgical History:  Procedure Laterality Date   ANTERIOR AND POSTERIOR REPAIR N/A 04/04/2023   Procedure: Merrilee Seashore AND POSTERIOR  REPAIR;  Surgeon: Hildred Laser, MD;  Location: [facility] ORS;  Service: Gynecology;  Laterality: N/A;   CARDIAC CATHETERIZATION N/A 06/08/2015   Procedure: Left Heart Cath and Coronary Angiography;  Surgeon: Lyn Records, MD;  Location: [facility] INVASIVE CV LAB;  Service: Cardiovascular;  Laterality: N/A;   CARDIAC CATHETERIZATION N/A 06/08/2015   Procedure: Coronary Stent Intervention;  Surgeon: Lyn Records, MD;  Location: [facility] INVASIVE CV LAB;  Service: Cardiovascular;  Laterality: N/A;   CARDIAC CATHETERIZATION N/A 07/16/2015   Procedure: Coronary Stent Intervention;  Surgeon: Lyn Records, MD;  Location: [facility] INVASIVE CV LAB;  Service: Cardiovascular;  Laterality: N/A;   COLONOSCOPY N/A 02/17/2021   Procedure: COLONOSCOPY;  Surgeon: Midge Minium, MD;  Location: [facility] ENDOSCOPY;   Service: Endoscopy;  Laterality: N/A;   CORONARY ANGIOPLASTY WITH STENT PLACEMENT Left 08/26/2003   Procedure: CORONARY ANGIOPLASTY WITH STENT PLACEMENT; Location: [facility]; Surgeon: Cynda Acres, MD   CORONARY ANGIOPLASTY WITH STENT PLACEMENT Left 09/23/2003   Procedure: CORONARY ANGIOPLASTY WITH STENT PLACEMENT; Location: [facility]; Surgeon: Gasper Sells, MD   KNEE ARTHROSCOPY Left 04/30/1989   LEFT HEART CATH AND CORONARY ANGIOGRAPHY Left 08/26/2003   Procedure: LEFT HEART CATH AND CORONARY ANGIOGRAPHY; Location: [facility]; Surgeon: Harold Hedge, MD   LEFT HEART CATH AND CORONARY ANGIOGRAPHY Left 08/26/2005   ProcedureL LEFT HEART CATH AND CORONARY ANGIOGRAPHY; Location: [facility]; Surgeon: Adrian Blackwater, MD   LEFT HEART CATH AND CORONARY ANGIOGRAPHY Left 02/17/2009   Procedure: LEFT HEART CATH  AND CORONARY ANGIOGRAPHY; Location: [facility]; Surgeon: Lorine Bears, MD   LEFT HEART CATH AND CORONARY ANGIOGRAPHY Left 09/23/2003   Procedure: LEFT HEART CATH AND CORONARY ANGIOGRAPHY; Location: [facility]; Surgeon: Adrian Blackwater, MD   LEFT HEART CATH AND CORONARY ANGIOGRAPHY Left 11/01/2008   Procedure: LEFT HEART CATH AND CORONARY ANGIOGRAPHY; Location: [facility]; Surgeon: Adrian Blackwater, MD   TUBAL LIGATION  ~ 1978   VAGINAL HYSTERECTOMY N/A 04/04/2023   Procedure: TOTAL VAGINAL  HYSTERECTOMY;  Surgeon: Hildred Laser, MD;  Location: [facility] ORS;  Service: Gynecology;  Laterality: N/A;     Current Outpatient Medications  Medication Sig Dispense Refill   amLODipine (NORVASC) 10 MG tablet Take 1 tablet (10 mg total) by mouth daily. 90 tablet 4   aspirin EC 81 MG tablet Take 81 mg by mouth daily.     cholecalciferol (VITAMIN D3) 25 MCG (1000 UNIT) tablet Take 1,000 Units by mouth daily.     clopidogrel (PLAVIX) 75 MG tablet Take 1 tablet (75 mg total) by mouth daily. 90 tablet 4   diltiazem (CARDIZEM) 30 MG tablet Take 1 tablet (30 mg total) by mouth as needed. 30 tablet 3   docusate sodium (COLACE) 100 MG capsule Take 1 capsule  (100 mg total) by mouth daily as needed. 30 capsule 0   ezetimibe (ZETIA) 10 MG tablet Take 1 tablet (10 mg total) by mouth daily. 90 tablet 3   hydrochlorothiazide (HYDRODIURIL) 25 MG tablet Take 1 tablet (25 mg total) by mouth daily. 90 tablet 4   isosorbide mononitrate (IMDUR) 60 MG 24 hr tablet Take 1 tablet (60 mg total) by mouth daily. 90 tablet 4   lidocaine (LIDODERM) 5 % Place 1 patch onto the skin daily. Remove & Discard patch within 12 hours or as directed by MD (Patient taking differently: Place 1 patch onto the skin as needed. Remove & Discard patch within 12 hours or as directed by MD) 30 patch 5   losartan (COZAAR) 100 MG tablet Take 1 tablet (100 mg total) by mouth daily. 90 tablet 4   nitroGLYCERIN (NITROSTAT) 0.4 MG SL tablet DISSOLVE ONE TABLET UNDER THE TONGUE EVERY 5 MINUTES AS NEEDED FOR CHEST PAIN.  DO NOT EXCEED A TOTAL OF 3 DOSES IN 15 MINUTES 25 tablet 0   Omega-3 Fatty Acids (FISH OIL PO) Take 1 tablet by mouth as needed.     rosuvastatin (CRESTOR) 40 MG tablet Take 1 tablet by mouth once daily 90 tablet 2   spironolactone (ALDACTONE) 25 MG tablet Take 1 tablet (25 mg total) by mouth every other day. 90 tablet 4   UNABLE TO FIND Take 1 tablet by mouth daily as needed. Neo-Cell     Cyanocobalamin (VITAMIN B12 PO) Take 1 tablet by mouth as needed. (Patient not taking: Reported on 08/16/2023)     MAGNESIUM PO Take 1 tablet by mouth as needed. (Patient not taking: Reported on 08/16/2023)     No current facility-administered medications for this visit.    Allergies:   Brilinta [ticagrelor], Lipitor [atorvastatin], Latex, Penicillins, and Tekturna [aliskiren]    Social History:  The patient  reports that she has never smoked. She has never used smokeless tobacco. She reports that she does not drink alcohol and does not use drugs.   Family History:  The patient's family history includes Healthy in her brother and mother; Kidney disease in her father; Prostate cancer in her  father.    ROS:  Please see the history of present illness.   Otherwise, review of  systems are positive for none.   All other systems are reviewed and negative.    PHYSICAL EXAM: VS:  BP (!) 132/96 (BP Location: Left Arm, Patient Position: Sitting, Cuff Size: Large)   Pulse 73   Ht 5\' 2"  (1.575 m)   Wt 227 lb 9.6 oz (103.2 kg)   LMP  (LMP Unknown)   BMI 41.63 kg/m  , BMI Body mass index is 41.63 kg/m. GEN: Well nourished, well developed, in no acute distress  HEENT: normal  Neck: no JVD, carotid bruits, or masses Cardiac: RRR; no murmurs, rubs, or gallops,no edema  Respiratory:  clear to auscultation bilaterally, normal work of breathing GI: soft, nontender, nondistended, + BS MS: no deformity or atrophy  Skin: warm and dry, no rash Neuro:  Strength and sensation are intact Psych: euthymic mood, full affect   EKG:  EKG is ordered today. The ekg ordered today demonstrates :  Normal sinus rhythm Moderate voltage criteria for LVH, may be normal variant ( R in aVL , Cornell product ) Nonspecific T wave abnormality When compared with ECG of 04-Jul-2022 16:37, Borderline criteria for Anterior infarct are no longer Present Borderline criteria for Anterolateral infarct are no longer Present Criteria for Inferior infarct are no longer Present Nonspecific T wave abnormality has replaced inverted T waves in Inferior leads Nonspecific T wave abnormality, worse in Lateral leads    Recent Labs: 07/19/2023: ALT 14; Hemoglobin 13.5; Magnesium 2.0; Platelets 228; TSH 2.450 07/27/2023: BUN 10; Creatinine, Ser 0.84; Potassium 4.3; Sodium 135    Lipid Panel    Component Value Date/Time   CHOL 147 07/19/2023 1410   CHOL 206 (H) 12/28/2017 1356   TRIG 79 07/19/2023 1410   TRIG 136 12/28/2017 1356   HDL 39 (L) 07/19/2023 1410   CHOLHDL 3.8 06/23/2022 0504   VLDL 13 06/23/2022 0504   VLDL 27 12/28/2017 1356   LDLCALC 93 07/19/2023 1410      Wt Readings from Last 3 Encounters:   08/16/23 227 lb 9.6 oz (103.2 kg)  07/20/23 227 lb 14.4 oz (103.4 kg)  07/19/23 225 lb 9.6 oz (102.3 kg)           No data to display            ASSESSMENT AND PLAN:  1.  Coronary artery disease involving native coronary arteries without angina: She is doing well overall with no anginal symptoms.  Continue lifelong dual antiplatelet therapy as tolerated given multiple coronary stents.  2. Palpitations: She has diltiazem to be used as needed.  She did not tolerate small dose Toprol in the past due to fatigue.  3. Essential hypertension: Blood pressure is reasonably controlled.  4. Hyperlipidemia: She is on maximal dose rosuvastatin 40 mg once daily.  Most recent lipid profile in November showed an LDL of 93.  Since then, ezetimibe 10 mg once daily was added.  Hopefully this will get her LDL below 70.  5. Chronic diastolic heart failure: She appears to be euvolemic.  6. 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  This is likely only borderline dilated considering her age and BSA.    Disposition:   FU with me in 6 months  Signed,  Lorine Bears, MD  08/16/2023 3:04 PM    [facility]

## 2023-10-16 NOTE — Patient Instructions (Incomplete)
Heartburn Heartburn is when you feel pain or discomfort in your throat or chest. It may feel like a burning pain. It can also cause a bad, acid-like taste in your mouth. It may feel worse: When you lie down. When you bend over. At night. Heartburn may be caused by the contents in your stomach moving back up into your esophagus. Your esophagus is the part of your body that moves food from your mouth to your stomach. Follow these instructions at home: Eating and drinking Follow an eating plan as told by your health care provider. Avoid certain foods and drinks as told. These may include: Coffee and tea, with or without caffeine. Alcohol. Energy drinks and sports drinks. Fizzy drinks or sodas. Chocolate and cocoa. Peppermint and mint flavorings. Garlic and onions. Horseradish. Spicy and acidic foods. These include: Peppers. Chili powder and curry powder. Vinegar. Hot sauces and BBQ sauce. Citrus fruits and juices. These include: Oranges. Lemons. Limes. Tomato-based foods. These include: Red sauce and pizza with red sauce. Chili. Salsa. Fried and fatty foods. These include: Donuts. [place] fries. Potato chips. High-fat dressings. High-fat meats. These include: Hot dogs and sausage. Rib eye steak. Ham and bacon. High-fat dairy items. These include: Whole milk. Butter. Cream cheese. Eat small meals often. Avoid eating big meals. Avoid drinking lots of liquid with your meals. Try not to eat meals during the 2-3 hours before bedtime. Try not to lie down right after you eat. Do not exercise right after you eat. Lifestyle  If you're overweight, lose an amount of weight that's healthy for you. Ask your provider about a safe weight loss goal. Do not smoke, vape, or use nicotine or tobacco. These can make your symptoms worse. If you need help quitting, talk with your provider. Wear loose clothes. Do not wear things that are tight around your waist. When you sleep,  try: Raising the head of your bed about 6 inches (15 cm). You can use a wedge to do this. Lying down on your left side. Try to lower your stress. If you need help doing this, ask your provider. Medicines Take your medicines only as told by your provider. Do not take aspirin or ibuprofen unless you're told to. Stop medicines only as told by your provider. If you stop taking some medicines too quickly, your symptoms may get worse. General instructions Watch for any changes in your symptoms. Contact a health care provider if: You have new symptoms. You have weight loss for no known reason. You have trouble swallowing. It hurts to swallow. You have wheezing. This is when you make high-pitched whistling sounds when you breathe, most often when you breathe out. You have a cough that won't go away. Your symptoms don't get better with treatment. You have heartburn often for more than 2 weeks. Get help right away if: You have pain all of a sudden in your: Arms. Neck. Jaw. Teeth. Back. You feel sweaty, dizzy, or light-headed all of a sudden. You have chest pain or shortness of breath. You vomit and your vomit looks like blood or coffee grounds. Your poop is bloody or black. These symptoms may be an emergency. Call 911 right away. Do not wait to see if the symptoms will go away. Do not drive yourself to the hospital. This information is not intended to replace advice given to you by your health care provider. Make sure you discuss any questions you have with your health care provider. Document Revised: 06/28/2023 Document Reviewed: 01/07/2023 Elsevier Patient Education  2024 Elsevier Inc[employer]

## 2023-10-19 ENCOUNTER — Ambulatory Visit: Payer: 59 | Admitting: Nurse Practitioner

## 2023-10-25 ENCOUNTER — Ambulatory Visit (INDEPENDENT_AMBULATORY_CARE_PROVIDER_SITE_OTHER): Payer: 59 | Admitting: Obstetrics and Gynecology

## 2023-10-25 ENCOUNTER — Ambulatory Visit: Payer: 59 | Admitting: Nurse Practitioner

## 2023-10-25 ENCOUNTER — Encounter: Payer: Self-pay | Admitting: Obstetrics and Gynecology

## 2023-10-25 VITALS — BP 134/85 | HR 87 | Resp 16 | Ht 62.0 in | Wt 230.2 lb

## 2023-10-25 DIAGNOSIS — R102 Pelvic and perineal pain: Secondary | ICD-10-CM | POA: Diagnosis not present

## 2023-10-25 DIAGNOSIS — N952 Postmenopausal atrophic vaginitis: Secondary | ICD-10-CM | POA: Diagnosis not present

## 2023-10-25 DIAGNOSIS — R3129 Other microscopic hematuria: Secondary | ICD-10-CM | POA: Diagnosis not present

## 2023-10-25 LAB — POCT URINALYSIS DIPSTICK
Bilirubin, UA: NEGATIVE
Blood, UA: POSITIVE
Glucose, UA: NEGATIVE
Ketones, UA: NEGATIVE
Leukocytes, UA: NEGATIVE
Nitrite, UA: NEGATIVE
Protein, UA: POSITIVE — AB
Spec Grav, UA: 1.02 (ref 1.010–1.025)
Urobilinogen, UA: 0.2 U/dL
pH, UA: 6 (ref 5.0–8.0)

## 2023-10-25 MED ORDER — ESTRADIOL 0.1 MG/GM VA CREA: TOPICAL_CREAM | VAGINAL | 12 refills | Status: AC

## 2023-10-25 MED ORDER — ESTRADIOL 0.1 MG/GM VA CREA: TOPICAL_CREAM | VAGINAL | 12 refills | Status: DC

## 2023-10-25 NOTE — Progress Notes (Signed)
    GYNECOLOGY PROGRESS NOTE  Subjective:    Patient ID: Jennifer Grant, female    DOB: 09/01/1950, 73 y.o.   MRN: [id number]  HPI  Patient is a 73 y.o. [id number] female who presents for evaluation of pelvic pain. She complains that she has pelvic pain, that feels like burning. Pain comes and goes and she has had this pain for a long time. She denies having UTI symptoms.  Notes that when she uses olive oil in the vaginal area that it helps.   The following portions of the patient's history were reviewed and updated as appropriate: allergies, current medications, past family history, past medical history, past social history, past surgical history, and problem list.  Review of Systems Pertinent items noted in HPI and remainder of comprehensive ROS otherwise negative.   Objective:   Blood pressure 134/85, pulse 87, resp. rate 16, height 5\' 2"  (1.575 m), weight 230 lb 3.2 oz (104.4 kg).  Body mass index is 42.1 kg/m. General appearance: alert and no distress Abdomen: soft, non-tender; bowel sounds normal; no masses,  no organomegaly Pelvic: external genitalia normal, rectovaginal septum normal.  Vagina without discharge, moderate atrophy present. Uterus, cervix, and adnexae surgically absent.  Extremities: extremities normal, atraumatic, no cyanosis or edema Neurologic: Grossly normal   Labs: Results for orders placed or performed in visit on 10/25/23  POCT Urinalysis Dipstick   Collection Time: 10/25/23  3:07 PM  Result Value Ref Range   Color, UA     Clarity, UA     Glucose, UA Negative Negative   Bilirubin, UA Negative    Ketones, UA Negative    Spec Grav, UA 1.020 1.010 - 1.025   Blood, UA Positive    pH, UA 6.0 5.0 - 8.0   Protein, UA Positive (A) Negative   Urobilinogen, UA 0.2 0.2 or 1.0 E.U./dL   Nitrite, UA Negative    Leukocytes, UA Negative Negative   Appearance     Odor       Assessment:   1. Pelvic pain   2. Vaginal atrophy   3. Other microscopic hematuria       Plan:   - Discussed that her pain may likely be related to her vaginal atrophy, especially in light of the fact that when she utilizes olive oil to the area that it temporarily relieves her pain. Discussed that she could use olive oil on a more scheduled regimen as a natural alternative, or could consider use of hormonal vaginal cream.  Will prescribe Estrace cream.  To f/u if symptoms persist or fail to improve  - Hematuria noted on exam today with no other findings. No prior history. No UTI symptoms. No other obvious sources of bleeding on exam. Continue to monitor and if persistent, may require f/u with Urology for further evaluation.      Hildred Laser, MD [facility]

## 2023-11-06 NOTE — Patient Instructions (Signed)
 Shoulder Pain  Many things can cause shoulder pain, including:  An injury.  Moving the shoulder in the same way again and again (overuse).  Joint pain (arthritis).  Pain can come from:  Swelling and irritation (inflammation) of any part of the shoulder.  An injury to:  The shoulder joint.  Tissues that connect muscle to bone (tendons).  Tissues that connect bones to each other (ligaments).  Bones.  Follow these instructions at home:  Watch for changes in your symptoms. Let your doctor know about them. Follow these instructions to help with your pain.  If you have a sling that can be taken off:  Wear the sling as told by your doctor. Take it off only as told by your doctor.  Check the skin around the sling every day. Tell your doctor if you see problems.  Loosen the sling if your fingers:  Tingle.  Become numb.  Become cold.  Keep the sling clean.  If the sling is not waterproof:  Do not let it get wet.  Take the sling off when you shower or bathe.  Managing pain, stiffness, and swelling    If told, put ice on the painful area.  Put ice in a plastic bag.  Place a towel between your skin and the bag.  Leave the ice on for 20 minutes, 2-3 times a day. Stop putting ice on if it does not help with the pain.  If your skin turns bright red, take off the ice right away to prevent skin damage. The risk of damage is higher if you cannot feel pain, heat, or cold.  Squeeze a soft ball or a foam pad as much as possible. This prevents swelling in the shoulder. It also helps to strengthen the arm.  General instructions  Take over-the-counter and prescription medicines only as told by your doctor.  Keep all follow-up visits. This will help you avoid any type of permanent shoulder problems.  Contact a doctor if:  Your pain gets worse.  Medicine does not help your pain.  You have new pain in your arm, hand, or fingers.  You loosen your sling and your arm, hand, or fingers:  Tingle.  Are numb.  Are swollen.  Get help right away  if:  Your arm, hand, or fingers turn white or blue.  This information is not intended to replace advice given to you by your health care provider. Make sure you discuss any questions you have with your health care provider.  Document Revised: 03/19/2022 Document Reviewed: 03/19/2022  Elsevier Patient Education  2024 [employer].

## 2023-11-08 ENCOUNTER — Ambulatory Visit (INDEPENDENT_AMBULATORY_CARE_PROVIDER_SITE_OTHER): Payer: 59 | Admitting: Nurse Practitioner

## 2023-11-08 ENCOUNTER — Encounter: Payer: Self-pay | Admitting: Nurse Practitioner

## 2023-11-08 VITALS — BP 137/86 | HR 72 | Temp 98.0°F | Ht 62.0 in | Wt 227.4 lb

## 2023-11-08 DIAGNOSIS — R1084 Generalized abdominal pain: Secondary | ICD-10-CM | POA: Diagnosis not present

## 2023-11-08 DIAGNOSIS — Z111 Encounter for screening for respiratory tuberculosis: Secondary | ICD-10-CM

## 2023-11-08 NOTE — Progress Notes (Signed)
 BP 137/86   Pulse 72   Temp 98 F (36.7 C) (Oral)   Ht 5\' 2"  (1.575 m)   Wt 227 lb 6.4 oz (103.1 kg)   LMP  (LMP Unknown)   SpO2 100%   BMI 41.59 kg/m    Subjective:    Patient ID: Jennifer Grant, female    DOB: 01/04/1951, 73 y.o.   MRN: [id number]  HPI: Jennifer Grant is a 73 y.o. female  Chief Complaint  Patient presents with   Burning Sensation    Patient states she has been experiencing burning in her stomach that goes around her side and to her shoulder. States that this has been going on for a few weeks.    BURNING SENSATION STOMACH Is having burning sensation in stomach, burns like pepper.  Comes into mid-abdomen and wraps around up to shoulder right side.  Her daughter-in-law reports areas get hot to touch.  Has been going on three weeks.  Has never had an endoscopy.  Does not get acid reflux with this.  Is not eating as much spicy food lately.  She would like imaging since this issue is recurrent in nature.  Saw GYN on 10/25/23 for pelvic pain, was put on vaginal estrace. Fever: no Nausea: no Vomiting: no Weight loss: no Decreased appetite: no Diarrhea: no Constipation: yes Blood in stool: no Heartburn: no Jaundice: no Rash: no Dysuria/urinary frequency: no Hematuria: no  Relevant past medical, surgical, family and social history reviewed and updated as indicated. Interim medical history since our last visit reviewed. Allergies and medications reviewed and updated.  Review of Systems  Constitutional:  Negative for activity change, appetite change, diaphoresis, fatigue and fever.  Respiratory:  Negative for cough, chest tightness, shortness of breath and wheezing.   Cardiovascular:  Positive for leg swelling (occasional). Negative for chest pain and palpitations.  Gastrointestinal:  Positive for constipation (was but started taking Senna and this is okay now). Negative for abdominal distention, abdominal pain, diarrhea, nausea and vomiting.  Neurological:  Negative.   Psychiatric/Behavioral: Negative.     Per HPI unless specifically indicated above     Objective:    BP 137/86   Pulse 72   Temp 98 F (36.7 C) (Oral)   Ht 5\' 2"  (1.575 m)   Wt 227 lb 6.4 oz (103.1 kg)   LMP  (LMP Unknown)   SpO2 100%   BMI 41.59 kg/m   Wt Readings from Last 3 Encounters:  11/08/23 227 lb 6.4 oz (103.1 kg)  10/25/23 230 lb 3.2 oz (104.4 kg)  08/16/23 227 lb 9.6 oz (103.2 kg)    Physical Exam Vitals and nursing note reviewed.  Constitutional:      General: She is awake. She is not in acute distress.    Appearance: She is well-developed and well-groomed. She is obese. She is not ill-appearing or toxic-appearing.  HENT:     Head: Normocephalic.     Right Ear: Hearing and external ear normal.     Left Ear: Hearing and external ear normal.  Eyes:     General: Lids are normal.        Right eye: No discharge.        Left eye: No discharge.     Conjunctiva/sclera: Conjunctivae normal.     Pupils: Pupils are equal, round, and reactive to light.  Neck:     Thyroid: No thyromegaly.     Vascular: No carotid bruit.  Cardiovascular:     Rate and  Rhythm: Normal rate and regular rhythm.     Heart sounds: Normal heart sounds. No murmur heard.    No gallop.  Pulmonary:     Effort: Pulmonary effort is normal. No accessory muscle usage or respiratory distress.     Breath sounds: Normal breath sounds.  Abdominal:     General: Bowel sounds are normal. There is no distension.     Palpations: Abdomen is soft. There is no mass.     Tenderness: There is no abdominal tenderness. There is no right CVA tenderness, left CVA tenderness, guarding or rebound.  Musculoskeletal:     Cervical back: Normal range of motion and neck supple.     Right lower leg: No edema.     Left lower leg: No edema.  Lymphadenopathy:     Cervical: No cervical adenopathy.  Skin:    General: Skin is warm and dry.  Neurological:     Mental Status: She is alert and oriented to person,  place, and time.     Deep Tendon Reflexes: Reflexes are normal and symmetric.     Reflex Scores:      Brachioradialis reflexes are 2+ on the right side and 2+ on the left side.      Patellar reflexes are 2+ on the right side and 2+ on the left side. Psychiatric:        Attention and Perception: Attention normal.        Mood and Affect: Mood normal.        Speech: Speech normal.        Behavior: Behavior normal. Behavior is cooperative.        Thought Content: Thought content normal.     Results for orders placed or performed in visit on 10/25/23  POCT Urinalysis Dipstick   Collection Time: 10/25/23  3:07 PM  Result Value Ref Range   Color, UA     Clarity, UA     Glucose, UA Negative Negative   Bilirubin, UA Negative    Ketones, UA Negative    Spec Grav, UA 1.020 1.010 - 1.025   Blood, UA Positive    pH, UA 6.0 5.0 - 8.0   Protein, UA Positive (A) Negative   Urobilinogen, UA 0.2 0.2 or 1.0 E.U./dL   Nitrite, UA Negative    Leukocytes, UA Negative Negative   Appearance     Odor        Assessment & Plan:   Problem List Items Addressed This Visit       Other   Generalized abdominal pain - Primary   Ongoing for 3 weeks, similar episodes in past.  We have tried various OTC and prescription reflux medications.  Will obtain imaging per request, ?hiatal hernia.  Recommend she continue to focus on diet and monitor foods that cause worsening pain, avoid these.  Continue current bowel regimen which works well for her.  Labs today.  Determine next steps after all labs and imaging return.      Relevant Orders   HgB A1c   Comp Met (CMET)   CBC with Differential/Platelet   CT ABDOMEN PELVIS WO CONTRAST   Other Visit Diagnoses       Screening-pulmonary TB       Requested by patient today.   Relevant Orders   QuantiFERON-TB Gold Plus        Follow up plan: Return for as scheduled May 2025.

## 2023-11-08 NOTE — Assessment & Plan Note (Signed)
 Ongoing for 3 weeks, similar episodes in past.  We have tried various OTC and prescription reflux medications.  Will obtain imaging per request, ?hiatal hernia.  Recommend she continue to focus on diet and monitor foods that cause worsening pain, avoid these.  Continue current bowel regimen which works well for her.  Labs today.  Determine next steps after all labs and imaging return.

## 2023-11-09 NOTE — Progress Notes (Signed)
 Good morning, please let Blen know her labs have returned, she has not checked MyChart since 2022: - A1c continues to show prediabetes, remaining similar to last check.  I recommend continue focus on diet changes and regular activity. - Kidney and liver function are normal. - CBC shows no infection or anemia.  - TB testing not back yet, but if abnormal will let her know.  Any questions? Keep being stellar!!  Thank you for allowing me to participate in your care.  I appreciate you. Kindest regards, Ersa Delaney

## 2023-11-11 LAB — COMPREHENSIVE METABOLIC PANEL
ALT: 13 IU/L (ref 0–32)
AST: 26 IU/L (ref 0–40)
Albumin: 4 g/dL (ref 3.8–4.8)
Alkaline Phosphatase: 65 IU/L (ref 44–121)
BUN/Creatinine Ratio: 10 — ABNORMAL LOW (ref 12–28)
BUN: 8 mg/dL (ref 8–27)
Bilirubin Total: 0.5 mg/dL (ref 0.0–1.2)
CO2: 18 mmol/L — ABNORMAL LOW (ref 20–29)
Calcium: 9.9 mg/dL (ref 8.7–10.3)
Chloride: 100 mmol/L (ref 96–106)
Creatinine, Ser: 0.81 mg/dL (ref 0.57–1.00)
Globulin, Total: 3.3 g/dL (ref 1.5–4.5)
Glucose: 84 mg/dL (ref 70–99)
Potassium: 4 mmol/L (ref 3.5–5.2)
Sodium: 138 mmol/L (ref 134–144)
Total Protein: 7.3 g/dL (ref 6.0–8.5)
eGFR: 76 mL/min/{1.73_m2} (ref >59)

## 2023-11-11 LAB — HEMOGLOBIN A1C
Est. average glucose Bld gHb Est-mCnc: 126 mg/dL
Hgb A1c MFr Bld: 6 % — ABNORMAL HIGH (ref 4.8–5.6)

## 2023-11-11 LAB — QUANTIFERON-TB GOLD PLUS: QuantiFERON Nil Value: 3.88 [IU]/mL

## 2023-11-11 LAB — CBC WITH DIFFERENTIAL/PLATELET
Basophils Absolute: 0.1 10*3/uL (ref 0.0–0.2)
Basos: 1 %
EOS (ABSOLUTE): 0.1 10*3/uL (ref 0.0–0.4)
Eos: 2 %
Hematocrit: 42.1 % (ref 34.0–46.6)
Hemoglobin: 13.5 g/dL (ref 11.1–15.9)
Immature Grans (Abs): 0 10*3/uL (ref 0.0–0.1)
Immature Granulocytes: 0 %
Lymphocytes Absolute: 2.9 10*3/uL (ref 0.7–3.1)
Lymphs: 44 %
MCH: 26.1 pg — ABNORMAL LOW (ref 26.6–33.0)
MCHC: 32.1 g/dL (ref 31.5–35.7)
MCV: 81 fL (ref 79–97)
Monocytes Absolute: 0.5 10*3/uL (ref 0.1–0.9)
Monocytes: 8 %
Neutrophils Absolute: 3 10*3/uL (ref 1.4–7.0)
Neutrophils: 45 %
Platelets: 245 10*3/uL (ref 150–450)
RBC: 5.17 x10E6/uL (ref 3.77–5.28)
RDW: 15.7 % — ABNORMAL HIGH (ref 11.7–15.4)
WBC: 6.6 10*3/uL (ref 3.4–10.8)

## 2023-11-12 ENCOUNTER — Encounter: Payer: Self-pay | Admitting: Nurse Practitioner

## 2023-11-12 DIAGNOSIS — R7612 Nonspecific reaction to cell mediated immunity measurement of gamma interferon antigen response without active tuberculosis: Secondary | ICD-10-CM | POA: Insufficient documentation

## 2023-11-12 NOTE — Progress Notes (Signed)
 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  I believe she reported having a full work-up for this in past, please check on this . She will need to go to health department for further assessment.  We can obtain chest imaging outpatient at [facility].  Any questions?

## 2023-11-16 ENCOUNTER — Telehealth: Payer: Self-pay

## 2023-11-16 NOTE — Telephone Encounter (Signed)
 Copied from CRM [id number]. Topic: Clinical - Lab/Test Results >> Nov 16, 2023  4:28 PM Franchot Heidelberg wrote: Reason for CRM: Pt's daughter called for advice on what to do now that the pt has tested positive. She has been made aware of the notes from the PCP but has additional questions.   Best contact: [phone]

## 2023-11-16 NOTE — Telephone Encounter (Signed)
 Visit with provider, virtual is preferred would be ideal to review any additional questions.

## 2023-11-17 ENCOUNTER — Other Ambulatory Visit: Payer: Self-pay

## 2023-11-17 ENCOUNTER — Ambulatory Visit: Payer: Self-pay

## 2023-11-17 VITALS — Wt 227.0 lb

## 2023-11-17 DIAGNOSIS — R7612 Nonspecific reaction to cell mediated immunity measurement of gamma interferon antigen response without active tuberculosis: Secondary | ICD-10-CM

## 2023-11-17 NOTE — Progress Notes (Signed)
 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  Patient walk-in to [facility]. TB clinic to follow-up positive QFT. History of LTBI. Last TB testing over 5 years ago, record not available. CXR done in 05/2022 and 06/2022.   EPI completed today 11/18/2023 The patient has no signs or symptoms of TB disease.   Patient declined LTBI treatment. TB nurse will consult with Dr. Wyvonnia Lora and contact patient with follow-up recommendations. Lavinia Sharps, RN

## 2023-11-18 NOTE — Telephone Encounter (Signed)
 Called all number on file and was not able to reach or leave a message for patient to call the office and schedule a virtual visit provider about test results

## 2023-11-20 NOTE — Telephone Encounter (Signed)
 Patient with history of LTBI, visit note form 11/18/2023 reviewed by Dr. Wyvonnia Lora. Phone call to patient, TB clinic provider's recommendation reviewed with patient. Mrs. Mallo declined LTBI treatment. Advised to contact primary care provider or [facility] clinic nurse if symptoms of active TB disease: fever, night sweats, cough, loss of appetite, unintentional weight loss or other concerning symptoms. Patient states understanding and agrees. Lavinia Sharps, RN

## 2023-11-29 ENCOUNTER — Ambulatory Visit
Admission: RE | Admit: 2023-11-29 | Discharge: 2023-11-29 | Disposition: A | Discharge status: Home / Self Care | Source: Ambulatory Visit | Attending: Nurse Practitioner | Admitting: Nurse Practitioner

## 2023-11-29 DIAGNOSIS — R1084 Generalized abdominal pain: Secondary | ICD-10-CM | POA: Insufficient documentation

## 2023-11-29 DIAGNOSIS — R109 Unspecified abdominal pain: Secondary | ICD-10-CM | POA: Diagnosis not present

## 2023-11-29 DIAGNOSIS — K439 Ventral hernia without obstruction or gangrene: Secondary | ICD-10-CM | POA: Diagnosis not present

## 2023-11-29 DIAGNOSIS — K8020 Calculus of gallbladder without cholecystitis without obstruction: Secondary | ICD-10-CM | POA: Diagnosis not present

## 2024-01-14 DIAGNOSIS — Z227 Latent tuberculosis: Secondary | ICD-10-CM | POA: Insufficient documentation

## 2024-01-14 NOTE — Patient Instructions (Signed)
 Tuberculosis Tuberculosis (TB) is an infection that usually affects your lungs. It can affect other parts of your body too. There are two kinds of TB: Active TB. This means you have symptoms of TB. It also means your infection can spread to someone (you are contagious). Latent TB. This means you do not have any symptoms of TB. It also means you cannot spread TB to someone. It is important to get treatment no matter what kind of TB you have. What are the causes? TB is caused by bacteria called Mycobacterium tuberculosis. You can get the bacteria by breathing in droplets from a cough or sneeze from someone who has active TB. What increases the risk? You are more likely to develop this condition if you: Have human immunodeficiency virus (HIV) or AIDS. Have diabetes. Are older. Babies are also more likely to get TB. Use drugs. Use tobacco. Live in or travel to areas where there are high cases of TB, such as: [place]. [place]. [place]. [place]. [place]. Live or work in areas that may be very crowded or where the airflow is not very good, including: Health care facilities. Residential care facilities, such as an assisted living facility. Refugee camps or homeless shelters. What are the signs or symptoms? Symptoms of this condition include: Coughing up blood, mucus from the lungs (sputum), or both. A cough that lasts three weeks or longer. Chest pain, or pain while breathing or coughing. Not being hungry or losing weight without trying. Tiredness (fatigue) and weakness. Fever, sweating, or chills. How is this treated? Both types of TB are treated with antibiotic medicine. You may need to take antibiotics for up to 6-9 months. Follow these instructions at home:     Medicines Take over-the-counter and prescription medicines as told by your doctor. Finish your antibiotics even if you start to feel better. Activity Rest as needed. Ask your doctor what  activities are safe for you. Do not go back to work or school until your doctor says it is safe to do that. Avoid close contact with others (especially babies and older people). Do this until your doctor says you will no longer spread TB. General instructions Tell your doctor about all the people you live with and all the people you have close contact with. Those people may need to be tested for TB. Do not smoke or use any products that contain nicotine or tobacco. If you need help quitting, ask your doctor. Cover your mouth and nose when you cough or sneeze. Get rid of used tissues as told by your doctor. Wash your hands often with soap and water. If you cannot use soap and water, use hand sanitizer. You will need to get blood tests or chest X-rays to check for active TB. Do not have the TB skin test done after having TB or after being around someone with TB. Keep all follow-up visits. Contact a doctor if: You have new symptoms. You are not hungry. You feel like you may vomit (nauseous). You vomit. Your pee (urine) is dark yellow. Your skin or the white part of your eyes turns yellow (jaundice). Your symptoms get worse. Your symptoms do not go away with treatment. You have a fever. Get help right away if: You have chest pain. You cough up blood. You have trouble breathing. You feel short of breath. You have a headache. You have a stiff neck. Summary Tuberculosis (TB) is an infection that usually affects your lungs. It can affect other parts of your  body too. The two kinds of TB are active TB and latent TB. Get treatment no matter what kind of TB you have. Take over-the-counter and prescription medicines as told by your doctor. Finish your antibiotics even if you start to feel better. This information is not intended to replace advice given to you by your health care provider. Make sure you discuss any questions you have with your health care provider. Document Revised: 09/22/2021  Document Reviewed: 09/22/2021 Elsevier Patient Education  2024 [employer].

## 2024-01-16 ENCOUNTER — Encounter: Payer: Self-pay | Admitting: Nurse Practitioner

## 2024-01-16 ENCOUNTER — Ambulatory Visit (INDEPENDENT_AMBULATORY_CARE_PROVIDER_SITE_OTHER): Payer: 59 | Admitting: Nurse Practitioner

## 2024-01-16 VITALS — BP 136/80 | HR 82 | Temp 98.0°F | Ht 62.0 in | Wt 229.2 lb

## 2024-01-16 DIAGNOSIS — I700 Atherosclerosis of aorta: Secondary | ICD-10-CM | POA: Diagnosis not present

## 2024-01-16 DIAGNOSIS — I10 Essential (primary) hypertension: Secondary | ICD-10-CM

## 2024-01-16 DIAGNOSIS — R7309 Other abnormal glucose: Secondary | ICD-10-CM | POA: Diagnosis not present

## 2024-01-16 DIAGNOSIS — I5032 Chronic diastolic (congestive) heart failure: Secondary | ICD-10-CM

## 2024-01-16 DIAGNOSIS — I25118 Atherosclerotic heart disease of native coronary artery with other forms of angina pectoris: Secondary | ICD-10-CM | POA: Diagnosis not present

## 2024-01-16 DIAGNOSIS — I77810 Thoracic aortic ectasia: Secondary | ICD-10-CM

## 2024-01-16 DIAGNOSIS — Z227 Latent tuberculosis: Secondary | ICD-10-CM

## 2024-01-16 DIAGNOSIS — I471 Supraventricular tachycardia, unspecified: Secondary | ICD-10-CM

## 2024-01-16 DIAGNOSIS — E782 Mixed hyperlipidemia: Secondary | ICD-10-CM

## 2024-01-16 DIAGNOSIS — Z91148 Patient's other noncompliance with medication regimen for other reason: Secondary | ICD-10-CM

## 2024-01-16 LAB — MICROALBUMIN, URINE WAIVED
Creatinine, Urine Waived: 50 mg/dL (ref 10–300)
Microalb, Ur Waived: 10 mg/L (ref 0–19)

## 2024-01-16 LAB — BAYER DCA HB A1C WAIVED: HB A1C (BAYER DCA - WAIVED): 5.9 % — ABNORMAL HIGH (ref 4.8–5.6)

## 2024-01-16 NOTE — Assessment & Plan Note (Signed)
 Intermittent issues, recommend she bring all medications to all provider visits so we can review.

## 2024-01-16 NOTE — Assessment & Plan Note (Signed)
Chronic, ongoing.  Noted on CXR 04/16/21. Check lipid panel today.  Continue preventative statin and ASA.

## 2024-01-16 NOTE — Assessment & Plan Note (Signed)
 BMI 41.92 with HTN, OLD MI, HLD. Recommended eating smaller high protein, low fat meals more frequently and exercising 30 mins a day 5 times a week with a goal of 10-15lb weight loss in the next 3 months. Patient voiced their understanding and motivation to adhere to these recommendations.

## 2024-01-16 NOTE — Assessment & Plan Note (Signed)
 A1c 5.9%today which is trend down, continue Losartan  for kidney protection and recommend patient focus heavily on diet changes and regular activity. Urine ALB 07 Jan 2024.

## 2024-01-16 NOTE — Assessment & Plan Note (Signed)
 Chronic, ongoing.  Continue current medication regimen and adjust as needed.  Lipid panel today.  Have recommended she take medications daily to help with prevention.  Discussed this at length with patient.

## 2024-01-16 NOTE — Assessment & Plan Note (Signed)
Ongoing, continue collaboration with cardiology. Recent noted reviewed.

## 2024-01-16 NOTE — Assessment & Plan Note (Signed)
 Chronic, ongoing.  BP at goal in office today on recheck.  Is working on cutting back on sodium. Is taking medications more consistently.  Recommend ensuring medication taken daily + checking BP at home at least a few mornings a week.  Continue collaboration with cardiology and review notes.  Continue current medication regimen at this time, suspect adjustments will need to be made in future if any elevations in BP.  Labs: CMP, CBC. Continue to collaborate with cardiology.

## 2024-01-16 NOTE — Assessment & Plan Note (Signed)
Chronic, ongoing.  Euvolemic on exam.  Continue current medication regimen and collaboration with cardiology.  Discussed diet choices and to cut back on sodium intake, she culturally eats a higher sodium diet with lots of spicier foods.   - Reminded to call for an overnight weight gain of >2 pounds or a weekly weight weight of >5 pounds - not adding salt to his food and has been reading food labels. Reviewed the importance of keeping daily sodium intake to 2000mg  daily  - No Ibuprofen products

## 2024-01-16 NOTE — Assessment & Plan Note (Signed)
Stable.  Followed by cardiology, continue this collaboration.  Tried taking Metoprolol, reports she never could tolerate.

## 2024-01-16 NOTE — Assessment & Plan Note (Signed)
 History of positive testing and current positive testing.  She refuses any form of treatment.  Agrees to [employer] 5 year CXR.  Next due 07/05/2027.  Health department aware and have talked to patient.

## 2024-01-16 NOTE — Assessment & Plan Note (Signed)
 Chronic, ongoing.  Continue current medication regimen and collaboration with cardiology.  No recent NTG use, will send in refill as needed.  She has used Diltiazem  once or twice for RUQ pain that radiates into epigastric area and right chest.  Reports this helps.

## 2024-01-16 NOTE — Progress Notes (Signed)
 BP 136/80 (BP Location: Left Arm, Patient Position: Sitting, Cuff Size: Large)   Pulse 82   Temp 98 F (36.7 C) (Oral)   Ht 5\' 2"  (1.575 m)   Wt 229 lb 3.2 oz (104 kg)   LMP  (LMP Unknown)   SpO2 97%   BMI 41.92 kg/m    Subjective:    Patient ID: Jennifer Grant, female    DOB: 09-Nov-1950, 73 y.o.   MRN: [id number]  HPI: Jennifer Grant is a 73 y.o. female  Chief Complaint  Patient presents with   Hypertension   Hyperlipidemia   Congestive Heart Failure   Impaired Fasting Glucose   HYPERTENSION / HYPERLIPIDEMIA/HF Continues Losartan , Amlodipine  (she reports she is having skin issues with this), HCTZ, Isosorbide  Mono, Spironolactone , Plavix , ASA , Rosuvastatin , and Zetia . Diltiazem  to take as needed for elevation in BP, has taken a few times. No recent NTG use.  Endorses a poor diet at times as enjoys cooking with spices which have lots of salt, traditional food. Metoprolol  in past made her feel very tired.  Last visit with cardiology was on 08/16/23 and they recommend she consider trial of Carvedilol in future. October 2023 echo EF >55% with normal LVH. History of MI in 2004.    History of sleep study noting OSA, refuses to get equipment.  She sleeps on no pillows, sleeps flat.  Recently had requested testing for TB due to history of issues with this in past per her report.  Her Quantiferon did return positive and she went to health department where they discussed latent TB with her.  She refused treatment, reports she had something in past. Satisfied with current treatment? yes Duration of hypertension: chronic BP monitoring frequency: occasional BP range: 120-130/70-80 BP medication side effects: no Duration of hyperlipidemia: chronic Cholesterol medication side effects: no Cholesterol supplements: none Medication compliance: good compliance Aspirin : yes Recent stressors: no Recurrent headaches: no Visual changes: no Palpitations: no Dyspnea: no Chest pain: no Lower  extremity edema: no Dizzy/lightheaded: no   Impaired Fasting Glucose HbA1C:  Lab Results  Component Value Date   HGBA1C 5.9 (H) 01/16/2024  Duration of elevated blood sugar: years Polydipsia: no Polyuria: no Weight change: no Visual disturbance: no Glucose Monitoring: no    Accucheck frequency: Not Checking    Fasting glucose:     Post prandial:  Diabetic Education: Not Completed Family history of diabetes: yes   Relevant past medical, surgical, family and social history reviewed and updated as indicated. Interim medical history since our last visit reviewed. Allergies and medications reviewed and updated.  Review of Systems  Constitutional:  Negative for activity change, appetite change, diaphoresis, fatigue and fever.  Respiratory:  Negative for cough, chest tightness, shortness of breath and wheezing.   Cardiovascular:  Negative for chest pain, palpitations and leg swelling.  Gastrointestinal: Negative.   Endocrine: Negative for cold intolerance, heat intolerance, polydipsia, polyphagia and polyuria.  Neurological:  Negative for dizziness, syncope, weakness, light-headedness, numbness and headaches.  Psychiatric/Behavioral: Negative.      Per HPI unless specifically indicated above     Objective:     BP 136/80 (BP Location: Left Arm, Patient Position: Sitting, Cuff Size: Large)   Pulse 82   Temp 98 F (36.7 C) (Oral)   Ht 5\' 2"  (1.575 m)   Wt 229 lb 3.2 oz (104 kg)   LMP  (LMP Unknown)   SpO2 97%   BMI 41.92 kg/m   Wt Readings from Last 3 Encounters:  01/16/24 229 lb 3.2 oz (104 kg)  11/17/23 227 lb (103 kg)  11/08/23 227 lb 6.4 oz (103.1 kg)    Physical Exam Vitals and nursing note reviewed.  Constitutional:      General: She is awake. She is not in acute distress.    Appearance: She is well-developed and well-groomed. She is obese. She is not ill-appearing or toxic-appearing.  HENT:     Head: Normocephalic.     Right Ear: Hearing and external ear  normal.     Left Ear: Hearing and external ear normal.  Eyes:     General: Lids are normal.        Right eye: No discharge.        Left eye: No discharge.     Conjunctiva/sclera: Conjunctivae normal.     Pupils: Pupils are equal, round, and reactive to light.  Neck:     Thyroid : No thyromegaly.     Vascular: No carotid bruit.  Cardiovascular:     Rate and Rhythm: Normal rate and regular rhythm.     Heart sounds: Normal heart sounds. No murmur heard.    No gallop.  Pulmonary:     Effort: Pulmonary effort is normal. No accessory muscle usage or respiratory distress.     Breath sounds: Normal breath sounds.  Abdominal:     General: Bowel sounds are normal. There is no distension.     Palpations: Abdomen is soft.     Tenderness: There is no abdominal tenderness.  Musculoskeletal:     Cervical back: Normal range of motion and neck supple.     Right lower leg: No edema.     Left lower leg: No edema.  Lymphadenopathy:     Cervical: No cervical adenopathy.  Skin:    General: Skin is warm and dry.  Neurological:     Mental Status: She is alert and oriented to person, place, and time.     Deep Tendon Reflexes: Reflexes are normal and symmetric.     Reflex Scores:      Brachioradialis reflexes are 2+ on the right side and 2+ on the left side.      Patellar reflexes are 2+ on the right side and 2+ on the left side. Psychiatric:        Attention and Perception: Attention normal.        Mood and Affect: Mood normal.        Speech: Speech normal.        Behavior: Behavior normal. Behavior is cooperative.        Thought Content: Thought content normal.     Results for orders placed or performed in visit on 01/16/24  Bayer DCA Hb A1c Waived   Collection Time: 01/16/24  1:21 PM  Result Value Ref Range   HB A1C (BAYER DCA - WAIVED) 5.9 (H) 4.8 - 5.6 %  Microalbumin, Urine Waived   Collection Time: 01/16/24  1:21 PM  Result Value Ref Range   Microalb, Ur Waived 10 0 - 19 mg/L    Creatinine, Urine Waived 50 10 - 300 mg/dL   Microalb/Creat Ratio 30-300 (H) <30 mg/g      Assessment & Plan:   Problem List Items Addressed This Visit       Cardiovascular and Mediastinum   SVT (supraventricular tachycardia) (HCC) (Chronic)   Stable.  Followed by cardiology, continue this collaboration.  Tried taking Metoprolol , reports she never could tolerate.      Essential hypertension (Chronic)   Chronic,  ongoing.  BP at goal in office today on recheck.  Is working on cutting back on sodium. Is taking medications more consistently.  Recommend ensuring medication taken daily + checking BP at home at least a few mornings a week.  Continue collaboration with cardiology and review notes.  Continue current medication regimen at this time, suspect adjustments will need to be made in future if any elevations in BP.  Labs: CMP, CBC. Continue to collaborate with cardiology.      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  Continue current medication regimen and collaboration with cardiology.  No recent NTG use, will send in refill as needed.  She has used Diltiazem  once or twice for RUQ pain that radiates into epigastric area and right chest.  Reports this helps.      Relevant Orders   Comprehensive metabolic panel with GFR   Lipid Panel w/o Chol/HDL Ratio   (HFpEF) heart failure with preserved ejection fraction (HCC) - Primary (Chronic)   Chronic, ongoing.  Euvolemic on exam.  Continue current medication regimen and collaboration with cardiology.  Discussed diet choices and to cut back on sodium intake, she culturally eats a higher sodium diet with lots of spicier foods.   - Reminded to call for an overnight weight gain of >2 pounds or a weekly weight weight of >5 pounds - not adding salt to his food and has been reading food labels. Reviewed the importance of  keeping daily sodium intake to 2000mg  daily  - No Ibuprofen  products      Relevant Orders   CBC with Differential/Platelet   Comprehensive metabolic panel with GFR   Ascending aorta dilation (HCC)   Ongoing, continue collaboration with cardiology. Recent noted reviewed.      Relevant Orders   Comprehensive metabolic panel with GFR   Lipid Panel w/o Chol/HDL Ratio   Aortic atherosclerosis (HCC)   Chronic, ongoing.  Noted on CXR 04/16/21. Check lipid panel today.  Continue preventative statin and ASA.      Relevant Orders   Comprehensive metabolic panel with GFR   Lipid Panel w/o Chol/HDL Ratio     Other   Hyperlipidemia (Chronic)   Chronic, ongoing.  Continue current medication regimen and adjust as needed.  Lipid panel today.  Have recommended she take medications daily to help with prevention.  Discussed this at length with patient.        Relevant Orders   Comprehensive metabolic panel with GFR   Lipid Panel w/o Chol/HDL Ratio   Noncompliance with medication regimen   Intermittent issues, recommend she bring all medications to all provider visits so we can review.      Morbid obesity (HCC)   BMI 41.92 with HTN, OLD MI, HLD. Recommended eating smaller high protein, low fat meals more frequently and exercising 30 mins a day 5 times a week with a goal of 10-15lb weight loss in the next 3 months. Patient voiced their understanding and motivation to adhere to these recommendations.       Latent tuberculosis infection   History of positive testing and current positive testing.  She refuses any form of treatment.  Agrees to [employer] 5 year CXR.  Next due 07/05/2027.  Health department aware and have talked to patient.      Relevant Orders   CBC with Differential/Platelet   Elevated hemoglobin A1c   A1c 5.9%today  which is trend down, continue Losartan  for kidney protection and recommend patient focus heavily on diet changes and regular activity. Urine ALB 07 Jan 2024.       Relevant Orders   Bayer DCA Hb A1c Waived (Completed)   Microalbumin, Urine Waived (Completed)   Comprehensive metabolic panel with GFR     Follow up plan: Return in about 6 months (around 07/18/2024) for HTN/HLD, Prediabetes, HF, GERD.

## 2024-01-17 ENCOUNTER — Ambulatory Visit: Payer: Self-pay | Admitting: Nurse Practitioner

## 2024-01-17 LAB — CBC WITH DIFFERENTIAL/PLATELET
Basophils Absolute: 0 10*3/uL (ref 0.0–0.2)
Basos: 1 %
EOS (ABSOLUTE): 0.2 10*3/uL (ref 0.0–0.4)
Eos: 3 %
Hematocrit: 43.3 % (ref 34.0–46.6)
Hemoglobin: 13.9 g/dL (ref 11.1–15.9)
Immature Grans (Abs): 0 10*3/uL (ref 0.0–0.1)
Immature Granulocytes: 0 %
Lymphocytes Absolute: 2.6 10*3/uL (ref 0.7–3.1)
Lymphs: 45 %
MCH: 26.3 pg — ABNORMAL LOW (ref 26.6–33.0)
MCHC: 32.1 g/dL (ref 31.5–35.7)
MCV: 82 fL (ref 79–97)
Monocytes Absolute: 0.4 10*3/uL (ref 0.1–0.9)
Monocytes: 6 %
Neutrophils Absolute: 2.5 10*3/uL (ref 1.4–7.0)
Neutrophils: 45 %
Platelets: 220 10*3/uL (ref 150–450)
RBC: 5.29 x10E6/uL — ABNORMAL HIGH (ref 3.77–5.28)
RDW: 14.9 % (ref 11.7–15.4)
WBC: 5.7 10*3/uL (ref 3.4–10.8)

## 2024-01-17 LAB — COMPREHENSIVE METABOLIC PANEL WITH GFR
ALT: 16 IU/L (ref 0–32)
AST: 25 IU/L (ref 0–40)
Albumin: 4 g/dL (ref 3.8–4.8)
Alkaline Phosphatase: 68 IU/L (ref 44–121)
BUN/Creatinine Ratio: 14 (ref 12–28)
BUN: 11 mg/dL (ref 8–27)
Bilirubin Total: 0.7 mg/dL (ref 0.0–1.2)
CO2: 22 mmol/L (ref 20–29)
Calcium: 9.9 mg/dL (ref 8.7–10.3)
Chloride: 96 mmol/L (ref 96–106)
Creatinine, Ser: 0.77 mg/dL (ref 0.57–1.00)
Globulin, Total: 3.7 g/dL (ref 1.5–4.5)
Glucose: 101 mg/dL — ABNORMAL HIGH (ref 70–99)
Potassium: 3.5 mmol/L (ref 3.5–5.2)
Sodium: 136 mmol/L (ref 134–144)
Total Protein: 7.7 g/dL (ref 6.0–8.5)
eGFR: 80 mL/min/{1.73_m2} (ref >59)

## 2024-01-17 LAB — LIPID PANEL W/O CHOL/HDL RATIO
Cholesterol, Total: 151 mg/dL (ref 100–199)
HDL: 33 mg/dL — ABNORMAL LOW (ref >39)
LDL Chol Calc (NIH): 96 mg/dL (ref 0–99)
Triglycerides: 120 mg/dL (ref 0–149)
VLDL Cholesterol Cal: 22 mg/dL (ref 5–40)

## 2024-01-17 NOTE — Progress Notes (Signed)
 Please call Jennifer Grant and let her know labs have returned as she does not consistently check MyChart: - CBC shows no anemia or infection - Kidney and liver function are all normal. - Lipid panel shows LDL still a little above goal for heart and brain protection.  Please ensure you are taking Zetia  and Rosuvastatin  daily.  We may need to consider changing to injection in future if levels remain elevated.  Any questions? Keep being amazing!!  Thank you for allowing me to participate in your care.  I appreciate you. Kindest regards, Shakala Marlatt

## 2024-02-13 ENCOUNTER — Other Ambulatory Visit: Payer: Self-pay | Admitting: Nurse Practitioner

## 2024-02-13 DIAGNOSIS — I2510 Atherosclerotic heart disease of native coronary artery without angina pectoris: Secondary | ICD-10-CM

## 2024-02-15 NOTE — Telephone Encounter (Signed)
 Requested Prescriptions  Pending Prescriptions Disp Refills   diltiazem  (CARDIZEM ) 30 MG tablet [Pharmacy Med Name: dilTIAZem  HCl 30 MG Oral Tablet] 90 tablet 0    Sig: TAKE 1 TABLET BY MOUTH AS NEEDED     Cardiovascular: Calcium  Channel Blockers 3 Passed - 02/15/2024  4:35 PM      Passed - ALT in normal range and within 360 days    ALT  Date Value Ref Range Status  01/16/2024 16 0 - 32 IU/L Final   ALT (SGPT) Piccolo, Waived  Date Value Ref Range Status  12/28/2017 22 10 - 47 U/L Final         Passed - AST in normal range and within 360 days    AST  Date Value Ref Range Status  01/16/2024 25 0 - 40 IU/L Final   AST (SGOT) Piccolo, Waived  Date Value Ref Range Status  12/28/2017 24 11 - 38 U/L Final         Passed - Cr in normal range and within 360 days    Creat  Date Value Ref Range Status  07/23/2015 0.70 0.50 - 0.99 mg/dL Final   Creatinine, Ser  Date Value Ref Range Status  01/16/2024 0.77 0.57 - 1.00 mg/dL Final         Passed - Last BP in normal range    BP Readings from Last 1 Encounters:  01/16/24 136/80         Passed - Last Heart Rate in normal range    Pulse Readings from Last 1 Encounters:  01/16/24 82         Passed - Valid encounter within last 6 months    Recent Outpatient Visits           1 month ago Chronic heart failure with preserved ejection fraction (HCC)   [place] [facility] [place], Sanjuan Crumbly T, NP   3 months ago Generalized abdominal pain   [place] [facility] [place], Sanjuan Crumbly T, NP       Future Appointments             In 1 week Wenona Hamilton, MD [facility]             nitroGLYCERIN  (NITROSTAT ) 0.4 MG SL tablet [Pharmacy Med Name: Nitroglycerin  0.4 MG Sublingual Tablet Sublingual] 25 tablet 0    Sig: DISSOLVE ONE TABLET UNDER THE TONGUE EVERY 5 MINUTES AS NEEDED FOR CHEST PAIN.  DO NOT EXCEED A TOTAL OF 3 DOSES IN 15 MINUTES     Cardiovascular:  Nitrates Passed -  02/15/2024  4:35 PM      Passed - Last BP in normal range    BP Readings from Last 1 Encounters:  01/16/24 136/80         Passed - Last Heart Rate in normal range    Pulse Readings from Last 1 Encounters:  01/16/24 82         Passed - Valid encounter within last 12 months    Recent Outpatient Visits           1 month ago Chronic heart failure with preserved ejection fraction (HCC)   [place] [facility] [place], Sanjuan Crumbly T, NP   3 months ago Generalized abdominal pain   [place] [facility] [place], Lavelle Posey, NP       Future Appointments             In 1 week Wenona Hamilton,  MD [facility]

## 2024-02-17 ENCOUNTER — Emergency Department

## 2024-02-17 ENCOUNTER — Telehealth: Payer: Self-pay | Admitting: Cardiovascular Disease

## 2024-02-17 ENCOUNTER — Observation Stay
Admission: EM | Admit: 2024-02-17 | Discharge: 2024-02-18 | Disposition: A | Discharge status: Home / Self Care | Attending: Obstetrics and Gynecology | Admitting: Obstetrics and Gynecology

## 2024-02-17 ENCOUNTER — Other Ambulatory Visit: Payer: Self-pay

## 2024-02-17 DIAGNOSIS — I2510 Atherosclerotic heart disease of native coronary artery without angina pectoris: Secondary | ICD-10-CM

## 2024-02-17 DIAGNOSIS — Z8679 Personal history of other diseases of the circulatory system: Secondary | ICD-10-CM | POA: Insufficient documentation

## 2024-02-17 DIAGNOSIS — Z79899 Other long term (current) drug therapy: Secondary | ICD-10-CM | POA: Diagnosis not present

## 2024-02-17 DIAGNOSIS — Z7901 Long term (current) use of anticoagulants: Secondary | ICD-10-CM | POA: Diagnosis not present

## 2024-02-17 DIAGNOSIS — S299XXA Unspecified injury of thorax, initial encounter: Secondary | ICD-10-CM | POA: Diagnosis not present

## 2024-02-17 DIAGNOSIS — R6889 Other general symptoms and signs: Secondary | ICD-10-CM | POA: Diagnosis not present

## 2024-02-17 DIAGNOSIS — Z87898 Personal history of other specified conditions: Secondary | ICD-10-CM

## 2024-02-17 DIAGNOSIS — K5730 Diverticulosis of large intestine without perforation or abscess without bleeding: Secondary | ICD-10-CM | POA: Insufficient documentation

## 2024-02-17 DIAGNOSIS — R079 Chest pain, unspecified: Secondary | ICD-10-CM | POA: Diagnosis not present

## 2024-02-17 DIAGNOSIS — E876 Hypokalemia: Secondary | ICD-10-CM

## 2024-02-17 DIAGNOSIS — E785 Hyperlipidemia, unspecified: Secondary | ICD-10-CM | POA: Diagnosis not present

## 2024-02-17 DIAGNOSIS — E66813 Obesity, class 3: Secondary | ICD-10-CM | POA: Diagnosis present

## 2024-02-17 DIAGNOSIS — I700 Atherosclerosis of aorta: Secondary | ICD-10-CM | POA: Diagnosis not present

## 2024-02-17 DIAGNOSIS — I5030 Unspecified diastolic (congestive) heart failure: Secondary | ICD-10-CM | POA: Diagnosis present

## 2024-02-17 DIAGNOSIS — Z87891 Personal history of nicotine dependence: Secondary | ICD-10-CM | POA: Insufficient documentation

## 2024-02-17 DIAGNOSIS — Z91040 Latex allergy status: Secondary | ICD-10-CM | POA: Insufficient documentation

## 2024-02-17 DIAGNOSIS — Z9861 Coronary angioplasty status: Secondary | ICD-10-CM | POA: Diagnosis not present

## 2024-02-17 DIAGNOSIS — S3993XA Unspecified injury of pelvis, initial encounter: Secondary | ICD-10-CM | POA: Diagnosis not present

## 2024-02-17 DIAGNOSIS — R0789 Other chest pain: Secondary | ICD-10-CM | POA: Diagnosis not present

## 2024-02-17 DIAGNOSIS — Z6841 Body Mass Index (BMI) 40.0 and over, adult: Secondary | ICD-10-CM | POA: Insufficient documentation

## 2024-02-17 DIAGNOSIS — R519 Headache, unspecified: Secondary | ICD-10-CM | POA: Diagnosis present

## 2024-02-17 DIAGNOSIS — I110 Hypertensive heart disease with heart failure: Secondary | ICD-10-CM | POA: Diagnosis not present

## 2024-02-17 DIAGNOSIS — I5032 Chronic diastolic (congestive) heart failure: Secondary | ICD-10-CM | POA: Insufficient documentation

## 2024-02-17 DIAGNOSIS — I25118 Atherosclerotic heart disease of native coronary artery with other forms of angina pectoris: Secondary | ICD-10-CM

## 2024-02-17 DIAGNOSIS — I10 Essential (primary) hypertension: Secondary | ICD-10-CM | POA: Diagnosis present

## 2024-02-17 DIAGNOSIS — G4733 Obstructive sleep apnea (adult) (pediatric): Secondary | ICD-10-CM | POA: Insufficient documentation

## 2024-02-17 DIAGNOSIS — S20211A Contusion of right front wall of thorax, initial encounter: Secondary | ICD-10-CM | POA: Diagnosis not present

## 2024-02-17 DIAGNOSIS — R7989 Other specified abnormal findings of blood chemistry: Secondary | ICD-10-CM | POA: Diagnosis not present

## 2024-02-17 DIAGNOSIS — S3991XA Unspecified injury of abdomen, initial encounter: Secondary | ICD-10-CM | POA: Diagnosis not present

## 2024-02-17 DIAGNOSIS — I499 Cardiac arrhythmia, unspecified: Secondary | ICD-10-CM | POA: Diagnosis not present

## 2024-02-17 DIAGNOSIS — Z743 Need for continuous supervision: Secondary | ICD-10-CM | POA: Diagnosis not present

## 2024-02-17 LAB — BASIC METABOLIC PANEL WITH GFR
Anion gap: 12 (ref 5–15)
BUN: 15 mg/dL (ref 8–23)
CO2: 25 mmol/L (ref 22–32)
Calcium: 10.4 mg/dL — ABNORMAL HIGH (ref 8.9–10.3)
Chloride: 99 mmol/L (ref 98–111)
Creatinine, Ser: 0.67 mg/dL (ref 0.44–1.00)
GFR, Estimated: >60 mL/min (ref >60)
Glucose, Bld: 116 mg/dL — ABNORMAL HIGH (ref 70–99)
Potassium: 3.1 mmol/L — ABNORMAL LOW (ref 3.5–5.1)
Sodium: 136 mmol/L (ref 135–145)

## 2024-02-17 LAB — CBC
HCT: 42.3 % (ref 36.0–46.0)
Hemoglobin: 13.5 g/dL (ref 12.0–15.0)
MCH: 25.8 pg — ABNORMAL LOW (ref 26.0–34.0)
MCHC: 31.9 g/dL (ref 30.0–36.0)
MCV: 80.7 fL (ref 80.0–100.0)
Platelets: 214 10*3/uL (ref 150–400)
RBC: 5.24 MIL/uL — ABNORMAL HIGH (ref 3.87–5.11)
RDW: 15.3 % (ref 11.5–15.5)
WBC: 7.1 10*3/uL (ref 4.0–10.5)
nRBC: 0 % (ref 0.0–0.2)

## 2024-02-17 LAB — TROPONIN I (HIGH SENSITIVITY)
Troponin I (High Sensitivity): 63 ng/L — ABNORMAL HIGH (ref <18)
Troponin I (High Sensitivity): 73 ng/L — ABNORMAL HIGH (ref <18)

## 2024-02-17 MED ORDER — POTASSIUM CHLORIDE CRYS ER 20 MEQ PO TBCR
30.0000 meq | EXTENDED_RELEASE_TABLET | Freq: Once | ORAL | Status: AC
  Administered 2024-02-17: 30 meq via ORAL
  Filled 2024-02-17: qty 2

## 2024-02-17 MED ORDER — IOHEXOL 350 MG/ML SOLN
100.0000 mL | Freq: Once | INTRAVENOUS | Status: AC | PRN
  Administered 2024-02-17: 100 mL via INTRAVENOUS

## 2024-02-17 MED ORDER — LORATADINE 10 MG PO TABS
10.0000 mg | ORAL_TABLET | Freq: Once | ORAL | Status: AC
  Administered 2024-02-17: 10 mg via ORAL
  Filled 2024-02-17: qty 1

## 2024-02-17 NOTE — ED Notes (Signed)
 Patient transported to CT
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
## 2024-02-18 ENCOUNTER — Observation Stay (HOSPITAL_BASED_OUTPATIENT_CLINIC_OR_DEPARTMENT_OTHER)
Admit: 2024-02-18 | Discharge: 2024-02-18 | Disposition: A | Discharge status: Home / Self Care | Attending: Obstetrics and Gynecology | Admitting: Obstetrics and Gynecology

## 2024-02-18 ENCOUNTER — Observation Stay

## 2024-02-18 DIAGNOSIS — S20211A Contusion of right front wall of thorax, initial encounter: Secondary | ICD-10-CM | POA: Insufficient documentation

## 2024-02-18 DIAGNOSIS — Z87898 Personal history of other specified conditions: Secondary | ICD-10-CM

## 2024-02-18 DIAGNOSIS — R079 Chest pain, unspecified: Secondary | ICD-10-CM

## 2024-02-18 DIAGNOSIS — R0789 Other chest pain: Secondary | ICD-10-CM

## 2024-02-18 DIAGNOSIS — R519 Headache, unspecified: Secondary | ICD-10-CM | POA: Diagnosis not present

## 2024-02-18 DIAGNOSIS — R7989 Other specified abnormal findings of blood chemistry: Secondary | ICD-10-CM

## 2024-02-18 DIAGNOSIS — I6523 Occlusion and stenosis of bilateral carotid arteries: Secondary | ICD-10-CM | POA: Diagnosis not present

## 2024-02-18 DIAGNOSIS — G4733 Obstructive sleep apnea (adult) (pediatric): Secondary | ICD-10-CM | POA: Insufficient documentation

## 2024-02-18 LAB — ECHOCARDIOGRAM COMPLETE
AR max vel: 1.35 cm2
AV Area VTI: 1.66 cm2
AV Area mean vel: 1.46 cm2
AV Mean grad: 7.5 mmHg
AV Peak grad: 16.6 mmHg
Ao pk vel: 2.04 m/s
Area-P 1/2: 2.22 cm2
Calc EF: 50.2 %
Height: 63 in
MV VTI: 1.48 cm2
P 1/2 time: 518 ms
S' Lateral: 3.4 cm
Single Plane A2C EF: 52.1 %
Single Plane A4C EF: 49.8 %
Weight: 4000 [oz_av]

## 2024-02-18 LAB — TROPONIN I (HIGH SENSITIVITY): Troponin I (High Sensitivity): 32 ng/L — ABNORMAL HIGH (ref <18)

## 2024-02-18 MED ORDER — ONDANSETRON HCL 4 MG/2ML IJ SOLN: 4.0000 mg | Freq: Four times a day (QID) | INTRAMUSCULAR | Status: DC | PRN

## 2024-02-18 MED ORDER — DILTIAZEM HCL 30 MG PO TABS: 30.0000 mg | ORAL_TABLET | ORAL | Status: DC | PRN

## 2024-02-18 MED ORDER — ONDANSETRON HCL 4 MG PO TABS
4.0000 mg | ORAL_TABLET | Freq: Four times a day (QID) | ORAL | Status: DC | PRN
Start: 2024-02-18 — End: 2024-02-19

## 2024-02-18 MED ORDER — ASPIRIN 81 MG PO TBEC: 81.0000 mg | DELAYED_RELEASE_TABLET | Freq: Every day | ORAL | Status: DC

## 2024-02-18 MED ORDER — AMLODIPINE BESYLATE 10 MG PO TABS
10.0000 mg | ORAL_TABLET | Freq: Every day | ORAL | Status: DC
  Administered 2024-02-18: 10 mg via ORAL
  Filled 2024-02-18: qty 2

## 2024-02-18 MED ORDER — KETOROLAC TROMETHAMINE 30 MG/ML IJ SOLN
30.0000 mg | Freq: Four times a day (QID) | INTRAMUSCULAR | Status: DC | PRN
Start: 2024-02-18 — End: 2024-02-23

## 2024-02-18 MED ORDER — PERFLUTREN LIPID MICROSPHERE
1.0000 mL | INTRAVENOUS | Status: AC | PRN
  Administered 2024-02-18: 2 mL via INTRAVENOUS

## 2024-02-18 MED ORDER — LOSARTAN POTASSIUM 50 MG PO TABS
100.0000 mg | ORAL_TABLET | Freq: Every day | ORAL | Status: DC
  Administered 2024-02-18: 100 mg via ORAL
  Filled 2024-02-18: qty 2

## 2024-02-18 MED ORDER — MORPHINE SULFATE (PF) 2 MG/ML IV SOLN: 2.0000 mg | INTRAVENOUS | Status: DC | PRN

## 2024-02-18 MED ORDER — ACETAMINOPHEN 325 MG PO TABS
650.0000 mg | ORAL_TABLET | Freq: Four times a day (QID) | ORAL | Status: DC | PRN
Start: 2024-02-18 — End: 2024-02-19

## 2024-02-18 MED ORDER — NITROGLYCERIN 0.4 MG SL SUBL: 0.4000 mg | SUBLINGUAL_TABLET | SUBLINGUAL | Status: DC | PRN

## 2024-02-18 MED ORDER — ACETAMINOPHEN 650 MG RE SUPP: 650.0000 mg | Freq: Four times a day (QID) | RECTAL | Status: DC | PRN

## 2024-02-18 MED ORDER — ISOSORBIDE MONONITRATE ER 60 MG PO TB24
60.0000 mg | ORAL_TABLET | Freq: Every day | ORAL | Status: DC
  Administered 2024-02-18: 60 mg via ORAL
  Filled 2024-02-18: qty 1

## 2024-02-18 MED ORDER — HYDROCODONE-ACETAMINOPHEN 5-325 MG PO TABS: 1.0000 | ORAL_TABLET | ORAL | Status: DC | PRN

## 2024-02-18 MED ORDER — CLOPIDOGREL BISULFATE 75 MG PO TABS: 75.0000 mg | ORAL_TABLET | Freq: Every day | ORAL | Status: DC

## 2024-02-18 MED ORDER — ROSUVASTATIN CALCIUM 10 MG PO TABS
40.0000 mg | ORAL_TABLET | Freq: Every day | ORAL | Status: DC
  Administered 2024-02-18: 40 mg via ORAL
  Filled 2024-02-18 (×2): qty 2

## 2024-02-18 NOTE — Assessment & Plan Note (Signed)
 SVT on prior ZIO Continue diltiazem  as needed

## 2024-02-18 NOTE — Plan of Care (Signed)
 IV removed, discharge instructions reviewed and patient to be discharged to home.  Waiting for ride.

## 2024-02-18 NOTE — Progress Notes (Signed)
*  PRELIMINARY RESULTS* Echocardiogram 2D Echocardiogram has been performed.  Bari BROCKS Trenae Brunke 02/18/2024, 10:56 AM
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
## 2024-02-18 NOTE — ED Notes (Signed)
 Cleatus MD aware HR 43-50 while resting comfortably.
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
## 2024-02-18 NOTE — Assessment & Plan Note (Signed)
 CPAP nightly
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
## 2024-02-18 NOTE — Assessment & Plan Note (Signed)
 Complicating factor to overall prognosis and care

## 2024-02-28 ENCOUNTER — Encounter: Payer: Self-pay | Admitting: Cardiovascular Disease

## 2024-02-28 ENCOUNTER — Ambulatory Visit: Attending: Cardiovascular Disease | Admitting: Cardiovascular Disease

## 2024-02-28 VITALS — BP 150/90 | HR 71 | Ht 63.0 in | Wt 227.4 lb

## 2024-02-28 DIAGNOSIS — R079 Chest pain, unspecified: Secondary | ICD-10-CM

## 2024-02-28 DIAGNOSIS — I25118 Atherosclerotic heart disease of native coronary artery with other forms of angina pectoris: Secondary | ICD-10-CM | POA: Diagnosis not present

## 2024-02-28 DIAGNOSIS — I10 Essential (primary) hypertension: Secondary | ICD-10-CM

## 2024-02-28 DIAGNOSIS — R002 Palpitations: Secondary | ICD-10-CM | POA: Diagnosis not present

## 2024-02-28 DIAGNOSIS — I5032 Chronic diastolic (congestive) heart failure: Secondary | ICD-10-CM | POA: Diagnosis not present

## 2024-02-28 DIAGNOSIS — E785 Hyperlipidemia, unspecified: Secondary | ICD-10-CM | POA: Diagnosis not present

## 2024-02-28 DIAGNOSIS — I77810 Thoracic aortic ectasia: Secondary | ICD-10-CM | POA: Diagnosis not present

## 2024-02-28 MED ORDER — NITROGLYCERIN 0.4 MG SL SUBL: SUBLINGUAL_TABLET | SUBLINGUAL | 0 refills | Status: AC

## 2024-02-28 NOTE — Patient Instructions (Signed)
 Medication Instructions:  No changes *If you need a refill on your cardiac medications before your next appointment, please call your pharmacy*  Lab Work: None ordered If you have labs (blood work) drawn today and your tests are completely normal, you will receive your results only by: MyChart Message (if you have MyChart) OR A paper copy in the mail If you have any lab test that is abnormal or we need to change your treatment, we will call you to review the results.  Testing/Procedures: CARDIAC PET- Your physician has requested that you have a Cardiac Pet Stress Test.   This testing is completed at [facility] ([address] [place], [place] [address]) or [facility] ([address], [place], [place]). Please arrive 30 minutes prior to your scheduled time.  The schedulers will call you to get this scheduled. Please follow further testing instructions below.   Follow-Up: At [facility], you and your health needs are our priority.  As part of our continuing mission to provide you with exceptional heart care, our providers are all part of one team.  This team includes your primary Cardiologist (physician) and Advanced Practice Providers or APPs (Physician Assistants and Nurse Practitioners) who all work together to provide you with the care you need, when you need it.  Your next appointment:   6 month(s)  Provider:   You may see Deatrice Cage, MD or one of the following Advanced Practice Providers on your designated Care Team:   Lonni Meager, NP Lesley Maffucci, PA-C Bernardino Bring, PA-C Cadence [place], PA-C Tylene Lunch, NP Barnie Hila, NP    We recommend signing up for the patient portal called MyChart.  Sign up information is provided on this After Visit Summary.  MyChart is used to connect with patients for Virtual Visits (Telemedicine).  Patients are able to view lab/test results, encounter notes, upcoming  appointments, etc.  Non-urgent messages can be sent to your provider as well.   To learn more about what you can do with MyChart, go to [email or web address].   Other Instructions    Please report to Radiology at the [facility] Main Entrance 30 minutes early for your test.  [address] [place], [place] [address]                         OR   Please report to Radiology at [facility] Main Entrance, medical mall, 30 mins prior to your test.  [address]  [place], [place]  How to Prepare for Your Cardiac PET/CT Stress Test:  Nothing to eat or drink, except water, 3 hours prior to arrival time.  NO caffeine/decaffeinated products, or chocolate 12 hours prior to arrival. (Please note decaffeinated beverages (teas/coffees) still contain caffeine).  If you have caffeine within 12 hours prior, the test will need to be rescheduled.  Medication instructions: Do not take nitrates (isosorbide  mononitrate, Ranexa) the day before or day of test  You may take your remaining medications with water.  NO perfume, cologne or lotion on chest or abdomen area. FEMALES - Please avoid wearing dresses to this appointment.  Total time is 1 to 2 hours; you may want to bring reading material for the waiting time.  IF YOU THINK YOU MAY BE PREGNANT, OR ARE NURSING PLEASE INFORM THE TECHNOLOGIST.  In preparation for your appointment, medication and supplies will be purchased.  Appointment availability is limited, so if you  need to cancel or reschedule, please call the Radiology Department Scheduler at [phone] 24 hours in advance to avoid a cancellation fee of $100.00  What to Expect When you Arrive:  Once you arrive and check in for your appointment, you will be taken to a preparation room within the Radiology Department.  A technologist or Nurse will obtain your medical history, verify that you are correctly prepped for the exam, and explain the procedure.   Afterwards, an IV will be started in your arm and electrodes will be placed on your skin for EKG monitoring during the stress portion of the exam. Then you will be escorted to the PET/CT scanner.  There, staff will get you positioned on the scanner and obtain a blood pressure and EKG.  During the exam, you will continue to be connected to the EKG and blood pressure machines.  A small, safe amount of a radioactive tracer will be injected in your IV to obtain a series of pictures of your heart along with an injection of a stress agent.    After your Exam:  It is recommended that you eat a meal and drink a caffeinated beverage to counter act any effects of the stress agent.  Drink plenty of fluids for the remainder of the day and urinate frequently for the first couple of hours after the exam.  Your doctor will inform you of your test results within 7-10 business days.  For more information and frequently asked questions, please visit our website: [email or web address]  For questions about your test or how to prepare for your test, please call: Cardiac Imaging Nurse Navigators Office: [phone]
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
## 2024-03-07 ENCOUNTER — Encounter (HOSPITAL_COMMUNITY): Payer: Self-pay

## 2024-03-08 ENCOUNTER — Ambulatory Visit
Admission: RE | Admit: 2024-03-08 | Discharge: 2024-03-08 | Disposition: A | Discharge status: Home / Self Care | Source: Ambulatory Visit | Attending: Cardiovascular Disease | Admitting: Cardiovascular Disease

## 2024-03-08 DIAGNOSIS — R079 Chest pain, unspecified: Secondary | ICD-10-CM

## 2024-03-20 ENCOUNTER — Ambulatory Visit: Payer: Self-pay

## 2024-03-20 ENCOUNTER — Emergency Department

## 2024-03-20 ENCOUNTER — Encounter: Payer: Self-pay | Admitting: Emergency Medicine

## 2024-03-20 ENCOUNTER — Other Ambulatory Visit: Payer: Self-pay

## 2024-03-20 ENCOUNTER — Emergency Department
Admission: EM | Admit: 2024-03-20 | Discharge: 2024-03-20 | Disposition: A | Discharge status: Home / Self Care | Attending: Emergency Medicine | Admitting: Emergency Medicine

## 2024-03-20 DIAGNOSIS — R079 Chest pain, unspecified: Secondary | ICD-10-CM | POA: Diagnosis not present

## 2024-03-20 DIAGNOSIS — I771 Stricture of artery: Secondary | ICD-10-CM | POA: Diagnosis not present

## 2024-03-20 DIAGNOSIS — I700 Atherosclerosis of aorta: Secondary | ICD-10-CM | POA: Diagnosis not present

## 2024-03-20 DIAGNOSIS — K2100 Gastro-esophageal reflux disease with esophagitis, without bleeding: Secondary | ICD-10-CM | POA: Diagnosis not present

## 2024-03-20 DIAGNOSIS — I2510 Atherosclerotic heart disease of native coronary artery without angina pectoris: Secondary | ICD-10-CM | POA: Insufficient documentation

## 2024-03-20 DIAGNOSIS — I509 Heart failure, unspecified: Secondary | ICD-10-CM | POA: Diagnosis not present

## 2024-03-20 DIAGNOSIS — Z7901 Long term (current) use of anticoagulants: Secondary | ICD-10-CM | POA: Diagnosis not present

## 2024-03-20 DIAGNOSIS — E876 Hypokalemia: Secondary | ICD-10-CM | POA: Diagnosis not present

## 2024-03-20 DIAGNOSIS — R0789 Other chest pain: Secondary | ICD-10-CM

## 2024-03-20 LAB — CBC
HCT: 45.4 % (ref 36.0–46.0)
Hemoglobin: 13.6 g/dL (ref 12.0–15.0)
MCH: 25.6 pg — ABNORMAL LOW (ref 26.0–34.0)
MCHC: 30 g/dL (ref 30.0–36.0)
MCV: 85.3 fL (ref 80.0–100.0)
Platelets: 209 K/uL (ref 150–400)
RBC: 5.32 MIL/uL — ABNORMAL HIGH (ref 3.87–5.11)
RDW: 15.9 % — ABNORMAL HIGH (ref 11.5–15.5)
WBC: 6 K/uL (ref 4.0–10.5)
nRBC: 0 % (ref 0.0–0.2)

## 2024-03-20 LAB — BASIC METABOLIC PANEL WITH GFR
Anion gap: 11 (ref 5–15)
BUN: 14 mg/dL (ref 8–23)
CO2: 25 mmol/L (ref 22–32)
Calcium: 9.9 mg/dL (ref 8.9–10.3)
Chloride: 100 mmol/L (ref 98–111)
Creatinine, Ser: 0.73 mg/dL (ref 0.44–1.00)
GFR, Estimated: >60 mL/min (ref >60)
Glucose, Bld: 120 mg/dL — ABNORMAL HIGH (ref 70–99)
Potassium: 3.3 mmol/L — ABNORMAL LOW (ref 3.5–5.1)
Sodium: 136 mmol/L (ref 135–145)

## 2024-03-20 LAB — TROPONIN I (HIGH SENSITIVITY)
Troponin I (High Sensitivity): 18 ng/L — ABNORMAL HIGH (ref <18)
Troponin I (High Sensitivity): 19 ng/L — ABNORMAL HIGH (ref <18)

## 2024-03-20 MED ORDER — POTASSIUM CHLORIDE CRYS ER 20 MEQ PO TBCR
40.0000 meq | EXTENDED_RELEASE_TABLET | Freq: Once | ORAL | Status: AC
  Administered 2024-03-20: 40 meq via ORAL
  Filled 2024-03-20: qty 2

## 2024-03-20 MED ORDER — PANTOPRAZOLE SODIUM 40 MG PO TBEC: 40.0000 mg | DELAYED_RELEASE_TABLET | Freq: Every day | ORAL | 0 refills | Status: DC

## 2024-03-20 NOTE — ED Triage Notes (Signed)
 Patient to ED via POV for chest pain. PT reports it started in right arm and radiates across her chest. Hx of stent placement.

## 2024-03-20 NOTE — Telephone Encounter (Signed)
Patient currently at ED for evaluation

## 2024-03-20 NOTE — Telephone Encounter (Signed)
 Will keep an eye on patient's chart and see if she presents to the ER as advised. If not, will call and follow up with patient.

## 2024-03-20 NOTE — ED Provider Notes (Signed)
 [facility] Provider Note    Event Date/Time   First MD Initiated Contact with Patient 03/20/24 1720     (approximate)   History   Chief Complaint Chest Pain   HPI  Jennifer Grant is a 73 y.o. female with past medical history of hyperlipidemia, CAD, CHF, and atrial fibrillation who presents to the ED complaining of chest pain.  Patient reports that she has been dealing with intermittent burning discomfort in the center of her chest and radiating towards her right arm for the bout the past week.  Patient states that she will frequently feel bloated to the point that she feels like she needs to belch, but pain does not seem to be brought on by anything specific.  She denies any fevers, cough, or difficulty breathing.  She has not had any pain or swelling in her legs.  She denies any fevers, cough, or shortness of breath.  She is not having any pain in her chest currently.     Physical Exam   Triage Vital Signs: ED Triage Vitals  Encounter Vitals Group     BP 03/20/24 1309 (!) 175/105     Girls Systolic BP Percentile --      Girls Diastolic BP Percentile --      Boys Systolic BP Percentile --      Boys Diastolic BP Percentile --      Pulse Rate 03/20/24 1309 82     Resp 03/20/24 1309 17     Temp 03/20/24 1309 98.4 F (36.9 C)     Temp Source 03/20/24 1309 Oral     SpO2 03/20/24 1309 97 %     Weight 03/20/24 1306 225 lb (102.1 kg)     Height 03/20/24 1306 5' 3 (1.6 m)     Head Circumference --      Peak Flow --      Pain Score 03/20/24 1305 8     Pain Loc --      Pain Education --      Exclude from Growth Chart --     Most recent vital signs: Vitals:   03/20/24 1631 03/20/24 1727  BP: (!) 167/85 133/86  Pulse: 62 (!) 57  Resp: 17 14  Temp: 97.9 F (36.6 C)   SpO2: 95% 100%    Constitutional: Alert and oriented. Eyes: Conjunctivae are normal. Head: Atraumatic. Nose: No congestion/rhinnorhea. Mouth/Throat: Mucous membranes are moist.   Cardiovascular: Normal rate, regular rhythm. Grossly normal heart sounds.  2+ radial pulses bilaterally. Respiratory: Normal respiratory effort.  No retractions. Lungs CTAB. Gastrointestinal: Soft and nontender. No distention. Musculoskeletal: No lower extremity tenderness nor edema.  Neurologic:  Normal speech and language. No gross focal neurologic deficits are appreciated.    ED Results / Procedures / Treatments   Labs (all labs ordered are listed, but only abnormal results are displayed) Labs Reviewed  BASIC METABOLIC PANEL WITH GFR - Abnormal; Notable for the following components:      Result Value   Potassium 3.3 (*)    Glucose, Bld 120 (*)    All other components within normal limits  CBC - Abnormal; Notable for the following components:   RBC 5.32 (*)    MCH 25.6 (*)    RDW 15.9 (*)    All other components within normal limits  TROPONIN I (HIGH SENSITIVITY) - Abnormal; Notable for the following components:   Troponin I (High Sensitivity) 18 (*)    All other components within normal limits  TROPONIN I (HIGH SENSITIVITY) - Abnormal; Notable for the following components:   Troponin I (High Sensitivity) 19 (*)    All other components within normal limits     EKG  ED ECG REPORT I, Carlin Palin, the attending physician, personally viewed and interpreted this ECG.   Date: 03/20/2024  EKG Time: 13:08  Rate: 82  Rhythm: normal sinus rhythm  Axis: Normal  Intervals:none  ST&T Change: None  RADIOLOGY Chest x-ray reviewed and interpreted by me with no infiltrate, edema, or effusion.  PROCEDURES:  Critical Care performed: No  Procedures   MEDICATIONS ORDERED IN ED: Medications  potassium chloride  SA (KLOR-CON  M) CR tablet 40 mEq (has no administration in time range)     IMPRESSION / MDM / ASSESSMENT AND PLAN / ED COURSE  I reviewed the triage vital signs and the nursing notes.                              73 y.o. female with past medical history of  hyperlipidemia, CAD, CHF, and atrial fibrillation who presents to the ED complaining of intermittent burning discomfort in her chest for about the past week.  Patient's presentation is most consistent with acute presentation with potential threat to life or bodily function.  Differential diagnosis includes, but is not limited to, ACS, PE, pneumonia, pneumothorax, musculoskeletal pain, GERD, anxiety.  Patient nontoxic-appearing and in no acute distress, vital signs are unremarkable.  EKG shows no evidence of arrhythmia or ischemia, 2 sets of troponin are mildly elevated but stable and lower compared to previous baseline.  Remainder of labs are reassuring with no significant anemia, leukocytosis, electrolyte abnormality, or AKI.  Chest x-ray is also unremarkable.  Symptoms seem atypical for ACS, patient did report similar symptoms during July 1 visit to her cardiologist.  She has been scheduled for cardiac PET scan in less than 2 weeks, do not feel further workup needed at this time.  We will trial PPI for possible GERD and she was counseled to return to the ED for new or worsening symptoms.  Patient agrees with plan.      FINAL CLINICAL IMPRESSION(S) / ED DIAGNOSES   Final diagnoses:  Atypical chest pain  Gastroesophageal reflux disease with esophagitis without hemorrhage     Rx / DC Orders   ED Discharge Orders          Ordered    pantoprazole  (PROTONIX ) 40 MG tablet  Daily        03/20/24 1739             Note:  This document was prepared using Dragon voice recognition software and may include unintentional dictation errors.   Palin Carlin, MD 03/20/24 [phone]

## 2024-03-20 NOTE — Telephone Encounter (Signed)
 FYI Only or Action Required?: FYI only for provider.  Patient was last seen in primary care on 01/16/2024 by Valerio Melanie DASEN, NP.  Called Nurse Triage reporting Chest Pain.  Symptoms began today.  Interventions attempted: Nothing.  Symptoms are: unchanged.  Triage Disposition: Go to ED Now (Notify PCP)  Patient/caregiver understands and will follow disposition?: Yes, will follow disposition  Copied from CRM [id number]. Topic: Clinical - Red Word Triage >> Mar 20, 2024  9:31 AM Myrick T wrote: Red Word that prompted transfer to Nurse Triage: patient said she is having some chest pain in the center of her breast and pain down her right arm. She says she's been checked and they say its nothing but she says its still pain. Reason for Disposition  Pain also in shoulder(s) or arm(s) or jaw  (Exception: Pain is clearly made worse by movement.)  Answer Assessment - Initial Assessment Questions 1. LOCATION: Where does it hurt?       R arm, breast 2. RADIATION: Does the pain go anywhere else? (e.g., into neck, jaw, arms, back)     R arm into chest 3. ONSET: When did the chest pain begin? (Minutes, hours or days)      Today,  4. PATTERN: Does the pain come and go, or has it been constant since it started?  Does it get worse with exertion?      intermittent 5. DURATION: How long does it last (e.g., seconds, minutes, hours)     5 min 6. SEVERITY: How bad is the pain?  (e.g., Scale 1-10; mild, moderate, or severe)     4 7. CARDIAC RISK FACTORS: Do you have any history of heart problems or risk factors for heart disease? (e.g., angina, prior heart attack; diabetes, high blood pressure, high cholesterol, smoker, or strong family history of heart disease)     Yes, arteries 9. CAUSE: What do you think is causing the chest pain?     unsure 10. OTHER SYMPTOMS: Do you have any other symptoms? (e.g., dizziness, nausea, vomiting, sweating, fever, difficulty breathing, cough)        denies  Protocols used: Chest Pain-A-AH

## 2024-04-02 ENCOUNTER — Encounter (HOSPITAL_COMMUNITY): Payer: Self-pay

## 2024-04-03 ENCOUNTER — Telehealth: Payer: Self-pay

## 2024-04-03 NOTE — Telephone Encounter (Unsigned)
 Copied from CRM #[id number]. Topic: Clinical - Medical Advice >> Apr 03, 2024  3:30 PM Tiffany S wrote: Reason for CRM: Patient is not sure what medication she is not suppose to take before her PET scan please follow up with patient

## 2024-04-04 NOTE — Telephone Encounter (Signed)
 Called and spoke with patient. Advised her of the message in her mychart that was sent to her with instructions before her scan.

## 2024-04-05 ENCOUNTER — Ambulatory Visit
Admission: RE | Admit: 2024-04-05 | Discharge: 2024-04-05 | Disposition: A | Discharge status: Home / Self Care | Source: Ambulatory Visit | Attending: Cardiovascular Disease | Admitting: Cardiovascular Disease

## 2024-04-05 ENCOUNTER — Ambulatory Visit: Payer: Self-pay | Admitting: Cardiovascular Disease

## 2024-04-05 DIAGNOSIS — K449 Diaphragmatic hernia without obstruction or gangrene: Secondary | ICD-10-CM | POA: Insufficient documentation

## 2024-04-05 DIAGNOSIS — I700 Atherosclerosis of aorta: Secondary | ICD-10-CM | POA: Diagnosis not present

## 2024-04-05 DIAGNOSIS — K8020 Calculus of gallbladder without cholecystitis without obstruction: Secondary | ICD-10-CM | POA: Insufficient documentation

## 2024-04-05 DIAGNOSIS — R079 Chest pain, unspecified: Secondary | ICD-10-CM | POA: Diagnosis not present

## 2024-04-05 LAB — NM PET CT CARDIAC PERFUSION MULTI W/ABSOLUTE BLOODFLOW
LV dias vol: 110 mL (ref 46–106)
LV sys vol: 58 mL (ref 3.8–5.2)
MBFR: 2.14
Nuc Rest EF: 47 %
Nuc Stress EF: 57 %
Peak HR: 74 {beats}/min
Rest HR: 65 {beats}/min
Rest MBF: 0.78 ml/g/min
Rest Nuclear Isotope Dose: 24.6 mCi
SRS: 7
SSS: 16
ST Depression (mm): 0 mm
Stress MBF: 1.67 ml/g/min
Stress Nuclear Isotope Dose: 24.7 mCi
TID: 1.22

## 2024-04-05 MED ORDER — REGADENOSON 0.4 MG/5ML IV SOLN
0.4000 mg | Freq: Once | INTRAVENOUS | Status: AC
  Administered 2024-04-05: 0.4 mg via INTRAVENOUS
  Filled 2024-04-05: qty 5

## 2024-04-05 MED ORDER — RUBIDIUM RB82 GENERATOR (RUBYFILL)
25.0000 | PACK | Freq: Once | INTRAVENOUS | Status: AC
  Administered 2024-04-05: 24.63 via INTRAVENOUS

## 2024-04-05 MED ORDER — REGADENOSON 0.4 MG/5ML IV SOLN
INTRAVENOUS | Status: AC
  Filled 2024-04-05: qty 5

## 2024-04-05 MED ORDER — RUBIDIUM RB82 GENERATOR (RUBYFILL)
25.0000 | PACK | Freq: Once | INTRAVENOUS | Status: AC
  Administered 2024-04-05: 24.67 via INTRAVENOUS

## 2024-04-05 NOTE — Progress Notes (Signed)
 Patient presents for a cardiac PET stress test and tolerated procedure without incident. Patient maintained acceptable vital signs throughout the test and was offered caffeine after test.  Patient ambulated out of department with a steady gait.
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
## 2024-04-10 ENCOUNTER — Ambulatory Visit: Attending: Student | Admitting: Student

## 2024-04-10 ENCOUNTER — Encounter: Payer: Self-pay | Admitting: Student

## 2024-04-10 VITALS — BP 140/88 | HR 80 | Ht 63.0 in | Wt 225.4 lb

## 2024-04-10 DIAGNOSIS — R002 Palpitations: Secondary | ICD-10-CM | POA: Diagnosis not present

## 2024-04-10 DIAGNOSIS — I2510 Atherosclerotic heart disease of native coronary artery without angina pectoris: Secondary | ICD-10-CM

## 2024-04-10 DIAGNOSIS — I471 Supraventricular tachycardia, unspecified: Secondary | ICD-10-CM

## 2024-04-10 DIAGNOSIS — I5032 Chronic diastolic (congestive) heart failure: Secondary | ICD-10-CM

## 2024-04-10 MED ORDER — DILTIAZEM HCL 30 MG PO TABS: 30.0000 mg | ORAL_TABLET | ORAL | 0 refills | Status: AC | PRN

## 2024-04-10 NOTE — Patient Instructions (Addendum)
 Medication Instructions:  Your physician recommends that you continue on your current medications as directed. Please refer to the Current Medication list given to you today.   *If you need a refill on your cardiac medications before your next appointment, please call your pharmacy*  Lab Work: None ordered at this time  If you have labs (blood work) drawn today and your tests are completely normal, you will receive your results only by: MyChart Message (if you have MyChart) OR A paper copy in the mail If you have any lab test that is abnormal or we need to change your treatment, we will call you to review the results.  Testing/Procedures: None ordered at this time   Follow-Up: At [facility], you and your health needs are our priority.  As part of our continuing mission to provide you with exceptional heart care, our providers are all part of one team.  This team includes your primary Cardiologist (physician) and Advanced Practice Providers or APPs (Physician Assistants and Nurse Practitioners) who all work together to provide you with the care you need, when you need it.  Your next appointment:   6 month(s)  Provider:   You may see Deatrice Cage, MD or one of the following Advanced Practice Providers on your designated Care Team:   Lonni Meager, NP Lesley Maffucci, PA-C Bernardino Bring, PA-C Cadence [place], PA-C Tylene Lunch, NP Barnie Hila, NP    We recommend signing up for the patient portal called MyChart.  Sign up information is provided on this After Visit Summary.  MyChart is used to connect with patients for Virtual Visits (Telemedicine).  Patients are able to view lab/test results, encounter notes, upcoming appointments, etc.  Non-urgent messages can be sent to your provider as well.   To learn more about what you can do with MyChart, go to [email or web address].   Other Instructions  Avoid alcohol, caffeine, and exercise within 30 minutes before checking  blood pressure. Sit still, with feet flat on the floor, in a straight backed, supportive chair.  Rest for 5 minutes.  Support arm on flat surface at heart level. Bottom of cuff should be placed directly above the bend of the elbow. Take multiple readings.  Write down and bring to all follow up appointments.  Bring cuff to clinic periodically for accuracy comparison.   Please send us  the Blood Pressure readings in one week over MyChart.

## 2024-04-17 ENCOUNTER — Other Ambulatory Visit: Payer: Self-pay | Admitting: Nurse Practitioner

## 2024-04-18 NOTE — Telephone Encounter (Signed)
 Requested by interface surescripts. Future visit 05/10/24.  Requested Prescriptions  Pending Prescriptions Disp Refills   rosuvastatin  (CRESTOR ) 40 MG tablet [Pharmacy Med Name: Rosuvastatin  Calcium  40 MG Oral Tablet] 90 tablet 0    Sig: Take 1 tablet by mouth once daily     Cardiovascular:  Antilipid - Statins 2 Failed - 04/18/2024  3:21 PM      Failed - Lipid Panel in normal range within the last 12 months    Cholesterol, Total  Date Value Ref Range Status  01/16/2024 151 100 - 199 mg/dL Final   Cholesterol Piccolo, Waived  Date Value Ref Range Status  12/28/2017 206 (H) <200 mg/dL Final    Comment:                            Desirable                <200                         Borderline High      200- 239                         High                     >239    LDL Chol Calc (NIH)  Date Value Ref Range Status  01/16/2024 96 0 - 99 mg/dL Final   HDL  Date Value Ref Range Status  01/16/2024 33 (L) >39 mg/dL Final   Triglycerides  Date Value Ref Range Status  01/16/2024 120 0 - 149 mg/dL Final   Triglycerides Piccolo,Waived  Date Value Ref Range Status  12/28/2017 136 <150 mg/dL Final    Comment:                            Normal                   <150                         Borderline High     150 - 199                         High                200 - 499                         Very High                >499          Passed - Cr in normal range and within 360 days    Creat  Date Value Ref Range Status  07/23/2015 0.70 0.50 - 0.99 mg/dL Final   Creatinine, Ser  Date Value Ref Range Status  03/20/2024 0.73 0.44 - 1.00 mg/dL Final         Passed - Patient is not pregnant      Passed - Valid encounter within last 12 months    Recent Outpatient Visits           3 months ago Chronic heart failure with preserved ejection fraction [facility])   [place] [facility] [place], [place] T,  NP   5 months ago Generalized abdominal pain   [place]  [facility] [place], Melanie DASEN, NP

## 2024-05-06 NOTE — Patient Instructions (Signed)

## 2024-05-10 ENCOUNTER — Ambulatory Visit (INDEPENDENT_AMBULATORY_CARE_PROVIDER_SITE_OTHER): Admitting: Nurse Practitioner

## 2024-05-10 ENCOUNTER — Encounter: Payer: Self-pay | Admitting: Nurse Practitioner

## 2024-05-10 VITALS — BP 126/80 | HR 76 | Temp 98.2°F | Resp 17 | Ht 62.99 in | Wt 228.0 lb

## 2024-05-10 DIAGNOSIS — E782 Mixed hyperlipidemia: Secondary | ICD-10-CM

## 2024-05-10 DIAGNOSIS — R7309 Other abnormal glucose: Secondary | ICD-10-CM

## 2024-05-10 DIAGNOSIS — Z23 Encounter for immunization: Secondary | ICD-10-CM

## 2024-05-10 DIAGNOSIS — I77810 Thoracic aortic ectasia: Secondary | ICD-10-CM

## 2024-05-10 DIAGNOSIS — I25118 Atherosclerotic heart disease of native coronary artery with other forms of angina pectoris: Secondary | ICD-10-CM | POA: Diagnosis not present

## 2024-05-10 DIAGNOSIS — I5032 Chronic diastolic (congestive) heart failure: Secondary | ICD-10-CM

## 2024-05-10 DIAGNOSIS — I10 Essential (primary) hypertension: Secondary | ICD-10-CM

## 2024-05-10 LAB — BAYER DCA HB A1C WAIVED: HB A1C (BAYER DCA - WAIVED): 6.2 % — ABNORMAL HIGH (ref 4.8–5.6)

## 2024-05-10 MED ORDER — LIDOCAINE 5 % EX PTCH: 1.0000 | MEDICATED_PATCH | CUTANEOUS | 5 refills | Status: AC

## 2024-05-10 MED ORDER — PANTOPRAZOLE SODIUM 40 MG PO TBEC: 40.0000 mg | DELAYED_RELEASE_TABLET | Freq: Every day | ORAL | 2 refills | Status: AC

## 2024-05-10 MED ORDER — AMLODIPINE BESYLATE 10 MG PO TABS
10.0000 mg | ORAL_TABLET | Freq: Every day | ORAL | 4 refills | Status: AC
Start: 2024-05-10 — End: ?

## 2024-05-10 MED ORDER — EZETIMIBE 10 MG PO TABS: 10.0000 mg | ORAL_TABLET | Freq: Every day | ORAL | 3 refills | Status: AC

## 2024-05-10 MED ORDER — ISOSORBIDE MONONITRATE ER 60 MG PO TB24: 60.0000 mg | ORAL_TABLET | Freq: Every day | ORAL | 4 refills | Status: AC

## 2024-05-10 MED ORDER — HYDROCHLOROTHIAZIDE 25 MG PO TABS: 25.0000 mg | ORAL_TABLET | Freq: Every day | ORAL | 4 refills | Status: AC

## 2024-05-10 MED ORDER — CLOPIDOGREL BISULFATE 75 MG PO TABS: 75.0000 mg | ORAL_TABLET | Freq: Every day | ORAL | 4 refills | Status: AC

## 2024-05-10 MED ORDER — SPIRONOLACTONE 25 MG PO TABS: 25.0000 mg | ORAL_TABLET | ORAL | 4 refills | Status: AC

## 2024-05-10 MED ORDER — ROSUVASTATIN CALCIUM 40 MG PO TABS: 40.0000 mg | ORAL_TABLET | Freq: Every day | ORAL | 3 refills | Status: AC

## 2024-05-10 MED ORDER — NYSTATIN 100000 UNIT/GM EX CREA: 1.0000 | TOPICAL_CREAM | Freq: Two times a day (BID) | CUTANEOUS | 1 refills | Status: AC

## 2024-05-10 NOTE — Assessment & Plan Note (Signed)
Ongoing, continue collaboration with cardiology. Recent noted reviewed.

## 2024-05-10 NOTE — Assessment & Plan Note (Signed)
 BMI 40.40 with HTN, OLD MI, HLD. Recommended eating smaller high protein, low fat meals more frequently and exercising 30 mins a day 5 times a week with a goal of 10-15lb weight loss in the next 3 months. Patient voiced their understanding and motivation to adhere to these recommendations.

## 2024-05-10 NOTE — Progress Notes (Signed)
 BP 126/80 (BP Location: Left Arm, Patient Position: Sitting)   Pulse 76   Temp 98.2 F (36.8 C) (Oral)   Resp 17   Ht 5' 2.99 (1.6 m)   Wt 228 lb (103.4 kg)   LMP  (LMP Unknown)   SpO2 98%   BMI 40.40 kg/m    Subjective:    Patient ID: Jennifer Grant, female    DOB: 28-Mar-1951, 73 y.o.   MRN: [id number]  HPI: Jennifer Grant is a 73 y.o. female  Chief Complaint  Patient presents with   Congestive Heart Failure   Hypertension    Concerned with her BP. Does have some swelling in her legs and bumps.    Hyperlipidemia   Impaired fasting glucose   Back Pain    Ongoing for about a month.    HYPERTENSION / HYPERLIPIDEMIA/HF Taking Amlodipine , ASA, Cardizem , Zetia , HCTZ, Imdur , Losartan , Spironolactone  and Crestor .  Has NTG to use for chest pain. Doe report poor diet at times as enjoys cooking with spices which have lots of salt, traditional food. Saw cardiology last on 04/10/24, may need heart cath. June 2025 Echo EF 50-55%, mild LVH. History of MI in 2004. Metoprolol  in past made her feel very tired. Occasional swelling in legs reported and discoloration of veins. Satisfied with current treatment? yes Duration of hypertension: chronic BP monitoring frequency: occasionally BP range: 108/50 at home recently BP medication side effects: no Duration of hyperlipidemia: chronic Cholesterol medication side effects: no Cholesterol supplements: none Medication compliance: good compliance Aspirin : yes Recent stressors: no Recurrent headaches: no Visual changes: no Palpitations: no Dyspnea: occasional Chest pain: yes Lower extremity edema: as above Dizzy/lightheaded: no   Impaired Fasting Glucose HbA1C:  Lab Results  Component Value Date   HGBA1C 6.2 (H) 05/10/2024  Duration of elevated blood sugar: chronic Polydipsia: no Polyuria: no Weight change: no Visual disturbance: no Glucose Monitoring: no    Accucheck frequency: Not Checking    Fasting glucose:     Post  prandial:  Diabetic Education: Not Completed Family history of diabetes: yes   BACK PAIN Hit her back, sliding off chair months back. Hurting to right side. Duration: months Mechanism of injury: as above Location: Right and low back Onset: sudden Severity: 7/10 Quality: sharp, dull, aching, and throbbing Frequency: intermittent Radiation: none Aggravating factors: movement, standing for long period Alleviating factors: Tylenol , rest Status: stable Treatments attempted: Tylenol  and res, rosemary bush  Relief with NSAIDs?: No NSAIDs Taken Nighttime pain:  no Paresthesias / decreased sensation:  no Bowel / bladder incontinence:  no Fevers:  no Dysuria / urinary frequency:  no   Relevant past medical, surgical, family and social history reviewed and updated as indicated. Interim medical history since our last visit reviewed. Allergies and medications reviewed and updated.  Review of Systems  Constitutional:  Negative for activity change, appetite change, diaphoresis, fatigue and fever.  Respiratory:  Negative for cough, chest tightness, shortness of breath and wheezing.   Cardiovascular:  Negative for chest pain, palpitations and leg swelling.  Gastrointestinal: Negative.   Endocrine: Negative for cold intolerance, heat intolerance, polydipsia, polyphagia and polyuria.  Musculoskeletal:  Positive for back pain.  Neurological:  Negative for dizziness, syncope, weakness, light-headedness, numbness and headaches.  Psychiatric/Behavioral: Negative.      Per HPI unless specifically indicated above     Objective:    BP 126/80 (BP Location: Left Arm, Patient Position: Sitting)   Pulse 76   Temp 98.2 F (36.8 C) (Oral)  Resp 17   Ht 5' 2.99 (1.6 m)   Wt 228 lb (103.4 kg)   LMP  (LMP Unknown)   SpO2 98%   BMI 40.40 kg/m   Wt Readings from Last 3 Encounters:  05/10/24 228 lb (103.4 kg)  04/10/24 225 lb 6.4 oz (102.2 kg)  03/20/24 225 lb (102.1 kg)    Physical  Exam Vitals and nursing note reviewed.  Constitutional:      General: She is awake. She is not in acute distress.    Appearance: She is well-developed and well-groomed. She is obese. She is not ill-appearing or toxic-appearing.  HENT:     Head: Normocephalic.     Right Ear: Hearing and external ear normal.     Left Ear: Hearing and external ear normal.  Eyes:     General: Lids are normal.        Right eye: No discharge.        Left eye: No discharge.     Conjunctiva/sclera: Conjunctivae normal.     Pupils: Pupils are equal, round, and reactive to light.  Neck:     Thyroid : No thyromegaly.     Vascular: No carotid bruit.  Cardiovascular:     Rate and Rhythm: Normal rate and regular rhythm.     Heart sounds: Normal heart sounds. No murmur heard.    No gallop.  Pulmonary:     Effort: Pulmonary effort is normal. No accessory muscle usage or respiratory distress.     Breath sounds: Normal breath sounds.  Abdominal:     General: Bowel sounds are normal. There is no distension.     Palpations: Abdomen is soft.     Tenderness: There is no abdominal tenderness.  Musculoskeletal:     Cervical back: Normal range of motion and neck supple.     Lumbar back: No swelling, tenderness or bony tenderness. Normal range of motion. Negative right straight leg raise test and negative left straight leg raise test.     Right lower leg: No edema.     Left lower leg: No edema.     Comments: Overall ROM intact with no pain on exam and no rashes.  Lymphadenopathy:     Cervical: No cervical adenopathy.  Skin:    General: Skin is warm and dry.     Comments: Scattered, small round darker skin patches to both legs with scaling.  Neurological:     Mental Status: She is alert and oriented to person, place, and time.     Deep Tendon Reflexes: Reflexes are normal and symmetric.     Reflex Scores:      Brachioradialis reflexes are 2+ on the right side and 2+ on the left side.      Patellar reflexes are 2+ on  the right side and 2+ on the left side. Psychiatric:        Attention and Perception: Attention normal.        Mood and Affect: Mood normal.        Speech: Speech normal.        Behavior: Behavior normal. Behavior is cooperative.        Thought Content: Thought content normal.     Results for orders placed or performed in visit on 05/10/24  Bayer DCA Hb A1c Waived   Collection Time: 05/10/24  8:51 AM  Result Value Ref Range   HB A1C (BAYER DCA - WAIVED) 6.2 (H) 4.8 - 5.6 %      Assessment & Plan:  Problem List Items Addressed This Visit       Cardiovascular and Mediastinum   Essential hypertension (Chronic)   Chronic, ongoing.  BP at goal in office today on recheck.  Is working on cutting back on sodium and taking medications more consistently.  Recommend ensuring medication taken daily + checking BP at home at least a few mornings a week.  Continue collaboration with cardiology and review notes.  Continue current medication regimen at this time, suspect adjustments will need to be made in future if any elevations in BP.  Labs: CMP. Continue to collaborate with cardiology.      Relevant Medications   amLODipine  (NORVASC ) 10 MG tablet   hydrochlorothiazide  (HYDRODIURIL ) 25 MG tablet   ezetimibe  (ZETIA ) 10 MG tablet   isosorbide  mononitrate (IMDUR ) 60 MG 24 hr tablet   rosuvastatin  (CRESTOR ) 40 MG tablet   spironolactone  (ALDACTONE ) 25 MG tablet   Other Relevant Orders   Comprehensive metabolic panel with GFR   (HFpEF) heart failure with preserved ejection fraction (HCC) - Primary (Chronic)   Chronic, ongoing.  Euvolemic on exam.  Continue current medication regimen and collaboration with cardiology.  Discussed diet choices and to cut back on sodium intake, she culturally eats a higher sodium diet with lots of spicier foods.   - Reminded to call for an overnight weight gain of >2 pounds or a weekly weight weight of >5 pounds - not adding salt to his food and has been reading  food labels. Reviewed the importance of keeping daily sodium intake to 2000mg  daily  - No Ibuprofen  products      Relevant Medications   amLODipine  (NORVASC ) 10 MG tablet   hydrochlorothiazide  (HYDRODIURIL ) 25 MG tablet   ezetimibe  (ZETIA ) 10 MG tablet   isosorbide  mononitrate (IMDUR ) 60 MG 24 hr tablet   rosuvastatin  (CRESTOR ) 40 MG tablet   spironolactone  (ALDACTONE ) 25 MG tablet   Other Relevant Orders   Comprehensive metabolic panel with GFR   Coronary artery disease of native artery of native heart with stable angina pectoris (HCC)   Chronic, ongoing.  Continue current medication regimen and collaboration with cardiology.  No recent NTG use, will send in refill as needed.        Relevant Medications   amLODipine  (NORVASC ) 10 MG tablet   hydrochlorothiazide  (HYDRODIURIL ) 25 MG tablet   clopidogrel  (PLAVIX ) 75 MG tablet   ezetimibe  (ZETIA ) 10 MG tablet   isosorbide  mononitrate (IMDUR ) 60 MG 24 hr tablet   rosuvastatin  (CRESTOR ) 40 MG tablet   spironolactone  (ALDACTONE ) 25 MG tablet   Ascending aorta dilation (HCC)   Ongoing, continue collaboration with cardiology. Recent noted reviewed.      Relevant Medications   amLODipine  (NORVASC ) 10 MG tablet   hydrochlorothiazide  (HYDRODIURIL ) 25 MG tablet   ezetimibe  (ZETIA ) 10 MG tablet   isosorbide  mononitrate (IMDUR ) 60 MG 24 hr tablet   rosuvastatin  (CRESTOR ) 40 MG tablet   spironolactone  (ALDACTONE ) 25 MG tablet     Other   Hyperlipidemia (Chronic)   Chronic, ongoing.  Continue current medication regimen and adjust as needed.  Lipid panel today.  Have recommended she take medications daily to help with prevention.  Discussed this at length with patient.        Relevant Medications   amLODipine  (NORVASC ) 10 MG tablet   hydrochlorothiazide  (HYDRODIURIL ) 25 MG tablet   ezetimibe  (ZETIA ) 10 MG tablet   isosorbide  mononitrate (IMDUR ) 60 MG 24 hr tablet   rosuvastatin  (CRESTOR ) 40 MG tablet   spironolactone  (ALDACTONE ) 25  MG  tablet   Other Relevant Orders   Comprehensive metabolic panel with GFR   Lipid Panel w/o Chol/HDL Ratio   Morbid obesity (HCC)   BMI 40.40 with HTN, OLD MI, HLD. Recommended eating smaller high protein, low fat meals more frequently and exercising 30 mins a day 5 times a week with a goal of 10-15lb weight loss in the next 3 months. Patient voiced their understanding and motivation to adhere to these recommendations.       Elevated hemoglobin A1c   A1c 6.2% today which is trend up, continue Losartan  for kidney protection and recommend patient focus heavily on diet changes and regular activity. Urine ALB 07 Jan 2024. Reduce amount of sticky buns eaten.      Relevant Orders   Bayer DCA Hb A1c Waived (Completed)   Other Visit Diagnoses       Flu vaccine need       Flu vaccine in office today, educated on this.   Relevant Orders   Flu vaccine HIGH DOSE PF(Fluzone Trivalent) (Completed)        Follow up plan: Return in 6 months (on 11/07/2024) for cancel November visit -- schedule 6 months follow-up.[email or web address]

## 2024-05-10 NOTE — Assessment & Plan Note (Signed)
 A1c 6.2% today which is trend up, continue Losartan  for kidney protection and recommend patient focus heavily on diet changes and regular activity. Urine ALB 07 Jan 2024. Reduce amount of sticky buns eaten.

## 2024-05-10 NOTE — Assessment & Plan Note (Signed)
Chronic, ongoing.  Continue current medication regimen and collaboration with cardiology.  No recent NTG use, will send in refill as needed. 

## 2024-05-10 NOTE — Assessment & Plan Note (Signed)
Chronic, ongoing.  Euvolemic on exam.  Continue current medication regimen and collaboration with cardiology.  Discussed diet choices and to cut back on sodium intake, she culturally eats a higher sodium diet with lots of spicier foods.   - Reminded to call for an overnight weight gain of >2 pounds or a weekly weight weight of >5 pounds - not adding salt to his food and has been reading food labels. Reviewed the importance of keeping daily sodium intake to 2000mg  daily  - No Ibuprofen products

## 2024-05-10 NOTE — Assessment & Plan Note (Signed)
 Chronic, ongoing.  Continue current medication regimen and adjust as needed.  Lipid panel today.  Have recommended she take medications daily to help with prevention.  Discussed this at length with patient.

## 2024-05-10 NOTE — Assessment & Plan Note (Signed)
 Chronic, ongoing.  BP at goal in office today on recheck.  Is working on cutting back on sodium and taking medications more consistently.  Recommend ensuring medication taken daily + checking BP at home at least a few mornings a week.  Continue collaboration with cardiology and review notes.  Continue current medication regimen at this time, suspect adjustments will need to be made in future if any elevations in BP.  Labs: CMP. Continue to collaborate with cardiology.

## 2024-05-11 ENCOUNTER — Ambulatory Visit: Payer: Self-pay | Admitting: Nurse Practitioner

## 2024-05-11 LAB — COMPREHENSIVE METABOLIC PANEL WITH GFR
ALT: 17 IU/L (ref 0–32)
AST: 26 IU/L (ref 0–40)
Albumin: 4.4 g/dL (ref 3.8–4.8)
Alkaline Phosphatase: 79 IU/L (ref 44–121)
BUN/Creatinine Ratio: 16 (ref 12–28)
BUN: 12 mg/dL (ref 8–27)
Bilirubin Total: 0.7 mg/dL (ref 0.0–1.2)
CO2: 21 mmol/L (ref 20–29)
Calcium: 10.2 mg/dL (ref 8.7–10.3)
Chloride: 97 mmol/L (ref 96–106)
Creatinine, Ser: 0.73 mg/dL (ref 0.57–1.00)
Globulin, Total: 4 g/dL (ref 1.5–4.5)
Glucose: 89 mg/dL (ref 70–99)
Potassium: 3.9 mmol/L (ref 3.5–5.2)
Sodium: 137 mmol/L (ref 134–144)
Total Protein: 8.4 g/dL (ref 6.0–8.5)
eGFR: 86 mL/min/1.73 (ref >59)

## 2024-05-11 LAB — LIPID PANEL W/O CHOL/HDL RATIO
Cholesterol, Total: 159 mg/dL (ref 100–199)
HDL: 46 mg/dL (ref >39)
LDL Chol Calc (NIH): 99 mg/dL (ref 0–99)
Triglycerides: 71 mg/dL (ref 0–149)
VLDL Cholesterol Cal: 14 mg/dL (ref 5–40)

## 2024-05-11 NOTE — Progress Notes (Signed)
 Contacted via MyChart  Good afternoon Jennifer Grant, your labs have returned and overall are stable.  Would like to see LDL come down under 70. Please ensure you are taking Rosuvastatin  and Zetia  daily.  Any questions? Keep being amazing!!  Thank you for allowing me to participate in your care.  I appreciate you. Kindest regards, Ninfa Giannelli

## 2024-06-14 ENCOUNTER — Encounter: Payer: Self-pay | Admitting: Physician Assistant

## 2024-07-18 ENCOUNTER — Ambulatory Visit: Admitting: Nurse Practitioner

## 2024-09-14 ENCOUNTER — Encounter: Payer: Self-pay | Admitting: Nurse Practitioner

## 2024-10-09 ENCOUNTER — Ambulatory Visit: Admitting: Cardiovascular Disease

## 2024-10-18 ENCOUNTER — Ambulatory Visit

## 2024-10-26 ENCOUNTER — Ambulatory Visit: Admitting: Nurse Practitioner

## 2024-11-07 ENCOUNTER — Ambulatory Visit: Admitting: Nurse Practitioner
